# Patient Record
Sex: Female | Born: 1965 | Race: Black or African American | Hispanic: No | Marital: Married | Smoking: Never smoker
Health system: Southern US, Community
[De-identification: ages and names within clinical notes are randomized; demographics above are authoritative.]

## PROBLEM LIST (undated history)

## (undated) DIAGNOSIS — T4145XA Adverse effect of unspecified anesthetic, initial encounter: Secondary | ICD-10-CM

## (undated) DIAGNOSIS — K219 Gastro-esophageal reflux disease without esophagitis: Secondary | ICD-10-CM

## (undated) DIAGNOSIS — E041 Nontoxic single thyroid nodule: Secondary | ICD-10-CM

## (undated) DIAGNOSIS — D509 Iron deficiency anemia, unspecified: Secondary | ICD-10-CM

## (undated) DIAGNOSIS — D649 Anemia, unspecified: Secondary | ICD-10-CM

## (undated) DIAGNOSIS — N83209 Unspecified ovarian cyst, unspecified side: Secondary | ICD-10-CM

## (undated) DIAGNOSIS — E890 Postprocedural hypothyroidism: Secondary | ICD-10-CM

## (undated) DIAGNOSIS — N870 Mild cervical dysplasia: Secondary | ICD-10-CM

## (undated) DIAGNOSIS — T8859XA Other complications of anesthesia, initial encounter: Secondary | ICD-10-CM

## (undated) DIAGNOSIS — G44229 Chronic tension-type headache, not intractable: Secondary | ICD-10-CM

## (undated) DIAGNOSIS — F419 Anxiety disorder, unspecified: Secondary | ICD-10-CM

## (undated) DIAGNOSIS — G43909 Migraine, unspecified, not intractable, without status migrainosus: Secondary | ICD-10-CM

## (undated) DIAGNOSIS — R011 Cardiac murmur, unspecified: Secondary | ICD-10-CM

## (undated) DIAGNOSIS — K589 Irritable bowel syndrome without diarrhea: Secondary | ICD-10-CM

## (undated) DIAGNOSIS — D219 Benign neoplasm of connective and other soft tissue, unspecified: Secondary | ICD-10-CM

## (undated) HISTORY — PX: TUBAL LIGATION: SHX77

## (undated) HISTORY — DX: Gastro-esophageal reflux disease without esophagitis: K21.9

## (undated) HISTORY — PX: POLYPECTOMY: SHX149

## (undated) HISTORY — DX: Anemia, unspecified: D64.9

## (undated) HISTORY — DX: Anxiety disorder, unspecified: F41.9

## (undated) HISTORY — PX: NOVASURE ABLATION: SHX5394

## (undated) HISTORY — PX: ABDOMINAL HYSTERECTOMY: SHX81

## (undated) HISTORY — DX: Postprocedural hypothyroidism: E89.0

## (undated) HISTORY — PX: COLONOSCOPY: SHX174

## (undated) HISTORY — DX: Irritable bowel syndrome without diarrhea: K58.9

## (undated) HISTORY — PX: HYSTEROSCOPY: SHX211

## (undated) HISTORY — DX: Unspecified ovarian cyst, unspecified side: N83.209

## (undated) HISTORY — PX: UPPER GI ENDOSCOPY: SHX6162

## (undated) HISTORY — PX: LAPAROSCOPY: SHX197

## (undated) HISTORY — DX: Nontoxic single thyroid nodule: E04.1

## (undated) HISTORY — DX: Cardiac murmur, unspecified: R01.1

## (undated) HISTORY — DX: Benign neoplasm of connective and other soft tissue, unspecified: D21.9

## (undated) HISTORY — DX: Mild cervical dysplasia: N87.0

## (undated) HISTORY — DX: Chronic tension-type headache, not intractable: G44.229

## (undated) HISTORY — DX: Iron deficiency anemia, unspecified: D50.9

---

## 2003-01-26 ENCOUNTER — Emergency Department (HOSPITAL_COMMUNITY): Admission: EM | Admit: 2003-01-26 | Discharge: 2003-01-26 | Payer: Self-pay | Admitting: Emergency Medicine

## 2006-07-27 ENCOUNTER — Emergency Department (HOSPITAL_COMMUNITY): Admission: EM | Admit: 2006-07-27 | Discharge: 2006-07-27 | Payer: Self-pay | Admitting: Emergency Medicine

## 2006-08-07 ENCOUNTER — Ambulatory Visit (HOSPITAL_COMMUNITY): Admission: RE | Admit: 2006-08-07 | Discharge: 2006-08-07 | Payer: Self-pay | Admitting: Family Medicine

## 2006-08-20 ENCOUNTER — Encounter: Admission: RE | Admit: 2006-08-20 | Discharge: 2006-08-20 | Payer: Self-pay | Admitting: Family Medicine

## 2006-10-18 ENCOUNTER — Ambulatory Visit: Payer: Self-pay | Admitting: Gastroenterology

## 2006-10-21 ENCOUNTER — Ambulatory Visit: Payer: Self-pay | Admitting: Gastroenterology

## 2006-10-21 ENCOUNTER — Encounter (INDEPENDENT_AMBULATORY_CARE_PROVIDER_SITE_OTHER): Payer: Self-pay | Admitting: Specialist

## 2006-10-29 ENCOUNTER — Ambulatory Visit: Payer: Self-pay | Admitting: Cardiology

## 2006-12-18 ENCOUNTER — Ambulatory Visit: Payer: Self-pay | Admitting: Gastroenterology

## 2007-02-24 ENCOUNTER — Ambulatory Visit: Payer: Self-pay | Admitting: Gastroenterology

## 2008-01-03 DIAGNOSIS — K219 Gastro-esophageal reflux disease without esophagitis: Secondary | ICD-10-CM | POA: Insufficient documentation

## 2008-01-03 HISTORY — DX: Gastro-esophageal reflux disease without esophagitis: K21.9

## 2008-02-06 ENCOUNTER — Emergency Department (HOSPITAL_COMMUNITY): Admission: EM | Admit: 2008-02-06 | Discharge: 2008-02-06 | Payer: Self-pay | Admitting: Emergency Medicine

## 2008-03-08 ENCOUNTER — Emergency Department (HOSPITAL_COMMUNITY): Admission: EM | Admit: 2008-03-08 | Discharge: 2008-03-08 | Payer: Self-pay | Admitting: Emergency Medicine

## 2008-03-16 ENCOUNTER — Encounter: Admission: RE | Admit: 2008-03-16 | Discharge: 2008-03-16 | Payer: Self-pay | Admitting: Obstetrics and Gynecology

## 2008-07-01 ENCOUNTER — Emergency Department (HOSPITAL_COMMUNITY): Admission: EM | Admit: 2008-07-01 | Discharge: 2008-07-01 | Payer: Self-pay | Admitting: Family Medicine

## 2008-10-07 ENCOUNTER — Ambulatory Visit: Payer: Self-pay | Admitting: Gastroenterology

## 2008-10-15 ENCOUNTER — Telehealth: Payer: Self-pay | Admitting: Gastroenterology

## 2008-10-18 ENCOUNTER — Telehealth: Payer: Self-pay | Admitting: Gastroenterology

## 2008-11-03 ENCOUNTER — Telehealth: Payer: Self-pay | Admitting: Gastroenterology

## 2008-11-03 ENCOUNTER — Ambulatory Visit: Payer: Self-pay | Admitting: Gastroenterology

## 2008-11-12 ENCOUNTER — Telehealth: Payer: Self-pay | Admitting: Gastroenterology

## 2008-11-12 ENCOUNTER — Ambulatory Visit: Payer: Self-pay | Admitting: Gastroenterology

## 2008-11-16 LAB — CONVERTED CEMR LAB
Albumin: 3.4 g/dL — ABNORMAL LOW (ref 3.5–5.2)
Alkaline Phosphatase: 70 units/L (ref 39–117)
BUN: 12 mg/dL (ref 6–23)
Basophils Absolute: 0 10*3/uL (ref 0.0–0.1)
CO2: 28 meq/L (ref 19–32)
Eosinophils Relative: 4.5 % (ref 0.0–5.0)
GFR calc Af Amer: 101 mL/min
GFR calc non Af Amer: 83 mL/min
Lipase: 29 units/L (ref 11.0–59.0)
MCHC: 33.1 g/dL (ref 30.0–36.0)
Monocytes Relative: 8 % (ref 3.0–12.0)
Platelets: 411 10*3/uL — ABNORMAL HIGH (ref 150–400)
Potassium: 4 meq/L (ref 3.5–5.1)
RBC: 3.81 M/uL — ABNORMAL LOW (ref 3.87–5.11)
RDW: 14.6 % (ref 11.5–14.6)
TSH: 0.38 microintl units/mL (ref 0.35–5.50)
Total Bilirubin: 0.6 mg/dL (ref 0.3–1.2)

## 2008-11-17 ENCOUNTER — Ambulatory Visit: Payer: Self-pay | Admitting: Gastroenterology

## 2008-12-09 ENCOUNTER — Telehealth: Payer: Self-pay | Admitting: Gastroenterology

## 2008-12-23 ENCOUNTER — Ambulatory Visit: Payer: Self-pay | Admitting: Gastroenterology

## 2008-12-23 DIAGNOSIS — D509 Iron deficiency anemia, unspecified: Secondary | ICD-10-CM | POA: Insufficient documentation

## 2008-12-23 DIAGNOSIS — K589 Irritable bowel syndrome without diarrhea: Secondary | ICD-10-CM | POA: Insufficient documentation

## 2008-12-23 HISTORY — DX: Iron deficiency anemia, unspecified: D50.9

## 2008-12-23 HISTORY — DX: Irritable bowel syndrome, unspecified: K58.9

## 2009-01-04 ENCOUNTER — Ambulatory Visit: Payer: Self-pay | Admitting: Gastroenterology

## 2009-01-04 LAB — HM COLONOSCOPY

## 2009-01-13 ENCOUNTER — Ambulatory Visit: Payer: Self-pay | Admitting: Internal Medicine

## 2009-01-13 DIAGNOSIS — G44229 Chronic tension-type headache, not intractable: Secondary | ICD-10-CM | POA: Insufficient documentation

## 2009-01-13 HISTORY — DX: Chronic tension-type headache, not intractable: G44.229

## 2009-02-10 ENCOUNTER — Emergency Department (HOSPITAL_COMMUNITY): Admission: EM | Admit: 2009-02-10 | Discharge: 2009-02-10 | Payer: Self-pay | Admitting: Emergency Medicine

## 2009-02-17 ENCOUNTER — Ambulatory Visit: Payer: Self-pay | Admitting: Internal Medicine

## 2009-02-22 ENCOUNTER — Ambulatory Visit: Payer: Self-pay | Admitting: Gastroenterology

## 2009-02-23 LAB — CONVERTED CEMR LAB
Basophils Absolute: 0 10*3/uL (ref 0.0–0.1)
HCT: 31.1 % — ABNORMAL LOW (ref 36.0–46.0)
Hemoglobin: 10.3 g/dL — ABNORMAL LOW (ref 12.0–15.0)
Lymphocytes Relative: 49.8 % — ABNORMAL HIGH (ref 12.0–46.0)
MCV: 83.4 fL (ref 78.0–100.0)
Neutro Abs: 1.8 10*3/uL (ref 1.4–7.7)
Neutrophils Relative %: 33.3 % — ABNORMAL LOW (ref 43.0–77.0)
Platelets: 336 10*3/uL (ref 150.0–400.0)
RDW: 14.6 % (ref 11.5–14.6)

## 2009-03-01 ENCOUNTER — Encounter: Payer: Self-pay | Admitting: Internal Medicine

## 2009-03-02 ENCOUNTER — Telehealth: Payer: Self-pay | Admitting: Gastroenterology

## 2009-03-08 ENCOUNTER — Encounter: Admission: RE | Admit: 2009-03-08 | Discharge: 2009-03-08 | Payer: Self-pay | Admitting: Neurology

## 2009-03-15 LAB — CONVERTED CEMR LAB
Ferritin: 6.7 ng/mL — ABNORMAL LOW (ref 10.0–291.0)
Folate: 14.4 ng/mL
Saturation Ratios: 4.4 % — ABNORMAL LOW (ref 20.0–50.0)
Vitamin B-12: 369 pg/mL (ref 211–911)

## 2009-03-16 ENCOUNTER — Encounter: Admission: RE | Admit: 2009-03-16 | Discharge: 2009-03-16 | Payer: Self-pay | Admitting: Neurology

## 2009-03-16 ENCOUNTER — Encounter: Payer: Self-pay | Admitting: Internal Medicine

## 2009-03-17 ENCOUNTER — Encounter: Payer: Self-pay | Admitting: Internal Medicine

## 2009-03-22 ENCOUNTER — Ambulatory Visit (HOSPITAL_COMMUNITY): Admission: RE | Admit: 2009-03-22 | Discharge: 2009-03-22 | Payer: Self-pay | Admitting: Obstetrics and Gynecology

## 2009-04-07 ENCOUNTER — Encounter: Payer: Self-pay | Admitting: Internal Medicine

## 2009-04-13 ENCOUNTER — Emergency Department (HOSPITAL_COMMUNITY): Admission: EM | Admit: 2009-04-13 | Discharge: 2009-04-13 | Payer: Self-pay | Admitting: Family Medicine

## 2009-04-15 ENCOUNTER — Ambulatory Visit: Payer: Self-pay | Admitting: Internal Medicine

## 2009-04-21 ENCOUNTER — Ambulatory Visit: Payer: Self-pay | Admitting: Internal Medicine

## 2009-04-21 DIAGNOSIS — E041 Nontoxic single thyroid nodule: Secondary | ICD-10-CM | POA: Insufficient documentation

## 2009-04-21 HISTORY — DX: Nontoxic single thyroid nodule: E04.1

## 2009-04-21 LAB — CONVERTED CEMR LAB
Basophils Absolute: 0.3 10*3/uL — ABNORMAL HIGH (ref 0.0–0.1)
Basophils Relative: 3.9 % — ABNORMAL HIGH (ref 0.0–3.0)
Eosinophils Absolute: 0.3 10*3/uL (ref 0.0–0.7)
Folate: 11.6 ng/mL
HCT: 38.8 % (ref 36.0–46.0)
Hemoglobin: 12.5 g/dL (ref 12.0–15.0)
Iron: 41 ug/dL — ABNORMAL LOW (ref 42–145)
Lymphs Abs: 3.2 10*3/uL (ref 0.7–4.0)
MCV: 85.3 fL (ref 78.0–100.0)
Monocytes Absolute: 0.4 10*3/uL (ref 0.1–1.0)
Monocytes Relative: 5.6 % (ref 3.0–12.0)
Neutrophils Relative %: 39.8 % — ABNORMAL LOW (ref 43.0–77.0)
Platelets: 494 10*3/uL — ABNORMAL HIGH (ref 150.0–400.0)
T3 Uptake Ratio: 39.2 % — ABNORMAL HIGH (ref 22.5–37.0)
TSH: 0.31 microintl units/mL — ABNORMAL LOW (ref 0.35–5.50)
Vitamin B-12: 391 pg/mL (ref 211–911)
WBC: 7 10*3/uL (ref 4.5–10.5)

## 2009-04-23 ENCOUNTER — Encounter: Payer: Self-pay | Admitting: Internal Medicine

## 2009-04-26 ENCOUNTER — Ambulatory Visit (HOSPITAL_COMMUNITY): Admission: RE | Admit: 2009-04-26 | Discharge: 2009-04-26 | Payer: Self-pay | Admitting: Internal Medicine

## 2009-04-27 ENCOUNTER — Emergency Department (HOSPITAL_COMMUNITY): Admission: EM | Admit: 2009-04-27 | Discharge: 2009-04-27 | Payer: Self-pay | Admitting: Emergency Medicine

## 2009-04-27 ENCOUNTER — Telehealth: Payer: Self-pay | Admitting: Internal Medicine

## 2009-05-12 ENCOUNTER — Ambulatory Visit: Payer: Self-pay | Admitting: Internal Medicine

## 2009-05-17 ENCOUNTER — Emergency Department (HOSPITAL_COMMUNITY): Admission: EM | Admit: 2009-05-17 | Discharge: 2009-05-17 | Payer: Self-pay | Admitting: Emergency Medicine

## 2009-05-24 ENCOUNTER — Ambulatory Visit: Payer: Self-pay | Admitting: Endocrinology

## 2009-05-26 ENCOUNTER — Telehealth: Payer: Self-pay | Admitting: Internal Medicine

## 2009-05-26 ENCOUNTER — Telehealth: Payer: Self-pay | Admitting: Endocrinology

## 2009-05-27 ENCOUNTER — Telehealth: Payer: Self-pay | Admitting: Endocrinology

## 2009-05-31 ENCOUNTER — Ambulatory Visit: Payer: Self-pay | Admitting: Endocrinology

## 2009-06-06 ENCOUNTER — Encounter (HOSPITAL_COMMUNITY): Admission: RE | Admit: 2009-06-06 | Discharge: 2009-08-12 | Payer: Self-pay | Admitting: Endocrinology

## 2009-06-09 ENCOUNTER — Telehealth: Payer: Self-pay | Admitting: Endocrinology

## 2009-06-10 ENCOUNTER — Encounter (INDEPENDENT_AMBULATORY_CARE_PROVIDER_SITE_OTHER): Payer: Self-pay | Admitting: *Deleted

## 2009-06-20 ENCOUNTER — Telehealth: Payer: Self-pay | Admitting: Endocrinology

## 2009-06-23 ENCOUNTER — Ambulatory Visit: Payer: Self-pay | Admitting: Internal Medicine

## 2009-06-30 ENCOUNTER — Telehealth: Payer: Self-pay | Admitting: Endocrinology

## 2009-07-01 ENCOUNTER — Ambulatory Visit: Payer: Self-pay | Admitting: Endocrinology

## 2009-07-04 LAB — CONVERTED CEMR LAB
Free T4: 1.9 ng/dL — ABNORMAL HIGH (ref 0.6–1.6)
TSH: 0.06 microintl units/mL — ABNORMAL LOW (ref 0.35–5.50)

## 2009-07-26 ENCOUNTER — Encounter: Payer: Self-pay | Admitting: Internal Medicine

## 2009-08-12 ENCOUNTER — Ambulatory Visit: Payer: Self-pay | Admitting: Endocrinology

## 2009-08-12 DIAGNOSIS — E890 Postprocedural hypothyroidism: Secondary | ICD-10-CM

## 2009-08-12 DIAGNOSIS — E039 Hypothyroidism, unspecified: Secondary | ICD-10-CM | POA: Insufficient documentation

## 2009-08-12 HISTORY — DX: Postprocedural hypothyroidism: E89.0

## 2009-08-12 LAB — CONVERTED CEMR LAB: Free T4: 0.5 ng/dL — ABNORMAL LOW (ref 0.6–1.6)

## 2009-08-31 ENCOUNTER — Telehealth: Payer: Self-pay | Admitting: Endocrinology

## 2009-09-01 ENCOUNTER — Ambulatory Visit: Payer: Self-pay | Admitting: Endocrinology

## 2009-09-01 LAB — CONVERTED CEMR LAB
Free T4: 0.8 ng/dL (ref 0.6–1.6)
TSH: 3.81 microintl units/mL (ref 0.35–5.50)

## 2009-10-13 ENCOUNTER — Ambulatory Visit: Payer: Self-pay | Admitting: Endocrinology

## 2009-10-14 LAB — CONVERTED CEMR LAB: TSH: 1.57 microintl units/mL (ref 0.35–5.50)

## 2009-11-24 ENCOUNTER — Encounter: Admission: RE | Admit: 2009-11-24 | Discharge: 2009-11-24 | Payer: Self-pay | Admitting: Neurology

## 2009-12-15 ENCOUNTER — Ambulatory Visit: Payer: Self-pay | Admitting: Endocrinology

## 2009-12-29 ENCOUNTER — Ambulatory Visit: Payer: Self-pay | Admitting: Internal Medicine

## 2010-03-21 ENCOUNTER — Ambulatory Visit (HOSPITAL_COMMUNITY): Admission: RE | Admit: 2010-03-21 | Discharge: 2010-03-21 | Payer: Self-pay | Admitting: Obstetrics and Gynecology

## 2010-03-22 ENCOUNTER — Ambulatory Visit: Payer: Self-pay | Admitting: Gastroenterology

## 2010-03-23 ENCOUNTER — Telehealth: Payer: Self-pay | Admitting: Gastroenterology

## 2010-03-31 ENCOUNTER — Encounter: Payer: Self-pay | Admitting: Gastroenterology

## 2010-04-18 ENCOUNTER — Ambulatory Visit: Payer: Self-pay | Admitting: Endocrinology

## 2010-04-19 LAB — CONVERTED CEMR LAB: TSH: 1.17 microintl units/mL (ref 0.35–5.50)

## 2010-04-20 ENCOUNTER — Ambulatory Visit (HOSPITAL_COMMUNITY): Admission: RE | Admit: 2010-04-20 | Discharge: 2010-04-20 | Payer: Self-pay | Admitting: Obstetrics and Gynecology

## 2010-04-25 ENCOUNTER — Emergency Department (HOSPITAL_COMMUNITY): Admission: EM | Admit: 2010-04-25 | Discharge: 2010-04-25 | Payer: Self-pay | Admitting: Family Medicine

## 2010-07-05 ENCOUNTER — Encounter: Payer: Self-pay | Admitting: Internal Medicine

## 2010-07-07 ENCOUNTER — Ambulatory Visit: Payer: Self-pay | Admitting: Internal Medicine

## 2010-07-07 ENCOUNTER — Encounter: Payer: Self-pay | Admitting: Internal Medicine

## 2010-07-07 DIAGNOSIS — R011 Cardiac murmur, unspecified: Secondary | ICD-10-CM | POA: Insufficient documentation

## 2010-07-07 DIAGNOSIS — R55 Syncope and collapse: Secondary | ICD-10-CM | POA: Insufficient documentation

## 2010-07-07 HISTORY — DX: Cardiac murmur, unspecified: R01.1

## 2010-07-07 LAB — CONVERTED CEMR LAB
ALT: 14 units/L (ref 0–35)
AST: 20 units/L (ref 0–37)
Albumin: 3.6 g/dL (ref 3.5–5.2)
Alkaline Phosphatase: 67 units/L (ref 39–117)
BUN: 10 mg/dL (ref 6–23)
Basophils Absolute: 0 10*3/uL (ref 0.0–0.1)
Calcium: 9.2 mg/dL (ref 8.4–10.5)
Creatinine, Ser: 0.9 mg/dL (ref 0.4–1.2)
Eosinophils Relative: 4.7 % (ref 0.0–5.0)
HCT: 29.9 % — ABNORMAL LOW (ref 36.0–46.0)
Hemoglobin: 9.9 g/dL — ABNORMAL LOW (ref 12.0–15.0)
Iron: 20 ug/dL — ABNORMAL LOW (ref 42–145)
Lymphocytes Relative: 23.7 % (ref 12.0–46.0)
Monocytes Relative: 7.3 % (ref 3.0–12.0)
Neutro Abs: 3.2 10*3/uL (ref 1.4–7.7)
RBC: 3.66 M/uL — ABNORMAL LOW (ref 3.87–5.11)
RDW: 16.9 % — ABNORMAL HIGH (ref 11.5–14.6)
Total Bilirubin: 0.5 mg/dL (ref 0.3–1.2)
Transferrin: 390.6 mg/dL — ABNORMAL HIGH (ref 212.0–360.0)
WBC: 5 10*3/uL (ref 4.5–10.5)

## 2010-07-09 ENCOUNTER — Encounter: Payer: Self-pay | Admitting: Internal Medicine

## 2010-07-10 ENCOUNTER — Telehealth: Payer: Self-pay | Admitting: Internal Medicine

## 2010-07-18 ENCOUNTER — Encounter: Payer: Self-pay | Admitting: Internal Medicine

## 2010-07-18 ENCOUNTER — Ambulatory Visit: Payer: Self-pay

## 2010-07-18 ENCOUNTER — Ambulatory Visit: Payer: Self-pay | Admitting: Cardiology

## 2010-07-18 ENCOUNTER — Ambulatory Visit (HOSPITAL_COMMUNITY): Admission: RE | Admit: 2010-07-18 | Discharge: 2010-07-18 | Payer: Self-pay | Admitting: Internal Medicine

## 2010-07-24 ENCOUNTER — Telehealth: Payer: Self-pay | Admitting: Internal Medicine

## 2010-08-17 ENCOUNTER — Emergency Department (HOSPITAL_COMMUNITY)
Admission: EM | Admit: 2010-08-17 | Discharge: 2010-08-18 | Payer: Self-pay | Source: Home / Self Care | Admitting: Emergency Medicine

## 2010-09-30 ENCOUNTER — Encounter: Payer: Self-pay | Admitting: Endocrinology

## 2010-10-10 NOTE — Progress Notes (Signed)
Summary: speak to nuerse  Medications Added BENTYL 10 MG CAPS (DICYCLOMINE HCL) one capsule by mouth four times a day as needed       Phone Note Call from Patient Call back at Home Phone [phone]   Caller: Patient Call For: Russella Dar Reason for Call: Talk to Nurse Summary of Call: Patient wants to speak to nurse regarding meds Initial call taken by: Tawni Levy,  March 23, 2010 1:26 PM  Follow-up for Phone Call        Pt states she took one pill yesterday of Levbid and had this terrible heahache afterwards and she could not keep taking it b/c she states she already gets migraines. Pt would like to be switched to another antispasmodic. I know she has tried robinul in the past. please advise. Follow-up by: Christie Nottingham CMA Duncan Dull),  March 23, 2010 2:13 PM  Additional Follow-up for Phone Call Additional follow up Details #1::        Bentyl 10mg  qid as needed #100, 11 refills DC Levbid Additional Follow-up by: Meryl Dare MD Clementeen Graham,  March 23, 2010 2:17 PM    Additional Follow-up for Phone Call Additional follow up Details #2::    Rx was sent to pts pharmacy.  Follow-up by: Christie Nottingham CMA Duncan Dull),  March 23, 2010 2:33 PM  New/Updated Medications: BENTYL 10 MG CAPS (DICYCLOMINE HCL) one capsule by mouth four times a day as needed Prescriptions: BENTYL 10 MG CAPS (DICYCLOMINE HCL) one capsule by mouth four times a day as needed  #100 x 11   Entered by:   Christie Nottingham CMA (AAMA)   Authorized by:   Meryl Dare MD [facility]   Signed by:   Christie Nottingham CMA Duncan Dull) on 03/23/2010   Method used:   Electronically to        [facility]* (retail)       [address]       [address] [place] [address]mailing       [place], [place]  [address]       Ph: [phone]       Fax: [phone]   RxID:   [id number]

## 2010-10-10 NOTE — Progress Notes (Signed)
  Phone Note Call from Patient   Caller: Patient Summary of Call: pt called to get lab results.  Initial call taken by: Alysia Penna,  July 10, 2010 2:28 PM  Follow-up for Phone Call        called pt back & gave results.  Follow-up by: Alysia Penna,  July 10, 2010 2:28 PM

## 2010-10-10 NOTE — Assessment & Plan Note (Signed)
Summary: gerd/lk   History of Present Illness Visit Type: Follow-up Visit Primary GI MD: Elie Goody MD [facility] Primary Provider: Etta Grandchild MD Chief Complaint: GERD, nausea, some abdominal pain and gas x 2 weeks.   History of Present Illness:   Chelsey Hunt complains of worsening abdominal bloating with intermittent abdominal pains in her right and left abdomen, associated with nausea, regurgitation, and worsening reflux symptoms for the past few months. She states her constipation is under good control. Her symptoms are not necessarily related to meals or bowel movements. They tend to be less bothersome in the morning, and worsen as the day progresses.   GI Review of Systems    Reports abdominal pain, belching, bloating, heartburn, and  nausea.      Denies acid reflux, chest pain, dysphagia with liquids, dysphagia with solids, loss of appetite, vomiting, vomiting blood, weight loss, and  weight gain.      Reports black tarry stools.     Denies anal fissure, change in bowel habit, constipation, diarrhea, diverticulosis, fecal incontinence, heme positive stool, hemorrhoids, irritable bowel syndrome, jaundice, light color stool, liver problems, rectal bleeding, and  rectal pain.   Current Medications (verified): 1)  Kapidex 60 Mg Cpdr (Dexlansoprazole) .... One Tablet By Mouth Once Daily 2)  Vitamin B-12 500 Mcg Tabs (Cyanocobalamin) .... Once Daily 3)  Cvs Gas Relief Ultra Strength 180 Mg Caps (Simethicone) .... As Needed 4)  Feosol 200 (65 Fe) Mg Tabs (Ferrous Sulfate Dried) .... One By Mouth Three Times A Day With Meals 5)  Promethazine Hcl 25 Mg Tabs (Promethazine Hcl) .... Q4hrs As Needed For Nausea 6)  Zantac Otc 7)  Zyrtec Allergy 10 Mg Tabs (Cetirizine Hcl) .... As Needed Allergies 8)  Relpax 40 Mg Tabs (Eletriptan Hydrobromide) .Marland Kitchen.. 1 Tab By Mouth For Migraine, May Repeat in 2 Hours 9)  Imipramine Hcl 10 Mg Tabs (Imipramine Hcl) .Marland Kitchen.. 1 Tab By Mouth @ Bedtime May Increase By 1  Tab Every 5-7- Days Prn 10)  Alprazolam 0.25 Mg Tabs (Alprazolam) .Marland Kitchen.. 1 Q6h As Needed Thyroid Symptoms 11)  Estrogel 0.025 12)  Levothyroxine Sodium 75 Mcg Tabs (Levothyroxine Sodium) .Marland Kitchen.. 1 Qd 13)  Biotin .Marland Kitchen.. 2-3 Daily 14)  Vicodin  Allergies (verified): 1)  ! Darvocet 2)  ! * Topamax 3)  ! Relafen  Past History:  Past Medical History: Reviewed history from 12/23/2008 and no changes required. GERD Ovarian cyst Allergies Headaches IBS-C  Past Surgical History: Reviewed history from 11/03/2008 and no changes required. Tubal Ligation  Family History: Reviewed history from 05/24/2009 and no changes required. 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  Alcohol Use - no Illicit Drug Use - no Married Regular exercise-yes  Review of Systems       The patient complains of back pain, headaches-new, and shortness of breath.         The pertinent positives and negatives are noted as above and in the HPI. All other ROS were reviewed and were negative.   Vital Signs:  Patient profile:   45 year old female Height:      60 inches Weight:      130 pounds BMI:     25.48 Pulse rate:   96 / minute Pulse rhythm:   regular BP sitting:   108 / 72  (left arm)  Vitals Entered  By: Milford Cage NCMA (March 22, 2010 2:40 PM)  Physical Exam  General:  Well developed, well nourished, no acute distress. Head:  Normocephalic and atraumatic. Eyes:  PERRLA, no icterus. Ears:  Normal auditory acuity. Mouth:  No deformity or lesions, dentition normal. Lungs:  Clear throughout to auscultation. Heart:  Regular rate and rhythm; no murmurs, rubs,  or bruits. Abdomen:  Soft, nontender and nondistended. No masses, hepatosplenomegaly or hernias noted. Normal bowel sounds. Psych:  Alert and cooperative. Normal mood and  affect.  Impression & Recommendations:  Problem # 1:  IRRITABLE BOWEL SYNDROME (ICD-564.1) I suspect the majority of her symptoms are related to irritable bowel syndrome although she does not have typical changes associated with bowel movements. Begin Levbid twice daily as she felt the probe in all did not adequately address her symptoms. In the past. Begin low gas diet and increase the use of OTC gas relief medication to three times a day.  Problem # 2:  ACID REFLUX DISEASE (ICD-530.81) mild flare of GERD, likely related to irritable bowel syndrome activity. Reintense antireflux measures and continue on Dexilant.  Patient Instructions: 1)  Pick up your prescription from your pharmacy.  2)  Avoid foods high in acid content ( tomatoes, citrus juices, spicy foods) . Avoid eating within 3 to 4 hours of lying down or before exercising. Do not over eat; try smaller more frequent meals. Elevate head of bed four inches when sleeping.  3)  Excessive Gas Diet handout given.  4)  Copy sent to : Etta Grandchild, MD 5)  The medication list was reviewed and reconciled.  All changed / newly prescribed medications were explained.  A complete medication list was provided to the patient / caregiver.  Prescriptions: LEVBID 0.375 MG XR12H-TAB (HYOSCYAMINE SULFATE) one tablet by mouth two times a day  #60 x 11   Entered by:   Christie Nottingham CMA (AAMA)   Authorized by:   Meryl Dare MD [facility]   Signed by:   Christie Nottingham CMA Duncan Dull) on 03/22/2010   Method used:   Electronically to        [facility]* (retail)       [address]       [address] [place] [address]mail[address]       [place], [place]  [address]       Ph: [phone]       Fax: [phone]   RxID:   [id number]

## 2010-10-10 NOTE — Letter (Signed)
Summary: Results Follow-up Letter  [facility]  [address] [place], [place] [address]   Phone: [phone]  Fax: [phone]    07/09/2010  [address] [place], [place]  [address]  Dear Ms. Denny Peon,   The following are the results of your recent test(s):  Test     Result     CBC       anemia Iron level     low B12 level     normal Liver/kidney   normal Thyroid     normal   _________________________________________________________  Please call for an appointment soon _________________________________________________________ _________________________________________________________ _________________________________________________________  Sincerely,  Sanda Linger MD [facility]

## 2010-10-10 NOTE — Miscellaneous (Signed)
Summary: Appointment Canceled  Appointment status changed to canceled by [employer] on 07/17/2010 5:00 PM.  Cancellation Comments --------------------- echo/ heart mumur. pt has umr. gd  Appointment Information ----------------------- Appt Type:  CARDIOLOGY ANCILLARY VISIT      Date:  Tuesday, July 18, 2010      Time:  7:30 AM for 60 min   Urgency:  Routine   Made By:  Hoy Finlay Scheduler  To Visit:  [id number]    Reason:  echo/ heart mumur. pt has umr. gd  Appt Comments ------------- -- 07/17/10 17:00: ([facility]) CANCELED -- echo/ heart mumur. pt has umr. gd -- 07/07/10 9:54: ([facility]) BOOKED -- Routine CARDIOLOGY ANCILLARY VISIT at 07/18/2010 7:30 AM for 60 min echo/ heart mumur. pt has umr. gd

## 2010-10-10 NOTE — Assessment & Plan Note (Signed)
Summary: HEADACHE MD TOLD PT TO SEE PC PHY--LIGHTHEADNESS--STC   Vital Signs:  Patient profile:   45 year old female Height:      60 inches Weight:      136 pounds BMI:     26.66 O2 Sat:      97 % on Room air Temp:     98.1 degrees F oral Pulse rate:   80 / minute Pulse rhythm:   regular Resp:     16 per minute BP sitting:   120 / 80  (left arm) Cuff size:   large  Vitals Entered By: Rock Nephew CMA (July 07, 2010 8:12 AM)  Nutrition Counseling: Patient's BMI is greater than 25 and therefore counseled on weight management options.  O2 Flow:  Room air CC: syncope   Primary Care Erico Stan:  Etta Grandchild MD  CC:  syncope.  History of Present Illness: She returns for f/up and says that she had a syncopal episode 2 weeks ago and she says that she saw Dr. Vela Prose yesterday about her headaches and he sent her here for evaluation of the syncope. She was at work and felt hot so one  of the nurses checked her BP and she says that it was 120/80 and she then slid down to the floor but she knows that she was still conscious because she could here everyone talking and she did not have a post-ictal period. She says that she went home from work but that she was not seen by a doctor. She has not had anymore syncope or near-syncope since then. She has had mild nausea but no vomiting. She is dressed today to attend a funeral.  Preventive Screening-Counseling & Management  Alcohol-Tobacco     Alcohol drinks/day: 0     Alcohol Counseling: not indicated; patient does not drink     Smoking Status: never     Tobacco Counseling: not indicated; no tobacco use  Hep-HIV-STD-Contraception     Hepatitis Risk: no risk noted     HIV Risk: no risk noted     STD Risk: no risk noted      Sexual History:  currently monogamous.        Drug Use:  never.        Blood Transfusions:  no.    Medications Prior to Update: 1)  Dexilant 60 Mg Cpdr (Dexlansoprazole) .... One Capsule By Mouth Once  Daily 2)  Vitamin B-12 500 Mcg Tabs (Cyanocobalamin) .... Once Daily 3)  Cvs Gas Relief Ultra Strength 180 Mg Caps (Simethicone) .... As Needed 4)  Feosol 200 (65 Fe) Mg Tabs (Ferrous Sulfate Dried) .... One By Mouth Three Times A Day With Meals 5)  Promethazine Hcl 25 Mg Tabs (Promethazine Hcl) .... Q4hrs As Needed For Nausea 6)  Zantac Otc 7)  Zyrtec Allergy 10 Mg Tabs (Cetirizine Hcl) .... As Needed Allergies 8)  Relpax 40 Mg Tabs (Eletriptan Hydrobromide) .Marland Kitchen.. 1 Tab By Mouth For Migraine, May Repeat in 2 Hours 9)  Imipramine Hcl 10 Mg Tabs (Imipramine Hcl) .Marland Kitchen.. 1 Tab By Mouth @ Bedtime May Increase By 1 Tab Every 5-7- Days Prn 10)  Alprazolam 0.25 Mg Tabs (Alprazolam) .Marland Kitchen.. 1 Q6h As Needed Thyroid Symptoms 11)  Estrogel 0.025 12)  Levothyroxine Sodium 75 Mcg Tabs (Levothyroxine Sodium) .Marland Kitchen.. 1 Qd 13)  Biotin .Marland Kitchen.. 2-3 Daily 14)  Vicodin 15)  Bentyl 10 Mg Caps (Dicyclomine Hcl) .... One Capsule By Mouth Four Times A Day As Needed  Current Medications (verified):  1)  Dexilant 60 Mg Cpdr (Dexlansoprazole) .... One Capsule By Mouth Once Daily 2)  Vitamin B-12 500 Mcg Tabs (Cyanocobalamin) .... Once Daily 3)  Cvs Gas Relief Ultra Strength 180 Mg Caps (Simethicone) .... As Needed 4)  Feosol 200 (65 Fe) Mg Tabs (Ferrous Sulfate Dried) .... One By Mouth Three Times A Day With Meals 5)  Promethazine Hcl 25 Mg Tabs (Promethazine Hcl) .... Q4hrs As Needed For Nausea 6)  Zantac Otc 7)  Zyrtec Allergy 10 Mg Tabs (Cetirizine Hcl) .... As Needed Allergies 8)  Relpax 40 Mg Tabs (Eletriptan Hydrobromide) .Marland Kitchen.. 1 Tab By Mouth For Migraine, May Repeat in 2 Hours 9)  Imipramine Hcl 10 Mg Tabs (Imipramine Hcl) .Marland Kitchen.. 1 Tab By Mouth @ Bedtime May Increase By 1 Tab Every 5-7- Days Prn 10)  Alprazolam 0.25 Mg Tabs (Alprazolam) .Marland Kitchen.. 1 Q6h As Needed Thyroid Symptoms 11)  Estrogel 0.025 12)  Levothyroxine Sodium 75 Mcg Tabs (Levothyroxine Sodium) .Marland Kitchen.. 1 Qd 13)  Biotin .Marland Kitchen.. 2-3 Daily 14)  Vicodin 15)  Bentyl 10 Mg  Caps (Dicyclomine Hcl) .... One Capsule By Mouth Four Times A Day As Needed  Allergies (verified): 1)  ! Darvocet 2)  ! * Topamax 3)  ! 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  Alcohol Use - no Illicit Drug Use - no Married Regular exercise-yes  Risk Factors: Alcohol Use: 0 (07/07/2010) Exercise: yes (01/13/2009)  Risk Factors: Smoking Status: never (07/07/2010)  Family History: Reviewed history from 05/24/2009 and no changes required. 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  Alcohol Use - no Illicit Drug Use - no Married Regular exercise-yes  Review of Systems       The patient complains of syncope and headaches.  The patient denies anorexia, fever, weight loss, weight gain, vision loss, decreased hearing, chest pain, dyspnea on exertion, peripheral edema, prolonged cough, hemoptysis, abdominal pain, melena, hematochezia, severe indigestion/heartburn, hematuria, suspicious skin lesions, transient blindness, difficulty walking, depression, and enlarged lymph nodes.   CV:  Complains of fainting; denies bluish discoloration of lips or nails, chest pain or discomfort, difficulty breathing at night, difficulty breathing while lying down, fatigue, leg cramps with exertion, lightheadness, near fainting, palpitations, shortness of breath with exertion, swelling of feet, and weight gain. Neuro:  Complains of falling down and headaches; denies brief paralysis,  difficulty with concentration, disturbances in coordination, inability to speak, memory loss, numbness, poor balance, seizures, sensation of room spinning, tingling, tremors, visual disturbances, and weakness. Endo:  Denies cold intolerance, excessive hunger, excessive thirst, excessive urination, heat intolerance, polyuria, and weight change. Heme:  Denies abnormal bruising, bleeding, enlarge lymph nodes, fevers, pallor, and skin discoloration.  Physical Exam  General:  alert, well-developed, well-nourished, well-hydrated, appropriate dress, normal appearance, healthy-appearing, cooperative to examination, and good hygiene.   Head:  normocephalic, atraumatic, no abnormalities observed, and no abnormalities palpated.   Eyes:  vision grossly intact, pupils equal, pupils round, pupils reactive to light, pupils react to accomodation, no injection, and no nystagmus.   Ears:  R ear normal and L  ear normal.   Nose:  External nasal examination shows no deformity or inflammation. Nasal mucosa are pink and moist without lesions or exudates. Mouth:  Oral mucosa and oropharynx without lesions or exudates.  Teeth in good repair. Neck:  supple, full ROM, no masses, no thyromegaly, no thyroid nodules or tenderness, no JVD, normal carotid upstroke, and no cervical lymphadenopathy.   Lungs:  Normal respiratory effort, chest expands symmetrically. Lungs are clear to auscultation, no crackles or wheezes. Heart:  normal rate, regular rhythm, no gallop, no rub, and Grade  1/6 systolic ejection murmur over LLSB. Abdomen:  soft, non-tender, normal bowel sounds, no distention, no masses, no guarding, no rigidity, no rebound tenderness, no abdominal hernia, no inguinal hernia, no hepatomegaly, and no splenomegaly.   Msk:  normal ROM, no joint tenderness, no joint swelling, no joint warmth, no redness over joints, no joint deformities, no joint instability, and no crepitation.   Pulses:  R and L  carotid,radial,femoral,dorsalis pedis and posterior tibial pulses are full and equal bilaterally Extremities:  No clubbing, cyanosis, edema, or deformity noted with normal full range of motion of all joints.   Neurologic:  No cranial nerve deficits noted. Station and gait are normal. Plantar reflexes are down-going bilaterally. DTRs are symmetrical throughout. Sensory, motor and coordinative functions appear intact. Skin:  turgor normal, color normal, no rashes, no suspicious lesions, no ecchymoses, no petechiae, no purpura, no ulcerations, and no edema.   Cervical Nodes:  no anterior cervical adenopathy and no posterior cervical adenopathy.   Axillary Nodes:  no R axillary adenopathy and no L axillary adenopathy.   Inguinal Nodes:  no R inguinal adenopathy and no L inguinal adenopathy.   Psych:  Cognition and judgment appear intact. Alert and cooperative with normal attention span and concentration. No apparent delusions, illusions, hallucinations Additional Exam:  EKG is normal.   Impression & Recommendations:  Problem # 1:  HEART MURMUR, SYSTOLIC (ICD-785.2) Assessment New I will get an Echo done to see if she has any significant valvular disease Orders: Echo Referral (Echo) TLB-BMP (Basic Metabolic Panel-BMET) (80048-METABOL) TLB-CBC Platelet - w/Differential (85025-CBCD) TLB-Hepatic/Liver Function Pnl (80076-HEPATIC) TLB-TSH (Thyroid Stimulating Hormone) (84443-TSH) TLB-B12 + Folate Pnl ([id number]/HYQ) TLB-IBC Pnl (Iron/FE;Transferrin) (83550-IBC) T-D-Dimer Fibrin Derivatives Quantitive [phone]) EKG w/ Interpretation (93000)  Problem # 2:  SYNCOPE (ICD-780.2) Assessment: New as above, I think this was probably a benign event Orders: Echo Referral (Echo) TLB-BMP (Basic Metabolic Panel-BMET) (80048-METABOL) TLB-CBC Platelet - w/Differential (85025-CBCD) TLB-Hepatic/Liver Function Pnl (80076-HEPATIC) TLB-TSH (Thyroid Stimulating Hormone) (84443-TSH) TLB-B12 + Folate  Pnl ([id number]-N02/VOZ) TLB-IBC Pnl (Iron/FE;Transferrin) (83550-IBC) T-D-Dimer Fibrin Derivatives Quantitive ([phone]) EKG w/ Interpretation (93000)  Problem # 3:  HYPOTHYROIDISM, POST-RADIATION (ICD-244.1) Assessment: Unchanged  Her updated medication list for this problem includes:    Levothyroxine Sodium 75 Mcg Tabs (Levothyroxine sodium) .Marland Kitchen... 1 qd  Orders: TLB-BMP (Basic Metabolic Panel-BMET) (80048-METABOL) TLB-CBC Platelet - w/Differential (85025-CBCD) TLB-Hepatic/Liver Function Pnl (80076-HEPATIC) TLB-TSH (Thyroid Stimulating Hormone) (84443-TSH) TLB-B12 + Folate Pnl ([id number]/IRJ) TLB-IBC Pnl (Iron/FE;Transferrin) (83550-IBC) T-D-Dimer Fibrin Derivatives Quantitive [id number])  Problem # 4:  ANEMIA, IRON DEFICIENCY (ICD-280.9) Assessment: Unchanged  Her updated medication list for this problem includes:    Vitamin B-12 500 Mcg Tabs (Cyanocobalamin) ..... Once daily    Feosol 200 (65 Fe) Mg Tabs (Ferrous sulfate dried) ..... One by mouth three times a day with meals  Orders: TLB-BMP (Basic Metabolic Panel-BMET) (80048-METABOL) TLB-CBC Platelet - w/Differential (85025-CBCD) TLB-Hepatic/Liver Function Pnl (80076-HEPATIC) TLB-TSH (Thyroid Stimulating Hormone) (84443-TSH) TLB-B12 + Folate Pnl ([id number]/DUK) TLB-IBC Pnl (Iron/FE;Transferrin) (  83550-IBC) T-D-Dimer Fibrin Derivatives Quantitive [phone])  Complete Medication List: 1)  Dexilant 60 Mg Cpdr (Dexlansoprazole) .... One capsule by mouth once daily 2)  Vitamin B-12 500 Mcg Tabs (Cyanocobalamin) .... Once daily 3)  Cvs Gas Relief Ultra Strength 180 Mg Caps (Simethicone) .... As needed 4)  Feosol 200 (65 Fe) Mg Tabs (Ferrous sulfate dried) .... One by mouth three times a day with meals 5)  Promethazine Hcl 25 Mg Tabs (promethazine Hcl)  .... Q4hrs as needed for nausea 6)  Zantac Otc  7)  Zyrtec Allergy 10 Mg Tabs (Cetirizine hcl) .... As needed allergies 8)  Relpax 40 Mg Tabs (Eletriptan  hydrobromide) .Marland Kitchen.. 1 tab by mouth for migraine, may repeat in 2 hours 9)  Imipramine Hcl 10 Mg Tabs (Imipramine hcl) .Marland Kitchen.. 1 tab by mouth @ bedtime may increase by 1 tab every 5-7- days prn 10)  Alprazolam 0.25 Mg Tabs (Alprazolam) .Marland Kitchen.. 1 q6h as needed thyroid symptoms 11)  Estrogel 0.025  12)  Levothyroxine Sodium 75 Mcg Tabs (Levothyroxine sodium) .Marland Kitchen.. 1 qd 13)  Biotin  .Marland Kitchen.. 2-3 daily 14)  Vicodin  15)  Bentyl 10 Mg Caps (Dicyclomine hcl) .... One capsule by mouth four times a day as needed  Patient Instructions: 1)  Please schedule a follow-up appointment in 2 weeks. 2)  It is important that you exercise regularly at least 20 minutes 5 times a week. If you develop chest pain, have severe difficulty breathing, or feel very tired , stop exercising immediately and seek medical attention. 3)  You need to lose weight. Consider a lower calorie diet and regular exercise.    Orders Added: 1)  Echo Referral [Echo] 2)  TLB-BMP (Basic Metabolic Panel-BMET) [80048-METABOL] 3)  TLB-CBC Platelet - w/Differential [85025-CBCD] 4)  TLB-Hepatic/Liver Function Pnl [80076-HEPATIC] 5)  TLB-TSH (Thyroid Stimulating Hormone) [84443-TSH] 6)  TLB-B12 + Folate Pnl [[id number]-B12/FOL] 7)  TLB-IBC Pnl (Iron/FE;Transferrin) [83550-IBC] 8)  T-D-Dimer Fibrin Derivatives Quantitive [[id number]] 9)  EKG w/ Interpretation [93000] 10)  Est. Patient Level V [29562]

## 2010-10-10 NOTE — Assessment & Plan Note (Signed)
Summary: scractched foot with coat hanger/cd   Vital Signs:  Patient profile:   45 year old female Height:      60 inches (152.40 cm) Weight:      129.25 pounds (58.75 kg) BMI:     25.33 O2 Sat:      99 % on Room air Temp:     98.5 degrees F (36.94 degrees C) oral Pulse rate:   80 / minute Pulse rhythm:   regular BP sitting:   106 / 70  (left arm) Cuff size:   regular  Vitals Entered By: Brenton Grills (December 29, 2009 11:26 AM)  O2 Flow:  Room air CC: pt scratched foot on rusty coat hanger and is due for tetanus/aj   Primary Care Provider:  Etta Grandchild MD  CC:  pt scratched foot on rusty coat hanger and is due for tetanus/aj.  History of Present Illness: She returns c/o a scratch on the top of her left foot that occurred 2 days ago when a coat-hanger grazed the top of her foot. It is healing well but she needs a tetanus booster.  Preventive Screening-Counseling & Management  Alcohol-Tobacco     Alcohol drinks/day: 0     Smoking Status: never  Hep-HIV-STD-Contraception     Hepatitis Risk: no risk noted     HIV Risk: no risk noted     STD Risk: no risk noted      Sexual History:  currently monogamous.        Drug Use:  never.        Blood Transfusions:  no.    Medications Prior to Update: 1)  Kapidex 60 Mg Cpdr (Dexlansoprazole) .... One Tablet By Mouth Once Daily 2)  Vitamin B-12 500 Mcg Tabs (Cyanocobalamin) .... Once Daily 3)  Cvs Gas Relief Ultra Strength 180 Mg Caps (Simethicone) .... As Needed 4)  Feosol 200 (65 Fe) Mg Tabs (Ferrous Sulfate Dried) .... One By Mouth Three Times A Day With Meals 5)  Promethazine Hcl 25 Mg Tabs (Promethazine Hcl) .... Q4hrs As Needed For Nausea 6)  Zantac Otc 7)  Zyrtec Allergy 10 Mg Tabs (Cetirizine Hcl) .... As Needed Allergies 8)  Relpax 40 Mg Tabs (Eletriptan Hydrobromide) .Marland Kitchen.. 1 Tab By Mouth For Migraine, May Repeat in 2 Hours 9)  Imipramine Hcl 10 Mg Tabs (Imipramine Hcl) .Marland Kitchen.. 1 Tab By Mouth @ Bedtime May Increase By  1 Tab Every 5-7- Days Prn 10)  Alprazolam 0.25 Mg Tabs (Alprazolam) .Marland Kitchen.. 1 Q6h As Needed Thyroid Symptoms 11)  Estrogel 0.025 12)  Levothyroxine Sodium 75 Mcg Tabs (Levothyroxine Sodium) .Marland Kitchen.. 1 Qd 13)  Biotin .Marland Kitchen.. 2-3 Daily 14)  Vicodin  Current Medications (verified): 1)  Kapidex 60 Mg Cpdr (Dexlansoprazole) .... One Tablet By Mouth Once Daily 2)  Vitamin B-12 500 Mcg Tabs (Cyanocobalamin) .... Once Daily 3)  Cvs Gas Relief Ultra Strength 180 Mg Caps (Simethicone) .... As Needed 4)  Feosol 200 (65 Fe) Mg Tabs (Ferrous Sulfate Dried) .... One By Mouth Three Times A Day With Meals 5)  Promethazine Hcl 25 Mg Tabs (Promethazine Hcl) .... Q4hrs As Needed For Nausea 6)  Zantac Otc 7)  Zyrtec Allergy 10 Mg Tabs (Cetirizine Hcl) .... As Needed Allergies 8)  Relpax 40 Mg Tabs (Eletriptan Hydrobromide) .Marland Kitchen.. 1 Tab By Mouth For Migraine, May Repeat in 2 Hours 9)  Imipramine Hcl 10 Mg Tabs (Imipramine Hcl) .Marland Kitchen.. 1 Tab By Mouth @ Bedtime May Increase By 1 Tab Every 5-7- Days Prn 10)  Alprazolam 0.25 Mg Tabs (Alprazolam) .Marland Kitchen.. 1 Q6h As Needed Thyroid Symptoms 11)  Estrogel 0.025 12)  Levothyroxine Sodium 75 Mcg Tabs (Levothyroxine Sodium) .Marland Kitchen.. 1 Qd 13)  Biotin .Marland Kitchen.. 2-3 Daily 14)  Vicodin  Allergies (verified): 1)  ! Darvocet 2)  ! * Topamax 3)  ! Relafen  Past History:  Past Medical History: Reviewed history from 12/23/2008 and no changes required. GERD Ovarian cyst Allergies Headaches IBS-C  Past Surgical History: Reviewed history from 11/03/2008 and no changes required. Tubal Ligation  Family History: Reviewed history from 05/24/2009 and no changes required. 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  Alcohol Use - no Illicit Drug Use - no Married Regular exercise-yes Hepatitis Risk:  no risk noted HIV Risk:  no risk  noted STD Risk:  no risk noted Drug Use:  never Blood Transfusions:  no  Review of Systems  The patient denies fever, syncope, and abdominal pain.    Physical Exam  General:  alert, well-developed, well-nourished, well-hydrated, appropriate dress, normal appearance, healthy-appearing, cooperative to examination, and good hygiene.   Neck:  supple, full ROM, no masses, no thyroid nodules or tenderness, no JVD, normal carotid upstroke, no carotid bruits, and no cervical lymphadenopathy.   Lungs:  Normal respiratory effort, chest expands symmetrically. Lungs are clear to auscultation, no crackles or wheezes. Heart:  Normal rate and regular rhythm. S1 and S2 normal without gallop, murmur, click, rub or other extra sounds. Abdomen:  soft, non-tender, normal bowel sounds, no distention, no masses, no guarding, no rigidity, no rebound tenderness, no abdominal hernia, no inguinal hernia, no hepatomegaly, and no splenomegaly.   Msk:  the top of her let foot shows a 1.2 cm healed abrasion with a small amount of scab but no erythema, exudate, ttp, warmth. Pulses:  R and L carotid,radial,femoral,dorsalis pedis and posterior tibial pulses are full and equal bilaterally Inguinal Nodes:  no R inguinal adenopathy and no L inguinal adenopathy.     Impression & Recommendations:  Problem # 1:  ABRASION/FRICION BURN OTH MX&UNS SITE W/O INF (ICD-919.0) Assessment New  Complete Medication List: 1)  Kapidex 60 Mg Cpdr (Dexlansoprazole) .... One tablet by mouth once daily 2)  Vitamin B-12 500 Mcg Tabs (Cyanocobalamin) .... Once daily 3)  Cvs Gas Relief Ultra Strength 180 Mg Caps (Simethicone) .... As needed 4)  Feosol 200 (65 Fe) Mg Tabs (Ferrous sulfate dried) .... One by mouth three times a day with meals 5)  Promethazine Hcl 25 Mg Tabs (promethazine Hcl)  .... Q4hrs as needed for nausea 6)  Zantac Otc  7)  Zyrtec Allergy 10 Mg Tabs (Cetirizine hcl) .... As needed allergies 8)  Relpax 40 Mg Tabs  (Eletriptan hydrobromide) .Marland Kitchen.. 1 tab by mouth for migraine, may repeat in 2 hours 9)  Imipramine Hcl 10 Mg Tabs (Imipramine hcl) .Marland Kitchen.. 1 tab by mouth @ bedtime may increase by 1 tab every 5-7- days prn 10)  Alprazolam 0.25 Mg Tabs (Alprazolam) .Marland Kitchen.. 1 q6h as needed thyroid symptoms 11)  Estrogel 0.025  12)  Levothyroxine Sodium 75 Mcg Tabs (Levothyroxine sodium) .Marland Kitchen.. 1 qd 13)  Biotin  .Marland Kitchen.. 2-3 daily 14)  Vicodin   Other Orders: Tdap => 17yrs IM (47425) Admin 1st Vaccine (95638)  Patient Instructions: 1)  Please schedule a follow-up appointment as needed.   Immunizations Administered:  Tetanus Vaccine:  Vaccine Type: Tdap    Site: right deltoid    Mfr: GlaxoSmithKline    Dose: 0.5 ml    Route: IM    Given by: Brenton Grills    Exp. Date: 12/03/2011    Lot #: [id number]    VIS given: 07/29/07 version given December 29, 2009.

## 2010-10-10 NOTE — Letter (Signed)
Summary: [facility]  [facility]   Imported By: Sherian Rein 07/13/2010 08:52:46  _____________________________________________________________________  External Attachment:    Type:   Image     Comment:   External Document

## 2010-10-10 NOTE — Assessment & Plan Note (Signed)
Summary: 2 MONTH FOLLOW UP-LB   Vital Signs:  Patient profile:   45 year old female Height:      60 inches (152.40 cm) Weight:      131.13 pounds (59.60 kg) O2 Sat:      96 % on Room air Temp:     97.9 degrees F (36.61 degrees C) oral Pulse rate:   87 / minute BP sitting:   104 / 70  (left arm) Cuff size:   regular  Vitals Entered By: Josph Macho RMA (December 15, 2009 3:59 PM)  O2 Flow:  Room air CC: 2 month follow up/ CF   Referring Provider:  Dr Yetta Barre Primary Provider:  Etta Grandchild MD  CC:  2 month follow up/ CF.  History of Present Illness: pt is now 6 mos s/p i-131 rx for hyperthyroidism, due to a large adenoma.  she also has less frequent menstruation recently.  she takes synthroid 75 micrograms/day, as rx'ed.   Current Medications (verified): 1)  Kapidex 60 Mg Cpdr (Dexlansoprazole) .... One Tablet By Mouth Once Daily 2)  Vitamin B-12 500 Mcg Tabs (Cyanocobalamin) .... Once Daily 3)  Cvs Gas Relief Ultra Strength 180 Mg Caps (Simethicone) .... As Needed 4)  Feosol 200 (65 Fe) Mg Tabs (Ferrous Sulfate Dried) .... One By Mouth Three Times A Day With Meals 5)  Promethazine Hcl 25 Mg Tabs (Promethazine Hcl) .... Q4hrs As Needed For Nausea 6)  Zantac Otc 7)  Zyrtec Allergy 10 Mg Tabs (Cetirizine Hcl) .... As Needed Allergies 8)  Relpax 40 Mg Tabs (Eletriptan Hydrobromide) .Marland Kitchen.. 1 Tab By Mouth For Migraine, May Repeat in 2 Hours 9)  Imipramine Hcl 10 Mg Tabs (Imipramine Hcl) .Marland Kitchen.. 1 Tab By Mouth @ Bedtime May Increase By 1 Tab Every 5-7- Days Prn 10)  Alprazolam 0.25 Mg Tabs (Alprazolam) .Marland Kitchen.. 1 Q6h As Needed Thyroid Symptoms 11)  Estrogel 0.025 12)  Levothyroxine Sodium 75 Mcg Tabs (Levothyroxine Sodium) .Marland Kitchen.. 1 Qd 13)  Biotin .Marland Kitchen.. 2-3 Daily 14)  Vicodin  Allergies (verified): 1)  ! Darvocet 2)  ! * Topamax 3)  ! Relafen  Past History:  Past Medical History: Last updated: 12/23/2008 GERD Ovarian cyst Allergies Headaches IBS-C  Review of Systems   she has fatigue  Physical Exam  General:  normal appearance.   Neck:  no thyroid enlargement.  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  Medications Added to Medication List This Visit: 1)  Promethazine Hcl 25 Mg Tabs (promethazine Hcl)  .... Q4hrs as needed for nausea 2)  Vicodin   Other Orders: TLB-TSH (Thyroid Stimulating Hormone) (84443-TSH) Est. Patient Level III ([address]109)  Patient Instructions: 1)  tests are being ordered for you today.  a few days after the test(s), please call [phone] to hear your test results. 2)  pending the test results, continue same levothyroxine 3)  Please schedule a follow-up appointment in 4 months. 4)  (update: i left message on phone-tree:  rx as we discussed) 5)  (pt will be due for an ultrasound then). Prescriptions: LEVOTHYROXINE SODIUM 75 MCG TABS (LEVOTHYROXINE SODIUM) 1 qd  #30 Tablet x 11   Entered by:   Josph Macho RMA   Authorized  by:   Minus Breeding MD   Signed by:   Josph Macho RMA on 12/16/2009   Method used:   [facility]        [facility] [address] [phone][facility] (retail)       [address]       [place], [place]  [address]       Ph: [phone] or [phone]       Fax: [phone]   RxID:   [id number]

## 2010-10-10 NOTE — Progress Notes (Signed)
Summary: ECHO results  Phone Note Call from Patient Call back at Home Phone [phone]   Caller: Patient Summary of Call: Pt called requesting results of ECHO Initial call taken by: Margaret Pyle, CMA,  July 24, 2010 2:23 PM  Follow-up for Phone Call        normal Follow-up by: Etta Grandchild MD,  July 24, 2010 2:25 PM  Additional Follow-up for Phone Call Additional follow up Details #1::        Notified pt with results Additional Follow-up by: Orlan Leavens RMA,  July 24, 2010 4:10 PM

## 2010-10-10 NOTE — Miscellaneous (Signed)
Summary: Dexilant refill  Clinical Lists Changes  Medications: Changed medication from KAPIDEX 60 MG CPDR (DEXLANSOPRAZOLE) one tablet by mouth once daily to DEXILANT 60 MG CPDR (DEXLANSOPRAZOLE) one capsule by mouth once daily - Signed Rx of DEXILANT 60 MG CPDR (DEXLANSOPRAZOLE) one capsule by mouth once daily;  #30 x 11;  Signed;  Entered by: Christie Nottingham CMA (AAMA);  Authorized by: Meryl Dare MD Clementeen Graham;  Method used: Electronically to [facility] [address], [address] Shipping/mailing, [place], [place]  [address], Ph: [phone], Fax: [phone]    Prescriptions: DEXILANT 60 MG CPDR (DEXLANSOPRAZOLE) one capsule by mouth once daily  #30 x 11   Entered by:   Christie Nottingham CMA (AAMA)   Authorized by:   Meryl Dare MD [facility]   Signed by:   Christie Nottingham CMA Duncan Dull) on 03/31/2010   Method used:   Electronically to        [facility]* (retail)       [address]       [address]       [place], [place]  [address]       Ph: [phone]       Fax: [phone]   RxID:   [id number]

## 2010-10-10 NOTE — Assessment & Plan Note (Signed)
Summary: 6 wk rov /nws   Vital Signs:  Patient profile:   45 year old female Height:      60 inches (152.40 cm) Weight:      127 pounds (57.73 kg) O2 Sat:      99 % on Room air Temp:     97.1 degrees F (36.17 degrees C) oral Pulse rate:   105 / minute BP sitting:   118 / 78  (left arm) Cuff size:   regular  Vitals Entered By: Josph Macho CMA (October 13, 2009 2:39 PM)  O2 Flow:  Room air CC: 6 week follow up/ CF Is Patient Diabetic? No   Referring Provider:  Dr Yetta Barre Primary Provider:  Etta Grandchild MD  CC:  6 week follow up/ CF.  History of Present Illness: pt is now 4 mos s/p i-131 rx for hyperthyroidism due to a hyperfunctioning adenoma.   she takes synthroid 75 micrograms/day, as rx'ed.  pt states she feels well in general, except for fatigue.  Current Medications (verified): 1)  Kapidex 60 Mg Cpdr (Dexlansoprazole) .... One Tablet By Mouth Once Daily 2)  Vitamin B-12 500 Mcg Tabs (Cyanocobalamin) .... Once Daily 3)  Cvs Gas Relief Ultra Strength 180 Mg Caps (Simethicone) .... As Needed 4)  Feosol 200 (65 Fe) Mg Tabs (Ferrous Sulfate Dried) .... One By Mouth Three Times A Day With Meals 5)  Promethazine Hcl 12.5 Mg Tabs (Promethazine Hcl) 6)  Zantac Otc 7)  Zyrtec Allergy 10 Mg Tabs (Cetirizine Hcl) .... As Needed Allergies 8)  Relpax 40 Mg Tabs (Eletriptan Hydrobromide) .Marland Kitchen.. 1 Tab By Mouth For Migraine, May Repeat in 2 Hours 9)  Imipramine Hcl 10 Mg Tabs (Imipramine Hcl) .Marland Kitchen.. 1 Tab By Mouth @ Bedtime May Increase By 1 Tab Every 5-7- Days Prn 10)  Alprazolam 0.25 Mg Tabs (Alprazolam) .Marland Kitchen.. 1 Q6h As Needed Thyroid Symptoms 11)  Estrogel 0.025 12)  Levothyroxine Sodium 75 Mcg Tabs (Levothyroxine Sodium) .Marland Kitchen.. 1 Qd 13)  Biotin .Marland Kitchen.. 2-3 Daily  Allergies (verified): 1)  ! Darvocet 2)  ! * Topamax 3)  ! Relafen  Past History:  Past Medical History: Last updated: 12/23/2008 GERD Ovarian cyst Allergies Headaches IBS-C  Review of Systems       she also  reports hair loss and weight gain.  Physical Exam  General:  normal appearance.   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 2-3 daily  Other Orders: TLB-TSH (Thyroid Stimulating Hormone) (84443-TSH) TLB-T4 (Thyrox), Free [phone]) Est. Patient Level III (01027)  Patient Instructions: 1)  tests are being ordered for you today.  a few days after the test(s), please call [phone] to hear your test results. 2)  pending the test results, continue same levothyroxine 3)  Please schedule a follow-up appointment in 2 months. 4)  (update: i left message on phone-tree:  rx as we discussed)

## 2010-10-10 NOTE — Assessment & Plan Note (Signed)
Summary: 4 MO ROV /NWS   Vital Signs:  Patient profile:   45 year old female Height:      60 inches (152.40 cm) Weight:      133.25 pounds (60.57 kg) BMI:     26.12 O2 Sat:      98 % on Room air Temp:     97.7 degrees F (36.50 degrees C) oral Pulse rate:   95 / minute BP sitting:   108 / 70  (left arm) Cuff size:   regular  Vitals Entered By: Brenton Grills MA (April 18, 2010 4:27 PM)  O2 Flow:  Room air CC: 4 mo F/u/aj   Referring Jayan Raymundo:  Dr Yetta Barre Primary [facility]:  Etta Grandchild MD  CC:  4 mo F/u/aj.  History of Present Illness: pt is now 10 mos s/p i-131 rx for hyperthyroidism due to a hyperfunctioning nodule.  pt states she feels well in general.  she does not notice the nodule.    Current Medications (verified): 1)  Dexilant 60 Mg Cpdr (Dexlansoprazole) .... One Capsule By Mouth Once Daily 2)  Vitamin B-12 500 Mcg Tabs (Cyanocobalamin) .... Once Daily 3)  Cvs Gas Relief Ultra Strength 180 Mg Caps (Simethicone) .... As Needed 4)  Feosol 200 (65 Fe) Mg Tabs (Ferrous Sulfate Dried) .... One By Mouth Three Times A Day With Meals 5)  Promethazine Hcl 25 Mg Tabs (Promethazine Hcl) .... Q4hrs As Needed For Nausea 6)  Zantac Otc 7)  Zyrtec Allergy 10 Mg Tabs (Cetirizine Hcl) .... As Needed Allergies 8)  Relpax 40 Mg Tabs (Eletriptan Hydrobromide) .Marland Kitchen.. 1 Tab By Mouth For Migraine, May Repeat in 2 Hours 9)  Imipramine Hcl 10 Mg Tabs (Imipramine Hcl) .Marland Kitchen.. 1 Tab By Mouth @ Bedtime May Increase By 1 Tab Every 5-7- Days Prn 10)  Alprazolam 0.25 Mg Tabs (Alprazolam) .Marland Kitchen.. 1 Q6h As Needed Thyroid Symptoms 11)  Estrogel 0.025 12)  Levothyroxine Sodium 75 Mcg Tabs (Levothyroxine Sodium) .Marland Kitchen.. 1 Qd 13)  Biotin .Marland Kitchen.. 2-3 Daily 14)  Vicodin 15)  Bentyl 10 Mg Caps (Dicyclomine Hcl) .... One Capsule By Mouth Four Times A Day As Needed  Allergies (verified): 1)  ! Darvocet 2)  ! * Topamax 3)  ! Relafen  Past History:  Past Medical History: Last updated: 12/23/2008 GERD Ovarian  cyst Allergies Headaches IBS-C  Review of Systems       The patient complains of weight gain.    Physical Exam  General:  normal appearance.   Neck:  no thyroid enlargement.  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 Patient Level III (54098)  Patient Instructions: 1)  tests are being ordered for you today.  a few days after the test(s), please call [phone] to hear your test results. 2)  pending the test results, continue same levothyroxine 3)  Please schedule a follow-up appointment in 1 year. 4)  recheck thyroid ultrasound oct 2011.  you will be called with a day and time for an appointment

## 2010-11-14 ENCOUNTER — Other Ambulatory Visit: Payer: 59

## 2010-11-14 ENCOUNTER — Other Ambulatory Visit: Payer: Self-pay | Admitting: Endocrinology

## 2010-11-14 ENCOUNTER — Ambulatory Visit (INDEPENDENT_AMBULATORY_CARE_PROVIDER_SITE_OTHER): Payer: 59 | Admitting: Endocrinology

## 2010-11-14 ENCOUNTER — Encounter: Payer: Self-pay | Admitting: Endocrinology

## 2010-11-14 DIAGNOSIS — E041 Nontoxic single thyroid nodule: Secondary | ICD-10-CM

## 2010-11-14 DIAGNOSIS — E890 Postprocedural hypothyroidism: Secondary | ICD-10-CM

## 2010-11-14 LAB — TSH: TSH: 5.81 u[IU]/mL — ABNORMAL HIGH (ref 0.35–5.50)

## 2010-11-16 ENCOUNTER — Other Ambulatory Visit: Payer: Self-pay | Admitting: Endocrinology

## 2010-11-16 ENCOUNTER — Other Ambulatory Visit: Payer: Self-pay

## 2010-11-16 DIAGNOSIS — E041 Nontoxic single thyroid nodule: Secondary | ICD-10-CM

## 2010-11-20 LAB — COMPREHENSIVE METABOLIC PANEL
Albumin: 3.8 g/dL (ref 3.5–5.2)
BUN: 8 mg/dL (ref 6–23)
Calcium: 9.7 mg/dL (ref 8.4–10.5)
Creatinine, Ser: 1 mg/dL (ref 0.4–1.2)
Total Protein: 7.8 g/dL (ref 6.0–8.3)

## 2010-11-20 LAB — URINALYSIS, ROUTINE W REFLEX MICROSCOPIC
Bilirubin Urine: NEGATIVE
Hgb urine dipstick: NEGATIVE
Nitrite: NEGATIVE
Protein, ur: NEGATIVE mg/dL
Urobilinogen, UA: 0.2 mg/dL (ref 0.0–1.0)

## 2010-11-20 LAB — CBC
Hemoglobin: 10.6 g/dL — ABNORMAL LOW (ref 12.0–15.0)
RBC: 4.03 MIL/uL (ref 3.87–5.11)

## 2010-11-20 LAB — DIFFERENTIAL
Lymphocytes Relative: 44 % (ref 12–46)
Monocytes Absolute: 0.7 10*3/uL (ref 0.1–1.0)
Monocytes Relative: 8 % (ref 3–12)
Neutro Abs: 3.8 10*3/uL (ref 1.7–7.7)

## 2010-11-20 LAB — POCT PREGNANCY, URINE: Preg Test, Ur: NEGATIVE

## 2010-11-20 LAB — URINE CULTURE: Culture: NO GROWTH

## 2010-11-21 ENCOUNTER — Ambulatory Visit (HOSPITAL_COMMUNITY)
Admission: RE | Admit: 2010-11-21 | Discharge: 2010-11-21 | Disposition: A | Discharge status: Home / Self Care | Payer: 59 | Source: Ambulatory Visit | Attending: Endocrinology | Admitting: Endocrinology

## 2010-11-21 DIAGNOSIS — E041 Nontoxic single thyroid nodule: Secondary | ICD-10-CM | POA: Insufficient documentation

## 2010-11-21 NOTE — Assessment & Plan Note (Signed)
Summary: LIGHTHEADED / SOB/ FATIGUE Chelsey Hunt IT'S HER THYROID/NWS   Vital Signs:  Patient profile:   45 year old female Height:      60 inches (152.40 cm) Weight:      171 pounds (77.73 kg) BMI:     33.52 O2 Sat:      98 % on Room air Temp:     99.6 degrees F (37.56 degrees C) oral Pulse rate:   100 / minute Pulse rhythm:   regular BP sitting:   102 / 72  (left arm) Cuff size:   regular  Vitals Entered By: Brenton Grills CMA Duncan Dull) (November 14, 2010 10:31 AM)  O2 Flow:  Room air CC: Thyroid? (lightheadness, dizziness, hot flashes, fatigue since yesterday)/aj Is Patient Diabetic? No   Referring Provider:  Dr Yetta Barre Primary Provider:  Etta Grandchild MD  CC:  Thyroid? (lightheadness, dizziness, hot flashes, and fatigue since yesterday)/aj.  History of Present Illness: pt states a few days of dizziness sensation in the head, and assoc doe, nausea, fatigue, acral tingling, and constipation. pt had i-131 rx in 2010 for hyperthyroidism due to a hyperfunctioning adenoma.  Current Medications (verified): 1)  Dexilant 60 Mg Cpdr (Dexlansoprazole) .... One Capsule By Mouth Once Daily 2)  Vitamin B-12 500 Mcg Tabs (Cyanocobalamin) .... Once Daily 3)  Cvs Gas Relief Ultra Strength 180 Mg Caps (Simethicone) .... As Needed 4)  Feosol 200 (65 Fe) Mg Tabs (Ferrous Sulfate Dried) .... One By Mouth Three Times A Day With Meals 5)  Promethazine Hcl 25 Mg Tabs (Promethazine Hcl) .... Q4hrs As Needed For Nausea 6)  Zantac Otc 7)  Zyrtec Allergy 10 Mg Tabs (Cetirizine Hcl) .... As Needed Allergies 8)  Relpax 40 Mg Tabs (Eletriptan Hydrobromide) .Marland Kitchen.. 1 Tab By Mouth For Migraine, May Repeat in 2 Hours 9)  Imipramine Hcl 10 Mg Tabs (Imipramine Hcl) .Marland Kitchen.. 1 Tab By Mouth @ Bedtime May Increase By 1 Tab Every 5-7- Days Prn 10)  Alprazolam 0.25 Mg Tabs (Alprazolam) .Marland Kitchen.. 1 Q6h As Needed Thyroid Symptoms 11)  Levothyroxine Sodium 75 Mcg Tabs (Levothyroxine Sodium) .Marland Kitchen.. 1 Qd 12)  Bentyl 10 Mg Caps  (Dicyclomine Hcl) .... One Capsule By Mouth Four Times A Day As Needed 13)  Verapamil Hcl 120 Mg Tabs (Verapamil Hcl) .Marland Kitchen.. 1 Tablet By Mouth Two Times A Day 14)  Ibuprofen 600 Mg Tabs (Ibuprofen) .... As Needed 15)  Folic Acid 1 Mg Tabs (Folic Acid) .Marland Kitchen.. 1 Tablet By Mouth Once Daily  Allergies (verified): 1)  ! Darvocet 2)  ! * Topamax 3)  ! Relafen  Past History:  Past Medical History: Last updated: 12/23/2008 GERD Ovarian cyst Allergies Headaches IBS-C  Review of Systems       she has a low-grade temp, and a "hot," feeling.  no vomiting.  Physical Exam  General:  normal appearance.   Neck:  no thyroid enlargement.  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 1 tablet by mouth two times a day 2)  Ibuprofen 600 Mg Tabs (Ibuprofen) .... As needed 3)  Folic Acid 1 Mg Tabs (Folic acid) .Marland Kitchen.. 1 tablet by mouth once daily 4)  Levothyroxine Sodium 88 Mcg Tabs (Levothyroxine sodium) .Marland Kitchen.. 1 tab once daily  Other Orders: TLB-TSH (Thyroid Stimulating Hormone) ([id number]) Radiology Referral (Radiology) Est. Patient Level III (27253)  Patient Instructions: 1)  a thyroid blood test, and ultrasound, are being ordered for you today.  a few days after the test(s), please call [phone] to hear your test results. 2)  pending the test results, continue same levothyroxine. 3)  (update: i left message on phone-tree:  increase synthroid to 88 micrograms/d). Prescriptions: LEVOTHYROXINE SODIUM 88 MCG TABS (LEVOTHYROXINE SODIUM) 1 tab once daily  #30 x 11   Entered and Authorized by:   Minus Breeding MD    Signed by:   Minus Breeding MD on 11/14/2010   Method used:   [facility]        [facility] [address] [phone]* (retail)       [address]       [place], [place]  [address]       Ph: [phone] or [phone]       Fax: [phone]   RxID:   [id number]    Orders Added: 1)  TLB-TSH (Thyroid Stimulating Hormone) [[id number]] 2)  Radiology Referral [Radiology] 3)  Est. Patient Level III [61607]

## 2010-11-24 LAB — CBC
HCT: 32.5 % — ABNORMAL LOW (ref 36.0–46.0)
MCV: 82.2 fL (ref 78.0–100.0)
Platelets: 373 10*3/uL (ref 150–400)
RBC: 3.95 MIL/uL (ref 3.87–5.11)
WBC: 5.6 10*3/uL (ref 4.0–10.5)

## 2010-12-06 ENCOUNTER — Ambulatory Visit (HOSPITAL_COMMUNITY)
Admission: AD | Admit: 2010-12-06 | Discharge: 2010-12-06 | Disposition: A | Discharge status: Home / Self Care | Payer: 59 | Source: Ambulatory Visit | Attending: Neurology | Admitting: Neurology

## 2010-12-06 DIAGNOSIS — Z049 Encounter for examination and observation for unspecified reason: Secondary | ICD-10-CM | POA: Insufficient documentation

## 2010-12-14 LAB — HCG, SERUM, QUALITATIVE: Preg, Serum: NEGATIVE

## 2010-12-15 LAB — HEMOGLOBIN AND HEMATOCRIT, BLOOD
HCT: 33 % — ABNORMAL LOW (ref 36.0–46.0)
Hemoglobin: 10.9 g/dL — ABNORMAL LOW (ref 12.0–15.0)

## 2011-01-23 NOTE — Assessment & Plan Note (Signed)
 HEALTHCARE                         GASTROENTEROLOGY OFFICE NOTE   Chelsey Hunt                      MRN:          [id number]  DATE:02/24/2007                            DOB:          08/21/1966    [name] notes some mild burning pain in her right upper quadrant with  occasional heartburn and belching.  Her symptoms occur about twice a  week and they may last anywhere from a few minutes to a few hours at a  time.  Her upper endoscopy showed mild distal esophageal erythema and  the biopsy showed only mild inflammation.  She is currently taking  Nexium 40 mg twice a day.  She has not been taking it before meals.   CURRENT MEDICATIONS:  1. Nexium 40 mg p.o. b.i.d.  2. Tylenol p.r.n.  3. Ibuprofen p.r.n.   ALLERGIES:  DARVOCET.   PHYSICAL EXAMINATION:  GENERAL:  In no acute distress.  VITAL SIGNS:  Weight 117 pounds, blood pressure 86/64, pulse 76 and  regular.  She is not re-examined.   ASSESSMENT/PLAN:  1. Gastroesophageal reflux disease.  Symptoms under good, but not      excellent control.  Maintain standard antireflux measures with 4-      inch bed blocks and continue Nexium at 40 mg p.o. b.i.d.  A trial      of Gas-X four times a day p.r.n. for episodes of abdominal pain.      Plan for return office visit in 2 months.  2. Abnormal CT scan of the pelvis.  Left adnexal abnormalities were      noted which may represent an ovarian cyst.  I have advised her to      followup with a gynecologist and she states she will select a      gynecologist and have this further evaluated.     Chelsey Hunt. Russella Dar, MD, [facility]  Electronically Signed    MTS/MedQ  DD: 03/02/2007  DT: 03/03/2007  Job #: [id number]

## 2011-01-26 NOTE — Assessment & Plan Note (Signed)
[facility]                         GASTROENTEROLOGY OFFICE NOTE   Chelsey Hunt                      MRN:          [id number]  DATE:12/18/2006                            DOB:          1966/07/07    [name] returns complaining of intermittent problems with heartburn  that occurs intermittently during the day, she still has episodes of  nocturnal regurgitation that occur about once a week.  She does note the  Nexium has improved her mild constipation but it has not adequately  controlled her reflux symptoms.  Esophageal biopsies did show mild  inflammation.   EXAMINATION:  No acute distress.  Weight 117.8 pounds, blood pressure is  92/54, pulse 88 and regular.  She is not re-examined.   ASSESSMENT/PLAN:  Gastroesophageal reflux disease.  Symptoms not  adequately controlled.  Maintain all standard anti-reflux measures with  the use of 4 inch bed blocks.  Increase Nexium to 40 mg p.o. b.i.d.  Return office visit in approximately 6 weeks.  Consider a change to  another proton pump inhibitor if her symptoms do not come under adequate  control.  Will also consider a dual probe, 24 hour pH monitor if her  symtoms remain refractory.     Venita Lick. Russella Dar, MD, [facility]  Electronically Signed    MTS/MedQ  DD: 12/18/2006  DT: 12/18/2006  Job #: [id number]

## 2011-01-26 NOTE — Assessment & Plan Note (Signed)
[facility]                         GASTROENTEROLOGY OFFICE NOTE   Chelsey Hunt, Chelsey Hunt                      MRN:          [id number]  DATE:11/07/2006                            DOB:          1965-11-12    I have received medical records from [facility] from [place]. The records indicate that she had an upper  endoscopy by Dr. Sherian Maroon in [place], which showed grade I distal  esophagitis. A gastric emptying scan did not reveal any delay in  emptying. The records referred to a normal pH probe study and incomplete  relaxation of the lower esophageal sphincter on manometry but the actual  studies are not included. She was considered for Nissen fundoplication,  however, this was not recommended due to the findings on the pH study  and the esophageal manometry. A repeat manometry and pH probe was  suggested if her symptoms persisted.     Venita Lick. Russella Dar, MD, [facility]  Electronically Signed    MTS/MedQ  DD: 11/07/2006  DT: 11/07/2006  Job #: [id number]

## 2011-01-26 NOTE — Assessment & Plan Note (Signed)
[facility]                         GASTROENTEROLOGY OFFICE NOTE   Chelsey Hunt, Chelsey Hunt                      MRN:          [id number]  DATE:10/18/2006                            DOB:          1965-09-28    CHIEF COMPLAINT:  The patient is a 45 year old African-American female,  self-referred for evaluation of acid reflux and upper abdominal pain.   HISTORY OF PRESENT ILLNESS:  Chelsey Hunt relates long-term problems with  heartburn, frequent belching and postprandial indigestion.  She was  evaluated by Dr. Lorin Picket at [facility] in 1998 and 1999.  She  states she underwent an upper endoscopy, a gastric emptying scan and a  24-hour pH probe.  We will request the records.  She has been treated  intermittently for flares of reflux symptoms over the years with several  proton pump inhibitors.  She feels that Nexium has been the most  effective in controlling her symptoms.  Recently, she has been taking  Zantac on a p.r.n. basis.  She states that belching is a constant  problem for her.  She has had worsening problems with nausea and she has  episodic right upper quadrant pain and left upper quadrant pain that  radiates through to her back.  These symptoms last only a few minutes  and occur about once or twice a year.  She was last evaluated for these  symptoms at an urgent care center about two years ago and she states an  abdominal ultrasound was performed that was unremarkable.  Her symptoms  resolved with a GI cocktail.  She has no dysphagia, odynophagia, weight-  loss, vomiting, hematemesis, melena, hematochezia, change in bowel  habits or change in stool caliber.  Her weight is stable.  Her appetite  is decreased, secondary to nausea and reflux.  She has had intermittent  problems with constipation, lower abdominal bloating and occasional  loose stools when she is under stress.  These symptoms have not changed  and do not seem to occur in any  relationship to her upper abdominal  complaints.   FAMILY HISTORY:  Negative for colon cancer, colon polyps or inflammatory  bowel disease.   PAST MEDICAL HISTORY:  Allergies, headaches, GERD.   CURRENT MEDICATIONS:  1. Zantac daily.  2. Tylenol p.r.n.  3. Ibuprofen p.r.n.   MEDICATION ALLERGIES:  DARVOCET.   SOCIAL HISTORY:  Per the handwritten form.   REVIEW OF SYSTEMS:  Per the handwritten form.   PHYSICAL EXAM:  Well-developed, well-nourished, no acute distress.  Height 5 feet 4, weight 115.  Blood pressure is 100/60, pulse 72 and  regular.  HEENT EXAM:  Anicteric sclerae, oropharynx clear.  CHEST:  Clear to auscultation bilaterally.  CARDIAC:  Regular rate and rhythm without murmurs appreciated.  ABDOMEN:  Soft, nontender, nondistended.  Normoactive bowel sounds, no  palpable organomegaly, masses or hernias.  EXTREMITIES:  Without clubbing, cyanosis or edema.  NEUROLOGIC:  Alert and oriented times three.  Grossly nonfocal.   ASSESSMENT AND PLAN:  Chronic GERD with episodic upper abdominal pain.  Rule out gastritis, duodenitis, ulcer disease, irritable bowel syndrome,  cholelithiasis.  Will obtain  records from Dr. Karma Greaser evaluation at  [facility].  We will obtain blood work, performed in October of  2007, from her primary position, Dr. Tally Joe.  Begin all standard  antireflux measures and Nexium 40 mg p.o. q.a.m.  Risks, benefits, and  alternatives to upper endoscopy with possible biopsy discussed with the  patient.  She consents to proceed.  It will be scheduled electively.  If  her symptoms persist, consider further evaluation with additional blood  work as indicated and an abdominal ultrasound.     Venita Lick. Russella Dar, MD, [facility]  Electronically Signed    MTS/MedQ  DD: 10/18/2006  DT: 10/18/2006  Job #: [id number]

## 2011-02-02 ENCOUNTER — Ambulatory Visit (INDEPENDENT_AMBULATORY_CARE_PROVIDER_SITE_OTHER): Payer: 59 | Admitting: Endocrinology

## 2011-02-02 ENCOUNTER — Other Ambulatory Visit (INDEPENDENT_AMBULATORY_CARE_PROVIDER_SITE_OTHER): Payer: 59

## 2011-02-02 ENCOUNTER — Telehealth: Payer: Self-pay

## 2011-02-02 ENCOUNTER — Encounter: Payer: Self-pay | Admitting: Endocrinology

## 2011-02-02 DIAGNOSIS — R202 Paresthesia of skin: Secondary | ICD-10-CM

## 2011-02-02 DIAGNOSIS — D509 Iron deficiency anemia, unspecified: Secondary | ICD-10-CM

## 2011-02-02 DIAGNOSIS — R209 Unspecified disturbances of skin sensation: Secondary | ICD-10-CM

## 2011-02-02 DIAGNOSIS — E890 Postprocedural hypothyroidism: Secondary | ICD-10-CM

## 2011-02-02 LAB — CBC WITH DIFFERENTIAL/PLATELET
Basophils Relative: 0.9 % (ref 0.0–3.0)
Eosinophils Absolute: 0.4 10*3/uL (ref 0.0–0.7)
Eosinophils Relative: 3.8 % (ref 0.0–5.0)
HCT: 31.1 % — ABNORMAL LOW (ref 36.0–46.0)
Lymphs Abs: 3.3 10*3/uL (ref 0.7–4.0)
MCHC: 32.5 g/dL (ref 30.0–36.0)
MCV: 75.8 fl — ABNORMAL LOW (ref 78.0–100.0)
Monocytes Absolute: 0.5 10*3/uL (ref 0.1–1.0)
Neutrophils Relative %: 55.7 % (ref 43.0–77.0)
RBC: 4.1 Mil/uL (ref 3.87–5.11)

## 2011-02-02 LAB — BASIC METABOLIC PANEL
BUN: 15 mg/dL (ref 6–23)
Calcium: 9.9 mg/dL (ref 8.4–10.5)
Creatinine, Ser: 1 mg/dL (ref 0.4–1.2)
GFR: 79.62 mL/min (ref >60.00)
Glucose, Bld: 90 mg/dL (ref 70–99)
Potassium: 4.5 mEq/L (ref 3.5–5.1)

## 2011-02-02 LAB — IBC PANEL: Iron: 19 ug/dL — ABNORMAL LOW (ref 42–145)

## 2011-02-02 LAB — VITAMIN B12: Vitamin B-12: 536 pg/mL (ref 211–911)

## 2011-02-02 NOTE — Telephone Encounter (Signed)
Pt advised of labs and stated that she scheduled and appt with SAE this afternoon.

## 2011-02-02 NOTE — Telephone Encounter (Signed)
Ok i ordered

## 2011-02-02 NOTE — Telephone Encounter (Signed)
Pt called stating she has been experiencing nervousness and fatigue x 1-2 weeks. Pt says she has not had her TSH check since her last medication increase 11/2010. Pt is requesting advisement, possible TSH labs. Please advise

## 2011-02-02 NOTE — Patient Instructions (Addendum)
blood tests are being ordered for you today.  please call [phone] to hear your test results.  You will be prompted to enter the 9-digit "MRN" number that appears at the top left of this page, followed by #.  Then you will hear the message. here are some tests for blood in the bowels.  please follow the instructions, and return to the lab downstairs Take miralax 2x a day, to help your bowels move. (update: i left message on phone-tree:  Take fe 1 tab bid)

## 2011-02-02 NOTE — Progress Notes (Signed)
  Subjective:    Patient ID: Chelsey Hunt, female    DOB: [birth year], 45 y.o.   MRN: [id number]  HPI Pt states 1 week of fatigue, tingling of the hands and face, and associated swelling of the face.  She has monthly menses, but they are light, due to endometrial ablation.  She does not take fe tabs, due to constip. Past Medical History  Diagnosis Date  . THYROID NODULE, RIGHT 04/21/2009  . 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 ANEMIA, IRON DEFICIENCY 12/23/2008  . Chronic tension type headache 01/13/2009  . ACID REFLUX DISEASE 01/03/2008  . Irritable bowel syndrome 12/23/2008  . HEART MURMUR, SYSTOLIC 07/07/2010  . Ovarian cyst     Past Surgical History  Procedure Date  . Tubal ligation     History   Social History  . Marital Status: Married    Spouse Name: N/A    Number of Children: N/A  . Years of Education: N/A   Occupational History  . Not on file.   Social History Main Topics  . Smoking status: Never Smoker   . Smokeless tobacco: Not on file  . Alcohol Use: No  . Drug Use: No  . Sexually Active:    Other Topics Concern  . Not on file   Social History Narrative   Regular exercise-yes    No current outpatient prescriptions on file prior to visit.    Allergies  Allergen Reactions  . Nabumetone     REACTION: GI upset  . Propoxyphene N-Acetaminophen   . Topiramate     REACTION: rash    Family History  Problem Relation Age of Onset  . Goiter Mother   . Graves' disease Sister   . Cancer Neg Hx     Neg FH Colon Cancer  . Goiter Other     Siblings 2    BP 122/78  Pulse 97  Temp(Src) 97.8 F (36.6 C) (Oral)  Ht 5' (1.524 m)  Wt 143 lb (64.864 kg)  BMI 27.93 kg/m2  SpO2 98%  LMP 01/14/2011    Review of Systems Denies palpitations and fever.  No brbpr.    Objective:   Physical Exam GENERAL: no distress Neck - No masses or thyromegaly or limitation in range of motion Ext: no edema Face: i do not notice any swelling.    Lab Results    Component Value Date   WBC 9.7 02/02/2011   HGB 10.1* 02/02/2011   HCT 31.1* 02/02/2011   MCV 75.8* 02/02/2011   PLT 425.0* 02/02/2011   Lab Results  Component Value Date   IRON 19* 02/02/2011   FERRITIN 6.7* 11/17/2008      Assessment & Plan:  fe-deficiency anemia, persistent Constip, due to fe tabs Facial sxs, uncertain if related to anemia.

## 2011-03-22 ENCOUNTER — Other Ambulatory Visit (HOSPITAL_COMMUNITY): Payer: Self-pay | Admitting: Obstetrics and Gynecology

## 2011-03-22 DIAGNOSIS — Z1231 Encounter for screening mammogram for malignant neoplasm of breast: Secondary | ICD-10-CM

## 2011-03-27 ENCOUNTER — Telehealth: Payer: Self-pay | Admitting: Gastroenterology

## 2011-03-27 NOTE — Telephone Encounter (Signed)
Transferrered GI Care to Dr Devona Konig.

## 2011-03-30 ENCOUNTER — Ambulatory Visit (HOSPITAL_COMMUNITY)
Admission: RE | Admit: 2011-03-30 | Discharge: 2011-03-30 | Disposition: A | Discharge status: Home / Self Care | Payer: 59 | Source: Ambulatory Visit | Attending: Obstetrics and Gynecology | Admitting: Obstetrics and Gynecology

## 2011-03-30 DIAGNOSIS — Z1231 Encounter for screening mammogram for malignant neoplasm of breast: Secondary | ICD-10-CM | POA: Insufficient documentation

## 2011-04-03 ENCOUNTER — Other Ambulatory Visit: Payer: Self-pay | Admitting: Obstetrics and Gynecology

## 2011-04-03 DIAGNOSIS — R928 Other abnormal and inconclusive findings on diagnostic imaging of breast: Secondary | ICD-10-CM

## 2011-04-05 ENCOUNTER — Ambulatory Visit
Admission: RE | Admit: 2011-04-05 | Discharge: 2011-04-05 | Disposition: A | Discharge status: Home / Self Care | Payer: 59 | Source: Ambulatory Visit | Attending: Obstetrics and Gynecology | Admitting: Obstetrics and Gynecology

## 2011-04-05 DIAGNOSIS — R928 Other abnormal and inconclusive findings on diagnostic imaging of breast: Secondary | ICD-10-CM

## 2011-06-06 LAB — POCT I-STAT, CHEM 8
Calcium, Ion: 1.22
Chloride: 103
Glucose, Bld: 97
HCT: 39
TCO2: 26

## 2011-07-03 ENCOUNTER — Telehealth: Payer: Self-pay

## 2011-07-03 ENCOUNTER — Other Ambulatory Visit (INDEPENDENT_AMBULATORY_CARE_PROVIDER_SITE_OTHER): Payer: 59

## 2011-07-03 DIAGNOSIS — E890 Postprocedural hypothyroidism: Secondary | ICD-10-CM

## 2011-07-03 NOTE — Telephone Encounter (Signed)
Pt informed

## 2011-07-03 NOTE — Telephone Encounter (Signed)
Pt called stating she has been experiencing an increase in fatigue, tingling in extremities, headaches and sleepiness. Pt is requesting to have her TSH checked

## 2011-07-03 NOTE — Telephone Encounter (Signed)
i ordered

## 2011-07-26 ENCOUNTER — Ambulatory Visit (INDEPENDENT_AMBULATORY_CARE_PROVIDER_SITE_OTHER): Payer: 59 | Admitting: Internal Medicine

## 2011-07-26 ENCOUNTER — Encounter: Payer: Self-pay | Admitting: Internal Medicine

## 2011-07-26 DIAGNOSIS — K529 Noninfective gastroenteritis and colitis, unspecified: Secondary | ICD-10-CM

## 2011-07-26 DIAGNOSIS — G43909 Migraine, unspecified, not intractable, without status migrainosus: Secondary | ICD-10-CM

## 2011-07-26 DIAGNOSIS — K5289 Other specified noninfective gastroenteritis and colitis: Secondary | ICD-10-CM

## 2011-07-26 DIAGNOSIS — K219 Gastro-esophageal reflux disease without esophagitis: Secondary | ICD-10-CM

## 2011-07-26 NOTE — Patient Instructions (Signed)
You have had an episode of probable viral gastroenteritis, now near resolved, without dehydration today Please continue to drink plenty of fluids, and Continue all other medications as before

## 2011-07-29 ENCOUNTER — Encounter: Payer: Self-pay | Admitting: Internal Medicine

## 2011-07-29 DIAGNOSIS — G43909 Migraine, unspecified, not intractable, without status migrainosus: Secondary | ICD-10-CM | POA: Insufficient documentation

## 2011-07-29 DIAGNOSIS — K529 Noninfective gastroenteritis and colitis, unspecified: Secondary | ICD-10-CM | POA: Insufficient documentation

## 2011-07-29 NOTE — Assessment & Plan Note (Signed)
stable overall by hx and exam, most recent data reviewed with pt, and pt to continue medical treatment as before le Lab Results  Component Value Date   WBC 9.7 02/02/2011   HGB 10.1* 02/02/2011   HCT 31.1* 02/02/2011   PLT 425.0* 02/02/2011   GLUCOSE 90 02/02/2011   ALT 15 08/17/2010   AST 21 08/17/2010   NA 136 02/02/2011   K 4.5 02/02/2011   CL 104 02/02/2011   CREATININE 1.0 02/02/2011   BUN 15 02/02/2011   CO2 26 02/02/2011   TSH 1.70 07/03/2011

## 2011-07-29 NOTE — Assessment & Plan Note (Signed)
Mild to mod,   to f/u any worsening symptoms or concerns, for otc meds prn for now

## 2011-07-29 NOTE — Progress Notes (Signed)
Subjective:    Patient ID: Chelsey Hunt, female    DOB: [birth year], 45 y.o.   MRN: [id number]  HPI  Here with hx of 1 wk onset feverish, general weakness and malaise, nausea, worsening reflux, crampy abd pains, bloating and watery diarrhea, now improved in the last day since she made the appt;  Overall feels much improved, did miss 2 days of work;  Myalgias resolved today, no vomiting, blood or chills.  Has some mild dizzy but takes po well and Pt denies chest pain, increased sob or doe, wheezing, orthopnea, PND, increased LE swelling, palpitations, dizziness or syncope.  Pt denies new neurological symptoms such as new headache, or facial or extremity weakness or numbness, but has several migraine typical recently for her and controlled with otc meds.   Past Medical History  Diagnosis Date  . THYROID NODULE, RIGHT 04/21/2009  . 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 ANEMIA, IRON DEFICIENCY 12/23/2008  . Chronic tension type headache 01/13/2009  . ACID REFLUX DISEASE 01/03/2008  . Irritable bowel syndrome 12/23/2008  . HEART MURMUR, SYSTOLIC 07/07/2010  . Ovarian cyst    Past Surgical History  Procedure Date  . Tubal ligation     reports that she has never smoked. She does not have any smokeless tobacco history on file. She reports that she does not drink alcohol or use illicit drugs. family history includes Goiter in her mother and other and Graves' disease in her sister.  There is no history of Cancer. Allergies  Allergen Reactions  . Nabumetone     REACTION: GI upset  . Propoxyphene N-Acetaminophen   . Topiramate     REACTION: rash   Current Outpatient Prescriptions on File Prior to Visit  Medication Sig Dispense Refill  . ALPRAZolam (XANAX) 0.25 MG tablet 1 tablet every 6 hours as needed for thyroid symptoms       . cetirizine (ZYRTEC) 10 MG tablet 1 tablet as needed for allergies       . Cyanocobalamin (B-12) 500 MCG TABS Take 1 tablet by mouth daily.        Marland Kitchen dexlansoprazole  (DEXILANT) 60 MG capsule Take 60 mg by mouth daily.        Marland Kitchen dicyclomine (BENTYL) 10 MG capsule Take 10 mg by mouth 4 (four) times daily as needed.        . eletriptan (RELPAX) 40 MG tablet One tablet by mouth as needed for migraine headache.  If the headache improves and then returns, dose may be repeated after 2 hours have elapsed since first dose (do not exceed 80 mg per day). may repeat in 2 hours if necessary       . Ferrous Sulfate Dried (FEOSOL) 200 (65 FE) MG TABS Take 1 tablet by mouth 3 (three) times daily with meals.        . folic acid (FOLVITE) 1 MG tablet Take 1 mg by mouth daily.        Marland Kitchen ibuprofen (ADVIL,MOTRIN) 600 MG tablet as needed.        Marland Kitchen levothyroxine (SYNTHROID, LEVOTHROID) 88 MCG tablet Take 1 tablet (88 mcg total) by mouth daily.  30 tablet  11  . Magnesium 100 MG TABS Take 1 tablet by mouth 2 (two) times daily.        . promethazine (PHENERGAN) 25 MG tablet Take 25 mg by mouth every 4 (four) hours as needed. For nausea       . ranitidine (ZANTAC) 150 MG tablet Take 150 mg by mouth.        Marland Kitchen  Riboflavin (B-2) 100 MG TABS Take 1 tablet by mouth daily.        . Simethicone ([employer] GAS RELIEF ULTRA STRENGTH) 180 MG CAPS Take by mouth as needed.         Review of Systems Review of Systems  Constitutional: Negative for diaphoresis and unexpected weight change.  HENT: Negative for drooling and tinnitus.   Eyes: Negative for photophobia and visual disturbance.  Respiratory: Negative for choking and stridor.   Gastrointestinal: Negative for vomiting and blood in stool.  Genitourinary: Negative for hematuria and decreased urine volume.      Objective:   Physical Exam BP 114/84  Pulse 79  Temp(Src) 98.2 F (36.8 C) (Oral)  Ht 5' (1.524 m)  Wt 143 lb 6 oz (65.034 kg)  BMI 28.00 kg/m2  SpO2 97%  LMP 06/12/2011 Physical Exam  VS noted, not ill appearing Constitutional: Pt appears well-developed and well-nourished.  HENT: Head: Normocephalic.  Right Ear: External ear  normal.  Left Ear: External ear normal.  Bilat tm's mild erythema.  Sinus nontender.  Pharynx mild erythema Eyes: Conjunctivae and EOM are normal. Pupils are equal, round, and reactive to light.  Neck: Normal range of motion. Neck supple.  Cardiovascular: Normal rate and regular rhythm.   Pulmonary/Chest: Effort normal and breath sounds normal.  Abd:  Soft, NT, non-distended, + BS Neurological: Pt is alert. No cranial nerve deficit. motor/dtr/gait intact Skin: Skin is warm. No erythema.  Psychiatric: Pt behavior is normal. Thought content normal.     Assessment & Plan:

## 2011-07-29 NOTE — Assessment & Plan Note (Signed)
Improved, for work note, not orthostatic and takes po well, no further pain or diarrhea,  to f/u any worsening symptoms or concerns

## 2011-09-12 ENCOUNTER — Other Ambulatory Visit: Payer: Self-pay | Admitting: Obstetrics and Gynecology

## 2011-09-12 DIAGNOSIS — N63 Unspecified lump in unspecified breast: Secondary | ICD-10-CM

## 2011-09-26 ENCOUNTER — Ambulatory Visit
Admission: RE | Admit: 2011-09-26 | Discharge: 2011-09-26 | Disposition: A | Discharge status: Home / Self Care | Payer: 59 | Source: Ambulatory Visit | Attending: Obstetrics and Gynecology | Admitting: Obstetrics and Gynecology

## 2011-09-26 DIAGNOSIS — N63 Unspecified lump in unspecified breast: Secondary | ICD-10-CM

## 2011-11-23 ENCOUNTER — Other Ambulatory Visit: Payer: Self-pay | Admitting: Endocrinology

## 2012-02-21 ENCOUNTER — Other Ambulatory Visit: Payer: Self-pay | Admitting: Obstetrics and Gynecology

## 2012-02-21 DIAGNOSIS — N63 Unspecified lump in unspecified breast: Secondary | ICD-10-CM

## 2012-03-12 ENCOUNTER — Other Ambulatory Visit (INDEPENDENT_AMBULATORY_CARE_PROVIDER_SITE_OTHER): Payer: 59

## 2012-03-12 ENCOUNTER — Encounter: Payer: Self-pay | Admitting: Endocrinology

## 2012-03-12 ENCOUNTER — Ambulatory Visit (INDEPENDENT_AMBULATORY_CARE_PROVIDER_SITE_OTHER): Payer: 59 | Admitting: Endocrinology

## 2012-03-12 VITALS — BP 118/80 | HR 84 | Temp 97.8°F | Ht 60.0 in | Wt 134.0 lb

## 2012-03-12 DIAGNOSIS — E890 Postprocedural hypothyroidism: Secondary | ICD-10-CM

## 2012-03-12 NOTE — Patient Instructions (Addendum)
blood tests are being requested for you today.  You will receive a letter with results. You don't need to repeat the thyroid ultrasound for now.   Please return in 1 year.  most of the time, a "lumpy thyroid" will eventually become overactive again.  this is usually a slow process, happening over the span of many years.

## 2012-03-12 NOTE — Progress Notes (Signed)
  Subjective:    Patient ID: Chelsey Hunt, female    DOB: 1966-02-01, 46 y.o.   MRN: [id number]  HPI Pt had i-131 rx for hyperthyroidism due to multinodular goiter in 2010.  She has been on synthroid x a few years.  She does not notice the goiter.   Past Medical History  Diagnosis Date  . THYROID NODULE, RIGHT 04/21/2009  . 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 ANEMIA, IRON DEFICIENCY 12/23/2008  . Chronic tension type headache 01/13/2009  . ACID REFLUX DISEASE 01/03/2008  . Irritable bowel syndrome 12/23/2008  . HEART MURMUR, SYSTOLIC 07/07/2010  . Ovarian cyst     Past Surgical History  Procedure Date  . Tubal ligation     History   Social History  . Marital Status: Married    Spouse Name: N/A    Number of Children: N/A  . Years of Education: N/A   Occupational History  . Not on file.   Social History Main Topics  . Smoking status: Never Smoker   . Smokeless tobacco: Not on file  . Alcohol Use: No  . Drug Use: No  . Sexually Active:    Other Topics Concern  . Not on file   Social History Narrative   Regular exercise-yes   Review of Systems She had fatigue    Objective:   Physical Exam VITAL SIGNS:  See vs page GENERAL: no distress NECK: There is no palpable thyroid enlargement.  No thyroid nodule is palpable.  No palpable lymphadenopathy at the anterior neck.   Lab Results  Component Value Date   TSH 1.17 03/12/2012   T4TOTAL 5.7 04/21/2009      Assessment & Plan:  Post-i-131 hypothyroidism, well-replaced

## 2012-04-30 ENCOUNTER — Ambulatory Visit
Admission: RE | Admit: 2012-04-30 | Discharge: 2012-04-30 | Disposition: A | Discharge status: Home / Self Care | Payer: 59 | Source: Ambulatory Visit | Attending: Obstetrics and Gynecology | Admitting: Obstetrics and Gynecology

## 2012-04-30 DIAGNOSIS — N63 Unspecified lump in unspecified breast: Secondary | ICD-10-CM

## 2012-05-13 ENCOUNTER — Encounter: Payer: Self-pay | Admitting: Obstetrics and Gynecology

## 2012-05-19 ENCOUNTER — Encounter: Payer: Self-pay | Admitting: Obstetrics and Gynecology

## 2012-05-19 ENCOUNTER — Ambulatory Visit (INDEPENDENT_AMBULATORY_CARE_PROVIDER_SITE_OTHER): Payer: 59 | Admitting: Obstetrics and Gynecology

## 2012-05-19 VITALS — BP 102/60 | Ht 60.0 in | Wt 134.0 lb

## 2012-05-19 DIAGNOSIS — D649 Anemia, unspecified: Secondary | ICD-10-CM | POA: Insufficient documentation

## 2012-05-19 DIAGNOSIS — N926 Irregular menstruation, unspecified: Secondary | ICD-10-CM

## 2012-05-19 DIAGNOSIS — N870 Mild cervical dysplasia: Secondary | ICD-10-CM | POA: Insufficient documentation

## 2012-05-19 DIAGNOSIS — N83209 Unspecified ovarian cyst, unspecified side: Secondary | ICD-10-CM | POA: Insufficient documentation

## 2012-05-19 DIAGNOSIS — K219 Gastro-esophageal reflux disease without esophagitis: Secondary | ICD-10-CM | POA: Insufficient documentation

## 2012-05-19 LAB — THYROID PANEL WITH TSH
Free Thyroxine Index: 4.7 — ABNORMAL HIGH (ref 1.0–3.9)
T3 Uptake: 39.8 % — ABNORMAL HIGH (ref 22.5–37.0)
TSH: 0.122 u[IU]/mL — ABNORMAL LOW (ref 0.350–4.500)

## 2012-05-19 LAB — PROGESTERONE: Progesterone: 0.2 ng/mL

## 2012-05-19 NOTE — Progress Notes (Signed)
  Current contraception: tubal ligation. Hormone replacement therapy: No New medication: No  History of STD: trichomonas  History of infertility: no. History of abnormal Pap smear: yes - 2011 History of fibroids: Yes   Increased stress: Yes    Subjective:     Chelsey Hunt is a 46 y.o. woman,G3P3, who presents for irregular menses s/p Novasure 10/2010. Before July cycle lasted 14-16 days but very light. Has used menstrual week pathc that regulated to only 5 days of bleeding in the past.  Bleeding pattern:   Abnormal bleeding pattern started: started cycle 03/17/2012 and cycle lasted for 2 1/2 weeks.  Started cycle on 04/17/2012 and cycle lasted 2 1/2 weeks. Pt states cycle was heavier than usual. Was having to change pad more often. Some dysmenorrhea and minimal clots. C/O severe hot flashes and night sweats. Also moodiness and difficulty sleeping.   Denies any urinary tract symptoms, changes in bowel movements, nausea, vomiting or fever.   Current contraception: tubal ligation.  The following portions of the patient's history were reviewed and updated as appropriate: allergies, current medications, past family history, past medical history, past social history and past surgical history.  Review of Systems Pertinent items are noted in HPI.    Objective:    BP 102/60  Ht 5' (1.524 m)  Wt 134 lb (60.782 kg)  BMI 26.17 kg/m2  LMP 04/17/2012  Weight:  Wt Readings from Last 1 Encounters:  05/19/12 134 lb (60.782 kg)    BMI: Body mass index is 26.17 kg/(m^2).  General Appearance: Alert, appropriate appearance for age. No acute distress HEENT: Grossly normal Gastrointestinal: Soft, non-tender, no masses or organomegaly Pelvic Exam: Vulva and vagina appear normal. Bimanual exam reveals normal uterus and adnexa. Rectovaginal: not indicated      Assessment:   Dysfunctional uterine bleeding Menopausal symptoms     Plan:   Thyroid panel, FSH, Progesterone ( will call with  results) Climara 0.025 only on menstrual week for 7 days  Silverio Lay  MD 05/19/2012 9:10 AM

## 2012-05-20 ENCOUNTER — Telehealth: Payer: Self-pay | Admitting: Internal Medicine

## 2012-05-20 ENCOUNTER — Telehealth: Payer: Self-pay

## 2012-05-20 DIAGNOSIS — E890 Postprocedural hypothyroidism: Secondary | ICD-10-CM

## 2012-05-20 MED ORDER — LEVOTHYROXINE SODIUM 25 MCG PO TABS: 25.0000 ug | ORAL_TABLET | Freq: Every day | ORAL | Status: DC

## 2012-05-20 NOTE — Telephone Encounter (Signed)
Verified pharmacy.

## 2012-05-20 NOTE — Telephone Encounter (Signed)
Pt informed of MD's advisement, rx [facility] for Lev[facility] dosage.

## 2012-05-20 NOTE — Telephone Encounter (Signed)
Caller: [name]; Patient Name: Chelsey Hunt; PCP: Sanda Linger (Adults only); Best Callback Phone Number: [phone].  Patient calling about thyroid level.  States had blood work done by Dr. Cloretta Ned office and thyroid free index was 4.7 H and TSH was 0.122 L.  Was told to follow up with Dr. Everardo All regarding thyroid medication.  States takes daily of levothroxine.  Lab results are in Epic for Dr. Everardo All to review.  Info to office for provider review/consideration of med changes/ callback.   May reach patient [phone].   [facility].

## 2012-05-20 NOTE — Telephone Encounter (Signed)
Pt notified that she is not menopausal according to labs, but she does need to f/u with Dr Everardo All for thyroid management.  Pt agreeable.  ld

## 2012-05-20 NOTE — Telephone Encounter (Signed)
Reduce levothyroxine to 25 mcg/day Recheck blood test in 1 month.

## 2012-05-20 NOTE — Telephone Encounter (Signed)
Message copied by Larwance Rote on Tue May 20, 2012  2:17 PM ------      Message from: Silverio Lay      Created: Tue May 20, 2012 10:03 AM       Please call pt and inform labs confirm she is not menopausal but needs to see her endocrinologist Dr Everardo All for thyroid management. Please send results to Dr Everardo All.

## 2012-06-20 ENCOUNTER — Ambulatory Visit: Payer: 59 | Admitting: Endocrinology

## 2012-06-27 ENCOUNTER — Encounter: Payer: Self-pay | Admitting: Endocrinology

## 2012-06-27 ENCOUNTER — Ambulatory Visit (INDEPENDENT_AMBULATORY_CARE_PROVIDER_SITE_OTHER): Payer: 59 | Admitting: Endocrinology

## 2012-06-27 VITALS — BP 126/80 | HR 67 | Temp 98.0°F | Wt 136.0 lb

## 2012-06-27 DIAGNOSIS — E890 Postprocedural hypothyroidism: Secondary | ICD-10-CM

## 2012-06-27 NOTE — Patient Instructions (Addendum)
blood tests are being requested for you today.  You will be contacted with results.   Please return in 3-4 months. most of the time, a "lumpy thyroid" will eventually become overactive again.  this is usually a slow process, happening over the span of many years.

## 2012-06-27 NOTE — Progress Notes (Signed)
Subjective:    Patient ID: Chelsey Hunt, female    DOB: 04-30-1966, 46 y.o.   MRN: [id number]  HPI Pt had i-131 rx for hyperthyroidism due to multinodular goiter in 2010.  She has been on synthroid x a few years.  Last month, synthroid was reduced to 25 mcg/day.  She does not notice the goiter.  pt states she feels well in general, except for fatigue.   Past Medical History  Diagnosis Date  . THYROID NODULE, RIGHT 04/21/2009  . 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 ANEMIA, IRON DEFICIENCY 12/23/2008  . Chronic tension type headache 01/13/2009  . ACID REFLUX DISEASE 01/03/2008  . Irritable bowel syndrome 12/23/2008  . HEART MURMUR, SYSTOLIC 07/07/2010  . Ovarian cyst   . Dysplasia of cervix, low grade (CIN 1)   . Fibroid   . Anemia   . Ovarian cyst   . GERD (gastroesophageal reflux disease)     Past Surgical History  Procedure Date  . Tubal ligation   . Novasure ablation   . Hysteroscopy   . Polypectomy   . Laparoscopy     History   Social History  . Marital Status: Married    Spouse Name: N/A    Number of Children: N/A  . Years of Education: N/A   Occupational History  . Not on file.   Social History Main Topics  . Smoking status: Never Smoker   . Smokeless tobacco: Never Used  . Alcohol Use: No  . Drug Use: No  . Sexually Active: Yes    Birth Control/ Protection: Surgical     BTL   Other Topics Concern  . Not on file   Social History Narrative   Regular exercise-yes    Current Outpatient Prescriptions on File Prior to Visit  Medication Sig Dispense Refill  . Magnesium 100 MG TABS Take 1 tablet by mouth 2 (two) times daily.        . cetirizine (ZYRTEC) 10 MG tablet 1 tablet as needed for allergies       . Cyanocobalamin (B-12) 500 MCG TABS Take 1 tablet by mouth daily.        Marland Kitchen dexlansoprazole (DEXILANT) 60 MG capsule Take 60 mg by mouth daily.        Marland Kitchen dicyclomine (BENTYL) 10 MG capsule Take 10 mg by mouth 4 (four) times daily as needed.          . eletriptan (RELPAX) 40 MG tablet One tablet by mouth as needed for migraine headache.  If the headache improves and then returns, dose may be repeated after 2 hours have elapsed since first dose (do not exceed 80 mg per day). may repeat in 2 hours if necessary       . Ferrous Sulfate Dried (FEOSOL) 200 (65 FE) MG TABS Take 1 tablet by mouth 3 (three) times daily with meals.        . folic acid (FOLVITE) 1 MG tablet Take 1 mg by mouth daily.        Marland Kitchen ibuprofen (ADVIL,MOTRIN) 600 MG tablet as needed.        . promethazine (PHENERGAN) 25 MG tablet Take 25 mg by mouth every 4 (four) hours as needed. For nausea       . ranitidine (ZANTAC) 150 MG tablet Take 150 mg by mouth.        . Riboflavin (B-2) 100 MG TABS Take 1 tablet by mouth daily.        . Simethicone ([employer] GAS RELIEF ULTRA STRENGTH)  180 MG CAPS Take by mouth as needed.          Allergies  Allergen Reactions  . Nabumetone     REACTION: GI upset  . Propoxyphene-Acetaminophen   . Topiramate     REACTION: rash    Family History  Problem Relation Age of Onset  . Goiter Mother   . Heart disease Mother   . Thyroid disease Mother   . Graves' disease Sister   . Asthma Sister   . Goiter Other     Siblings 2  . Hypertension Paternal Grandfather   . Cancer Father    BP 126/80  Pulse 67  Temp 98 F (36.7 C) (Oral)  Wt 136 lb (61.689 kg)  SpO2 98%  Review of Systems Denies weight change    Objective:   Physical Exam VITAL SIGNS:  See vs page GENERAL: no distress NECK: There is no palpable thyroid enlargement.  No thyroid nodule is palpable.  No palpable lymphadenopathy at the anterior neck.  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

## 2012-06-28 LAB — TSH: TSH: 30.564 u[IU]/mL — ABNORMAL HIGH (ref 0.350–4.500)

## 2012-06-28 MED ORDER — LEVOTHYROXINE SODIUM 100 MCG PO TABS: 100.0000 ug | ORAL_TABLET | Freq: Every day | ORAL | Status: DC

## 2012-07-01 ENCOUNTER — Telehealth: Payer: Self-pay | Admitting: Endocrinology

## 2012-07-01 NOTE — Telephone Encounter (Signed)
Pt notified to continue medication

## 2012-07-01 NOTE — Telephone Encounter (Signed)
This is a coincidence.  Please continue the same medication.

## 2012-07-01 NOTE — Telephone Encounter (Signed)
Pt's meds were increased yesterday per Dr. Everardo All, but pt is now reporting severe nausea. Does she need to adjust back down or keep taking larger dose?

## 2012-07-07 ENCOUNTER — Telehealth: Payer: Self-pay | Admitting: Endocrinology

## 2012-07-07 DIAGNOSIS — E890 Postprocedural hypothyroidism: Secondary | ICD-10-CM

## 2012-07-07 NOTE — Telephone Encounter (Signed)
Pt is still feeling bad since her last appointment. She reports fatigue and headaches, as well as nausea. Please call back and advise.

## 2012-07-08 LAB — TSH: TSH: 7.41 u[IU]/mL — ABNORMAL HIGH (ref 0.350–4.500)

## 2012-07-08 NOTE — Telephone Encounter (Signed)
i don't think it is from the thyroid, but i would be happy to recheck the blood tests

## 2012-07-08 NOTE — Telephone Encounter (Signed)
Called pt and told her lab work is ordered for her at [facility] and she can come in any time. Pt understands and will come in today or tomorrow.

## 2012-07-08 NOTE — Telephone Encounter (Signed)
Called Chelsey Hunt and advised her per the note. Chelsey Hunt states the weekend after her OV on 10/18 she had swelling in her face, it has subsided. Chelsey Hunt sx have gotten worse. Chelsey Hunt is very fatigued, eyes feel real bad, headache, and eyes are black. Chelsey Hunt feels like it is no better even with the increase in her levothyroxine from 25 mcg to 100 mcg. Chelsey Hunt is willing to do more bloodwork to see what her levels are. Please advise accordingly.

## 2012-07-08 NOTE — Telephone Encounter (Signed)
Ok, i ordered.  Come in any time

## 2012-07-15 ENCOUNTER — Other Ambulatory Visit: Payer: Self-pay | Admitting: Endocrinology

## 2012-07-15 ENCOUNTER — Other Ambulatory Visit: Payer: Self-pay | Admitting: *Deleted

## 2012-07-15 MED ORDER — LEVOTHYROXINE SODIUM 100 MCG PO TABS: 100.0000 ug | ORAL_TABLET | Freq: Every day | ORAL | Status: DC

## 2012-07-15 NOTE — Telephone Encounter (Signed)
Medication refill request.

## 2012-07-30 ENCOUNTER — Ambulatory Visit (INDEPENDENT_AMBULATORY_CARE_PROVIDER_SITE_OTHER): Payer: 59 | Admitting: Internal Medicine

## 2012-07-30 ENCOUNTER — Other Ambulatory Visit (INDEPENDENT_AMBULATORY_CARE_PROVIDER_SITE_OTHER): Payer: 59

## 2012-07-30 ENCOUNTER — Encounter: Payer: Self-pay | Admitting: Internal Medicine

## 2012-07-30 VITALS — BP 110/76 | HR 71 | Temp 98.1°F | Resp 16 | Ht 60.0 in | Wt 133.0 lb

## 2012-07-30 DIAGNOSIS — Z0000 Encounter for general adult medical examination without abnormal findings: Secondary | ICD-10-CM | POA: Insufficient documentation

## 2012-07-30 DIAGNOSIS — D509 Iron deficiency anemia, unspecified: Secondary | ICD-10-CM

## 2012-07-30 DIAGNOSIS — D649 Anemia, unspecified: Secondary | ICD-10-CM

## 2012-07-30 DIAGNOSIS — E890 Postprocedural hypothyroidism: Secondary | ICD-10-CM

## 2012-07-30 LAB — LIPID PANEL
Cholesterol: 189 mg/dL (ref 0–200)
LDL Cholesterol: 122 mg/dL — ABNORMAL HIGH (ref 0–99)
Triglycerides: 53 mg/dL (ref 0.0–149.0)
VLDL: 10.6 mg/dL (ref 0.0–40.0)

## 2012-07-30 LAB — CBC WITH DIFFERENTIAL/PLATELET
Basophils Relative: 0.6 % (ref 0.0–3.0)
Eosinophils Relative: 4.6 % (ref 0.0–5.0)
HCT: 39.8 % (ref 36.0–46.0)
Lymphs Abs: 1.9 10*3/uL (ref 0.7–4.0)
MCV: 92.1 fl (ref 78.0–100.0)
Monocytes Absolute: 0.5 10*3/uL (ref 0.1–1.0)
RBC: 4.33 Mil/uL (ref 3.87–5.11)
WBC: 5.3 10*3/uL (ref 4.5–10.5)

## 2012-07-30 LAB — IBC PANEL: Saturation Ratios: 19.7 % — ABNORMAL LOW (ref 20.0–50.0)

## 2012-07-30 LAB — COMPREHENSIVE METABOLIC PANEL
Alkaline Phosphatase: 76 U/L (ref 39–117)
BUN: 12 mg/dL (ref 6–23)
Creatinine, Ser: 0.9 mg/dL (ref 0.4–1.2)
Glucose, Bld: 87 mg/dL (ref 70–99)
Total Bilirubin: 0.4 mg/dL (ref 0.3–1.2)

## 2012-07-30 LAB — VITAMIN B12: Vitamin B-12: 529 pg/mL (ref 211–911)

## 2012-07-30 NOTE — Progress Notes (Signed)
  Subjective:    Patient ID: Chelsey Hunt, female    DOB: 09-08-1966, 46 y.o.   MRN: [id number]  Anemia Presents for follow-up visit. Symptoms include malaise/fatigue. There has been no abdominal pain, anorexia, bruising/bleeding easily, confusion, fever, leg swelling, light-headedness, pallor, palpitations, paresthesias, pica or weight loss. Signs of blood loss that are not present include hematemesis, hematochezia, melena, menorrhagia and vaginal bleeding. Compliance problems include psychosocial issues.       Review of Systems  Constitutional: Positive for malaise/fatigue. Negative for fever and weight loss.  HENT: Negative.   Eyes: Negative.   Respiratory: Negative.   Cardiovascular: Negative for chest pain, palpitations and leg swelling.  Gastrointestinal: Negative.  Negative for abdominal pain, melena, hematochezia, anorexia and hematemesis.  Genitourinary: Negative.  Negative for vaginal bleeding and menorrhagia.  Musculoskeletal: Negative.   Skin: Negative.  Negative for pallor.  Neurological: Negative.  Negative for dizziness, weakness, light-headedness, numbness and paresthesias.  Hematological: Negative.  Negative for adenopathy. Does not bruise/bleed easily.  Psychiatric/Behavioral: Negative.  Negative for confusion.       Objective:   Physical Exam  Vitals reviewed. Constitutional: She is oriented to person, place, and time. She appears well-developed and well-nourished. No distress.  HENT:  Head: Normocephalic and atraumatic.  Mouth/Throat: Oropharynx is clear and moist. No oropharyngeal exudate.  Eyes: Conjunctivae normal are normal. Right eye exhibits no discharge. Left eye exhibits no discharge. No scleral icterus.  Neck: Normal range of motion. Neck supple. No JVD present. No tracheal deviation present. No thyromegaly present.  Cardiovascular: Normal rate, regular rhythm, normal heart sounds and intact distal pulses.  Exam reveals no gallop and no friction  rub.   No murmur heard. Pulmonary/Chest: Effort normal and breath sounds normal. No stridor. No respiratory distress. She has no wheezes. She has no rales. She exhibits no mass, no tenderness, no edema and no swelling. Right breast exhibits no inverted nipple, no mass, no nipple discharge, no skin change and no tenderness. Left breast exhibits no inverted nipple, no mass, no nipple discharge, no skin change and no tenderness. Breasts are symmetrical.  Abdominal: Soft. Bowel sounds are normal. She exhibits no distension and no mass. There is no tenderness. There is no rebound and no guarding.  Musculoskeletal: She exhibits no edema and no tenderness.  Lymphadenopathy:    She has no cervical adenopathy.  Neurological: She is oriented to person, place, and time. She displays normal reflexes. No cranial nerve deficit. She exhibits normal muscle tone. Coordination normal.  Skin: Skin is warm and dry. No rash noted. She is not diaphoretic. No erythema. No pallor.  Psychiatric: She has a normal mood and affect. Her behavior is normal. Judgment and thought content normal.      Lab Results  Component Value Date   WBC 9.7 02/02/2011   HGB 10.1* 02/02/2011   HCT 31.1* 02/02/2011   PLT 425.0* 02/02/2011   GLUCOSE 90 02/02/2011   ALT 15 08/17/2010   AST 21 08/17/2010   NA 136 02/02/2011   K 4.5 02/02/2011   CL 104 02/02/2011   CREATININE 1.0 02/02/2011   BUN 15 02/02/2011   CO2 26 02/02/2011   TSH 7.410* 07/08/2012      Assessment & Plan:

## 2012-07-30 NOTE — Assessment & Plan Note (Signed)
Exam done Vaccines were reviewed Labs ordered Pt ed material was given 

## 2012-07-30 NOTE — Assessment & Plan Note (Signed)
She has no s/s of blood loss, I will recheck her CBC and iron levels today

## 2012-07-30 NOTE — Patient Instructions (Signed)
Preventive Care for Adults, Female A healthy lifestyle and preventive care can promote health and wellness. Preventive health guidelines for women include the following key practices.  A routine yearly physical is a good way to check with your caregiver about your health and preventive screening. It is a chance to share any concerns and updates on your health, and to receive a thorough exam.  Visit your dentist for a routine exam and preventive care every 6 months. Brush your teeth twice a day and floss once a day. Good oral hygiene prevents tooth decay and gum disease.  The frequency of eye exams is based on your age, health, family medical history, use of contact lenses, and other factors. Follow your caregiver's recommendations for frequency of eye exams.  Eat a healthy diet. Foods like vegetables, fruits, whole grains, low-fat dairy products, and lean protein foods contain the nutrients you need without too many calories. Decrease your intake of foods high in solid fats, added sugars, and salt. Eat the right amount of calories for you.Get information about a proper diet from your caregiver, if necessary.  Regular physical exercise is one of the most important things you can do for your health. Most adults should get at least 150 minutes of moderate-intensity exercise (any activity that increases your heart rate and causes you to sweat) each week. In addition, most adults need muscle-strengthening exercises on 2 or more days a week.  Maintain a healthy weight. The body mass index (BMI) is a screening tool to identify possible weight problems. It provides an estimate of body fat based on height and weight. Your caregiver can help determine your BMI, and can help you achieve or maintain a healthy weight.For adults 20 years and older:  A BMI below 18.5 is considered underweight.  A BMI of 18.5 to 24.9 is normal.  A BMI of 25 to 29.9 is considered overweight.  A BMI of 30 and above is  considered obese.  Maintain normal blood lipids and cholesterol levels by exercising and minimizing your intake of saturated fat. Eat a balanced diet with plenty of fruit and vegetables. Blood tests for lipids and cholesterol should begin at age 20 and be repeated every 5 years. If your lipid or cholesterol levels are high, you are over 50, or you are at high risk for heart disease, you may need your cholesterol levels checked more frequently.Ongoing high lipid and cholesterol levels should be treated with medicines if diet and exercise are not effective.  If you smoke, find out from your caregiver how to quit. If you do not use tobacco, do not start.  If you are pregnant, do not drink alcohol. If you are breastfeeding, be very cautious about drinking alcohol. If you are not pregnant and choose to drink alcohol, do not exceed 1 drink per day. One drink is considered to be 12 ounces (355 mL) of beer, 5 ounces (148 mL) of wine, or 1.5 ounces (44 mL) of liquor.  Avoid use of street drugs. Do not share needles with anyone. Ask for help if you need support or instructions about stopping the use of drugs.  High blood pressure causes heart disease and increases the risk of stroke. Your blood pressure should be checked at least every 1 to 2 years. Ongoing high blood pressure should be treated with medicines if weight loss and exercise are not effective.  If you are 55 to 47 years old, ask your caregiver if you should take aspirin to prevent strokes.  Diabetes   screening involves taking a blood sample to check your fasting blood sugar level. This should be done once every 3 years, after age 45, if you are within normal weight and without risk factors for diabetes. Testing should be considered at a younger age or be carried out more frequently if you are overweight and have at least 1 risk factor for diabetes.  Breast cancer screening is essential preventive care for women. You should practice "breast  self-awareness." This means understanding the normal appearance and feel of your breasts and may include breast self-examination. Any changes detected, no matter how small, should be reported to a caregiver. Women in their 20s and 30s should have a clinical breast exam (CBE) by a caregiver as part of a regular health exam every 1 to 3 years. After age 40, women should have a CBE every year. Starting at age 40, women should consider having a mammography (breast X-ray test) every year. Women who have a family history of breast cancer should talk to their caregiver about genetic screening. Women at a high risk of breast cancer should talk to their caregivers about having magnetic resonance imaging (MRI) and a mammography every year.  The Pap test is a screening test for cervical cancer. A Pap test can show cell changes on the cervix that might become cervical cancer if left untreated. A Pap test is a procedure in which cells are obtained and examined from the lower end of the uterus (cervix).  Women should have a Pap test starting at age 21.  Between ages 21 and 29, Pap tests should be repeated every 2 years.  Beginning at age 30, you should have a Pap test every 3 years as long as the past 3 Pap tests have been normal.  Some women have medical problems that increase the chance of getting cervical cancer. Talk to your caregiver about these problems. It is especially important to talk to your caregiver if a new problem develops soon after your last Pap test. In these cases, your caregiver may recommend more frequent screening and Pap tests.  The above recommendations are the same for women who have or have not gotten the vaccine for human papillomavirus (HPV).  If you had a hysterectomy for a problem that was not cancer or a condition that could lead to cancer, then you no longer need Pap tests. Even if you no longer need a Pap test, a regular exam is a good idea to make sure no other problems are  starting.  If you are between ages 65 and 70, and you have had normal Pap tests going back 10 years, you no longer need Pap tests. Even if you no longer need a Pap test, a regular exam is a good idea to make sure no other problems are starting.  If you have had past treatment for cervical cancer or a condition that could lead to cancer, you need Pap tests and screening for cancer for at least 20 years after your treatment.  If Pap tests have been discontinued, risk factors (such as a new sexual partner) need to be reassessed to determine if screening should be resumed.  The HPV test is an additional test that may be used for cervical cancer screening. The HPV test looks for the virus that can cause the cell changes on the cervix. The cells collected during the Pap test can be tested for HPV. The HPV test could be used to screen women aged 30 years and older, and should   be used in women of any age who have unclear Pap test results. After the age of 30, women should have HPV testing at the same frequency as a Pap test.  Colorectal cancer can be detected and often prevented. Most routine colorectal cancer screening begins at the age of 50 and continues through age 75. However, your caregiver may recommend screening at an earlier age if you have risk factors for colon cancer. On a yearly basis, your caregiver may provide home test kits to check for hidden blood in the stool. Use of a small camera at the end of a tube, to directly examine the colon (sigmoidoscopy or colonoscopy), can detect the earliest forms of colorectal cancer. Talk to your caregiver about this at age 50, when routine screening begins. Direct examination of the colon should be repeated every 5 to 10 years through age 75, unless early forms of pre-cancerous polyps or small growths are found.  Hepatitis C blood testing is recommended for all people born from 1945 through 1965 and any individual with known risks for hepatitis C.  Practice  safe sex. Use condoms and avoid high-risk sexual practices to reduce the spread of sexually transmitted infections (STIs). STIs include gonorrhea, chlamydia, syphilis, trichomonas, herpes, HPV, and human immunodeficiency virus (HIV). Herpes, HIV, and HPV are viral illnesses that have no cure. They can result in disability, cancer, and death. Sexually active women aged 25 and younger should be checked for chlamydia. Older women with new or multiple partners should also be tested for chlamydia. Testing for other STIs is recommended if you are sexually active and at increased risk.  Osteoporosis is a disease in which the bones lose minerals and strength with aging. This can result in serious bone fractures. The risk of osteoporosis can be identified using a bone density scan. Women ages 65 and over and women at risk for fractures or osteoporosis should discuss screening with their caregivers. Ask your caregiver whether you should take a calcium supplement or vitamin D to reduce the rate of osteoporosis.  Menopause can be associated with physical symptoms and risks. Hormone replacement therapy is available to decrease symptoms and risks. You should talk to your caregiver about whether hormone replacement therapy is right for you.  Use sunscreen with sun protection factor (SPF) of 30 or more. Apply sunscreen liberally and repeatedly throughout the day. You should seek shade when your shadow is shorter than you. Protect yourself by wearing long sleeves, pants, a wide-brimmed hat, and sunglasses year round, whenever you are outdoors.  Once a month, do a whole body skin exam, using a mirror to look at the skin on your back. Notify your caregiver of new moles, moles that have irregular borders, moles that are larger than a pencil eraser, or moles that have changed in shape or color.  Stay current with required immunizations.  Influenza. You need a dose every fall (or winter). The composition of the flu vaccine  changes each year, so being vaccinated once is not enough.  Pneumococcal polysaccharide. You need 1 to 2 doses if you smoke cigarettes or if you have certain chronic medical conditions. You need 1 dose at age 65 (or older) if you have never been vaccinated.  Tetanus, diphtheria, pertussis (Tdap, Td). Get 1 dose of Tdap vaccine if you are younger than age 65, are over 65 and have contact with an infant, are a healthcare worker, are pregnant, or simply want to be protected from whooping cough. After that, you need a Td   booster dose every 10 years. Consult your caregiver if you have not had at least 3 tetanus and diphtheria-containing shots sometime in your life or have a deep or dirty wound.  HPV. You need this vaccine if you are a woman age 26 or younger. The vaccine is given in 3 doses over 6 months.  Measles, mumps, rubella (MMR). You need at least 1 dose of MMR if you were born in 1957 or later. You may also need a second dose.  Meningococcal. If you are age 19 to 21 and a first-year college student living in a residence hall, or have one of several medical conditions, you need to get vaccinated against meningococcal disease. You may also need additional booster doses.  Zoster (shingles). If you are age 60 or older, you should get this vaccine.  Varicella (chickenpox). If you have never had chickenpox or you were vaccinated but received only 1 dose, talk to your caregiver to find out if you need this vaccine.  Hepatitis A. You need this vaccine if you have a specific risk factor for hepatitis A virus infection or you simply wish to be protected from this disease. The vaccine is usually given as 2 doses, 6 to 18 months apart.  Hepatitis B. You need this vaccine if you have a specific risk factor for hepatitis B virus infection or you simply wish to be protected from this disease. The vaccine is given in 3 doses, usually over 6 months. Preventive Services / Frequency Ages 19 to 39  Blood  pressure check.** / Every 1 to 2 years.  Lipid and cholesterol check.** / Every 5 years beginning at age 20.  Clinical breast exam.** / Every 3 years for women in their 20s and 30s.  Pap test.** / Every 2 years from ages 21 through 29. Every 3 years starting at age 30 through age 65 or 70 with a history of 3 consecutive normal Pap tests.  HPV screening.** / Every 3 years from ages 30 through ages 65 to 70 with a history of 3 consecutive normal Pap tests.  Hepatitis C blood test.** / For any individual with known risks for hepatitis C.  Skin self-exam. / Monthly.  Influenza immunization.** / Every year.  Pneumococcal polysaccharide immunization.** / 1 to 2 doses if you smoke cigarettes or if you have certain chronic medical conditions.  Tetanus, diphtheria, pertussis (Tdap, Td) immunization. / A one-time dose of Tdap vaccine. After that, you need a Td booster dose every 10 years.  HPV immunization. / 3 doses over 6 months, if you are 26 and younger.  Measles, mumps, rubella (MMR) immunization. / You need at least 1 dose of MMR if you were born in 1957 or later. You may also need a second dose.  Meningococcal immunization. / 1 dose if you are age 19 to 21 and a first-year college student living in a residence hall, or have one of several medical conditions, you need to get vaccinated against meningococcal disease. You may also need additional booster doses.  Varicella immunization.** / Consult your caregiver.  Hepatitis A immunization.** / Consult your caregiver. 2 doses, 6 to 18 months apart.  Hepatitis B immunization.** / Consult your caregiver. 3 doses usually over 6 months. Ages 40 to 64  Blood pressure check.** / Every 1 to 2 years.  Lipid and cholesterol check.** / Every 5 years beginning at age 20.  Clinical breast exam.** / Every year after age 40.  Mammogram.** / Every year beginning at age 40   and continuing for as long as you are in good health. Consult with your  caregiver.  Pap test.** / Every 3 years starting at age 30 through age 65 or 70 with a history of 3 consecutive normal Pap tests.  HPV screening.** / Every 3 years from ages 30 through ages 65 to 70 with a history of 3 consecutive normal Pap tests.  Fecal occult blood test (FOBT) of stool. / Every year beginning at age 50 and continuing until age 75. You may not need to do this test if you get a colonoscopy every 10 years.  Flexible sigmoidoscopy or colonoscopy.** / Every 5 years for a flexible sigmoidoscopy or every 10 years for a colonoscopy beginning at age 50 and continuing until age 75.  Hepatitis C blood test.** / For all people born from 1945 through 1965 and any individual with known risks for hepatitis C.  Skin self-exam. / Monthly.  Influenza immunization.** / Every year.  Pneumococcal polysaccharide immunization.** / 1 to 2 doses if you smoke cigarettes or if you have certain chronic medical conditions.  Tetanus, diphtheria, pertussis (Tdap, Td) immunization.** / A one-time dose of Tdap vaccine. After that, you need a Td booster dose every 10 years.  Measles, mumps, rubella (MMR) immunization. / You need at least 1 dose of MMR if you were born in 1957 or later. You may also need a second dose.  Varicella immunization.** / Consult your caregiver.  Meningococcal immunization.** / Consult your caregiver.  Hepatitis A immunization.** / Consult your caregiver. 2 doses, 6 to 18 months apart.  Hepatitis B immunization.** / Consult your caregiver. 3 doses, usually over 6 months. Ages 65 and over  Blood pressure check.** / Every 1 to 2 years.  Lipid and cholesterol check.** / Every 5 years beginning at age 20.  Clinical breast exam.** / Every year after age 40.  Mammogram.** / Every year beginning at age 40 and continuing for as long as you are in good health. Consult with your caregiver.  Pap test.** / Every 3 years starting at age 30 through age 65 or 70 with a 3  consecutive normal Pap tests. Testing can be stopped between 65 and 70 with 3 consecutive normal Pap tests and no abnormal Pap or HPV tests in the past 10 years.  HPV screening.** / Every 3 years from ages 30 through ages 65 or 70 with a history of 3 consecutive normal Pap tests. Testing can be stopped between 65 and 70 with 3 consecutive normal Pap tests and no abnormal Pap or HPV tests in the past 10 years.  Fecal occult blood test (FOBT) of stool. / Every year beginning at age 50 and continuing until age 75. You may not need to do this test if you get a colonoscopy every 10 years.  Flexible sigmoidoscopy or colonoscopy.** / Every 5 years for a flexible sigmoidoscopy or every 10 years for a colonoscopy beginning at age 50 and continuing until age 75.  Hepatitis C blood test.** / For all people born from 1945 through 1965 and any individual with known risks for hepatitis C.  Osteoporosis screening.** / A one-time screening for women ages 65 and over and women at risk for fractures or osteoporosis.  Skin self-exam. / Monthly.  Influenza immunization.** / Every year.  Pneumococcal polysaccharide immunization.** / 1 dose at age 65 (or older) if you have never been vaccinated.  Tetanus, diphtheria, pertussis (Tdap, Td) immunization. / A one-time dose of Tdap vaccine if you are over   65 and have contact with an infant, are a healthcare worker, or simply want to be protected from whooping cough. After that, you need a Td booster dose every 10 years.  Varicella immunization.** / Consult your caregiver.  Meningococcal immunization.** / Consult your caregiver.  Hepatitis A immunization.** / Consult your caregiver. 2 doses, 6 to 18 months apart.  Hepatitis B immunization.** / Check with your caregiver. 3 doses, usually over 6 months. ** Family history and personal history of risk and conditions may change your caregiver's recommendations. Document Released: 10/23/2001 Document Revised: 11/19/2011  Document Reviewed: 01/22/2011 [employer] Patient Information 2013 [employer]. Iron Deficiency Anemia There are many types of anemia. Iron deficiency anemia is the most common. Iron deficiency anemia is a decrease in the number of red blood cells caused by too little iron. Without enough iron, your body does not produce enough hemoglobin. Hemoglobin is a substance in red blood cells that carries oxygen to the body's tissues. Iron deficiency anemia may leave you tired and short of breath. CAUSES   Lack of iron in the diet.  This may be seen in infants and children, because there is little iron in milk.  This may be seen in adults who do not eat enough iron-rich foods.  This may be seen in pregnant or breastfeeding women who do not take iron supplements. There is a much higher need for iron intake at these times.  Poor absorption of iron, as seen with intestinal disorders.  Intestinal bleeding.  Heavy periods. SYMPTOMS  Mild anemia may not be noticeable. Symptoms may include:  Fatigue.  Headache.  Pale skin.  Weakness.  Shortness of breath.  Dizziness.  Cold hands and feet.  Fast or irregular heartbeat. DIAGNOSIS  Diagnosis requires a thorough evaluation and physical exam by your caregiver.  Blood tests are generally used to confirm iron deficiency anemia.  Additional tests may be done to find the underlying cause of your anemia. These may include:  Testing for blood in the stool (fecal occult blood test).  A procedure to see inside the colon and rectum (colonoscopy).  A procedure to see inside the esophagus and stomach (endoscopy). TREATMENT   Correcting the cause of the iron deficiency is the first step.  Medicines, such as oral contraceptives, can make heavy menstrual flows lighter.  Antibiotics and other medicines can be used to treat peptic ulcers.  Surgery may be needed to remove a bleeding polyp, tumor, or fibroid.  Often, iron supplements (ferrous  sulfate) are taken.  For the best iron absorption, take these supplements with an empty stomach.  You may need to take the supplements with food if you cannot tolerate them on an empty stomach. Vitamin C improves the absorption of iron. Your caregiver may recommend taking your iron tablets with a glass of orange juice or vitamin C supplement.  Milk and antacids should not be taken at the same time as iron supplements. They may interfere with the absorption of iron.  Iron supplements can cause constipation. A stool softener is often recommended.  Pregnant and breastfeeding women will need to take extra iron, because their normal diet usually will not provide the required amount.  Patients who cannot tolerate iron by mouth can take it through a vein (intravenously) or by an injection into the muscle. HOME CARE INSTRUCTIONS   Ask your dietitian for help with diet questions.  Take iron and vitamins as directed by your caregiver.  Eat a diet rich in iron. Eat liver, lean beef, whole-grain   bread, eggs, dried fruit, and dark green leafy vegetables. SEEK IMMEDIATE MEDICAL CARE IF:   You have a fainting episode. Do not drive yourself. Call your local emergency services (911 in [place] if no other help is available.  You have chest pain, nausea, or vomiting.  You develop severe or increased shortness of breath with activities.  You develop weakness or increased thirst.  You have a rapid heartbeat.  You develop unexplained sweating or become lightheaded when getting up from a chair or bed. MAKE SURE YOU:   Understand these instructions.  Will watch your condition.  Will get help right away if you are not doing well or get worse. Document Released: 08/24/2000 Document Revised: 11/19/2011 Document Reviewed: 01/03/2010 [employer] Patient Information 2013 [employer].  

## 2012-07-30 NOTE — Assessment & Plan Note (Signed)
I will recheck her CBC and her vitamin levels 

## 2012-07-30 NOTE — Assessment & Plan Note (Signed)
Continue the same dose for now

## 2012-09-22 ENCOUNTER — Telehealth: Payer: Self-pay | Admitting: Endocrinology

## 2012-09-22 NOTE — Telephone Encounter (Signed)
Ov is not scheduled until 1 month from now.  If pt has sxs, please move-up appt

## 2012-09-22 NOTE — Telephone Encounter (Signed)
The patient feels that she needs her blood drawn to check thyroid levels.  Please put orders in if appropriate.  Patient may be reached at [phone] to discuss.

## 2012-09-23 ENCOUNTER — Ambulatory Visit (INDEPENDENT_AMBULATORY_CARE_PROVIDER_SITE_OTHER): Payer: 59 | Admitting: Endocrinology

## 2012-09-23 VITALS — BP 112/74 | HR 90 | Temp 97.8°F | Wt 132.0 lb

## 2012-09-23 DIAGNOSIS — E890 Postprocedural hypothyroidism: Secondary | ICD-10-CM

## 2012-09-23 NOTE — Telephone Encounter (Signed)
Patient scheduled to come in today,09/23/12, at 3 pm.

## 2012-09-23 NOTE — Progress Notes (Signed)
Subjective:    Patient ID: Chelsey Hunt, female    DOB: [birth year], 47 y.o.   MRN: [id number]  HPI Pt had i-131 rx for hyperthyroidism due to multinodular goiter in 2010.  She has been on synthroid x a few years.  A few months ago, synthroid was increased to 100 mcg/day.  Pt reports sxs of irregular menses, arthralgias, excessive diaphoresis, intermittent diarrhea, and nausea.   Past Medical History  Diagnosis Date  . THYROID NODULE, RIGHT 04/21/2009  . 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 ANEMIA, IRON DEFICIENCY 12/23/2008  . Chronic tension type headache 01/13/2009  . ACID REFLUX DISEASE 01/03/2008  . Irritable bowel syndrome 12/23/2008  . HEART MURMUR, SYSTOLIC 07/07/2010  . Ovarian cyst   . Dysplasia of cervix, low grade (CIN 1)   . Fibroid   . Anemia   . Ovarian cyst   . GERD (gastroesophageal reflux disease)     Past Surgical History  Procedure Date  . Tubal ligation   . Novasure ablation   . Hysteroscopy   . Polypectomy   . Laparoscopy     History   Social History  . Marital Status: Married    Spouse Name: N/A    Number of Children: N/A  . Years of Education: N/A   Occupational History  . Not on file.   Social History Main Topics  . Smoking status: Never Smoker   . Smokeless tobacco: Never Used  . Alcohol Use: No  . Drug Use: No  . Sexually Active: Yes    Birth Control/ Protection: Surgical     Comment: BTL   Other Topics Concern  . Not on file   Social History Narrative   Regular exercise-yes    Current Outpatient Prescriptions on File Prior to Visit  Medication Sig Dispense Refill  . cetirizine (ZYRTEC) 10 MG tablet 1 tablet as needed for allergies       . Cyanocobalamin (B-12) 500 MCG TABS Take 1 tablet by mouth daily.        Marland Kitchen dicyclomine (BENTYL) 10 MG capsule Take 10 mg by mouth 4 (four) times daily as needed.        . eletriptan (RELPAX) 40 MG tablet One tablet by mouth as needed for migraine headache.  If the headache improves and  then returns, dose may be repeated after 2 hours have elapsed since first dose (do not exceed 80 mg per day). may repeat in 2 hours if necessary       . Ferrous Sulfate Dried (FEOSOL) 200 (65 FE) MG TABS Take 1 tablet by mouth 3 (three) times daily with meals.        . folic acid (FOLVITE) 1 MG tablet Take 1 mg by mouth daily.        Marland Kitchen ibuprofen (ADVIL,MOTRIN) 600 MG tablet as needed.        . Magnesium 100 MG TABS Take 1 tablet by mouth 2 (two) times daily.        . pantoprazole (PROTONIX) 40 MG tablet Take 40 mg by mouth 2 (two) times daily.      . promethazine (PHENERGAN) 25 MG tablet Take 25 mg by mouth every 4 (four) hours as needed. For nausea       . ranitidine (ZANTAC) 150 MG tablet Take 150 mg by mouth.        . Riboflavin (B-2) 100 MG TABS Take 1 tablet by mouth daily.        . Simethicone ([employer] GAS RELIEF ULTRA STRENGTH) 180  MG CAPS Take by mouth as needed.          Allergies  Allergen Reactions  . Nabumetone     REACTION: GI upset  . Propoxyphene-Acetaminophen   . Topiramate     REACTION: rash    Family History  Problem Relation Age of Onset  . Goiter Mother   . Heart disease Mother   . Thyroid disease Mother   . Graves' disease Sister   . Asthma Sister   . Goiter Other     Siblings 2  . Hypertension Paternal Grandfather   . Cancer Father     BP 112/74  Pulse 90  Temp 97.8 F (36.6 C) (Oral)  Wt 132 lb (59.875 kg)  SpO2 99%  Review of Systems She also has intermittent headache.      Objective:   Physical Exam VITAL SIGNS:  See vs page GENERAL: no distress NECK: There is no palpable thyroid enlargement.  No thyroid nodule is palpable.  No palpable lymphadenopathy at the anterior neck.    Lab Results  Component Value Date   TSH 0.28* 09/23/2012   T4TOTAL 11.7 05/19/2012      Assessment & Plan:  Post-i-131 hypothyroidism, slightly overreplaced Sxs, not thyroid-related

## 2012-09-23 NOTE — Patient Instructions (Addendum)
blood tests are being requested for you today.  You will be contacted with results.   Please return in 4 months.   most of the time, a "lumpy thyroid" will eventually become overactive again.  this is usually a slow process, happening over the span of many years.

## 2012-09-24 LAB — TSH: TSH: 0.28 u[IU]/mL — ABNORMAL LOW (ref 0.35–5.50)

## 2012-09-25 MED ORDER — LEVOTHYROXINE SODIUM 75 MCG PO TABS
75.0000 ug | ORAL_TABLET | Freq: Every day | ORAL | Status: DC
Start: 2012-09-25 — End: 2013-06-05

## 2012-09-26 ENCOUNTER — Telehealth: Payer: Self-pay

## 2012-09-26 ENCOUNTER — Encounter: Payer: Self-pay | Admitting: Obstetrics and Gynecology

## 2012-09-26 ENCOUNTER — Ambulatory Visit: Payer: 59 | Admitting: Obstetrics and Gynecology

## 2012-09-26 VITALS — BP 96/62 | Ht 60.0 in | Wt 134.0 lb

## 2012-09-26 DIAGNOSIS — Z9889 Other specified postprocedural states: Secondary | ICD-10-CM | POA: Insufficient documentation

## 2012-09-26 DIAGNOSIS — Z9851 Tubal ligation status: Secondary | ICD-10-CM

## 2012-09-26 DIAGNOSIS — Z124 Encounter for screening for malignant neoplasm of cervix: Secondary | ICD-10-CM

## 2012-09-26 DIAGNOSIS — D219 Benign neoplasm of connective and other soft tissue, unspecified: Secondary | ICD-10-CM

## 2012-09-26 DIAGNOSIS — Z01419 Encounter for gynecological examination (general) (routine) without abnormal findings: Secondary | ICD-10-CM

## 2012-09-26 NOTE — Telephone Encounter (Signed)
Referral faxed to Dr.Balan's Office per SR Pt needs second opinion for unbalanced  hypothyroidism

## 2012-09-26 NOTE — Progress Notes (Signed)
The patient reports:has no complaints  Contraception:Ablation  Last mammogram: 04/30/2012  07/30/2012 Normal Last pap: 09/26/2011 Normal  GC/Chlamydia cultures offered: declined HIV/RPR/HbsAg offered:  declined HSV 1 and 2 glycoprotein offered: declined  Menstrual cycle regular and monthly: No Pt states has had cycles off and on Pt had 2 cycles in August that is because of thyroid issues  Menstrual flow normal: It has been heavier than usual   Struggling with Hypothyroidism with 2 recent changes in medication. Desires to see another endocrinologist for a 2nd opinion  Urinary symptoms: urinary incontinence Normal bowel movements: Yes Reports abuse at home: No:   Subjective:    Chelsey Hunt is a 47 y.o. female, G3P3, who presents for an annual exam.     History   Social History  . Marital Status: Married    Spouse Name: N/A    Number of Children: N/A  . Years of Education: N/A   Social History Main Topics  . Smoking status: Never Smoker   . Smokeless tobacco: Never Used  . Alcohol Use: No  . Drug Use: No  . Sexually Active: Yes    Birth Control/ Protection: Surgical     Comment: BTL   Other Topics Concern  . None   Social History Narrative   Regular exercise-yes    Menstrual cycle:   LMP: No LMP recorded. Patient has had an ablation.           Cycle: see HPI  The following portions of the patient's history were reviewed and updated as appropriate: allergies, current medications, past family history, past medical history, past social history, past surgical history and problem list.  Review of Systems Pertinent items are noted in HPI. Breast:Negative for breast lump,nipple discharge or nipple retraction Gastrointestinal: Negative for abdominal pain, change in bowel habits or rectal bleeding Urinary:negative   Objective:    There were no vitals taken for this visit.    Weight:  Wt Readings from Last 1 Encounters:  09/23/12 132 lb (59.875 kg)   BMI: There is no height or weight on file to calculate BMI.  General Appearance: Alert, appropriate appearance for age. No acute distress HEENT: Grossly normal Neck / Thyroid: Supple, no masses, nodes or enlargement Lungs: clear to auscultation bilaterally Back: No CVA tenderness Breast Exam: No masses or nodes.No dimpling, nipple retraction or discharge. Cardiovascular: Regular rate and rhythm. S1, S2, no murmur Gastrointestinal: Soft, non-tender, no masses or organomegaly Pelvic Exam: Vulva and vagina appear normal. Bimanual exam reveals normal uterus and adnexa. Rectovaginal: normal rectal, no masses Lymphatic Exam: Non-palpable nodes in neck, clavicular, axillary, or inguinal regions Skin: no rash or abnormalities Neurologic: Normal gait and speech, no tremor  Psychiatric: Alert and oriented, appropriate affect.    Assessment:    Normal gyn exam    Plan:    mammogram pap smear return annually or prn STD screening: declined Contraception:bilateral tubal ligation  Will refer to Dr Talmage Nap for 2nd opinion   Silverio Lay MD

## 2012-09-29 ENCOUNTER — Telehealth: Payer: Self-pay

## 2012-09-29 NOTE — Telephone Encounter (Signed)
Message copied by Darien Ramus on Mon Sep 29, 2012  7:59 AM ------      Message from: Tim Lair      Created: Fri Sep 26, 2012  3:01 PM      Regarding: RE: referral       DONE.            Dr.Balan's office is closed today, but I did fill out the Referral Info on Dr.Balan's website and faxed pt records w/ ins card and demo sheet. I put in the comment section on their site to fax/call with a confirmation to our office, and  I can call Monday for you to confirm Referral was received.            [facility] CMA        ----- Message -----         From: Esmeralda Arthur, MD         Sent: 09/26/2012   1:03 PM           To: [facility] Triage      Subject: referral                                                 Please refer to dr Talmage Nap for 2nd opinion on unbalanced hypothyroidism

## 2012-09-29 NOTE — Telephone Encounter (Signed)
Made Herbert Seta D aware this morning  I sent referral on Friday to Dr.Balan I am on the floor w/ EP  I gave note from SR to Eugene D  to f/u to see that referral was received today.    [facility] CMA

## 2012-09-30 LAB — PAP IG W/ RFLX HPV ASCU

## 2012-10-21 ENCOUNTER — Encounter: Payer: Self-pay | Admitting: Internal Medicine

## 2012-10-21 ENCOUNTER — Ambulatory Visit (INDEPENDENT_AMBULATORY_CARE_PROVIDER_SITE_OTHER): Payer: 59 | Admitting: Internal Medicine

## 2012-10-21 VITALS — BP 102/70 | HR 104 | Temp 97.9°F | Ht 60.0 in | Wt 131.0 lb

## 2012-10-21 DIAGNOSIS — J069 Acute upper respiratory infection, unspecified: Secondary | ICD-10-CM

## 2012-10-21 MED ORDER — AZITHROMYCIN 250 MG PO TABS: ORAL_TABLET | ORAL | Status: DC

## 2012-10-21 NOTE — Progress Notes (Signed)
HPI  Pt presents to the clinic today with c/o cold symptoms x 1 weeks. The worst part is the sore throat and dry cough. She does not produce any sputum. She has tried Robitussin, and zyrtec and nothing seems to help. She does have a history of allergies but no asthma. She does have sick contacts.  Review of Systems      Past Medical History  Diagnosis Date  . THYROID NODULE, RIGHT 04/21/2009  . 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 ANEMIA, IRON DEFICIENCY 12/23/2008  . Chronic tension type headache 01/13/2009  . ACID REFLUX DISEASE 01/03/2008  . Irritable bowel syndrome 12/23/2008  . HEART MURMUR, SYSTOLIC 07/07/2010  . Ovarian cyst   . Dysplasia of cervix, low grade (CIN 1)   . Fibroid   . Anemia   . Ovarian cyst   . GERD (gastroesophageal reflux disease)     Family History  Problem Relation Age of Onset  . Goiter Mother   . Heart disease Mother   . Thyroid disease Mother   . Graves' disease Sister   . Asthma Sister   . Goiter Other     Siblings 2  . Hypertension Paternal Grandfather   . Cancer Father     History   Social History  . Marital Status: Married    Spouse Name: N/A    Number of Children: N/A  . Years of Education: N/A   Occupational History  . Not on file.   Social History Main Topics  . Smoking status: Never Smoker   . Smokeless tobacco: Never Used  . Alcohol Use: No  . Drug Use: No  . Sexually Active: Yes    Birth Control/ Protection: Surgical     Comment: BTL   Other Topics Concern  . Not on file   Social History Narrative   Regular exercise-yes    Allergies  Allergen Reactions  . Nabumetone     REACTION: GI upset  . Propoxyphene-Acetaminophen   . Topiramate     REACTION: rash     Constitutional: Positive headache, fatigue. Denies fever or abrupt weight changes.  HEENT:  Positive sore throat. Denies eye redness, eye pain, pressure behind the eyes, facial pain, nasal congestion, ear pain, ringing in the ears, wax buildup, runny  nose or bloody nose. Respiratory: Positive cough. Denies difficulty breathing or shortness of breath.  Cardiovascular: Denies chest pain, chest tightness, palpitations or swelling in the hands or feet.   No other specific complaints in a complete review of systems (except as listed in HPI above).  Objective:   BP 102/70  Pulse 104  Temp(Src) 97.9 F (36.6 C) (Oral)  Ht 5' (1.524 m)  Wt 131 lb (59.421 kg)  BMI 25.58 kg/m2  SpO2 95%  LMP 09/05/2012 Wt Readings from Last 3 Encounters:  10/21/12 131 lb (59.421 kg)  09/26/12 134 lb (60.782 kg)  09/23/12 132 lb (59.875 kg)     General: Appears her stated age, well developed, well nourished in NAD. HEENT: Head: normal shape and size; Eyes: sclera white, no icterus, conjunctiva pink, PERRLA and EOMs intact; Ears: Tm's gray and intact, normal light reflex; Nose: mucosa pink and moist, septum midline; Throat/Mouth: + PND. Teeth present, mucosa erythematous and moist, no exudate noted, no lesions or ulcerations noted.  Neck: Mild cervical lymphadenopathy. Neck supple, trachea midline. No massses, lumps or thyromegaly present.  Cardiovascular: Normal rate and rhythm. S1,S2 noted.  No murmur, rubs or gallops noted. No JVD or BLE edema. No carotid bruits noted.  Pulmonary/Chest: Normal effort and positive vesicular breath sounds. No respiratory distress. No wheezes, rales or ronchi noted.      Assessment & Plan:   Upper Respiratory Infection, new onset with additional workup required:  Get some rest and drink plenty of water Do salt water gargles for the sore throat eRx for Azithromax x 5 days Pt declines cough syrup at this time  RTC as needed or if symptoms persist.

## 2012-10-21 NOTE — Patient Instructions (Signed)

## 2012-10-23 ENCOUNTER — Ambulatory Visit: Payer: 59 | Admitting: Internal Medicine

## 2012-10-29 ENCOUNTER — Ambulatory Visit: Payer: 59 | Admitting: Endocrinology

## 2012-10-29 DIAGNOSIS — Z0289 Encounter for other administrative examinations: Secondary | ICD-10-CM

## 2012-10-30 ENCOUNTER — Ambulatory Visit: Payer: 59 | Admitting: Internal Medicine

## 2012-11-06 ENCOUNTER — Ambulatory Visit: Payer: 59 | Admitting: Internal Medicine

## 2012-11-06 DIAGNOSIS — Z0289 Encounter for other administrative examinations: Secondary | ICD-10-CM

## 2013-01-21 ENCOUNTER — Ambulatory Visit: Payer: 59 | Admitting: Endocrinology

## 2013-01-21 DIAGNOSIS — Z0289 Encounter for other administrative examinations: Secondary | ICD-10-CM

## 2013-04-17 ENCOUNTER — Other Ambulatory Visit: Payer: Self-pay

## 2013-04-17 DIAGNOSIS — Z1231 Encounter for screening mammogram for malignant neoplasm of breast: Secondary | ICD-10-CM

## 2013-04-21 ENCOUNTER — Inpatient Hospital Stay (HOSPITAL_COMMUNITY)
Admission: AD | Admit: 2013-04-21 | Discharge: 2013-04-22 | Disposition: A | Discharge status: Home / Self Care | Payer: 59 | Source: Ambulatory Visit | Attending: Obstetrics and Gynecology | Admitting: Obstetrics and Gynecology

## 2013-04-21 ENCOUNTER — Encounter (HOSPITAL_COMMUNITY): Payer: Self-pay | Admitting: *Deleted

## 2013-04-21 DIAGNOSIS — D259 Leiomyoma of uterus, unspecified: Secondary | ICD-10-CM | POA: Insufficient documentation

## 2013-04-21 DIAGNOSIS — E039 Hypothyroidism, unspecified: Secondary | ICD-10-CM | POA: Insufficient documentation

## 2013-04-21 DIAGNOSIS — N938 Other specified abnormal uterine and vaginal bleeding: Secondary | ICD-10-CM | POA: Insufficient documentation

## 2013-04-21 DIAGNOSIS — N949 Unspecified condition associated with female genital organs and menstrual cycle: Secondary | ICD-10-CM | POA: Insufficient documentation

## 2013-04-21 LAB — WET PREP, GENITAL: Clue Cells Wet Prep HPF POC: NONE SEEN

## 2013-04-21 MED ORDER — IBUPROFEN 800 MG PO TABS
800.0000 mg | ORAL_TABLET | Freq: Once | ORAL | Status: DC
  Filled 2013-04-21: qty 1

## 2013-04-21 NOTE — MAU Note (Addendum)
PT SAYS  SHE HAD 3 CYCLES IN June,  THEN  July  24TH- CYCLE,  THEN  NOW - 8-11.    3 YEARS AGO SHE HAD AN ABLATION-   BLEEDING BECAME  WORSE WITH July CYCLE.      THIS CYCLE-  SAYS BLEEDING IS HEAVIER IN TOILET- THAN ON PAD.     WENT TO DR RIVARD  ON 7-28- DID AN U/S-  SAW    FIBROID- LEMON SIZE.   YESTERDAY CALLED HER IN [place].-  TOOK 1 TAB-   AT 4PM.      SAYS SHE HAS SLEPT ALL DAY.    SHE IS RELPAX- FOR H/A.   ON PAD IN TRIAGE   SMALL AMT LIGHT RED-- SAYS USUALLY BLOOD IS VERY DARK.   NO BIRTH CONTROL-- LAST  SEX- 8-3

## 2013-04-21 NOTE — MAU Note (Signed)
Pt states she is having pain that she is rating 10 sometimes. Now pt is rating the pain 04/04/2013 pt states started bleeding again today. Pt states she has had 6-7 bowel movements.

## 2013-04-22 LAB — COMPREHENSIVE METABOLIC PANEL
AST: 15 U/L (ref 0–37)
Albumin: 3.4 g/dL — ABNORMAL LOW (ref 3.5–5.2)
Alkaline Phosphatase: 71 U/L (ref 39–117)
BUN: 8 mg/dL (ref 6–23)
CO2: 20 mEq/L (ref 19–32)
Chloride: 104 mEq/L (ref 96–112)
Creatinine, Ser: 0.78 mg/dL (ref 0.50–1.10)
GFR calc non Af Amer: >90 mL/min (ref >90)
Potassium: 3.8 mEq/L (ref 3.5–5.1)
Total Bilirubin: 0.2 mg/dL — ABNORMAL LOW (ref 0.3–1.2)

## 2013-04-22 LAB — CBC
HCT: 39.7 % (ref 36.0–46.0)
MCH: 31 pg (ref 26.0–34.0)
MCHC: 34.5 g/dL (ref 30.0–36.0)
MCV: 89.8 fL (ref 78.0–100.0)
RDW: 12.9 % (ref 11.5–15.5)

## 2013-04-22 NOTE — MAU Provider Note (Signed)
History   Chelsey Hunt is a 46y.o. BF who presents unannounced w/ CC of DUB since June, w/ heavier VB in July and most recently w/ this episode of bleeding, and severe pelvic abdominal pain.  VB started 04/20/13; usually only needs liner, but since June has had to use regular peripads and changes several times per day.  Even reports VB drips out into toilet when uses BR.   Pt had an in-office ablation in 2012.  She reports she has continued to have light cycles since procedure, usually monthly, but has rarely skipped 1-2 months.  VB is usually for one week during the month, but bled 3 different weeks in June.  Saw P.A., Henreitta Leber at [facility] 03/16/13 for DUB, and TSH normal.  Had u/s f/u on 04/06/13 and showed posterior intramural fibroid, and questionable endometrial polyp/hyperechoic mass =1.3 cm.  Pt reports several family members have had to have hysterectomies b/c of similar symptoms in their lives.  She called office yesterday to report pain worse and bleeding again, and office called in Vicodin; last took one pill this afternoon w/ little relief.  Last ate around lunch time.  Doesn't take ibuprofen b/c was told in past not to take w/ her Relpax; migraines vary in incident, but usually w/ cycles and she last took Relpax this past Sat.  Pt reports typically has more normal BM's w/ cycles, but in last 24 hrs has had 6-7 BMs and somewhat loose.  No dysuria.  She is not using contraception, but reports BTL in past as well.  Last IC 04/12/13.  Pap negative 09/23/12; at that time, TSH was low=0.28.  Pt reports abdominal pain has been woken her frequently in her sleep at night.  Her s.o. Accompanied her to MAU and he was concerned with symptoms and pain.  Pt denies fever or chills.  No UTI s/s.  No resp c/o's.  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   .. Patient Active Problem List   Diagnosis Date Noted  . S/P endometrial ablation 09/26/2012  . Fibroid 09/26/2012  . S/P tubal  ligation 09/26/2012  . Routine general medical examination at a health care facility 07/30/2012  . Dysplasia of cervix, low grade (CIN 1)   . Anemia   . GERD (gastroesophageal reflux disease)   . Migraine 07/29/2011  . 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 Chronic tension type headache 01/13/2009  . ANEMIA, IRON DEFICIENCY 12/23/2008  . Irritable bowel syndrome 12/23/2008    CSN: [id number]  Arrival date and time: 04/21/13 1825   None     Chief Complaint  Patient presents with  . Pelvic Pain   HPI  OB History   Grav Para Term Preterm Abortions TAB SAB Ect Mult Living   3 3        3       Past Medical History  Diagnosis Date  . THYROID NODULE, RIGHT 04/21/2009  . 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 ANEMIA, IRON DEFICIENCY 12/23/2008  . Chronic tension type headache 01/13/2009  . ACID REFLUX DISEASE 01/03/2008  . Irritable bowel syndrome 12/23/2008  . HEART MURMUR, SYSTOLIC 07/07/2010  . Ovarian cyst   . Dysplasia of cervix, low grade (CIN 1)   . Fibroid   . Anemia   . Ovarian cyst   . GERD (gastroesophageal reflux disease)     Past Surgical History  Procedure Laterality Date  . Tubal ligation    . Novasure ablation    . Hysteroscopy    .  Polypectomy    . Laparoscopy      Family History  Problem Relation Age of Onset  . Goiter Mother   . Heart disease Mother   . Thyroid disease Mother   . Graves' disease Sister   . Asthma Sister   . Goiter Other     Siblings 2  . Hypertension Paternal Grandfather   . Cancer Father     History  Substance Use Topics  . Smoking status: Never Smoker   . Smokeless tobacco: Never Used  . Alcohol Use: No    Allergies:  Allergies  Allergen Reactions  . Nabumetone     REACTION: GI upset  . Propoxyphene-Acetaminophen   . Topiramate     REACTION: rash    Prescriptions prior to admission  Medication Sig Dispense Refill  . azithromycin (ZITHROMAX) 250 MG tablet Take 2 tablets today then 1 tablet daily  for 4 days  6 tablet  0  . cetirizine (ZYRTEC) 10 MG tablet 1 tablet as needed for allergies       . Cyanocobalamin (B-12) 500 MCG TABS Take 1 tablet by mouth daily.        Marland Kitchen eletriptan (RELPAX) 40 MG tablet One tablet by mouth as needed for migraine headache.  If the headache improves and then returns, dose may be repeated after 2 hours have elapsed since first dose (do not exceed 80 mg per day). may repeat in 2 hours if necessary       . folic acid (FOLVITE) 1 MG tablet Take 1 mg by mouth daily.        Marland Kitchen levothyroxine (SYNTHROID, LEVOTHROID) 75 MCG tablet Take 1 tablet (75 mcg total) by mouth daily.  90 tablet  3  . Magnesium 100 MG TABS Take 1 tablet by mouth 2 (two) times daily.        . pantoprazole (PROTONIX) 40 MG tablet Take 40 mg by mouth 2 (two) times daily.      . Pediatric Multiple Vit-C-FA (FLINSTONES GUMMIES OMEGA-3 DHA) CHEW Chew by mouth daily.      . promethazine (PHENERGAN) 25 MG tablet Take 25 mg by mouth every 4 (four) hours as needed. For nausea       . ranitidine (ZANTAC) 150 MG tablet Take 150 mg by mouth.        . Riboflavin (B-2) 100 MG TABS Take 1 tablet by mouth daily.        . Simethicone ([employer] GAS RELIEF ULTRA STRENGTH) 180 MG CAPS Take by mouth as needed.          ROS--see HPI Physical Exam   Blood pressure 125/71, pulse 81, temperature 97.9 F (36.6 C), temperature source Oral, resp. rate 20, height 5' (1.524 m), weight 138 lb 2 oz (62.653 kg), last menstrual period 04/20/2013. .. Results for orders placed during the hospital encounter of 04/21/13 (from the past 72 hour(s))  CBC     Status: None   Collection Time    04/21/13 10:50 PM      Result Value Range   WBC 5.6  4.0 - 10.5 K/uL   RBC 4.42  3.87 - 5.11 MIL/uL   Hemoglobin 13.7  12.0 - 15.0 g/dL   HCT 45.4  09.8 - 11.9 %   MCV 89.8  78.0 - 100.0 fL   MCH 31.0  26.0 - 34.0 pg   MCHC 34.5  30.0 - 36.0 g/dL   RDW 14.7  82.9 - 56.2 %   Platelets 182  150 -  400 K/uL  WET PREP, GENITAL     Status:  Abnormal   Collection Time    04/21/13 10:50 PM      Result Value Range   Yeast Wet Prep HPF POC NONE SEEN  NONE SEEN   Trich, Wet Prep NONE SEEN  NONE SEEN   Clue Cells Wet Prep HPF POC NONE SEEN  NONE SEEN   WBC, Wet Prep HPF POC FEW (*) NONE SEEN   Comment: FEW BACTERIA SEEN  COMPREHENSIVE METABOLIC PANEL     Status: Abnormal   Collection Time    04/21/13 10:50 PM      Result Value Range   Sodium 137  135 - 145 mEq/L   Potassium 3.8  3.5 - 5.1 mEq/L   Chloride 104  96 - 112 mEq/L   CO2 20  19 - 32 mEq/L   Glucose, Bld 120 (*) 70 - 99 mg/dL   BUN 8  6 - 23 mg/dL   Creatinine, Ser 1.61  0.50 - 1.10 mg/dL   Calcium 9.4  8.4 - 09.6 mg/dL   Total Protein 7.4  6.0 - 8.3 g/dL   Albumin 3.4 (*) 3.5 - 5.2 g/dL   AST 15  0 - 37 U/L   ALT 11  0 - 35 U/L   Alkaline Phosphatase 71  39 - 117 U/L   Total Bilirubin 0.2 (*) 0.3 - 1.2 mg/dL   GFR calc non Af Amer >90  >90 mL/min   GFR calc Af Amer >90  >90 mL/min   Comment:            The eGFR has been calculated     using the CKD EPI equation.     This calculation has not been     validated in all clinical     situations.     eGFR's persistently     <90 mL/min signify     possible Chronic Kidney Disease.  POCT PREGNANCY, URINE     Status: None   Collection Time    04/21/13 11:12 PM      Result Value Range   Preg Test, Ur NEGATIVE  NEGATIVE   Comment:            THE SENSITIVITY OF THIS     METHODOLOGY IS >24 mIU/mL   Physical Exam  Constitutional: She is oriented to person, place, and time. She appears well-developed and well-nourished. She appears distressed.  Grimace and tenses at times, but pleasant and cooperative.    HENT:  Head: Normocephalic and atraumatic.  Eyes: Pupils are equal, round, and reactive to light.  Cardiovascular: Normal rate.   Respiratory: Effort normal.  GI: Soft. She exhibits no distension and no mass. There is tenderness. There is no rebound and no guarding.  Genitourinary:  SSE: mod amt of BRB in  vault, cleared w/ 3 fox swabs.  Cx pink and smooth, closed Uterus enlarged on bimanual exam  Musculoskeletal: She exhibits no edema.  Neurological: She is alert and oriented to person, place, and time.  Skin: Skin is warm and dry.  Psychiatric: She has a normal mood and affect. Her behavior is normal. Judgment and thought content normal.    MAU Course  Procedures 1. Motrin 800mg  po x1 (declined Toradol IM) with moderate relief of pain 2  UPT 3. Wet prep 4. Cbc, cmet   Assessment and Plan  1. Pelvic pain w/ h/o fibroid on u/s in July 2. DUB s/p ablation 61yrs ago 3. Hypothyroidism 4.  Cbc stable 5. Nonpregnant and s/p BTL in past 6. Increased BM's may be related to uterine/intestinal cramping since onset of bleeding; does have h/o IBS--CMET stable  1. Per c/w dr. Su Hilt, she did not feel repeating u/s was necessary tonight 2. Following motrin and reviewing lab results, pt d/c'd home w/ bleeding precautions and instructed to call [facility] asap to schedule sonohystogram 3. C/w pharmacist her at [facility] and stated rev'd 2 sources and no contraindication to taking Motrin and Relpax--instructed pt to take 800mg  po q8hr w/ food x24 hrs and Vicodin prn further pain relief 4. Returns to work this Friday night 5. F/u prn worsening s/s or concerns    Keyshaun Exley H 04/22/2013, 12:24 AM

## 2013-05-01 ENCOUNTER — Ambulatory Visit
Admission: RE | Admit: 2013-05-01 | Discharge: 2013-05-01 | Disposition: A | Discharge status: Home / Self Care | Payer: 59 | Source: Ambulatory Visit

## 2013-05-01 DIAGNOSIS — Z1231 Encounter for screening mammogram for malignant neoplasm of breast: Secondary | ICD-10-CM

## 2013-05-04 ENCOUNTER — Other Ambulatory Visit: Payer: Self-pay | Admitting: Obstetrics and Gynecology

## 2013-06-05 ENCOUNTER — Encounter (HOSPITAL_COMMUNITY): Payer: Self-pay | Admitting: Pharmacist

## 2013-06-15 ENCOUNTER — Encounter (HOSPITAL_COMMUNITY)
Admission: RE | Admit: 2013-06-15 | Discharge: 2013-06-15 | Disposition: A | Discharge status: Home / Self Care | Payer: 59 | Source: Ambulatory Visit | Attending: Obstetrics and Gynecology | Admitting: Obstetrics and Gynecology

## 2013-06-15 ENCOUNTER — Encounter (HOSPITAL_COMMUNITY): Payer: Self-pay

## 2013-06-15 ENCOUNTER — Other Ambulatory Visit (HOSPITAL_COMMUNITY): Payer: 59

## 2013-06-15 DIAGNOSIS — Z01812 Encounter for preprocedural laboratory examination: Secondary | ICD-10-CM | POA: Insufficient documentation

## 2013-06-15 HISTORY — DX: Adverse effect of unspecified anesthetic, initial encounter: T41.45XA

## 2013-06-15 HISTORY — DX: Other complications of anesthesia, initial encounter: T88.59XA

## 2013-06-15 LAB — CBC
HCT: 38.8 % (ref 36.0–46.0)
MCHC: 33 g/dL (ref 30.0–36.0)
MCV: 90.4 fL (ref 78.0–100.0)
Platelets: 369 10*3/uL (ref 150–400)
RDW: 12.5 % (ref 11.5–15.5)
WBC: 5.9 10*3/uL (ref 4.0–10.5)

## 2013-06-15 LAB — BASIC METABOLIC PANEL
BUN: 14 mg/dL (ref 6–23)
Calcium: 9.9 mg/dL (ref 8.4–10.5)
Chloride: 101 mEq/L (ref 96–112)
Creatinine, Ser: 1.05 mg/dL (ref 0.50–1.10)
GFR calc Af Amer: 72 mL/min — ABNORMAL LOW (ref >90)
GFR calc non Af Amer: 62 mL/min — ABNORMAL LOW (ref >90)

## 2013-06-15 NOTE — Patient Instructions (Addendum)
Your procedure is scheduled on: 06/24/2013  Enter through the Main Entrance of [facility] at: 0700AM  Pick up the phone at the desk and dial [phone].  Call this number if you have problems the morning of surgery: [phone].  Remember: Do NOT eat food: After midnight 06/23/2013 Do NOT drink clear liquids after:  After midnight 06/23/2013 Take these medicines the morning of surgery with a SIP OF WATER: Protonix,Synthroid  Do NOT wear jewelry, make-up, or nail polish. Do NOT wear lotions, powders, or perfumes.  You may wear deordrant. Do NOT shave for 48 hours prior to surgery. Do NOT bring valuables to the hospital. Contacts, dentures, or bridgework may not be worn into surgery. Leave suitcase in car.  After surgery it may be brought to your room.  For patients admitted to the hospital, checkout time is 11:00 AM the day of discharge.

## 2013-06-16 ENCOUNTER — Other Ambulatory Visit: Payer: Self-pay | Admitting: Obstetrics and Gynecology

## 2013-06-16 NOTE — H&P (Signed)
Chelsey Hunt is a 47 y.o. female P 3-0-0-3 presents for hysterectomy because of pelvic pain, irregular bleeding and fibroid.  The patient underwent a Novasure endometrial ablation in 2012 and was amenorrheic and without pelvic pain until July 2014 when she developed excruciating  pelvic pain that caused her to go to the Maternity Admissions Unit at [facility].  Patient states that the pain was so severe that she almost passed out.  Per patient, she was told after an ultrasound that she had "scar tissue" within her uterus. The following day she began to have menstrual-like bleeding and her pain subsided. Subsequent follow up with a [facility] showed a uterus: 9.16 x 6.10 x 5.21 cm, endometrium: 12.90 cm with a hyper-echoic mass measuring 1.3 x 0.87 x 1.3 cm, posterior fibroid: 3.57 x 3.09 x 3.14 cm and right ovary: 3.96 x 3.40 x 1.67 cm/left ovary: 3.64 x 2.33 x 1.72 cm.  Since her initial episode, the patient has not had any further pelvic pain but states that she never wants to experience that again.  She has had, however, monthly vaginal bleeding for 5 days causing her to change her pad every 3 hours.  There are times she may have to change more frequently because she will have gushes of blood occur.  She denies any cramping with these episodes.  An endometrial biopsy in August 2014 did not show any atypia, hyperplasia or malignancy.  She denies inter-menstrual bleeding, bowel, vaginitis or bladder symptoms.  After reviewed both medical and surgical management options with the patient, she remained certain that she wanted to proceed with hysterectomy.    Past Medical History  OB History: G 3; P 3-0-0-3  SVB 1984, 1987 and 1990  GYN History: menarche: 47 YO   LMP: 8/ 06/2013;   Contracepton bilateral tubal ligation  The patient reports a past history of: trichomonas.  Has a remote  history of abnormal PAP smear that was treated with cryotherapy; Last PAP smear 03/2013-normal  Medical  History: Anemia, thyroid disease (had thyroid nodule irradiated), anemia, GERD,  IBS and heart murmur  Surgical History:  1990 Tubal Sterilization;   2011 Hysteroscopic Polypectomy;  2012 Endometrial Ablation (Novasure) Denies problems with anesthesia except that she is slow to awaken  No  history of blood transfusions  Family History: Thyroid disease, cardiovascular disease, asthma, hypertension, throat and lung cancers  Social History:   Married and employed at [employer] [place];  Denies tobacco use, occasional alcohol use and denies illicit drug use   Medications:  Cyclobenzaprine 10 mg   tid prn Vicodin 5/325  every 4 hours prn Levothyroxine 88 mcg daily Pantoprazole 40 mg daily prn Promethazine 25 mg  prn Relpax 40 mg as directed  prn Sulindac 200 mg as directed prn  Allergies  Allergen Reactions  . Propoxyphene-Acetaminophen   . Nabumetone Rash    REACTION: GI upset  . Topiramate Rash    REACTION: rash    Denies sensitivity to peanuts, shellfish, soy, latex or adhesives.   ROS: Admits to glasses, tinnitus, constipation, migraine headaches and random leaking of urine with sneezing;   Denies  vision changes, nasal congestion, dysphagia, dizziness, hoarseness, cough,  chest pain, shortness of breath, nausea, vomiting, diarrhea, urinary frequency, urgency  dysuria, hematuria, vaginitis symptoms, pelvic pain, swelling of joints,easy bruising,  myalgias, arthralgias, skin rashes, unexplained weight loss and except as is mentioned in the history of present illness, patient's review of systems is otherwise negative.  Physical Exam  Bp:  112/88    P: 64    R: 12   Temperature: 98.9 degrees F orally   Weight: 139 lbs.  Height: 5'    BMI: 27.1  Neck: supple without masses or thyromegaly Lungs: clear to auscultation Heart: regular rate and rhythm Abdomen: soft, non-tender and no organomegaly Pelvic:EGBUS- wnl; vagina-normal rugae; uterus-normal size, cervix without  lesions or motion tenderness; adnexae-no tenderness or masses Extremities:  no clubbing, cyanosis or edema  Assesment:  Irregular Bleeding                       Pelvic Pain                       Uterine Fibroid   Disposition:  A discussion was held with patient regarding the indication for her procedure(s) along with the risks, which include but are not limited to: reaction to anesthesia, damage to adjacent organs, infection, pelvic prolapse and excessive bleeding. The patient verbalized understanding of these risks and has consented to proceed with Total Vaginal Hysterectomy with Possible Salpingectomy at [facility] 06/24/13 @8 :30 a.m.    CSN# [id number]   Avneet Ashmore J. Lowell Guitar, PA-C  for Dr. Crist Fat. Rivard

## 2013-06-24 ENCOUNTER — Encounter (HOSPITAL_COMMUNITY)
Admission: RE | Disposition: A | Discharge status: Home / Self Care | Payer: Self-pay | Source: Ambulatory Visit | Attending: Obstetrics and Gynecology

## 2013-06-24 ENCOUNTER — Encounter (HOSPITAL_COMMUNITY): Payer: Self-pay | Admitting: Certified Registered"

## 2013-06-24 ENCOUNTER — Ambulatory Visit (HOSPITAL_COMMUNITY): Payer: 59 | Admitting: Certified Registered"

## 2013-06-24 ENCOUNTER — Ambulatory Visit (HOSPITAL_COMMUNITY)
Admission: RE | Admit: 2013-06-24 | Discharge: 2013-06-24 | Disposition: A | Discharge status: Home / Self Care | Payer: 59 | Source: Ambulatory Visit | Attending: Obstetrics and Gynecology | Admitting: Obstetrics and Gynecology

## 2013-06-24 ENCOUNTER — Encounter (HOSPITAL_COMMUNITY): Payer: 59 | Admitting: Certified Registered"

## 2013-06-24 DIAGNOSIS — N72 Inflammatory disease of cervix uteri: Secondary | ICD-10-CM | POA: Insufficient documentation

## 2013-06-24 DIAGNOSIS — D251 Intramural leiomyoma of uterus: Secondary | ICD-10-CM | POA: Insufficient documentation

## 2013-06-24 DIAGNOSIS — N8000 Endometriosis of the uterus, unspecified: Secondary | ICD-10-CM | POA: Insufficient documentation

## 2013-06-24 DIAGNOSIS — N920 Excessive and frequent menstruation with regular cycle: Secondary | ICD-10-CM | POA: Insufficient documentation

## 2013-06-24 DIAGNOSIS — N949 Unspecified condition associated with female genital organs and menstrual cycle: Secondary | ICD-10-CM | POA: Insufficient documentation

## 2013-06-24 DIAGNOSIS — N9489 Other specified conditions associated with female genital organs and menstrual cycle: Secondary | ICD-10-CM | POA: Insufficient documentation

## 2013-06-24 DIAGNOSIS — E079 Disorder of thyroid, unspecified: Secondary | ICD-10-CM | POA: Insufficient documentation

## 2013-06-24 HISTORY — PX: VAGINAL HYSTERECTOMY: SHX2639

## 2013-06-24 LAB — PREGNANCY, URINE: Preg Test, Ur: NEGATIVE

## 2013-06-24 SURGERY — HYSTERECTOMY, VAGINAL
Anesthesia: General | Site: Vagina | Wound class: Clean Contaminated

## 2013-06-24 MED ORDER — MIDAZOLAM HCL 2 MG/2ML IJ SOLN
INTRAMUSCULAR | Status: AC
  Filled 2013-06-24: qty 2

## 2013-06-24 MED ORDER — NEOSTIGMINE METHYLSULFATE 1 MG/ML IJ SOLN
INTRAMUSCULAR | Status: AC
  Filled 2013-06-24: qty 1

## 2013-06-24 MED ORDER — DEXAMETHASONE SODIUM PHOSPHATE 10 MG/ML IJ SOLN
INTRAMUSCULAR | Status: DC | PRN
  Administered 2013-06-24: 10 mg via INTRAVENOUS

## 2013-06-24 MED ORDER — ONDANSETRON HCL 4 MG/2ML IJ SOLN
INTRAMUSCULAR | Status: AC
  Filled 2013-06-24: qty 2

## 2013-06-24 MED ORDER — ESTRADIOL 0.1 MG/GM VA CREA
TOPICAL_CREAM | VAGINAL | Status: AC
  Filled 2013-06-24: qty 42.5

## 2013-06-24 MED ORDER — LEVOTHYROXINE SODIUM 88 MCG PO TABS
88.0000 ug | ORAL_TABLET | Freq: Every day | ORAL | Status: DC
  Filled 2013-06-24: qty 1

## 2013-06-24 MED ORDER — HYDROMORPHONE HCL PF 1 MG/ML IJ SOLN
INTRAMUSCULAR | Status: AC
  Filled 2013-06-24: qty 1

## 2013-06-24 MED ORDER — KETOROLAC TROMETHAMINE 30 MG/ML IJ SOLN
INTRAMUSCULAR | Status: AC
  Filled 2013-06-24: qty 1

## 2013-06-24 MED ORDER — MIDAZOLAM HCL 2 MG/2ML IJ SOLN
INTRAMUSCULAR | Status: DC | PRN
  Administered 2013-06-24: 2 mg via INTRAVENOUS

## 2013-06-24 MED ORDER — PROPOFOL 10 MG/ML IV BOLUS
INTRAVENOUS | Status: DC | PRN
  Administered 2013-06-24: 175 mg via INTRAVENOUS

## 2013-06-24 MED ORDER — KETOROLAC TROMETHAMINE 30 MG/ML IJ SOLN
INTRAMUSCULAR | Status: DC | PRN
  Administered 2013-06-24: 30 mg via INTRAMUSCULAR
  Administered 2013-06-24: 30 mg via INTRAVENOUS

## 2013-06-24 MED ORDER — PANTOPRAZOLE SODIUM 40 MG PO TBEC
40.0000 mg | DELAYED_RELEASE_TABLET | Freq: Once | ORAL | Status: AC
  Administered 2013-06-24: 40 mg via ORAL

## 2013-06-24 MED ORDER — LACTATED RINGERS IV SOLN
INTRAVENOUS | Status: DC
  Administered 2013-06-24 (×3): via INTRAVENOUS

## 2013-06-24 MED ORDER — LIDOCAINE HCL (CARDIAC) 20 MG/ML IV SOLN
INTRAVENOUS | Status: AC
  Filled 2013-06-24: qty 5

## 2013-06-24 MED ORDER — FENTANYL CITRATE 0.05 MG/ML IJ SOLN
INTRAMUSCULAR | Status: AC
  Filled 2013-06-24: qty 5

## 2013-06-24 MED ORDER — MENTHOL 3 MG MT LOZG: 1.0000 | LOZENGE | OROMUCOSAL | Status: DC | PRN

## 2013-06-24 MED ORDER — ESTRADIOL 0.1 MG/GM VA CREA
TOPICAL_CREAM | VAGINAL | Status: DC | PRN
  Administered 2013-06-24: 1 via VAGINAL

## 2013-06-24 MED ORDER — OXYCODONE-ACETAMINOPHEN 5-325 MG PO TABS: 1.0000 | ORAL_TABLET | ORAL | Status: DC | PRN

## 2013-06-24 MED ORDER — OXYCODONE-ACETAMINOPHEN 5-325 MG PO TABS
1.0000 | ORAL_TABLET | ORAL | Status: DC | PRN
  Administered 2013-06-24 (×2): 1 via ORAL
  Filled 2013-06-24 (×2): qty 1

## 2013-06-24 MED ORDER — GLYCOPYRROLATE 0.2 MG/ML IJ SOLN
INTRAMUSCULAR | Status: DC | PRN
  Administered 2013-06-24: .8 mg via INTRAVENOUS

## 2013-06-24 MED ORDER — LIDOCAINE HCL (CARDIAC) 20 MG/ML IV SOLN
INTRAVENOUS | Status: DC | PRN
  Administered 2013-06-24: 100 mg via INTRAVENOUS

## 2013-06-24 MED ORDER — LACTATED RINGERS IV BOLUS (SEPSIS)
500.0000 mL | Freq: Once | INTRAVENOUS | Status: AC
  Administered 2013-06-24: 500 mL via INTRAVENOUS

## 2013-06-24 MED ORDER — HYDROMORPHONE HCL PF 1 MG/ML IJ SOLN
0.2500 mg | INTRAMUSCULAR | Status: DC | PRN
  Administered 2013-06-24 (×4): 0.5 mg via INTRAVENOUS

## 2013-06-24 MED ORDER — PANTOPRAZOLE SODIUM 40 MG PO TBEC
DELAYED_RELEASE_TABLET | ORAL | Status: AC
  Filled 2013-06-24: qty 1

## 2013-06-24 MED ORDER — LACTATED RINGERS IV SOLN
INTRAVENOUS | Status: DC
  Administered 2013-06-24: 20:00:00 via INTRAVENOUS
  Administered 2013-06-24: 125 mL/h via INTRAVENOUS

## 2013-06-24 MED ORDER — PROMETHAZINE HCL 25 MG/ML IJ SOLN: 6.2500 mg | INTRAMUSCULAR | Status: DC | PRN

## 2013-06-24 MED ORDER — ROCURONIUM BROMIDE 50 MG/5ML IV SOLN
INTRAVENOUS | Status: AC
  Filled 2013-06-24: qty 1

## 2013-06-24 MED ORDER — 0.9 % SODIUM CHLORIDE (POUR BTL) OPTIME
TOPICAL | Status: DC | PRN
  Administered 2013-06-24: 1000 mL

## 2013-06-24 MED ORDER — KETOROLAC TROMETHAMINE 30 MG/ML IJ SOLN
30.0000 mg | Freq: Four times a day (QID) | INTRAMUSCULAR | Status: AC
  Administered 2013-06-24: 30 mg via INTRAVENOUS
  Filled 2013-06-24: qty 1

## 2013-06-24 MED ORDER — ONDANSETRON HCL 4 MG/2ML IJ SOLN
INTRAMUSCULAR | Status: DC | PRN
  Administered 2013-06-24: 4 mg via INTRAMUSCULAR

## 2013-06-24 MED ORDER — LIDOCAINE-EPINEPHRINE (PF) 1 %-1:200000 IJ SOLN
INTRAMUSCULAR | Status: AC
  Filled 2013-06-24: qty 10

## 2013-06-24 MED ORDER — IBUPROFEN 600 MG PO TABS: ORAL_TABLET | ORAL | Status: DC

## 2013-06-24 MED ORDER — ONDANSETRON HCL 4 MG PO TABS
4.0000 mg | ORAL_TABLET | Freq: Three times a day (TID) | ORAL | Status: DC | PRN
  Administered 2013-06-24: 4 mg via ORAL
  Filled 2013-06-24: qty 1

## 2013-06-24 MED ORDER — DEXAMETHASONE SODIUM PHOSPHATE 10 MG/ML IJ SOLN
INTRAMUSCULAR | Status: AC
  Filled 2013-06-24: qty 1

## 2013-06-24 MED ORDER — MEPERIDINE HCL 25 MG/ML IJ SOLN: 6.2500 mg | INTRAMUSCULAR | Status: DC | PRN

## 2013-06-24 MED ORDER — LIDOCAINE-EPINEPHRINE (PF) 1 %-1:200000 IJ SOLN
INTRAMUSCULAR | Status: DC | PRN
  Administered 2013-06-24: 16 mL

## 2013-06-24 MED ORDER — NEOSTIGMINE METHYLSULFATE 1 MG/ML IJ SOLN
INTRAMUSCULAR | Status: DC | PRN
  Administered 2013-06-24: 4 mg via INTRAVENOUS

## 2013-06-24 MED ORDER — FENTANYL CITRATE 0.05 MG/ML IJ SOLN
INTRAMUSCULAR | Status: DC | PRN
  Administered 2013-06-24 (×2): 50 ug via INTRAVENOUS

## 2013-06-24 MED ORDER — PANTOPRAZOLE SODIUM 40 MG PO TBEC
40.0000 mg | DELAYED_RELEASE_TABLET | Freq: Two times a day (BID) | ORAL | Status: DC
  Administered 2013-06-24: 40 mg via ORAL
  Filled 2013-06-24 (×5): qty 1

## 2013-06-24 MED ORDER — GLYCOPYRROLATE 0.2 MG/ML IJ SOLN
INTRAMUSCULAR | Status: AC
  Filled 2013-06-24: qty 3

## 2013-06-24 MED ORDER — ROCURONIUM BROMIDE 100 MG/10ML IV SOLN
INTRAVENOUS | Status: DC | PRN
  Administered 2013-06-24: 40 mg via INTRAVENOUS

## 2013-06-24 MED ORDER — PROPOFOL 10 MG/ML IV EMUL
INTRAVENOUS | Status: AC
  Filled 2013-06-24: qty 20

## 2013-06-24 MED ORDER — LACTATED RINGERS IV BOLUS (SEPSIS): 500.0000 mL | Freq: Once | INTRAVENOUS | Status: DC

## 2013-06-24 MED ORDER — IBUPROFEN 600 MG PO TABS
600.0000 mg | ORAL_TABLET | Freq: Four times a day (QID) | ORAL | Status: DC | PRN
  Administered 2013-06-24: 600 mg via ORAL
  Filled 2013-06-24: qty 1

## 2013-06-24 MED ORDER — DEXTROSE 5 % IV SOLN
2.0000 g | Freq: Once | INTRAVENOUS | Status: AC
  Administered 2013-06-24: 2 g via INTRAVENOUS
  Filled 2013-06-24: qty 2

## 2013-06-24 SURGICAL SUPPLY — 28 items
CANISTER SUCT 3000ML (MISCELLANEOUS) ×2 IMPLANT
CLOTH BEACON ORANGE TIMEOUT ST (SAFETY) ×2 IMPLANT
CONT PATH 16OZ SNAP LID 3702 (MISCELLANEOUS) ×2 IMPLANT
DECANTER SPIKE VIAL GLASS SM (MISCELLANEOUS) ×2 IMPLANT
DRAPE PROXIMA HALF (DRAPES) ×4 IMPLANT
DRAPE STERI URO 9X17 APER PCH (DRAPES) ×2 IMPLANT
DRESSING TELFA 8X3 (GAUZE/BANDAGES/DRESSINGS) IMPLANT
GAUZE PACKING 1 X5 YD ST (GAUZE/BANDAGES/DRESSINGS) ×2 IMPLANT
GLOVE BIOGEL PI IND STRL 7.0 (GLOVE) ×1 IMPLANT
GLOVE BIOGEL PI INDICATOR 7.0 (GLOVE) ×1
GLOVE ECLIPSE 6.5 STRL STRAW (GLOVE) ×2 IMPLANT
GOWN STRL REIN XL XLG (GOWN DISPOSABLE) ×8 IMPLANT
NEEDLE HYPO 22GX1.5 SAFETY (NEEDLE) IMPLANT
NEEDLE SPNL 20GX3.5 QUINCKE YW (NEEDLE) ×2 IMPLANT
NS IRRIG 1000ML POUR BTL (IV SOLUTION) ×2 IMPLANT
PACK VAGINAL WOMENS (CUSTOM PROCEDURE TRAY) ×2 IMPLANT
PAD OB MATERNITY 4.3X12.25 (PERSONAL CARE ITEMS) ×2 IMPLANT
SUT VIC AB 0 CT1 18XCR BRD8 (SUTURE) ×2 IMPLANT
SUT VIC AB 0 CT1 27 (SUTURE) ×6
SUT VIC AB 0 CT1 27XBRD ANBCTR (SUTURE) ×3 IMPLANT
SUT VIC AB 0 CT1 8-18 (SUTURE) ×2
SUT VIC AB 2-0 CT1 27 (SUTURE)
SUT VIC AB 2-0 CT1 TAPERPNT 27 (SUTURE) IMPLANT
SUT VICRYL 0 TIES 12 18 (SUTURE) ×2 IMPLANT
SYR TB 1ML 25GX5/8 (SYRINGE) ×2 IMPLANT
TOWEL OR 17X24 6PK STRL BLUE (TOWEL DISPOSABLE) ×4 IMPLANT
TRAY FOLEY CATH 14FR (SET/KITS/TRAYS/PACK) ×2 IMPLANT
WATER STERILE IRR 1000ML POUR (IV SOLUTION) IMPLANT

## 2013-06-24 NOTE — Interval H&P Note (Signed)
History and Physical Interval Note:  06/24/2013 8:30 AM  Chelsey Hunt  has presented today for surgery, with the diagnosis of Menorrhagia  The various methods of treatment have been discussed with the patient and family. After consideration of risks, benefits and other options for treatment, the patient has consented to  Procedure(s): TOTAL HYSTERECTOMY VAGINAL (N/A) as a surgical intervention .  The patient's history has been reviewed, patient examined, no change in status, stable for surgery.  I have reviewed the patient's chart and labs.  Questions were answered to the patient's satisfaction.     Aron Needles A

## 2013-06-24 NOTE — Progress Notes (Addendum)
Discharged home with significant other.  Discharged instructions reviewed, patient verbalized understanding of all d/c instructions.  To car via wheelchair  Patient voiding, denies nausea, tolerated po pain med and solid foods.

## 2013-06-24 NOTE — Op Note (Signed)
Preoperative diagnosis: Uterine fibroids with dysfunctional uterine bleeding  Postoperative diagnosis: Same  Anesthesia: Gen.  Anesthesiologist: Dr. Cristela Blue  Procedure: Total vaginal hysterectomy  Surgeon: Dr. Dois Davenport Lorenzo Pereyra  Assistant: Henreitta Leber P.A.-C.  Estimated blood loss: 50 cc  Procedure:  After being informed of the planned procedure with possible complications including but not limited to bleeding, infection, injury to other organs, need for laparotomy, expected hospitals they in recovery, informed consent is obtained and patient is taken to or #4. She is given general anesthesia with endotracheal intubation without any complication. She is placed in the lithotomy position prepped and draped in a sterile fashion. A Foley catheter is inserted in her bladder. Pelvic exam reveals an anteverted uterus with slightly irregular contours. Prolapse is limited to a grade 1/4. Adnexa feel normal.  We insert a weighted speculum in the vagina and grasped the anterior lip of the cervix with a tenaculum forcep. The vaginal mucosa is then infiltrated with 1% lidocaine with epinephrine 1 in 200,000. We perform days circular incision around the cervix and bluntly dissect the anterior and posterior vaginal walls. Posterior cul-de-sac is entered which allows [place] to isolate the uterosacral ligaments on each side with Rogers forcep. These ligaments are sectioned and sutured with a transfix suture of 0 Vicryl. We pursue the dissection of the anterior cul-de-sac and are now able to isolate the cardinal ligaments on each side with Rogers forceps. Those ligaments are sectioned and sutured with a transfix suture of 0 Vicryl. We now enter the anterior cul-de-sac and isolate uterine vessels on each side with Rogers forceps. The vessels are sectioned and sutured with a transfix suture of 0 Vicryl. We move one step further before delivering the uterus by isolating part of the broad ligament on each side  sectioning it and suturing it with 0 Vicryl. We are now able to deliver the fundus of the uterus via the posterior cul-de-sac. A 4 cm left fundal fibroid is removed to allow for better placement of clips. The final pedicle is isolated on each side using Rogers forceps and containing broad ligament utero-ovarian ligament tube and round ligaments. That pedicle is sectioned and sutured first with a transfix suture of 0 Vicryl then with a free tie of 0 Vicryl.  We then proceed with systematic review of hemostasis and completed on the left uterosacral ligament with a figure-of-eight stitch of 0 Vicryl and on the left round ligament with the figure-of-eight stitch of 0 Vicryl. Hemostasis was now deemed adequate.  Vaginal suspension sutures are then placed: First on the left reuniting the posterior vaginal mucosa, left uterosacral ligament, anterior peritoneum and posterior vaginal mucosa. We proceed in the same fashion for the right side and will tidal sutures at the end of the procedure. The posterior cul-de-sac is then plicated and closed with the 0 Vicryl suture reuniting the posterior perineum and both uterosacral ligaments. We then proceed with closure of the vaginal mucosa with figure-of-eight stitches of 0 Vicryl. The suspension sutures were then closed and tied. Hemostasis is adequate.  Observation: Both ovaries appear normal except for a 1 cm clear ovarian cyst on the right which was drained. Tubes are too high in the pelvis to be addressed vaginally. The uterus had small fibroids throughout and weighed 106 g.  Instrument and sponge count is complete x2. Estimated blood loss is 50 cc. The procedure is well tolerated by the patient is taken to recovery room in a well and stable condition.  Specimen: Uterus and cervix weighing 106  g sent to pathology

## 2013-06-24 NOTE — Preoperative (Signed)
Beta Blockers   Reason not to administer Beta Blockers:Not Applicable 

## 2013-06-24 NOTE — H&P (View-Only) (Signed)
Chelsey Hunt is a 47 y.o. female P 3-0-0-3 presents for hysterectomy because of pelvic pain, irregular bleeding and fibroid.  The patient underwent a Novasure endometrial ablation in 2012 and was amenorrheic and without pelvic pain until July 2014 when she developed excruciating  pelvic pain that caused her to go to the Maternity Admissions Unit at [facility].  Patient states that the pain was so severe that she almost passed out.  Per patient, she was told after an ultrasound that she had "scar tissue" within her uterus. The following day she began to have menstrual-like bleeding and her pain subsided. Subsequent follow up with a [facility] showed a uterus: 9.16 x 6.10 x 5.21 cm, endometrium: 12.90 cm with a hyper-echoic mass measuring 1.3 x 0.87 x 1.3 cm, posterior fibroid: 3.57 x 3.09 x 3.14 cm and right ovary: 3.96 x 3.40 x 1.67 cm/left ovary: 3.64 x 2.33 x 1.72 cm.  Since her initial episode, the patient has not had any further pelvic pain but states that she never wants to experience that again.  She has had, however, monthly vaginal bleeding for 5 days causing her to change her pad every 3 hours.  There are times she may have to change more frequently because she will have gushes of blood occur.  She denies any cramping with these episodes.  An endometrial biopsy in August 2014 did not show any atypia, hyperplasia or malignancy.  She denies inter-menstrual bleeding, bowel, vaginitis or bladder symptoms.  After reviewed both medical and surgical management options with the patient, she remained certain that she wanted to proceed with hysterectomy.    Past Medical History  OB History: G 3; P 3-0-0-3  SVB 1984, 1987 and 1990  GYN History: menarche: 47 YO   LMP: 8/ 06/2013;   Contracepton bilateral tubal ligation  The patient reports a past history of: trichomonas.  Has a remote  history of abnormal PAP smear that was treated with cryotherapy; Last PAP smear 03/2013-normal  Medical  History: Anemia, thyroid disease (had thyroid nodule irradiated), anemia, GERD,  IBS and heart murmur  Surgical History:  1990 Tubal Sterilization;   2011 Hysteroscopic Polypectomy;  2012 Endometrial Ablation (Novasure) Denies problems with anesthesia except that she is slow to awaken  No  history of blood transfusions  Family History: Thyroid disease, cardiovascular disease, asthma, hypertension, throat and lung cancers  Social History:   Married and employed at [employer];  Denies tobacco use, occasional alcohol use and denies illicit drug use   Medications:  Cyclobenzaprine 10 mg   tid prn Vicodin 5/325  every 4 hours prn Levothyroxine 88 mcg daily Pantoprazole 40 mg daily prn Promethazine 25 mg  prn Relpax 40 mg as directed  prn Sulindac 200 mg as directed prn  Allergies  Allergen Reactions  . Propoxyphene-Acetaminophen   . Nabumetone Rash    REACTION: GI upset  . Topiramate Rash    REACTION: rash    Denies sensitivity to peanuts, shellfish, soy, latex or adhesives.   ROS: Admits to glasses, tinnitus, constipation, migraine headaches and random leaking of urine with sneezing;   Denies  vision changes, nasal congestion, dysphagia, dizziness, hoarseness, cough,  chest pain, shortness of breath, nausea, vomiting, diarrhea, urinary frequency, urgency  dysuria, hematuria, vaginitis symptoms, pelvic pain, swelling of joints,easy bruising,  myalgias, arthralgias, skin rashes, unexplained weight loss and except as is mentioned in the history of present illness, patient's review of systems is otherwise negative.  Physical Exam  Bp:   112/88    P: 64    R: 12   Temperature: 98.9 degrees F orally   Weight: 139 lbs.  Height: 5'    BMI: 27.1  Neck: supple without masses or thyromegaly Lungs: clear to auscultation Heart: regular rate and rhythm Abdomen: soft, non-tender and no organomegaly Pelvic:EGBUS- wnl; vagina-normal rugae; uterus-normal size, cervix without  lesions or motion tenderness; adnexae-no tenderness or masses Extremities:  no clubbing, cyanosis or edema  Assesment:  Irregular Bleeding                       Pelvic Pain                       Uterine Fibroid   Disposition:  A discussion was held with patient regarding the indication for her procedure(s) along with the risks, which include but are not limited to: reaction to anesthesia, damage to adjacent organs, infection, pelvic prolapse and excessive bleeding. The patient verbalized understanding of these risks and has consented to proceed with Total Vaginal Hysterectomy with Possible Salpingectomy at [facility] 06/24/13 @8:30 a.m.    CSN# [id number]   Millie Shorb J. Shantoria Ellwood, PA-C  for Dr. Sandra A. Rivard   

## 2013-06-24 NOTE — Anesthesia Preprocedure Evaluation (Signed)
Anesthesia Evaluation  Patient identified by MRN, date of birth, ID band Patient awake    Reviewed: Allergy & Precautions, H&P , NPO status , Patient's Chart, lab work & pertinent test results  Airway Mallampati: I TM Distance: >3 FB Neck ROM: full    Dental no notable dental hx. (+) Teeth Intact   Pulmonary neg pulmonary ROS,    Pulmonary exam normal       Cardiovascular negative cardio ROS      Neuro/Psych negative psych ROS   GI/Hepatic Neg liver ROS, GERD-  Medicated and Poorly Controlled,  Endo/Other    Renal/GU negative Renal ROS     Musculoskeletal   Abdominal Normal abdominal exam  (+) - obese,   Peds  Hematology negative hematology ROS (+)   Anesthesia Other Findings   Reproductive/Obstetrics negative OB ROS                           Anesthesia Physical Anesthesia Plan  ASA: II  Anesthesia Plan: General   Post-op Pain Management:    Induction: Intravenous  Airway Management Planned: Oral ETT  Additional Equipment:   Intra-op Plan:   Post-operative Plan: Extubation in OR  Informed Consent: I have reviewed the patients History and Physical, chart, labs and discussed the procedure including the risks, benefits and alternatives for the proposed anesthesia with the patient or authorized representative who has indicated his/her understanding and acceptance.   Dental Advisory Given  Plan Discussed with: CRNA and Surgeon  Anesthesia Plan Comments:         Anesthesia Quick Evaluation

## 2013-06-24 NOTE — Anesthesia Postprocedure Evaluation (Signed)
  Anesthesia Post-op Note  Patient: Chelsey Hunt  Procedure(s) Performed: Procedure(s): TOTAL HYSTERECTOMY VAGINAL (N/A)  Patient Location: PACU and Women's Unit  Anesthesia Type:General  Level of Consciousness: awake, alert , oriented and patient cooperative  Airway and Oxygen Therapy: Patient Spontanous Breathing  Post-op Pain: none  Post-op Assessment: Post-op Vital signs reviewed, Patient's Cardiovascular Status Stable, PATIENT'S CARDIOVASCULAR STATUS UNSTABLE, No signs of Nausea or vomiting, Adequate PO intake and Pain level controlled  Post-op Vital Signs: Reviewed and stable  Complications: No apparent anesthesia complications

## 2013-06-24 NOTE — Discharge Summary (Signed)
  Physician Discharge Summary  Patient ID: Chelsey Hunt MRN: [id number] DOB/AGE: 09/30/1965 47 y.o.  Admit date: 06/24/2013 Discharge date: 06/24/2013  Admission Diagnoses: Menorrhagia with fibroids  Discharge Diagnoses: Menorrhagia with fibroids          Discharged Condition: good  Hospital Course:   Consults: None  Treatments: surgery: total vaginal hysterectomy on 06/24/13  Disposition: 01-Home or Self Care     Medication List         B-2 100 MG Tabs  Take 1 tablet by mouth daily.     cetirizine 10 MG tablet  Commonly known as:  ZYRTEC  1 tablet as needed for allergies     [employer] GAS RELIEF ULTRA STRENGTH 180 MG Caps  Generic drug:  Simethicone  Take by mouth as needed.     eletriptan 40 MG tablet  Commonly known as:  RELPAX  One tablet by mouth as needed for migraine headache.  If the headache improves and then returns, dose may be repeated after 2 hours have elapsed since first dose (do not exceed 80 mg per day). may repeat in 2 hours if necessary     FLINSTONES GUMMIES OMEGA-3 DHA Chew  Chew by mouth daily.     ibuprofen 600 MG tablet  Commonly known as:  ADVIL,MOTRIN  1 po pc evry 6 hours for 5 days then as needed for pain     levothyroxine 88 MCG tablet  Commonly known as:  SYNTHROID, LEVOTHROID  Take 88 mcg by mouth daily before breakfast.     oxyCODONE-acetaminophen 5-325 MG per tablet  Commonly known as:  PERCOCET/ROXICET  Take 1 tablet by mouth every 4 (four) hours as needed for pain.     pantoprazole 40 MG tablet  Commonly known as:  PROTONIX  Take 40 mg by mouth 2 (two) times daily.     promethazine 25 MG tablet  Commonly known as:  PHENERGAN  Take 25 mg by mouth every 4 (four) hours as needed. For nausea           Follow-up Information   Follow up with [facility], MD On 08/04/2013. (Appointment time is 10 a. m.)    Specialty:  Obstetrics and Gynecology   Contact information:   [address] [place]  [address] [phone]       Signed: Esmeralda Arthur, MD 06/24/2013, 10:04 PM

## 2013-06-24 NOTE — Anesthesia Postprocedure Evaluation (Signed)
  Anesthesia Post-op Note  Patient: Chelsey Hunt  Procedure(s) Performed: Procedure(s): TOTAL HYSTERECTOMY VAGINAL (N/A) Patient is awake and responsive. Pain and nausea are reasonably well controlled. Vital signs are stable and clinically acceptable. Oxygen saturation is clinically acceptable. There are no apparent anesthetic complications at this time. Patient is ready for discharge.

## 2013-06-24 NOTE — Transfer of Care (Signed)
Immediate Anesthesia Transfer of Care Note  Patient: Chelsey Hunt  Procedure(s) Performed: Procedure(s): TOTAL HYSTERECTOMY VAGINAL (N/A)  Patient Location: PACU  Anesthesia Type:General  Level of Consciousness: awake, alert  and oriented  Airway & Oxygen Therapy: Patient Spontanous Breathing and Patient connected to nasal cannula oxygen  Post-op Assessment: Report given to PACU RN and Post -op Vital signs reviewed and stable  Post vital signs: Reviewed and stable  Complications: No apparent anesthesia complications

## 2013-06-25 ENCOUNTER — Encounter (HOSPITAL_COMMUNITY): Payer: Self-pay | Admitting: Obstetrics and Gynecology

## 2013-10-23 ENCOUNTER — Other Ambulatory Visit (INDEPENDENT_AMBULATORY_CARE_PROVIDER_SITE_OTHER): Payer: 59

## 2013-10-23 ENCOUNTER — Encounter: Payer: Self-pay | Admitting: Internal Medicine

## 2013-10-23 ENCOUNTER — Ambulatory Visit (INDEPENDENT_AMBULATORY_CARE_PROVIDER_SITE_OTHER): Payer: 59 | Admitting: Internal Medicine

## 2013-10-23 VITALS — BP 110/68 | HR 80 | Temp 98.2°F | Resp 16 | Ht 60.0 in | Wt 136.0 lb

## 2013-10-23 DIAGNOSIS — D509 Iron deficiency anemia, unspecified: Secondary | ICD-10-CM

## 2013-10-23 DIAGNOSIS — E890 Postprocedural hypothyroidism: Secondary | ICD-10-CM

## 2013-10-23 DIAGNOSIS — G44229 Chronic tension-type headache, not intractable: Secondary | ICD-10-CM

## 2013-10-23 DIAGNOSIS — R7989 Other specified abnormal findings of blood chemistry: Secondary | ICD-10-CM

## 2013-10-23 DIAGNOSIS — Z0000 Encounter for general adult medical examination without abnormal findings: Secondary | ICD-10-CM

## 2013-10-23 DIAGNOSIS — R011 Cardiac murmur, unspecified: Secondary | ICD-10-CM

## 2013-10-23 DIAGNOSIS — K589 Irritable bowel syndrome without diarrhea: Secondary | ICD-10-CM

## 2013-10-23 LAB — CBC WITH DIFFERENTIAL/PLATELET
BASOS PCT: 0.6 % (ref 0.0–3.0)
Basophils Absolute: 0 10*3/uL (ref 0.0–0.1)
EOS PCT: 6 % — AB (ref 0.0–5.0)
Eosinophils Absolute: 0.3 10*3/uL (ref 0.0–0.7)
HCT: 43.2 % (ref 36.0–46.0)
Hemoglobin: 14 g/dL (ref 12.0–15.0)
Lymphocytes Relative: 44.3 % (ref 12.0–46.0)
Lymphs Abs: 2.5 10*3/uL (ref 0.7–4.0)
MCHC: 32.5 g/dL (ref 30.0–36.0)
MCV: 94.4 fl (ref 78.0–100.0)
MONO ABS: 0.4 10*3/uL (ref 0.1–1.0)
MONOS PCT: 7.4 % (ref 3.0–12.0)
NEUTROS PCT: 41.7 % — AB (ref 43.0–77.0)
Neutro Abs: 2.3 10*3/uL (ref 1.4–7.7)
PLATELETS: 411 10*3/uL — AB (ref 150.0–400.0)
RBC: 4.58 Mil/uL (ref 3.87–5.11)
RDW: 13.3 % (ref 11.5–14.6)
WBC: 5.6 10*3/uL (ref 4.5–10.5)

## 2013-10-23 LAB — URINALYSIS, ROUTINE W REFLEX MICROSCOPIC
Bilirubin Urine: NEGATIVE
Hgb urine dipstick: NEGATIVE
Ketones, ur: NEGATIVE
Leukocytes, UA: NEGATIVE
NITRITE: NEGATIVE
PH: 8.5 — AB (ref 5.0–8.0)
RBC / HPF: NONE SEEN (ref 0–?)
Specific Gravity, Urine: 1.01 (ref 1.000–1.030)
TOTAL PROTEIN, URINE-UPE24: NEGATIVE
Urine Glucose: NEGATIVE
Urobilinogen, UA: 0.2 (ref 0.0–1.0)
WBC UA: NONE SEEN (ref 0–?)

## 2013-10-23 LAB — COMPREHENSIVE METABOLIC PANEL
ALK PHOS: 80 U/L (ref 39–117)
ALT: 46 U/L — ABNORMAL HIGH (ref 0–35)
AST: 39 U/L — ABNORMAL HIGH (ref 0–37)
Albumin: 3.9 g/dL (ref 3.5–5.2)
BUN: 15 mg/dL (ref 6–23)
CO2: 26 meq/L (ref 19–32)
Calcium: 10 mg/dL (ref 8.4–10.5)
Chloride: 102 mEq/L (ref 96–112)
Creatinine, Ser: 0.9 mg/dL (ref 0.4–1.2)
GFR: 84.71 mL/min (ref >60.00)
GLUCOSE: 51 mg/dL — AB (ref 70–99)
Potassium: 3.7 mEq/L (ref 3.5–5.1)
SODIUM: 138 meq/L (ref 135–145)
TOTAL PROTEIN: 7.4 g/dL (ref 6.0–8.3)
Total Bilirubin: 0.5 mg/dL (ref 0.3–1.2)

## 2013-10-23 LAB — FERRITIN: Ferritin: 14.9 ng/mL (ref 10.0–291.0)

## 2013-10-23 LAB — LIPID PANEL
Cholesterol: 202 mg/dL — ABNORMAL HIGH (ref 0–200)
HDL: 52.7 mg/dL (ref >39.00)
TRIGLYCERIDES: 94 mg/dL (ref 0.0–149.0)
Total CHOL/HDL Ratio: 4
VLDL: 18.8 mg/dL (ref 0.0–40.0)

## 2013-10-23 LAB — IBC PANEL
Iron: 67 ug/dL (ref 42–145)
Saturation Ratios: 17.7 % — ABNORMAL LOW (ref 20.0–50.0)
TRANSFERRIN: 270.7 mg/dL (ref 212.0–360.0)

## 2013-10-23 LAB — TSH: TSH: 0.57 u[IU]/mL (ref 0.35–5.50)

## 2013-10-23 LAB — LDL CHOLESTEROL, DIRECT: Direct LDL: 133.5 mg/dL

## 2013-10-23 NOTE — Assessment & Plan Note (Signed)
Her EKG is normal She has some concerning s/s so I have ordered an ECHO to see if she has developed a valvular problem

## 2013-10-23 NOTE — Patient Instructions (Signed)
Heart Murmur A heart murmur is an extra sound heard by your health care provider when listening to your heart with a device called a stethoscope. The sound comes from turbulence when blood flows through the heart and may be a "hum" or "whoosh" sound heard when the heart beats. There are two types of heart murmurs:  Innocent murmurs Most people with this type of heart murmur do not have a heart problem. Many children have innocent heart murmurs. Your health care provider may suggest some basic testing to know whether your murmur is an innocent murmur. If an innocent heart murmur is found, there is no need for further tests or treatment and no need to restrict activities or stop playing sports.  Abnormal murmurs These types of murmurs can occur in children and adults. In children, abnormal heart murmurs are typically caused from heart defects that are present at birth (congenital). In adults, abnormal murmurs are usually from heart valve problems caused by disease, infection, or aging. CAUSES  All heart murmurs are a result of an issue with your heart valves. Normally, these valves open to let blood flow through or out of your heart and then shut to keep it from flowing backward. If they do not work properly, you could have:  Regurgitation When blood leaks back through the valve in the wrong direction.  Mitral valve prolapse When the mitral valve of the heart has a loose flap and does not close tightly.  Stenosis When the valve does not open enough and blocks blood flow. SIGNS AND SYMPTOMS  Innocent murmurs do not cause symptoms, and many people with abnormal murmurs may or may not have symptoms. If symptoms do develop, they may include:  Shortness of breath.  Blue coloring of the skin, especially on the fingertips.  Chest pain.  Palpitations, or feeling a fluttering or skipped heartbeat.  Fainting.  Persistent cough.  Getting tired much faster than expected. DIAGNOSIS  A heart murmur  might be heard during a sports physical or during any type of examination. When a murmur is heard, it may suggest a possible problem. When this happens, your health care provider may ask you to see a heart specialist (cardiologist). You may also be asked to have one or more heart tests. In these cases, testing may vary depending on what your health care provider heard. Tests for a heart murmur may include:  Electrocardiogram.  Echocardiogram.  MRI. For children and adults who have an abnormal heart murmur and want to play sports, it is important to complete testing, review test results, and receive recommendations from your health care provider. If heart disease is present, it may not be safe to play. TREATMENT  Innocent murmurs require no treatment or activity restriction. If an abnormal murmur represents a problem with the heart, treatment will depend on the exact nature of the problem. In these cases, medicine or surgery may be needed to treat the problem. HOME CARE INSTRUCTIONS If you want to participate in sports or other types of strenuous physical activity, it is important to discuss this first with your health care provider. If the murmur represents a problem with the heart and you choose to participate in sports, there is a small chance that a serious problem (including sudden death) could result.  SEEK MEDICAL CARE IF:   You feel that your symptoms are slowly worsening.  You develop any new symptoms that cause concern.  You feel that you are having side effects from any medicines prescribed. SEEK IMMEDIATE  MEDICAL CARE IF:   You develop chest pain.  You have shortness of breath.  You notice that your heart beats irregularly often enough to cause you to worry.  You have fainting spells.  Your symptoms suddenly get worse. Document Released: 10/04/2004 Document Revised: 06/17/2013 Document Reviewed: 05/04/2013 [facility] Patient Information 2014 [place].

## 2013-10-23 NOTE — Assessment & Plan Note (Signed)
CBC today.  

## 2013-10-23 NOTE — Progress Notes (Signed)
Subjective:    Patient ID: Chelsey Hunt, female    DOB: [birth year], 48 y.o.   MRN: [id number]  Anemia Presents for follow-up visit. Symptoms include light-headedness. There has been no abdominal pain, anorexia, bruising/bleeding easily, confusion, fever, leg swelling, malaise/fatigue, pallor, palpitations, paresthesias, pica or weight loss. Signs of blood loss that are not present include hematemesis, hematochezia, melena, menorrhagia and vaginal bleeding.      Review of Systems  Constitutional: Negative.  Negative for fever, chills, weight loss, malaise/fatigue, diaphoresis, appetite change and fatigue.  HENT: Negative.   Eyes: Negative.   Respiratory: Negative.  Negative for cough, choking, chest tightness, shortness of breath, wheezing and stridor.   Cardiovascular: Negative.  Negative for chest pain, palpitations and leg swelling.       She has had some dizziness and lightheadedness with exercise.  Gastrointestinal: Negative.  Negative for nausea, vomiting, abdominal pain, diarrhea, constipation, blood in stool, melena, hematochezia, anorexia and hematemesis.  Endocrine: Negative.   Genitourinary: Negative.  Negative for vaginal bleeding and menorrhagia.  Musculoskeletal: Negative.   Skin: Negative.  Negative for pallor.  Allergic/Immunologic: Negative.   Neurological: Positive for dizziness and light-headedness. Negative for tremors, seizures, syncope, facial asymmetry, speech difficulty, weakness, numbness, headaches and paresthesias.  Hematological: Negative.  Negative for adenopathy. Does not bruise/bleed easily.  Psychiatric/Behavioral: Negative.  Negative for confusion.       Objective:   Physical Exam  Vitals reviewed. Constitutional: She is oriented to person, place, and time. She appears well-developed and well-nourished.  Non-toxic appearance. She does not have a sickly appearance. She does not appear ill. No distress.  HENT:  Head: Normocephalic and  atraumatic.  Mouth/Throat: Oropharynx is clear and moist. No oropharyngeal exudate.  Eyes: Conjunctivae are normal. Right eye exhibits no discharge. Left eye exhibits no discharge. No scleral icterus.  Neck: Normal range of motion. Neck supple. No JVD present. No tracheal deviation present. No thyromegaly present.  Cardiovascular: Normal rate, regular rhythm, S1 normal, S2 normal and intact distal pulses.  Exam reveals no gallop, no S3, no S4, no distant heart sounds and no friction rub.   Murmur heard.  Decrescendo systolic murmur is present with a grade of 2/6   No diastolic murmur is present  Pulses:      Carotid pulses are 1+ on the right side, and 1+ on the left side.      Radial pulses are 1+ on the right side, and 1+ on the left side.       Femoral pulses are 1+ on the right side, and 1+ on the left side.      Popliteal pulses are 1+ on the right side, and 1+ on the left side.       Dorsalis pedis pulses are 1+ on the right side, and 1+ on the left side.       Posterior tibial pulses are 1+ on the right side, and 1+ on the left side.  Pulmonary/Chest: Effort normal and breath sounds normal. No stridor. No respiratory distress. She has no wheezes. She has no rales. She exhibits no tenderness.  Abdominal: Soft. Bowel sounds are normal. She exhibits no distension and no mass. There is no tenderness. There is no rebound and no guarding.  Musculoskeletal: Normal range of motion. She exhibits no edema and no tenderness.  Lymphadenopathy:    She has no cervical adenopathy.  Neurological: She is oriented to person, place, and time.  Skin: Skin is warm and dry. No rash noted. She is not  diaphoretic. No erythema. No pallor.  Psychiatric: She has a normal mood and affect. Her behavior is normal. Judgment and thought content normal.     Lab Results  Component Value Date   WBC 5.9 06/15/2013   HGB 12.8 06/15/2013   HCT 38.8 06/15/2013   PLT 369 06/15/2013   GLUCOSE 94 06/15/2013   CHOL 189  07/30/2012   TRIG 53.0 07/30/2012   HDL 56.60 07/30/2012   LDLCALC 122* 07/30/2012   ALT 11 04/21/2013   AST 15 04/21/2013   NA 139 06/15/2013   K 3.8 06/15/2013   CL 101 06/15/2013   CREATININE 1.05 06/15/2013   BUN 14 06/15/2013   CO2 28 06/15/2013   TSH 0.28* 09/23/2012       Assessment & Plan:

## 2013-10-23 NOTE — Assessment & Plan Note (Signed)
Exam done Vaccines were reviewed Labs ordered Pt ed material was given 

## 2013-10-23 NOTE — Assessment & Plan Note (Signed)
I will recheck her TSH today 

## 2013-10-24 ENCOUNTER — Encounter: Payer: Self-pay | Admitting: Internal Medicine

## 2013-10-29 ENCOUNTER — Telehealth: Payer: Self-pay | Admitting: *Deleted

## 2013-10-29 NOTE — Telephone Encounter (Signed)
Patient phoned requesting recent test results (10/23/13).  Please advise  CB# [phone]

## 2013-11-01 NOTE — Telephone Encounter (Signed)
A note was sent in mychart Her liver enzymes were elevated and she needs to return for further studies

## 2013-11-02 NOTE — Telephone Encounter (Signed)
Notified patient of MD response & recommendations-transferred her to scheduling for f/u appt (no new labs ordered in system)

## 2013-11-05 ENCOUNTER — Ambulatory Visit: Payer: 59 | Admitting: Internal Medicine

## 2013-11-11 ENCOUNTER — Other Ambulatory Visit (INDEPENDENT_AMBULATORY_CARE_PROVIDER_SITE_OTHER): Payer: 59

## 2013-11-11 ENCOUNTER — Encounter: Payer: Self-pay | Admitting: Internal Medicine

## 2013-11-11 ENCOUNTER — Ambulatory Visit (INDEPENDENT_AMBULATORY_CARE_PROVIDER_SITE_OTHER): Payer: 59 | Admitting: Internal Medicine

## 2013-11-11 VITALS — BP 108/64 | HR 90 | Temp 97.8°F | Resp 16 | Ht 60.0 in | Wt 135.0 lb

## 2013-11-11 DIAGNOSIS — R7401 Elevation of levels of liver transaminase levels: Secondary | ICD-10-CM | POA: Insufficient documentation

## 2013-11-11 DIAGNOSIS — R740 Nonspecific elevation of levels of transaminase and lactic acid dehydrogenase [LDH]: Principal | ICD-10-CM

## 2013-11-11 DIAGNOSIS — R7402 Elevation of levels of lactic acid dehydrogenase (LDH): Secondary | ICD-10-CM | POA: Insufficient documentation

## 2013-11-11 LAB — COMPREHENSIVE METABOLIC PANEL
ALK PHOS: 76 U/L (ref 39–117)
ALT: 34 U/L (ref 0–35)
AST: 47 U/L — ABNORMAL HIGH (ref 0–37)
Albumin: 3.7 g/dL (ref 3.5–5.2)
BUN: 12 mg/dL (ref 6–23)
CALCIUM: 9.7 mg/dL (ref 8.4–10.5)
CHLORIDE: 104 meq/L (ref 96–112)
CO2: 28 meq/L (ref 19–32)
Creatinine, Ser: 0.9 mg/dL (ref 0.4–1.2)
GFR: 83.63 mL/min (ref >60.00)
GLUCOSE: 88 mg/dL (ref 70–99)
POTASSIUM: 4.3 meq/L (ref 3.5–5.1)
SODIUM: 139 meq/L (ref 135–145)
TOTAL PROTEIN: 7.1 g/dL (ref 6.0–8.3)
Total Bilirubin: 0.4 mg/dL (ref 0.3–1.2)

## 2013-11-11 LAB — HEPATITIS A ANTIBODY, TOTAL: Hep A Total Ab: NONREACTIVE

## 2013-11-11 LAB — HEPATITIS C ANTIBODY: HCV Ab: NEGATIVE

## 2013-11-11 LAB — HEPATITIS B CORE ANTIBODY, TOTAL: HEP B C TOTAL AB: NONREACTIVE

## 2013-11-11 LAB — HEPATITIS B SURFACE ANTIBODY,QUALITATIVE: Hep B S Ab: POSITIVE — AB

## 2013-11-11 NOTE — Progress Notes (Signed)
Subjective:    Patient ID: Chelsey Hunt, female    DOB: 13-Jun-1966, 48 y.o.   MRN: [id number]  HPI Comments: She returns for f/up on recent labs that showed a slight elevation in her liver enzymes. She feels well and offers no complaints.     Review of Systems  Constitutional: Negative.  Negative for fever, chills, diaphoresis, activity change, appetite change, fatigue and unexpected weight change.  HENT: Negative.   Eyes: Negative.   Respiratory: Negative.  Negative for cough, choking, chest tightness, shortness of breath and stridor.   Cardiovascular: Negative.  Negative for chest pain, palpitations and leg swelling.  Gastrointestinal: Negative.  Negative for nausea, vomiting, abdominal pain, diarrhea and constipation.  Endocrine: Negative.   Genitourinary: Negative.   Musculoskeletal: Negative.  Negative for arthralgias and myalgias.  Skin: Negative.   Allergic/Immunologic: Negative.   Neurological: Negative.   Hematological: Negative.  Negative for adenopathy. Does not bruise/bleed easily.  Psychiatric/Behavioral: Negative.        Objective:   Physical Exam  Vitals reviewed. Constitutional: She is oriented to person, place, and time. She appears well-developed and well-nourished. No distress.  HENT:  Head: Normocephalic and atraumatic.  Mouth/Throat: Oropharynx is clear and moist. No oropharyngeal exudate.  Eyes: Conjunctivae are normal. Right eye exhibits no discharge. Left eye exhibits no discharge. No scleral icterus.  Neck: Normal range of motion. Neck supple. No JVD present. No tracheal deviation present. No thyromegaly present.  Cardiovascular: Normal rate, regular rhythm and intact distal pulses.  Exam reveals no gallop and no friction rub.   Murmur heard.  Decrescendo systolic murmur is present with a grade of 1/6   No diastolic murmur is present  Pulses:      Carotid pulses are 1+ on the right side, and 1+ on the left side.      Radial pulses are 1+ on  the right side, and 1+ on the left side.       Femoral pulses are 1+ on the right side, and 1+ on the left side.      Popliteal pulses are 1+ on the right side, and 1+ on the left side.       Dorsalis pedis pulses are 1+ on the right side, and 1+ on the left side.       Posterior tibial pulses are 1+ on the right side, and 1+ on the left side.  Pulmonary/Chest: Effort normal and breath sounds normal. No stridor. No respiratory distress. She has no wheezes. She has no rales. She exhibits no tenderness.  Abdominal: Soft. Bowel sounds are normal. She exhibits no distension and no mass. There is no tenderness. There is no rebound and no guarding.  Musculoskeletal: Normal range of motion. She exhibits no edema and no tenderness.  Lymphadenopathy:    She has no cervical adenopathy.  Neurological: She is oriented to person, place, and time.  Skin: Skin is warm and dry. No rash noted. She is not diaphoretic. No erythema. No pallor.     Lab Results  Component Value Date   WBC 5.6 10/23/2013   HGB 14.0 10/23/2013   HCT 43.2 10/23/2013   PLT 411.0* 10/23/2013   GLUCOSE 51* 10/23/2013   CHOL 202* 10/23/2013   TRIG 94.0 10/23/2013   HDL 52.70 10/23/2013   LDLDIRECT 133.5 10/23/2013   LDLCALC 122* 07/30/2012   ALT 46* 10/23/2013   AST 39* 10/23/2013   NA 138 10/23/2013   K 3.7 10/23/2013   CL 102 10/23/2013   CREATININE  0.9 10/23/2013   BUN 15 10/23/2013   CO2 26 10/23/2013   TSH 0.57 10/23/2013       Assessment & Plan:

## 2013-11-11 NOTE — Patient Instructions (Signed)
Liver Panel A liver panel, also known as liver (hepatic) function tests or LFT, is used to detect liver damage or disease. One or more of these tests are ordered when symptoms suspicious of a liver condition are noticed. These include: jaundice, dark urine, or light-colored bowel movements; nausea, vomiting and/or diarrhea; loss of appetite; vomiting of blood; bloody or black bowel movements; swelling or pain in the belly; unusual weight change; or fatigue or loss of stamina. One or more of these tests may also be ordered when a person has been or may have been exposed to a hepatitis virus; has a family history of liver disease; has excessive alcohol intake; or is taking a drug that can cause liver damage. A liver panel usually includes 7 tests that are run at the same time on a blood sample. These include:  Alanine aminotransferase (ALT)  an enzyme mainly found in the liver; the best test for detecting hepatitis.  NORMAL FINDINGS  Elderly: may be slightly higher than adult values  Adult/child: 4-36 international units/L at 37 C or4-36 units/L (SI units)  Values may be higher in men and in African Americans.  Infant: may be twice as high as adult values. Alkaline phosphatase (ALP)  an enzyme related to the bile ducts; often increased when they are blocked NORMAL FINDINGS  Elderly: slightly higher than an adult.  Adult: 30-120 units/L or 0.5-2.0 microKat/L (SI units)  Child:/adolescent:  Less than 2 years: 85-235 units/L  2-8 Years: 65-210 units/L  9-15 years: 60-300 units/L  16-21 years: 30-200 units/L Aspartate aminotransferase (AST)  an enzyme found in the liver and a few other places, particularly the heart and other muscles in the body. NORMAL FINDINGS  Age / Normal value (units)  0-5 days / 35-140  Less than 3 yr / 15-60  3-6 yr / 15-50  6-12 yr / 10-50  12-18 yr / 10-40  Adult / 0-35 units/L or 0-0.58 microKat/L (SI units) (Females tend to have slightly lower than  males)  Elderly / Slightly higher than adults. Bilirubin  two different tests of bilirubin often used together (especially if a person has jaundice): total bilirubin measures all the bilirubin in the blood; direct bilirubin measures a form made in the liver.   Adult/elderly/child  Total bilirubin: 0.3-1.0 mg/dL or 5.1-17 micromole/L (SI units)  Indirect bilirubin: 0.2-0.8 mg/dL or 3.4-12.0 micromole/L (SI units)  Direct bilirubin: 0.1-0.3 mg/dL or 1.7-5.1 micromole/L (SI units)  Newborn total bilirubin:1.0-12.0 mg/dL or17.1-205 micromole/L (SI units)  Urine: 0.0-0.2 mg/dL Albumin  measures the main protein made by the liver and tells how well the liver is making this protein  Total Protein - measures albumin and all other proteins in blood, including antibodies made to help fight off infections.  NORMAL FINDINGS  Adult/Elderly  Total protein: 6.4-8.3 g/dL or 64-83 g/L (SI units)  Albumin: 3.5-5 g/dL or 35-50 g/L (SI units)  Globulin: 2.3-3.4 g/dL  Alpha1 globulin: 0.1-0.3 g/dL or 1-3 g/L (SI units)  Alpha2 globulin: 0.6-1 g/dL or 6-10 g/L (SI units)  Beta globulin: 0.7-1.1 g/dL or 7-11 g/L (SI units) Children  Total protein  Premature infant: 4.2-7.6 g/dL  Newborn: 4.6-7.4 g/dL  Infant: 6-6.7 g/dL  Child: 6.2-8 g/dL  Albumin  Premature infant: 3-4.2 g/dL  Newborn: 3.5-5.4 g/dL  Infant: 4.4-5.4 g/dL  Child: 4-5.9 g/dL Ranges for normal findings may vary among different laboratories and hospitals. You should always check with your doctor after having lab work or other tests done to discuss the meaning of your   test results and whether your values are considered within normal limits. MEANING OF TEST  Your caregiver will go over the test results with you and discuss the importance and meaning of your results, as well as treatment options and the need for additional tests if necessary. OBTAINING THE TEST RESULTS It is your responsibility to obtain your test results.  Ask the lab or department performing the test when and how you will get your results. Document Released: 09/21/2004 Document Revised: 11/19/2011 Document Reviewed: 08/09/2008 [employer] Patient Information 2014 [employer].  

## 2013-11-11 NOTE — Progress Notes (Signed)
Pre visit review using our clinic review tool, if applicable. No additional management support is needed unless otherwise documented below in the visit note. 

## 2013-11-11 NOTE — Assessment & Plan Note (Signed)
After a review of all of her info I think that this is caused by the ibuprofen, she will stop that for now I will recheck her LFT's today and will screen her for viral hepatitis Also, I have ordered an Abd U/S to see if she has fatty liver

## 2013-11-12 ENCOUNTER — Encounter: Payer: Self-pay | Admitting: Internal Medicine

## 2013-11-14 ENCOUNTER — Other Ambulatory Visit (HOSPITAL_COMMUNITY): Payer: 59

## 2013-12-25 ENCOUNTER — Telehealth: Payer: Self-pay | Admitting: *Deleted

## 2013-12-25 NOTE — Telephone Encounter (Addendum)
Patient left vm requesting call back concerning Abdominal US. Pt stated that she has not gotten [place] yet because she wants to know what kind of x-ray she is getting. Pt stated that her insurance will charge her $500 if the x-ray is not covered. Returned pt's call and left vm for to call back.

## 2014-01-19 ENCOUNTER — Telehealth: Payer: Self-pay | Admitting: *Deleted

## 2014-01-19 NOTE — Telephone Encounter (Signed)
Pt called requesting to have Abd [place] be performed at [facility] or [place] due to her being an employee at [employer].

## 2014-01-27 ENCOUNTER — Telehealth: Payer: Self-pay | Admitting: *Deleted

## 2014-01-27 DIAGNOSIS — R7401 Elevation of levels of liver transaminase levels: Secondary | ICD-10-CM

## 2014-01-27 DIAGNOSIS — R740 Nonspecific elevation of levels of transaminase and lactic acid dehydrogenase [LDH]: Principal | ICD-10-CM

## 2014-01-27 DIAGNOSIS — R7402 Elevation of levels of lactic acid dehydrogenase (LDH): Secondary | ICD-10-CM

## 2014-01-27 NOTE — Telephone Encounter (Signed)
Pt left msg on triage requesting status on her ultrasound....Chelsey Hunt

## 2014-01-28 NOTE — Telephone Encounter (Signed)
Pt has been notified once referral been set-up will get a call bak from [facility] with appt, date and time...Chelsey Hunt

## 2014-01-28 NOTE — Telephone Encounter (Signed)
Called pt back ask her about u/s. She stated when someone called her and told her that the u/s was set up @ [facility]. They was going to charge her $500 copay, and she didn't have. She told them it would be cheaper either at [facility] or [facility] to have done. Pt is wanting to have that option. Will need new referral.../lmb

## 2014-01-28 NOTE — Telephone Encounter (Signed)
done

## 2014-01-28 NOTE — Telephone Encounter (Signed)
abd u/s was ordered over 2 months ago

## 2014-02-05 ENCOUNTER — Ambulatory Visit (HOSPITAL_COMMUNITY)
Admission: RE | Admit: 2014-02-05 | Discharge: 2014-02-05 | Disposition: A | Discharge status: Home / Self Care | Payer: 59 | Source: Ambulatory Visit | Attending: Internal Medicine | Admitting: Internal Medicine

## 2014-02-05 ENCOUNTER — Encounter: Payer: Self-pay | Admitting: Internal Medicine

## 2014-02-05 DIAGNOSIS — R7401 Elevation of levels of liver transaminase levels: Secondary | ICD-10-CM

## 2014-02-05 DIAGNOSIS — R740 Nonspecific elevation of levels of transaminase and lactic acid dehydrogenase [LDH]: Secondary | ICD-10-CM

## 2014-02-05 DIAGNOSIS — R748 Abnormal levels of other serum enzymes: Secondary | ICD-10-CM | POA: Insufficient documentation

## 2014-02-11 ENCOUNTER — Ambulatory Visit: Payer: 59 | Admitting: Internal Medicine

## 2014-02-17 ENCOUNTER — Ambulatory Visit (INDEPENDENT_AMBULATORY_CARE_PROVIDER_SITE_OTHER): Payer: 59 | Admitting: Internal Medicine

## 2014-02-17 ENCOUNTER — Encounter: Payer: Self-pay | Admitting: Internal Medicine

## 2014-02-17 VITALS — BP 112/72 | HR 68 | Temp 98.0°F | Resp 16 | Ht 60.0 in | Wt 129.4 lb

## 2014-02-17 DIAGNOSIS — R7402 Elevation of levels of lactic acid dehydrogenase (LDH): Secondary | ICD-10-CM

## 2014-02-17 DIAGNOSIS — Z23 Encounter for immunization: Secondary | ICD-10-CM

## 2014-02-17 DIAGNOSIS — E890 Postprocedural hypothyroidism: Secondary | ICD-10-CM

## 2014-02-17 DIAGNOSIS — R740 Nonspecific elevation of levels of transaminase and lactic acid dehydrogenase [LDH]: Secondary | ICD-10-CM

## 2014-02-17 DIAGNOSIS — R7401 Elevation of levels of liver transaminase levels: Secondary | ICD-10-CM

## 2014-02-17 NOTE — Progress Notes (Signed)
   Subjective:    Patient ID: Chelsey Hunt, female    DOB: Jun 03, 1966, 48 y.o.   MRN: [id number]  Thyroid Problem Presents for follow-up visit. Patient reports no anxiety, cold intolerance, constipation, depressed mood, diaphoresis, diarrhea, dry skin, fatigue, hair loss, heat intolerance, hoarse voice, leg swelling, nail problem, palpitations, tremors, visual change, weight gain or weight loss. The symptoms have been stable. Past treatments include levothyroxine.      Review of Systems  Constitutional: Negative.  Negative for fever, chills, weight loss, weight gain, diaphoresis, appetite change, fatigue and unexpected weight change.  HENT: Negative.  Negative for hoarse voice.   Eyes: Negative.   Respiratory: Negative.  Negative for cough, choking, chest tightness, shortness of breath and stridor.   Cardiovascular: Negative.  Negative for chest pain, palpitations and leg swelling.  Gastrointestinal: Negative.  Negative for nausea, vomiting, abdominal pain, diarrhea and constipation.  Endocrine: Negative for cold intolerance and heat intolerance.  Genitourinary: Negative.   Musculoskeletal: Negative.   Skin: Negative.   Allergic/Immunologic: Negative.   Neurological: Negative.  Negative for dizziness, tremors, weakness, light-headedness and headaches.  Hematological: Negative.  Negative for adenopathy. Does not bruise/bleed easily.  Psychiatric/Behavioral: Negative.        Objective:   Physical Exam  Vitals reviewed. Constitutional: She appears well-developed and well-nourished. No distress.  HENT:  Head: Normocephalic and atraumatic.  Mouth/Throat: Oropharynx is clear and moist. No oropharyngeal exudate.  Eyes: Conjunctivae are normal. Right eye exhibits no discharge. Left eye exhibits no discharge. No scleral icterus.  Neck: Normal range of motion. Neck supple. No JVD present. No tracheal deviation present. No thyromegaly present.  Cardiovascular: Normal rate, regular  rhythm, normal heart sounds and intact distal pulses.  Exam reveals no gallop and no friction rub.   No murmur heard. Pulmonary/Chest: Effort normal and breath sounds normal. No stridor. No respiratory distress. She has no wheezes. She has no rales. She exhibits no tenderness.  Abdominal: Soft. Bowel sounds are normal. She exhibits no distension and no mass. There is no tenderness. There is no rebound and no guarding.  Musculoskeletal: Normal range of motion. She exhibits no edema and no tenderness.  Lymphadenopathy:    She has no cervical adenopathy.  Skin: Skin is warm and dry. No rash noted. She is not diaphoretic. No erythema. No pallor.  Psychiatric: She has a normal mood and affect. Her behavior is normal. Judgment and thought content normal.     Lab Results  Component Value Date   WBC 5.6 10/23/2013   HGB 14.0 10/23/2013   HCT 43.2 10/23/2013   PLT 411.0* 10/23/2013   GLUCOSE 88 11/11/2013   CHOL 202* 10/23/2013   TRIG 94.0 10/23/2013   HDL 52.70 10/23/2013   LDLDIRECT 133.5 10/23/2013   LDLCALC 122* 07/30/2012   ALT 34 11/11/2013   AST 47* 11/11/2013   NA 139 11/11/2013   K 4.3 11/11/2013   CL 104 11/11/2013   CREATININE 0.9 11/11/2013   BUN 12 11/11/2013   CO2 28 11/11/2013   TSH 0.57 10/23/2013       Assessment & Plan:

## 2014-02-17 NOTE — Patient Instructions (Signed)

## 2014-02-17 NOTE — Progress Notes (Signed)
Pre visit review using our clinic review tool, if applicable. No additional management support is needed unless otherwise documented below in the visit note. 

## 2014-02-18 ENCOUNTER — Encounter: Payer: Self-pay | Admitting: Internal Medicine

## 2014-02-18 NOTE — Assessment & Plan Note (Signed)
She sees Dr Chalmers Cater about this

## 2014-02-18 NOTE — Assessment & Plan Note (Signed)
Hep A vax started

## 2014-04-01 ENCOUNTER — Other Ambulatory Visit: Payer: Self-pay

## 2014-04-01 DIAGNOSIS — Z1231 Encounter for screening mammogram for malignant neoplasm of breast: Secondary | ICD-10-CM

## 2014-05-03 ENCOUNTER — Ambulatory Visit
Admission: RE | Admit: 2014-05-03 | Discharge: 2014-05-03 | Disposition: A | Discharge status: Home / Self Care | Payer: 59 | Source: Ambulatory Visit

## 2014-05-03 DIAGNOSIS — Z1231 Encounter for screening mammogram for malignant neoplasm of breast: Secondary | ICD-10-CM

## 2014-07-12 ENCOUNTER — Encounter: Payer: Self-pay | Admitting: Internal Medicine

## 2014-07-20 ENCOUNTER — Telehealth: Payer: Self-pay | Admitting: Internal Medicine

## 2014-07-20 NOTE — Telephone Encounter (Signed)
Received 6 pages from [place], sent to Dr. Ronnald Ramp. 07/20/14/ss

## 2014-08-25 ENCOUNTER — Other Ambulatory Visit (INDEPENDENT_AMBULATORY_CARE_PROVIDER_SITE_OTHER): Payer: 59

## 2014-08-25 ENCOUNTER — Encounter: Payer: Self-pay | Admitting: Internal Medicine

## 2014-08-25 ENCOUNTER — Ambulatory Visit (INDEPENDENT_AMBULATORY_CARE_PROVIDER_SITE_OTHER): Payer: 59 | Admitting: Internal Medicine

## 2014-08-25 VITALS — BP 102/78 | HR 66 | Temp 98.3°F | Resp 16 | Ht 60.0 in | Wt 131.0 lb

## 2014-08-25 DIAGNOSIS — R740 Nonspecific elevation of levels of transaminase and lactic acid dehydrogenase [LDH]: Secondary | ICD-10-CM

## 2014-08-25 DIAGNOSIS — D509 Iron deficiency anemia, unspecified: Secondary | ICD-10-CM | POA: Diagnosis not present

## 2014-08-25 DIAGNOSIS — R7402 Elevation of levels of lactic acid dehydrogenase (LDH): Secondary | ICD-10-CM

## 2014-08-25 DIAGNOSIS — E034 Atrophy of thyroid (acquired): Secondary | ICD-10-CM

## 2014-08-25 DIAGNOSIS — E038 Other specified hypothyroidism: Secondary | ICD-10-CM | POA: Diagnosis not present

## 2014-08-25 DIAGNOSIS — R7401 Elevation of levels of liver transaminase levels: Secondary | ICD-10-CM

## 2014-08-25 LAB — COMPREHENSIVE METABOLIC PANEL
ALK PHOS: 73 U/L (ref 39–117)
ALT: 19 U/L (ref 0–35)
AST: 20 U/L (ref 0–37)
Albumin: 4.2 g/dL (ref 3.5–5.2)
BUN: 10 mg/dL (ref 6–23)
CALCIUM: 9.8 mg/dL (ref 8.4–10.5)
CO2: 25 mEq/L (ref 19–32)
CREATININE: 1 mg/dL (ref 0.4–1.2)
Chloride: 104 mEq/L (ref 96–112)
GFR: 80.33 mL/min (ref >60.00)
Glucose, Bld: 95 mg/dL (ref 70–99)
Potassium: 4.1 mEq/L (ref 3.5–5.1)
Sodium: 138 mEq/L (ref 135–145)
Total Bilirubin: 0.4 mg/dL (ref 0.2–1.2)
Total Protein: 7.9 g/dL (ref 6.0–8.3)

## 2014-08-25 LAB — CBC WITH DIFFERENTIAL/PLATELET
BASOS PCT: 0.3 % (ref 0.0–3.0)
Basophils Absolute: 0 10*3/uL (ref 0.0–0.1)
EOS PCT: 4.7 % (ref 0.0–5.0)
Eosinophils Absolute: 0.3 10*3/uL (ref 0.0–0.7)
HCT: 41.9 % (ref 36.0–46.0)
HEMOGLOBIN: 13.9 g/dL (ref 12.0–15.0)
Lymphocytes Relative: 44.4 % (ref 12.0–46.0)
Lymphs Abs: 2.6 10*3/uL (ref 0.7–4.0)
MCHC: 33.2 g/dL (ref 30.0–36.0)
MCV: 92.1 fl (ref 78.0–100.0)
Monocytes Absolute: 0.5 10*3/uL (ref 0.1–1.0)
Monocytes Relative: 9.1 % (ref 3.0–12.0)
Neutro Abs: 2.4 10*3/uL (ref 1.4–7.7)
Neutrophils Relative %: 41.5 % — ABNORMAL LOW (ref 43.0–77.0)
Platelets: 387 10*3/uL (ref 150.0–400.0)
RBC: 4.55 Mil/uL (ref 3.87–5.11)
RDW: 13.3 % (ref 11.5–15.5)
WBC: 5.8 10*3/uL (ref 4.0–10.5)

## 2014-08-25 LAB — IBC PANEL
IRON: 80 ug/dL (ref 42–145)
Saturation Ratios: 19.6 % — ABNORMAL LOW (ref 20.0–50.0)
Transferrin: 292.1 mg/dL (ref 212.0–360.0)

## 2014-08-25 LAB — TSH: TSH: 0.87 u[IU]/mL (ref 0.35–4.50)

## 2014-08-25 LAB — FERRITIN: Ferritin: 18.4 ng/mL (ref 10.0–291.0)

## 2014-08-25 NOTE — Assessment & Plan Note (Signed)
I will recheck her CBC and her iron levels to see if this is causing her symptoms

## 2014-08-25 NOTE — Progress Notes (Signed)
   Subjective:    Patient ID: Chelsey Hunt, female    DOB: 02-Sep-1966, 48 y.o.   MRN: [id number]  Anemia Presents for follow-up visit. Symptoms include light-headedness and malaise/fatigue. There has been no abdominal pain, anorexia, bruising/bleeding easily, confusion, fever, leg swelling, palpitations, paresthesias, pica or weight loss. Past treatments include oral iron supplements.      Review of Systems  Constitutional: Positive for malaise/fatigue and fatigue. Negative for fever, chills, weight loss, diaphoresis and appetite change.  HENT: Negative.   Eyes: Negative.   Respiratory: Negative.  Negative for cough, choking, chest tightness, shortness of breath and stridor.   Cardiovascular: Negative.  Negative for chest pain, palpitations and leg swelling.  Gastrointestinal: Negative.  Negative for nausea, vomiting, abdominal pain, diarrhea, constipation, blood in stool and anorexia.  Endocrine: Negative.   Genitourinary: Negative.   Musculoskeletal: Negative.   Skin: Negative.   Allergic/Immunologic: Negative.   Neurological: Positive for dizziness and light-headedness. Negative for tremors, seizures, syncope, facial asymmetry, speech difficulty, weakness, numbness, headaches and paresthesias.       She saw her neurologist earlier today with c/o dizziness and she tells me that a work up is in progress  Hematological: Negative.  Negative for adenopathy. Does not bruise/bleed easily.  Psychiatric/Behavioral: Negative.  Negative for confusion and dysphoric mood.       Objective:   Physical Exam  Constitutional: She is oriented to person, place, and time. She appears well-developed and well-nourished. No distress.  HENT:  Head: Normocephalic and atraumatic.  Mouth/Throat: Oropharynx is clear and moist. No oropharyngeal exudate.  Eyes: Conjunctivae are normal. Right eye exhibits no discharge. Left eye exhibits no discharge. No scleral icterus.  Neck: Normal range of motion.  Neck supple. No JVD present. No tracheal deviation present. No thyromegaly present.  Cardiovascular: Normal rate, regular rhythm, normal heart sounds and intact distal pulses.  Exam reveals no gallop and no friction rub.   No murmur heard. Pulmonary/Chest: Effort normal and breath sounds normal. No stridor. No respiratory distress. She has no wheezes. She has no rales. She exhibits no tenderness.  Abdominal: Soft. Bowel sounds are normal. She exhibits no distension and no mass. There is no tenderness. There is no rebound and no guarding.  Musculoskeletal: Normal range of motion. She exhibits no edema or tenderness.  Lymphadenopathy:    She has no cervical adenopathy.  Neurological: She is oriented to person, place, and time.  Skin: Skin is warm and dry. No rash noted. She is not diaphoretic. No erythema. No pallor.  Vitals reviewed.    Lab Results  Component Value Date   WBC 5.6 10/23/2013   HGB 14.0 10/23/2013   HCT 43.2 10/23/2013   PLT 411.0* 10/23/2013   GLUCOSE 88 11/11/2013   CHOL 202* 10/23/2013   TRIG 94.0 10/23/2013   HDL 52.70 10/23/2013   LDLDIRECT 133.5 10/23/2013   LDLCALC 122* 07/30/2012   ALT 34 11/11/2013   AST 47* 11/11/2013   NA 139 11/11/2013   K 4.3 11/11/2013   CL 104 11/11/2013   CREATININE 0.9 11/11/2013   BUN 12 11/11/2013   CO2 28 11/11/2013   TSH 0.57 10/23/2013       Assessment & Plan:

## 2014-08-25 NOTE — Assessment & Plan Note (Signed)
I will check her TSH to see if thyroid disease is causing any of her s/s Will adjust her dose if needed

## 2014-08-26 ENCOUNTER — Encounter: Payer: Self-pay | Admitting: Internal Medicine

## 2014-11-24 ENCOUNTER — Encounter: Payer: Self-pay | Admitting: Internal Medicine

## 2014-11-24 ENCOUNTER — Other Ambulatory Visit (INDEPENDENT_AMBULATORY_CARE_PROVIDER_SITE_OTHER): Payer: 59

## 2014-11-24 ENCOUNTER — Ambulatory Visit (INDEPENDENT_AMBULATORY_CARE_PROVIDER_SITE_OTHER): Payer: 59 | Admitting: Internal Medicine

## 2014-11-24 VITALS — BP 122/78 | HR 80 | Temp 98.0°F | Resp 16 | Wt 135.0 lb

## 2014-11-24 DIAGNOSIS — E038 Other specified hypothyroidism: Secondary | ICD-10-CM

## 2014-11-24 DIAGNOSIS — D509 Iron deficiency anemia, unspecified: Secondary | ICD-10-CM

## 2014-11-24 LAB — TSH: TSH: 0.09 u[IU]/mL — ABNORMAL LOW (ref 0.35–4.50)

## 2014-11-24 NOTE — Progress Notes (Signed)
   Subjective:    Patient ID: Chelsey Hunt, female    DOB: 1966/07/26, 49 y.o.   MRN: [id number]  Thyroid Problem Presents for follow-up visit. Symptoms include constipation and weight gain. Patient reports no anxiety, cold intolerance, depressed mood, diaphoresis, diarrhea, dry skin, fatigue, hair loss, heat intolerance, hoarse voice, leg swelling, menstrual problem, nail problem, palpitations, tremors, visual change or weight loss. Past treatments include levothyroxine. The treatment provided significant relief.      Review of Systems  Constitutional: Positive for weight gain. Negative for fever, chills, weight loss, diaphoresis, appetite change and fatigue.  HENT: Negative.  Negative for hoarse voice.   Eyes: Negative.   Respiratory: Negative.  Negative for cough, choking, chest tightness, shortness of breath and stridor.   Cardiovascular: Negative.  Negative for chest pain, palpitations and leg swelling.  Gastrointestinal: Positive for constipation. Negative for vomiting, abdominal pain, diarrhea and blood in stool.  Endocrine: Negative.  Negative for cold intolerance and heat intolerance.  Genitourinary: Negative.  Negative for dysuria, enuresis, difficulty urinating and menstrual problem.  Musculoskeletal: Negative.   Skin: Negative.   Allergic/Immunologic: Negative.   Neurological: Negative.  Negative for dizziness, tremors, syncope, light-headedness and headaches.  Hematological: Negative.  Negative for adenopathy. Does not bruise/bleed easily.  Psychiatric/Behavioral: Negative.  The patient is not nervous/anxious.        Objective:   Physical Exam  Constitutional: She is oriented to person, place, and time. She appears well-developed and well-nourished. No distress.  HENT:  Head: Normocephalic and atraumatic.  Mouth/Throat: Oropharynx is clear and moist. No oropharyngeal exudate.  Eyes: Conjunctivae are normal. Right eye exhibits no discharge. Left eye exhibits no  discharge. No scleral icterus.  Neck: Normal range of motion. Neck supple. No JVD present. No tracheal deviation present. No thyromegaly present.  Cardiovascular: Normal rate, regular rhythm, normal heart sounds and intact distal pulses.  Exam reveals no gallop and no friction rub.   No murmur heard. Pulmonary/Chest: Effort normal and breath sounds normal. No stridor. No respiratory distress. She has no wheezes. She has no rales. She exhibits no tenderness.  Abdominal: Soft. Bowel sounds are normal. She exhibits no distension and no mass. There is no tenderness. There is no rebound and no guarding.  Musculoskeletal: Normal range of motion. She exhibits no edema or tenderness.  Lymphadenopathy:    She has no cervical adenopathy.  Neurological: She is oriented to person, place, and time.  Skin: Skin is warm and dry. No rash noted. She is not diaphoretic. No erythema. No pallor.  Vitals reviewed.    Lab Results  Component Value Date   WBC 5.8 08/25/2014   HGB 13.9 08/25/2014   HCT 41.9 08/25/2014   PLT 387.0 08/25/2014   GLUCOSE 95 08/25/2014   CHOL 202* 10/23/2013   TRIG 94.0 10/23/2013   HDL 52.70 10/23/2013   LDLDIRECT 133.5 10/23/2013   LDLCALC 122* 07/30/2012   ALT 19 08/25/2014   AST 20 08/25/2014   NA 138 08/25/2014   K 4.1 08/25/2014   CL 104 08/25/2014   CREATININE 1.0 08/25/2014   BUN 10 08/25/2014   CO2 25 08/25/2014   TSH 0.87 08/25/2014       Assessment & Plan:

## 2014-11-24 NOTE — Patient Instructions (Signed)
Hypothyroidism The thyroid is a large gland located in the lower front of your neck. The thyroid gland helps control metabolism. Metabolism is how your body handles food. It controls metabolism with the hormone thyroxine. When this gland is underactive (hypothyroid), it produces too little hormone.  CAUSES These include:   Absence or destruction of thyroid tissue.  Goiter due to iodine deficiency.  Goiter due to medications.  Congenital defects (since birth).  Problems with the pituitary. This causes a lack of TSH (thyroid stimulating hormone). This hormone tells the thyroid to turn out more hormone. SYMPTOMS  Lethargy (feeling as though you have no energy)  Cold intolerance  Weight gain (in spite of normal food intake)  Dry skin  Coarse hair  Menstrual irregularity (if severe, may lead to infertility)  Slowing of thought processes Cardiac problems are also caused by insufficient amounts of thyroid hormone. Hypothyroidism in the newborn is cretinism, and is an extreme form. It is important that this form be treated adequately and immediately or it will lead rapidly to retarded physical and mental development. DIAGNOSIS  To prove hypothyroidism, your caregiver may do blood tests and ultrasound tests. Sometimes the signs are hidden. It may be necessary for your caregiver to watch this illness with blood tests either before or after diagnosis and treatment. TREATMENT  Low levels of thyroid hormone are increased by using synthetic thyroid hormone. This is a safe, effective treatment. It usually takes about four weeks to gain the full effects of the medication. After you have the full effect of the medication, it will generally take another four weeks for problems to leave. Your caregiver may start you on low doses. If you have had heart problems the dose may be gradually increased. It is generally not an emergency to get rapidly to normal. HOME CARE INSTRUCTIONS   Take your  medications as your caregiver suggests. Let your caregiver know of any medications you are taking or start taking. Your caregiver will help you with dosage schedules.  As your condition improves, your dosage needs may increase. It will be necessary to have continuing blood tests as suggested by your caregiver.  Report all suspected medication side effects to your caregiver. SEEK MEDICAL CARE IF: Seek medical care if you develop:  Sweating.  Tremulousness (tremors).  Anxiety.  Rapid weight loss.  Heat intolerance.  Emotional swings.  Diarrhea.  Weakness. SEEK IMMEDIATE MEDICAL CARE IF:  You develop chest pain, an irregular heart beat (palpitations), or a rapid heart beat. MAKE SURE YOU:   Understand these instructions.  Will watch your condition.  Will get help right away if you are not doing well or get worse. Document Released: 08/27/2005 Document Revised: 11/19/2011 Document Reviewed: 04/16/2008 Exit[employer] Patient Information 2015 [employer]. This information is not intended to replace advice given to you by your health care provider. Make sure you discuss any questions you have with your health care provider.  

## 2014-11-24 NOTE — Progress Notes (Signed)
Pre visit review using our clinic review tool, if applicable. No additional management support is needed unless otherwise documented below in the visit note. 

## 2014-11-25 ENCOUNTER — Encounter: Payer: Self-pay | Admitting: Internal Medicine

## 2014-11-25 MED ORDER — LEVOTHYROXINE SODIUM 75 MCG PO TABS: 75.0000 ug | ORAL_TABLET | Freq: Every day | ORAL | Status: DC

## 2014-11-25 NOTE — Assessment & Plan Note (Signed)
Her TSh is suppressed so I have lowered her synthroid dose She will RTC in 2-3 months for a recheck

## 2014-12-21 ENCOUNTER — Encounter: Payer: Self-pay | Admitting: Internal Medicine

## 2014-12-21 ENCOUNTER — Ambulatory Visit (INDEPENDENT_AMBULATORY_CARE_PROVIDER_SITE_OTHER): Payer: 59 | Admitting: Internal Medicine

## 2014-12-21 ENCOUNTER — Other Ambulatory Visit (INDEPENDENT_AMBULATORY_CARE_PROVIDER_SITE_OTHER): Payer: 59

## 2014-12-21 VITALS — BP 106/64 | HR 86 | Temp 98.6°F | Resp 16 | Ht 60.0 in | Wt 128.0 lb

## 2014-12-21 DIAGNOSIS — R1011 Right upper quadrant pain: Secondary | ICD-10-CM | POA: Insufficient documentation

## 2014-12-21 DIAGNOSIS — R740 Nonspecific elevation of levels of transaminase and lactic acid dehydrogenase [LDH]: Secondary | ICD-10-CM

## 2014-12-21 DIAGNOSIS — E038 Other specified hypothyroidism: Secondary | ICD-10-CM | POA: Diagnosis not present

## 2014-12-21 DIAGNOSIS — R7402 Elevation of levels of lactic acid dehydrogenase (LDH): Secondary | ICD-10-CM

## 2014-12-21 DIAGNOSIS — R7401 Elevation of levels of liver transaminase levels: Secondary | ICD-10-CM

## 2014-12-21 LAB — CBC WITH DIFFERENTIAL/PLATELET
Basophils Absolute: 0 10*3/uL (ref 0.0–0.1)
Basophils Relative: 0.4 % (ref 0.0–3.0)
EOS PCT: 5.3 % — AB (ref 0.0–5.0)
Eosinophils Absolute: 0.2 10*3/uL (ref 0.0–0.7)
HEMATOCRIT: 42.8 % (ref 36.0–46.0)
Hemoglobin: 14.6 g/dL (ref 12.0–15.0)
LYMPHS ABS: 2.1 10*3/uL (ref 0.7–4.0)
Lymphocytes Relative: 46.3 % — ABNORMAL HIGH (ref 12.0–46.0)
MCHC: 34.1 g/dL (ref 30.0–36.0)
MCV: 89.6 fl (ref 78.0–100.0)
Monocytes Absolute: 0.3 10*3/uL (ref 0.1–1.0)
Monocytes Relative: 7.6 % (ref 3.0–12.0)
Neutro Abs: 1.9 10*3/uL (ref 1.4–7.7)
Neutrophils Relative %: 40.4 % — ABNORMAL LOW (ref 43.0–77.0)
PLATELETS: 359 10*3/uL (ref 150.0–400.0)
RBC: 4.77 Mil/uL (ref 3.87–5.11)
RDW: 12.8 % (ref 11.5–15.5)
WBC: 4.6 10*3/uL (ref 4.0–10.5)

## 2014-12-21 LAB — URINALYSIS, ROUTINE W REFLEX MICROSCOPIC
HGB URINE DIPSTICK: NEGATIVE
LEUKOCYTES UA: NEGATIVE
Nitrite: NEGATIVE
Specific Gravity, Urine: >=1.03 — AB (ref 1.000–1.030)
URINE GLUCOSE: NEGATIVE
UROBILINOGEN UA: 1 (ref 0.0–1.0)
pH: 5.5 (ref 5.0–8.0)

## 2014-12-21 LAB — COMPREHENSIVE METABOLIC PANEL
ALT: 17 U/L (ref 0–35)
AST: 19 U/L (ref 0–37)
Albumin: 4 g/dL (ref 3.5–5.2)
Alkaline Phosphatase: 82 U/L (ref 39–117)
BILIRUBIN TOTAL: 0.3 mg/dL (ref 0.2–1.2)
BUN: 9 mg/dL (ref 6–23)
CO2: 30 mEq/L (ref 19–32)
CREATININE: 0.95 mg/dL (ref 0.40–1.20)
Calcium: 9.9 mg/dL (ref 8.4–10.5)
Chloride: 105 mEq/L (ref 96–112)
GFR: 80.22 mL/min (ref >60.00)
Glucose, Bld: 86 mg/dL (ref 70–99)
POTASSIUM: 3.6 meq/L (ref 3.5–5.1)
SODIUM: 140 meq/L (ref 135–145)
Total Protein: 7.5 g/dL (ref 6.0–8.3)

## 2014-12-21 LAB — LIPASE: Lipase: 37 U/L (ref 11.0–59.0)

## 2014-12-21 LAB — AMYLASE: Amylase: 72 U/L (ref 27–131)

## 2014-12-21 NOTE — Assessment & Plan Note (Signed)
She does not appear to have acute abd process Will get an U/S done to check for gallstones Will get labs to screen for liver disease and pancreatitis Will check her UA or blood, protein, and infection She is taking meds now for symptom relief

## 2014-12-21 NOTE — Progress Notes (Signed)
Subjective:    Patient ID: Chelsey Hunt, female    DOB: 04/14/1966, 49 y.o.   MRN: [id number]  Abdominal Pain This is a recurrent problem. The current episode started in the past 7 days. The onset quality is gradual. The problem occurs intermittently. The problem has been unchanged. The pain is located in the RUQ. The pain is at a severity of 1/10. The pain is mild. The quality of the pain is aching. The abdominal pain does not radiate. Associated symptoms include belching, constipation, diarrhea and nausea. Pertinent negatives include no anorexia, arthralgias, dysuria, fever, flatus, frequency, headaches, hematochezia, hematuria, melena, myalgias, vomiting or weight loss. The pain is aggravated by eating. The pain is relieved by nothing. Her past medical history is significant for irritable bowel syndrome. There is no history of abdominal surgery, colon cancer, Crohn's disease, gallstones, GERD, pancreatitis, PUD or ulcerative colitis.      Review of Systems  Constitutional: Negative.  Negative for fever, chills, weight loss, diaphoresis, appetite change and fatigue.  HENT: Negative.  Negative for sinus pressure and trouble swallowing.   Eyes: Negative.   Respiratory: Negative.  Negative for cough, choking, chest tightness, shortness of breath and stridor.   Cardiovascular: Negative.  Negative for chest pain, palpitations and leg swelling.  Gastrointestinal: Positive for nausea, abdominal pain, diarrhea and constipation. Negative for vomiting, blood in stool, melena, hematochezia, abdominal distention, anal bleeding, rectal pain, anorexia and flatus.  Endocrine: Negative.   Genitourinary: Negative.  Negative for dysuria, urgency, frequency, hematuria, flank pain, decreased urine volume and difficulty urinating.  Musculoskeletal: Negative.  Negative for myalgias, back pain, joint swelling, arthralgias and neck pain.  Skin: Negative.  Negative for rash.  Allergic/Immunologic: Negative.     Neurological: Negative.  Negative for dizziness and headaches.  Hematological: Negative.  Negative for adenopathy. Does not bruise/bleed easily.  Psychiatric/Behavioral: Negative.        Objective:   Physical Exam  Constitutional: She is oriented to person, place, and time. She appears well-developed and well-nourished.  Non-toxic appearance. She does not have a sickly appearance. She does not appear ill. No distress.  HENT:  Head: Normocephalic and atraumatic.  Mouth/Throat: Oropharynx is clear and moist. No oropharyngeal exudate.  Eyes: Conjunctivae are normal. Right eye exhibits no discharge. Left eye exhibits no discharge. No scleral icterus.  Neck: Normal range of motion. Neck supple. No JVD present. No tracheal deviation present. No thyromegaly present.  Cardiovascular: Normal rate, regular rhythm and intact distal pulses.  Exam reveals no gallop and no friction rub.   No murmur heard. Pulmonary/Chest: Effort normal and breath sounds normal. No stridor. No respiratory distress. She has no wheezes. She has no rales. She exhibits no tenderness.  Abdominal: Soft. Normal appearance. She exhibits no shifting dullness, no distension, no pulsatile liver, no fluid wave, no abdominal bruit, no ascites, no pulsatile midline mass and no mass. Bowel sounds are decreased. There is no hepatosplenomegaly, splenomegaly or hepatomegaly. There is tenderness in the right upper quadrant. There is no rigidity, no rebound, no guarding, no CVA tenderness, no tenderness at McBurney's point and negative Murphy's sign. No hernia. Hernia confirmed negative in the ventral area, confirmed negative in the right inguinal area and confirmed negative in the left inguinal area.  Musculoskeletal: Normal range of motion. She exhibits no edema or tenderness.  Lymphadenopathy:    She has no cervical adenopathy.  Neurological: She is oriented to person, place, and time.  Skin: Skin is warm and dry. No rash noted. She is  not  diaphoretic. No erythema. No pallor.  Vitals reviewed.    Lab Results  Component Value Date   WBC 5.8 08/25/2014   HGB 13.9 08/25/2014   HCT 41.9 08/25/2014   PLT 387.0 08/25/2014   GLUCOSE 95 08/25/2014   CHOL 202* 10/23/2013   TRIG 94.0 10/23/2013   HDL 52.70 10/23/2013   LDLDIRECT 133.5 10/23/2013   LDLCALC 122* 07/30/2012   ALT 19 08/25/2014   AST 20 08/25/2014   NA 138 08/25/2014   K 4.1 08/25/2014   CL 104 08/25/2014   CREATININE 1.0 08/25/2014   BUN 10 08/25/2014   CO2 25 08/25/2014   TSH 0.09* 11/24/2014       Assessment & Plan:

## 2014-12-21 NOTE — Assessment & Plan Note (Signed)
Will recheck her LFT's today Will get an U/S done to screen for structural lesions

## 2014-12-21 NOTE — Patient Instructions (Signed)

## 2014-12-24 ENCOUNTER — Ambulatory Visit
Admission: RE | Admit: 2014-12-24 | Discharge: 2014-12-24 | Disposition: A | Discharge status: Home / Self Care | Payer: 59 | Source: Ambulatory Visit | Attending: Internal Medicine | Admitting: Internal Medicine

## 2014-12-24 DIAGNOSIS — R740 Nonspecific elevation of levels of transaminase and lactic acid dehydrogenase [LDH]: Principal | ICD-10-CM

## 2014-12-24 DIAGNOSIS — R7402 Elevation of levels of lactic acid dehydrogenase (LDH): Secondary | ICD-10-CM

## 2014-12-24 DIAGNOSIS — R7401 Elevation of levels of liver transaminase levels: Secondary | ICD-10-CM

## 2014-12-24 DIAGNOSIS — R1011 Right upper quadrant pain: Secondary | ICD-10-CM

## 2014-12-27 ENCOUNTER — Other Ambulatory Visit: Payer: Self-pay | Admitting: Internal Medicine

## 2014-12-27 ENCOUNTER — Encounter: Payer: Self-pay | Admitting: Internal Medicine

## 2014-12-27 DIAGNOSIS — K8689 Other specified diseases of pancreas: Secondary | ICD-10-CM

## 2014-12-27 DIAGNOSIS — Q453 Other congenital malformations of pancreas and pancreatic duct: Secondary | ICD-10-CM | POA: Insufficient documentation

## 2014-12-27 DIAGNOSIS — R740 Nonspecific elevation of levels of transaminase and lactic acid dehydrogenase [LDH]: Secondary | ICD-10-CM

## 2014-12-27 DIAGNOSIS — R7401 Elevation of levels of liver transaminase levels: Secondary | ICD-10-CM

## 2015-01-12 ENCOUNTER — Encounter: Payer: Self-pay | Admitting: Internal Medicine

## 2015-01-12 ENCOUNTER — Other Ambulatory Visit (INDEPENDENT_AMBULATORY_CARE_PROVIDER_SITE_OTHER): Payer: 59

## 2015-01-12 ENCOUNTER — Ambulatory Visit (INDEPENDENT_AMBULATORY_CARE_PROVIDER_SITE_OTHER): Payer: 59 | Admitting: Internal Medicine

## 2015-01-12 VITALS — BP 120/68 | HR 73 | Temp 98.5°F | Resp 16 | Ht 60.0 in | Wt 132.0 lb

## 2015-01-12 DIAGNOSIS — E039 Hypothyroidism, unspecified: Secondary | ICD-10-CM

## 2015-01-12 DIAGNOSIS — K589 Irritable bowel syndrome without diarrhea: Secondary | ICD-10-CM

## 2015-01-12 DIAGNOSIS — K2100 Gastro-esophageal reflux disease with esophagitis, without bleeding: Secondary | ICD-10-CM

## 2015-01-12 DIAGNOSIS — Q453 Other congenital malformations of pancreas and pancreatic duct: Secondary | ICD-10-CM

## 2015-01-12 DIAGNOSIS — R072 Precordial pain: Secondary | ICD-10-CM

## 2015-01-12 LAB — TSH: TSH: 0.37 u[IU]/mL (ref 0.35–4.50)

## 2015-01-12 NOTE — Patient Instructions (Signed)
Hypothyroidism The thyroid is a large gland located in the lower front of your neck. The thyroid gland helps control metabolism. Metabolism is how your body handles food. It controls metabolism with the hormone thyroxine. When this gland is underactive (hypothyroid), it produces too little hormone.  CAUSES These include:   Absence or destruction of thyroid tissue.  Goiter due to iodine deficiency.  Goiter due to medications.  Congenital defects (since birth).  Problems with the pituitary. This causes a lack of TSH (thyroid stimulating hormone). This hormone tells the thyroid to turn out more hormone. SYMPTOMS  Lethargy (feeling as though you have no energy)  Cold intolerance  Weight gain (in spite of normal food intake)  Dry skin  Coarse hair  Menstrual irregularity (if severe, may lead to infertility)  Slowing of thought processes Cardiac problems are also caused by insufficient amounts of thyroid hormone. Hypothyroidism in the newborn is cretinism, and is an extreme form. It is important that this form be treated adequately and immediately or it will lead rapidly to retarded physical and mental development. DIAGNOSIS  To prove hypothyroidism, your caregiver may do blood tests and ultrasound tests. Sometimes the signs are hidden. It may be necessary for your caregiver to watch this illness with blood tests either before or after diagnosis and treatment. TREATMENT  Low levels of thyroid hormone are increased by using synthetic thyroid hormone. This is a safe, effective treatment. It usually takes about four weeks to gain the full effects of the medication. After you have the full effect of the medication, it will generally take another four weeks for problems to leave. Your caregiver may start you on low doses. If you have had heart problems the dose may be gradually increased. It is generally not an emergency to get rapidly to normal. HOME CARE INSTRUCTIONS   Take your  medications as your caregiver suggests. Let your caregiver know of any medications you are taking or start taking. Your caregiver will help you with dosage schedules.  As your condition improves, your dosage needs may increase. It will be necessary to have continuing blood tests as suggested by your caregiver.  Report all suspected medication side effects to your caregiver. SEEK MEDICAL CARE IF: Seek medical care if you develop:  Sweating.  Tremulousness (tremors).  Anxiety.  Rapid weight loss.  Heat intolerance.  Emotional swings.  Diarrhea.  Weakness. SEEK IMMEDIATE MEDICAL CARE IF:  You develop chest pain, an irregular heart beat (palpitations), or a rapid heart beat. MAKE SURE YOU:   Understand these instructions.  Will watch your condition.  Will get help right away if you are not doing well or get worse. Document Released: 08/27/2005 Document Revised: 11/19/2011 Document Reviewed: 04/16/2008 Exit[employer] Patient Information 2015 [employer]. This information is not intended to replace advice given to you by your health care provider. Make sure you discuss any questions you have with your health care provider.  

## 2015-01-12 NOTE — Assessment & Plan Note (Signed)
MRI is schedules to f/up on this She does not have any s/s of this today

## 2015-01-12 NOTE — Progress Notes (Signed)
Pre visit review using our clinic review tool, if applicable. No additional management support is needed unless otherwise documented below in the visit note. 

## 2015-01-12 NOTE — Assessment & Plan Note (Signed)
Her TSh is in the normal range Will cont the current synthroid dose 

## 2015-01-12 NOTE — Progress Notes (Signed)
   Subjective:    Patient ID: Chelsey Hunt, female    DOB: [birth year], 49 y.o.   MRN: [id number]  Thyroid Problem Presents for follow-up visit. Patient reports no anxiety, cold intolerance, constipation, depressed mood, diaphoresis, diarrhea, dry skin, fatigue, hair loss, heat intolerance, hoarse voice, leg swelling, nail problem, palpitations, tremors, visual change, weight gain or weight loss. The symptoms have been improving. Past treatments include levothyroxine. The treatment provided moderate relief.      Review of Systems  Constitutional: Negative for fever, chills, weight loss, weight gain, diaphoresis, appetite change and fatigue.  HENT: Negative.  Negative for hoarse voice and trouble swallowing.   Eyes: Negative.   Respiratory: Negative.   Cardiovascular: Negative.  Negative for chest pain, palpitations and leg swelling.  Gastrointestinal: Negative.  Negative for nausea, vomiting, abdominal pain, diarrhea and constipation.  Endocrine: Negative.  Negative for cold intolerance and heat intolerance.  Genitourinary: Negative.   Musculoskeletal: Negative.   Skin: Negative.   Allergic/Immunologic: Negative.   Neurological: Negative.  Negative for dizziness and tremors.  Hematological: Negative.  Negative for adenopathy. Does not bruise/bleed easily.  Psychiatric/Behavioral: Negative.  The patient is not nervous/anxious.        Objective:   Physical Exam  Constitutional: She is oriented to person, place, and time. She appears well-developed and well-nourished.  Non-toxic appearance. She does not have a sickly appearance. She does not appear ill. No distress.  HENT:  Head: Normocephalic and atraumatic.  Mouth/Throat: Oropharynx is clear and moist. No oropharyngeal exudate.  Eyes: Conjunctivae are normal. Right eye exhibits no discharge. Left eye exhibits no discharge. No scleral icterus.  Neck: Normal range of motion. Neck supple. No JVD present. No tracheal deviation  present. No thyromegaly present.  Cardiovascular: Normal rate, regular rhythm, normal heart sounds and intact distal pulses.  Exam reveals no gallop and no friction rub.   No murmur heard. Pulmonary/Chest: Effort normal and breath sounds normal. No stridor. No respiratory distress. She has no wheezes. She has no rales. She exhibits no tenderness.  Abdominal: Soft. Bowel sounds are normal. She exhibits no distension and no mass. There is no tenderness. There is no rebound and no guarding.  Musculoskeletal: Normal range of motion. She exhibits no edema or tenderness.  Lymphadenopathy:    She has no cervical adenopathy.  Neurological: She is oriented to person, place, and time.  Skin: Skin is warm and dry. No rash noted. She is not diaphoretic. No erythema. No pallor.  Vitals reviewed.    Lab Results  Component Value Date   WBC 4.6 12/21/2014   HGB 14.6 12/21/2014   HCT 42.8 12/21/2014   PLT 359.0 12/21/2014   GLUCOSE 86 12/21/2014   CHOL 202* 10/23/2013   TRIG 94.0 10/23/2013   HDL 52.70 10/23/2013   LDLDIRECT 133.5 10/23/2013   LDLCALC 122* 07/30/2012   ALT 17 12/21/2014   AST 19 12/21/2014   NA 140 12/21/2014   K 3.6 12/21/2014   CL 105 12/21/2014   CREATININE 0.95 12/21/2014   BUN 9 12/21/2014   CO2 30 12/21/2014   TSH 0.09* 11/24/2014       Assessment & Plan:

## 2015-01-13 ENCOUNTER — Ambulatory Visit: Payer: 59 | Admitting: Internal Medicine

## 2015-01-18 ENCOUNTER — Ambulatory Visit
Admission: RE | Admit: 2015-01-18 | Discharge: 2015-01-18 | Disposition: A | Discharge status: Home / Self Care | Payer: 59 | Source: Ambulatory Visit | Attending: Internal Medicine | Admitting: Internal Medicine

## 2015-01-18 DIAGNOSIS — R740 Nonspecific elevation of levels of transaminase and lactic acid dehydrogenase [LDH]: Secondary | ICD-10-CM

## 2015-01-18 DIAGNOSIS — R7401 Elevation of levels of liver transaminase levels: Secondary | ICD-10-CM

## 2015-01-18 DIAGNOSIS — K8689 Other specified diseases of pancreas: Secondary | ICD-10-CM

## 2015-01-18 DIAGNOSIS — R7402 Elevation of levels of lactic acid dehydrogenase (LDH): Secondary | ICD-10-CM

## 2015-01-18 MED ORDER — GADOBENATE DIMEGLUMINE 529 MG/ML IV SOLN
10.0000 mL | Freq: Once | INTRAVENOUS | Status: AC | PRN
  Administered 2015-01-18: 10 mL via INTRAVENOUS

## 2015-01-19 ENCOUNTER — Telehealth: Payer: Self-pay | Admitting: Internal Medicine

## 2015-01-19 NOTE — Telephone Encounter (Signed)
Patient is requesting for a copy of MRI results to be sent to her GI doctor. Dr. Mitchell Heir @ [facility].

## 2015-01-23 ENCOUNTER — Encounter: Payer: Self-pay | Admitting: Internal Medicine

## 2015-01-31 ENCOUNTER — Telehealth: Payer: Self-pay | Admitting: Internal Medicine

## 2015-01-31 NOTE — Telephone Encounter (Signed)
Please call patient regarding her thyroid check

## 2015-01-31 NOTE — Telephone Encounter (Signed)
MD release to mychart called pt no answer LMOM results normal.../lmb

## 2015-02-17 ENCOUNTER — Encounter: Payer: Self-pay | Admitting: Gastroenterology

## 2015-04-20 ENCOUNTER — Ambulatory Visit (INDEPENDENT_AMBULATORY_CARE_PROVIDER_SITE_OTHER): Payer: 59 | Admitting: Podiatry

## 2015-04-20 ENCOUNTER — Ambulatory Visit (INDEPENDENT_AMBULATORY_CARE_PROVIDER_SITE_OTHER): Payer: 59

## 2015-04-20 ENCOUNTER — Encounter: Payer: Self-pay | Admitting: Podiatry

## 2015-04-20 VITALS — BP 111/67 | HR 77 | Resp 16 | Ht 60.0 in | Wt 131.0 lb

## 2015-04-20 DIAGNOSIS — M722 Plantar fascial fibromatosis: Secondary | ICD-10-CM | POA: Diagnosis not present

## 2015-04-20 DIAGNOSIS — M79672 Pain in left foot: Secondary | ICD-10-CM

## 2015-04-20 DIAGNOSIS — M79671 Pain in right foot: Secondary | ICD-10-CM

## 2015-04-20 MED ORDER — DICLOFENAC SODIUM 75 MG PO TBEC: 75.0000 mg | DELAYED_RELEASE_TABLET | Freq: Two times a day (BID) | ORAL | Status: DC

## 2015-04-20 MED ORDER — TRIAMCINOLONE ACETONIDE 10 MG/ML IJ SUSP
10.0000 mg | Freq: Once | INTRAMUSCULAR | Status: AC
  Administered 2015-04-20: 10 mg

## 2015-04-20 NOTE — Progress Notes (Signed)
   Subjective:    Patient ID: Chelsey Hunt, female    DOB: 01/26/1966, 49 y.o.   MRN: [id number]  HPI Patient presents with bilateral foot pain, heel and back of heel. Pt stated, "have shooting pain and burning sensation". This has been going on for past month. Pt has used epsom salt and green alcohol with some relief.  Patient also presents with a possible callous on their left foot, heel. Pt stated, "painful to stand on foot". This has been going on for past couple of weeks.     Review of Systems  HENT: Positive for sinus pressure and sneezing.   Eyes: Positive for pain.  Gastrointestinal: Positive for nausea.  Endocrine: Positive for cold intolerance and heat intolerance.  Musculoskeletal: Positive for arthralgias.  Neurological: Positive for dizziness, numbness and headaches.  All other systems reviewed and are negative.      Objective:   Physical Exam        Assessment & Plan:

## 2015-04-20 NOTE — Patient Instructions (Signed)
Plantar Fasciitis (Heel Spur Syndrome) with Rehab The plantar fascia is a fibrous, ligament-like, soft-tissue structure that spans the bottom of the foot. Plantar fasciitis is a condition that causes pain in the foot due to inflammation of the tissue. SYMPTOMS   Pain and tenderness on the underneath side of the foot.  Pain that worsens with standing or walking. CAUSES  Plantar fasciitis is caused by irritation and injury to the plantar fascia on the underneath side of the foot. Common mechanisms of injury include:  Direct trauma to bottom of the foot.  Damage to a small nerve that runs under the foot where the main fascia attaches to the heel bone.  Stress placed on the plantar fascia due to bone spurs. RISK INCREASES WITH:   Activities that place stress on the plantar fascia (running, jumping, pivoting, or cutting).  Poor strength and flexibility.  Improperly fitted shoes.  Tight calf muscles.  Flat feet.  Failure to warm-up properly before activity.  Obesity. PREVENTION  Warm up and stretch properly before activity.  Allow for adequate recovery between workouts.  Maintain physical fitness:  Strength, flexibility, and endurance.  Cardiovascular fitness.  Maintain a health body weight.  Avoid stress on the plantar fascia.  Wear properly fitted shoes, including arch supports for individuals who have flat feet.  PROGNOSIS  If treated properly, then the symptoms of plantar fasciitis usually resolve without surgery. However, occasionally surgery is necessary.  RELATED COMPLICATIONS   Recurrent symptoms that may result in a chronic condition.  Problems of the lower back that are caused by compensating for the injury, such as limping.  Pain or weakness of the foot during push-off following surgery.  Chronic inflammation, scarring, and partial or complete fascia tear, occurring more often from repeated injections.  TREATMENT  Treatment initially involves the  use of ice and medication to help reduce pain and inflammation. The use of strengthening and stretching exercises may help reduce pain with activity, especially stretches of the Achilles tendon. These exercises may be performed at home or with a therapist. Your caregiver may recommend that you use heel cups of arch supports to help reduce stress on the plantar fascia. Occasionally, corticosteroid injections are given to reduce inflammation. If symptoms persist for greater than 6 months despite non-surgical (conservative), then surgery may be recommended.   MEDICATION   If pain medication is necessary, then nonsteroidal anti-inflammatory medications, such as aspirin and ibuprofen, or other minor pain relievers, such as acetaminophen, are often recommended.  Do not take pain medication within 7 days before surgery.  Prescription pain relievers may be given if deemed necessary by your caregiver. Use only as directed and only as much as you need.  Corticosteroid injections may be given by your caregiver. These injections should be reserved for the most serious cases, because they may only be given a certain number of times.  HEAT AND COLD  Cold treatment (icing) relieves pain and reduces inflammation. Cold treatment should be applied for 10 to 15 minutes every 2 to 3 hours for inflammation and pain and immediately after any activity that aggravates your symptoms. Use ice packs or massage the area with a piece of ice (ice massage).  Heat treatment may be used prior to performing the stretching and strengthening activities prescribed by your caregiver, physical therapist, or athletic trainer. Use a heat pack or soak the injury in warm water.  SEEK IMMEDIATE MEDICAL CARE IF:  Treatment seems to offer no benefit, or the condition worsens.  Any medications   produce adverse side effects.  EXERCISES- RANGE OF MOTION (ROM) AND STRETCHING EXERCISES - Plantar Fasciitis (Heel Spur Syndrome) These exercises  may help you when beginning to rehabilitate your injury. Your symptoms may resolve with or without further involvement from your physician, physical therapist or athletic trainer. While completing these exercises, remember:   Restoring tissue flexibility helps normal motion to return to the joints. This allows healthier, less painful movement and activity.  An effective stretch should be held for at least 30 seconds.  A stretch should never be painful. You should only feel a gentle lengthening or release in the stretched tissue.  RANGE OF MOTION - Toe Extension, Flexion  Sit with your right / left leg crossed over your opposite knee.  Grasp your toes and gently pull them back toward the top of your foot. You should feel a stretch on the bottom of your toes and/or foot.  Hold this stretch for 10 seconds.  Now, gently pull your toes toward the bottom of your foot. You should feel a stretch on the top of your toes and or foot.  Hold this stretch for 10 seconds. Repeat  times. Complete this stretch 3 times per day.   RANGE OF MOTION - Ankle Dorsiflexion, Active Assisted  Remove shoes and sit on a chair that is preferably not on a carpeted surface.  Place right / left foot under knee. Extend your opposite leg for support.  Keeping your heel down, slide your right / left foot back toward the chair until you feel a stretch at your ankle or calf. If you do not feel a stretch, slide your bottom forward to the edge of the chair, while still keeping your heel down.  Hold this stretch for 10 seconds. Repeat 3 times. Complete this stretch 2 times per day.   STRETCH  Gastroc, Standing  Place hands on wall.  Extend right / left leg, keeping the front knee somewhat bent.  Slightly point your toes inward on your back foot.  Keeping your right / left heel on the floor and your knee straight, shift your weight toward the wall, not allowing your back to arch.  You should feel a gentle stretch  in the right / left calf. Hold this position for 10 seconds. Repeat 3 times. Complete this stretch 2 times per day.  STRETCH  Soleus, Standing  Place hands on wall.  Extend right / left leg, keeping the other knee somewhat bent.  Slightly point your toes inward on your back foot.  Keep your right / left heel on the floor, bend your back knee, and slightly shift your weight over the back leg so that you feel a gentle stretch deep in your back calf.  Hold this position for 10 seconds. Repeat 3 times. Complete this stretch 2 times per day.  STRETCH  Gastrocsoleus, Standing  Note: This exercise can place a lot of stress on your foot and ankle. Please complete this exercise only if specifically instructed by your caregiver.   Place the ball of your right / left foot on a step, keeping your other foot firmly on the same step.  Hold on to the wall or a rail for balance.  Slowly lift your other foot, allowing your body weight to press your heel down over the edge of the step.  You should feel a stretch in your right / left calf.  Hold this position for 10 seconds.  Repeat this exercise with a slight bend in your right /   left knee. Repeat 3 times. Complete this stretch 2 times per day.   STRENGTHENING EXERCISES - Plantar Fasciitis (Heel Spur Syndrome)  These exercises may help you when beginning to rehabilitate your injury. They may resolve your symptoms with or without further involvement from your physician, physical therapist or athletic trainer. While completing these exercises, remember:   Muscles can gain both the endurance and the strength needed for everyday activities through controlled exercises.  Complete these exercises as instructed by your physician, physical therapist or athletic trainer. Progress the resistance and repetitions only as guided.  STRENGTH - Towel Curls  Sit in a chair positioned on a non-carpeted surface.  Place your foot on a towel, keeping your heel  on the floor.  Pull the towel toward your heel by only curling your toes. Keep your heel on the floor. Repeat 3 times. Complete this exercise 2 times per day.  STRENGTH - Ankle Inversion  Secure one end of a rubber exercise band/tubing to a fixed object (table, pole). Loop the other end around your foot just before your toes.  Place your fists between your knees. This will focus your strengthening at your ankle.  Slowly, pull your big toe up and in, making sure the band/tubing is positioned to resist the entire motion.  Hold this position for 10 seconds.  Have your muscles resist the band/tubing as it slowly pulls your foot back to the starting position. Repeat 3 times. Complete this exercises 2 times per day.  Document Released: 08/27/2005 Document Revised: 11/19/2011 Document Reviewed: 12/09/2008 [employer] Patient Information 2014 [employer]. Achilles Tendinitis  with Rehab Achilles tendinitis is a disorder of the Achilles tendon. The Achilles tendon connects the large calf muscles (Gastrocnemius and Soleus) to the heel bone (calcaneus). This tendon is sometimes called the heel cord. It is important for pushing-off and standing on your toes and is important for walking, running, or jumping. Tendinitis is often caused by overuse and repetitive microtrauma. SYMPTOMS  Pain, tenderness, swelling, warmth, and redness may occur over the Achilles tendon even at rest.  Pain with pushing off, or flexing or extending the ankle.  Pain that is worsened after or during activity. CAUSES   Overuse sometimes seen with rapid increase in exercise programs or in sports requiring running and jumping.  Poor physical conditioning (strength and flexibility or endurance).  Running sports, especially training running down hills.  Inadequate warm-up before practice or play or failure to stretch before participation.  Injury to the tendon. PREVENTION   Warm up and stretch before practice or  competition.  Allow time for adequate rest and recovery between practices and competition.  Keep up conditioning.  Keep up ankle and leg flexibility.  Improve or keep muscle strength and endurance.  Improve cardiovascular fitness.  Use proper technique.  Use proper equipment (shoes, skates).  To help prevent recurrence, taping, protective strapping, or an adhesive bandage may be recommended for several weeks after healing is complete. PROGNOSIS   Recovery may take weeks to several months to heal.  Longer recovery is expected if symptoms have been prolonged.  Recovery is usually quicker if the inflammation is due to a direct blow as compared with overuse or sudden strain. RELATED COMPLICATIONS   Healing time will be prolonged if the condition is not correctly treated. The injury must be given plenty of time to heal.  Symptoms can reoccur if activity is resumed too soon.  Untreated, tendinitis may increase the risk of tendon rupture requiring additional time for recovery   and possibly surgery. TREATMENT   The first treatment consists of rest anti-inflammatory medication, and ice to relieve the pain.  Stretching and strengthening exercises after resolution of pain will likely help reduce the risk of recurrence. Referral to a physical therapist or athletic trainer for further evaluation and treatment may be helpful.  A walking boot or cast may be recommended to rest the Achilles tendon. This can help break the cycle of inflammation and microtrauma.  Arch supports (orthotics) may be prescribed or recommended by your caregiver as an adjunct to therapy and rest.  Surgery to remove the inflamed tendon lining or degenerated tendon tissue is rarely necessary and has shown less than predictable results. MEDICATION   Nonsteroidal anti-inflammatory medications, such as aspirin and ibuprofen, may be used for pain and inflammation relief. Do not take within 7 days before surgery. Take  these as directed by your caregiver. Contact your caregiver immediately if any bleeding, stomach upset, or signs of allergic reaction occur. Other minor pain relievers, such as acetaminophen, may also be used.  Pain relievers may be prescribed as necessary by your caregiver. Do not take prescription pain medication for longer than 4 to 7 days. Use only as directed and only as much as you need.  Cortisone injections are rarely indicated. Cortisone injections may weaken tendons and predispose to rupture. It is better to give the condition more time to heal than to use them. HEAT AND COLD  Cold is used to relieve pain and reduce inflammation for acute and chronic Achilles tendinitis. Cold should be applied for 10 to 15 minutes every 2 to 3 hours for inflammation and pain and immediately after any activity that aggravates your symptoms. Use ice packs or an ice massage.  Heat may be used before performing stretching and strengthening activities prescribed by your caregiver. Use a heat pack or a warm soak. SEEK MEDICAL CARE IF:  Symptoms get worse or do not improve in 2 weeks despite treatment.  New, unexplained symptoms develop. Drugs used in treatment may produce side effects.  EXERCISES:  RANGE OF MOTION (ROM) AND STRETCHING EXERCISES - Achilles Tendinitis  These exercises may help you when beginning to rehabilitate your injury. Your symptoms may resolve with or without further involvement from your physician, physical therapist or athletic trainer. While completing these exercises, remember:   Restoring tissue flexibility helps normal motion to return to the joints. This allows healthier, less painful movement and activity.  An effective stretch should be held for at least 30 seconds.  A stretch should never be painful. You should only feel a gentle lengthening or release in the stretched tissue.  STRETCH  Gastroc, Standing   Place hands on wall.  Extend right / left leg, keeping the  front knee somewhat bent.  Slightly point your toes inward on your back foot.  Keeping your right / left heel on the floor and your knee straight, shift your weight toward the wall, not allowing your back to arch.  You should feel a gentle stretch in the right / left calf. Hold this position for 10 seconds. Repeat 3 times. Complete this stretch 2 times per day.  STRETCH  Soleus, Standing   Place hands on wall.  Extend right / left leg, keeping the other knee somewhat bent.  Slightly point your toes inward on your back foot.  Keep your right / left heel on the floor, bend your back knee, and slightly shift your weight over the back leg so that you feel a   gentle stretch deep in your back calf.  Hold this position for 10 seconds. Repeat 3 times. Complete this stretch 2 times per day.  STRETCH  Gastrocsoleus, Standing  Note: This exercise can place a lot of stress on your foot and ankle. Please complete this exercise only if specifically instructed by your caregiver.   Place the ball of your right / left foot on a step, keeping your other foot firmly on the same step.  Hold on to the wall or a rail for balance.  Slowly lift your other foot, allowing your body weight to press your heel down over the edge of the step.  You should feel a stretch in your right / left calf.  Hold this position for 10 seconds.  Repeat this exercise with a slight bend in your knee. Repeat 3 times. Complete this stretch 2 times per day.   STRENGTHENING EXERCISES - Achilles Tendinitis These exercises may help you when beginning to rehabilitate your injury. They may resolve your symptoms with or without further involvement from your physician, physical therapist or athletic trainer. While completing these exercises, remember:   Muscles can gain both the endurance and the strength needed for everyday activities through controlled exercises.  Complete these exercises as instructed by your physician,  physical therapist or athletic trainer. Progress the resistance and repetitions only as guided.  You may experience muscle soreness or fatigue, but the pain or discomfort you are trying to eliminate should never worsen during these exercises. If this pain does worsen, stop and make certain you are following the directions exactly. If the pain is still present after adjustments, discontinue the exercise until you can discuss the trouble with your clinician.  STRENGTH - Plantar-flexors   Sit with your right / left leg extended. Holding onto both ends of a rubber exercise band/tubing, loop it around the ball of your foot. Keep a slight tension in the band.  Slowly push your toes away from you, pointing them downward.  Hold this position for 10 seconds. Return slowly, controlling the tension in the band/tubing. Repeat 3 times. Complete this exercise 2 times per day.   STRENGTH - Plantar-flexors   Stand with your feet shoulder width apart. Steady yourself with a wall or table using as little support as needed.  Keeping your weight evenly spread over the width of your feet, rise up on your toes.*  Hold this position for 10 seconds. Repeat 3 times. Complete this exercise 2 times per day.  *If this is too easy, shift your weight toward your right / left leg until you feel challenged. Ultimately, you may be asked to do this exercise with your right / left foot only.  STRENGTH  Plantar-flexors, Eccentric  Note: This exercise can place a lot of stress on your foot and ankle. Please complete this exercise only if specifically instructed by your caregiver.   Place the balls of your feet on a step. With your hands, use only enough support from a wall or rail to keep your balance.  Keep your knees straight and rise up on your toes.  Slowly shift your weight entirely to your right / left toes and pick up your opposite foot. Gently and with controlled movement, lower your weight through your right /  left foot so that your heel drops below the level of the step. You will feel a slight stretch in the back of your calf at the end position.  Use the healthy leg to help rise up onto   the balls of both feet, then lower weight only on the right / left leg again. Build up to 15 repetitions. Then progress to 3 consecutive sets of 15 repetitions.*  After completing the above exercise, complete the same exercise with a slight knee bend (about 30 degrees). Again, build up to 15 repetitions. Then progress to 3 consecutive sets of 15 repetitions.* Perform this exercise 2 times per day.  *When you easily complete 3 sets of 15, your physician, physical therapist or athletic trainer may advise you to add resistance by wearing a backpack filled with additional weight.  STRENGTH - Plantar Flexors, Seated   Sit on a chair that allows your feet to rest flat on the ground. If necessary, sit at the edge of the chair.  Keeping your toes firmly on the ground, lift your right / left heel as far as you can without increasing any discomfort in your ankle. Repeat 3 times. Complete this exercise 2 times a day.  

## 2015-04-20 NOTE — Progress Notes (Signed)
Subjective:     Patient ID: Chelsey Hunt, female   DOB: Dec 24, 1965, 49 y.o.   MRN: [id number]  HPI patient presents stating I'm getting a lot of pain in the plantar aspect of my arch left over right and it seemed to occur after I got new shoes. States that she's tried Epson salt soaks and she's tried different shoes with minimal relief of symptoms and it is quite tender on the left over the right foot   Review of Systems  All other systems reviewed and are negative.      Objective:   Physical Exam  Constitutional: She is oriented to person, place, and time.  Musculoskeletal: Normal range of motion.  Neurological: She is oriented to person, place, and time.  Skin: Skin is warm.  Nursing note and vitals reviewed.  neurovascular status intact muscle strength adequate with range of motion within normal limits and patient's noted to have moderate flatfoot deformity and exquisite discomfort in the left mid arch area over right with inflammation and fluid when palpated. Good digital perfusion is noted and patient is well oriented 3     Assessment:     Mid arch fasciitis left over right    Plan:     H&P and conditions reviewed with patient. Along with x-rays at this time I have recommended conservative care and I did a mid arch injection left 3 Milligan Kenalog 5 mill grams Xylocaine and applied fascial brace placed on Voltaren 75 mg twice a day and reappoint to recheck in one week along with instructions on physical therapy which were dispensed to patient

## 2015-04-28 ENCOUNTER — Encounter: Payer: Self-pay | Admitting: Podiatry

## 2015-04-28 ENCOUNTER — Ambulatory Visit (INDEPENDENT_AMBULATORY_CARE_PROVIDER_SITE_OTHER): Payer: 59 | Admitting: Podiatry

## 2015-04-28 VITALS — BP 115/66 | HR 70 | Resp 12

## 2015-04-28 DIAGNOSIS — M722 Plantar fascial fibromatosis: Secondary | ICD-10-CM

## 2015-05-01 NOTE — Progress Notes (Signed)
Subjective:     Patient ID: Chelsey Hunt, female   DOB: 04/15/1966, 49 y.o.   MRN: [id number]  HPI patient states that she still has pain if she does a lot of walking or when she gets up in the morning but it has improved from previously   Review of Systems     Objective:   Physical Exam Neurovascular status intact muscle strength was adequate and range of motion within normal limits. Patient's noted to have discomfort in the plantar aspect right heel that's improved but still present especially upon deep palpation and patient is still walking with a relative a propulsive gait pattern    Assessment:     Plantar fasciitis right still present with inflammation noted    Plan:     Advised on the importance of physical therapy supportive shoes and dispensed night splint with instructions on usage. Patient will be seen back for is to recheck may require orthotics or shockwave therapy or possible surgery if symptoms continue

## 2015-07-20 LAB — HM PAP SMEAR

## 2015-07-20 LAB — HM MAMMOGRAPHY

## 2015-09-13 DIAGNOSIS — R32 Unspecified urinary incontinence: Secondary | ICD-10-CM | POA: Diagnosis not present

## 2015-09-13 MED FILL — ESTRADIOL 1 MG TABLET: 1 | 30 days supply | Qty: 30 | Fill #1

## 2015-09-13 MED FILL — ESTRADIOL 0.5 MG TABLET: 0.5 | 30 days supply | Qty: 30 | Fill #1

## 2015-09-19 DIAGNOSIS — Z1211 Encounter for screening for malignant neoplasm of colon: Secondary | ICD-10-CM | POA: Diagnosis not present

## 2015-09-19 LAB — HM COLONOSCOPY

## 2015-09-21 DIAGNOSIS — R32 Unspecified urinary incontinence: Secondary | ICD-10-CM | POA: Diagnosis not present

## 2015-09-28 DIAGNOSIS — R32 Unspecified urinary incontinence: Secondary | ICD-10-CM | POA: Diagnosis not present

## 2015-10-07 DIAGNOSIS — R32 Unspecified urinary incontinence: Secondary | ICD-10-CM | POA: Diagnosis not present

## 2015-10-17 MED FILL — ESTRADIOL 0.5 MG TABLET: 0.5 | 30 days supply | Qty: 30 | Fill #2

## 2015-10-17 MED FILL — ESTRADIOL 1 MG TABLET: 1 | 30 days supply | Qty: 30 | Fill #2

## 2015-10-20 DIAGNOSIS — R32 Unspecified urinary incontinence: Secondary | ICD-10-CM | POA: Diagnosis not present

## 2015-10-20 MED FILL — LEVOTHYROXINE 88 MCG TABLET: 88 | 90 days supply | Qty: 90 | Fill #1

## 2015-10-24 MED FILL — PANTOPRAZOLE SOD DR 40 MG T: 40 | 90 days supply | Qty: 180 | Fill #0

## 2015-10-31 DIAGNOSIS — G43009 Migraine without aura, not intractable, without status migrainosus: Secondary | ICD-10-CM | POA: Diagnosis not present

## 2015-10-31 DIAGNOSIS — R32 Unspecified urinary incontinence: Secondary | ICD-10-CM | POA: Diagnosis not present

## 2015-10-31 DIAGNOSIS — G44219 Episodic tension-type headache, not intractable: Secondary | ICD-10-CM | POA: Diagnosis not present

## 2015-11-17 ENCOUNTER — Emergency Department (HOSPITAL_COMMUNITY): Payer: 59

## 2015-11-17 ENCOUNTER — Emergency Department (HOSPITAL_COMMUNITY)
Admission: EM | Admit: 2015-11-17 | Discharge: 2015-11-17 | Disposition: A | Discharge status: Home / Self Care | Payer: 59 | Attending: Emergency Medicine | Admitting: Emergency Medicine

## 2015-11-17 ENCOUNTER — Encounter (HOSPITAL_COMMUNITY): Payer: Self-pay | Admitting: Cardiology

## 2015-11-17 DIAGNOSIS — M79601 Pain in right arm: Secondary | ICD-10-CM | POA: Diagnosis not present

## 2015-11-17 DIAGNOSIS — R42 Dizziness and giddiness: Secondary | ICD-10-CM | POA: Insufficient documentation

## 2015-11-17 DIAGNOSIS — R32 Unspecified urinary incontinence: Secondary | ICD-10-CM | POA: Diagnosis not present

## 2015-11-17 DIAGNOSIS — R079 Chest pain, unspecified: Secondary | ICD-10-CM | POA: Diagnosis not present

## 2015-11-17 LAB — BASIC METABOLIC PANEL
Anion gap: 13 (ref 5–15)
BUN: 7 mg/dL (ref 6–20)
CALCIUM: 9.9 mg/dL (ref 8.9–10.3)
CO2: 25 mmol/L (ref 22–32)
CREATININE: 1.18 mg/dL — AB (ref 0.44–1.00)
Chloride: 103 mmol/L (ref 101–111)
GFR calc Af Amer: >60 mL/min (ref >60)
GFR calc non Af Amer: 53 mL/min — ABNORMAL LOW (ref >60)
GLUCOSE: 123 mg/dL — AB (ref 65–99)
Potassium: 3.9 mmol/L (ref 3.5–5.1)
Sodium: 141 mmol/L (ref 135–145)

## 2015-11-17 LAB — CBC
HCT: 41.4 % (ref 36.0–46.0)
Hemoglobin: 13.9 g/dL (ref 12.0–15.0)
MCH: 30.3 pg (ref 26.0–34.0)
MCHC: 33.6 g/dL (ref 30.0–36.0)
MCV: 90.4 fL (ref 78.0–100.0)
PLATELETS: 408 10*3/uL — AB (ref 150–400)
RBC: 4.58 MIL/uL (ref 3.87–5.11)
RDW: 12.7 % (ref 11.5–15.5)
WBC: 6.6 10*3/uL (ref 4.0–10.5)

## 2015-11-17 LAB — I-STAT TROPONIN, ED: TROPONIN I, POC: 0 ng/mL (ref 0.00–0.08)

## 2015-11-17 NOTE — ED Notes (Signed)
Pt reports she developed a GERD sensation yesterday that has moved up into her left shoulder and pain down her right arm. Also reports dizziness.

## 2015-11-17 NOTE — ED Notes (Signed)
Pt decided to leave AMA.  Advised pt she needed to stay but she said she needed to go to work.

## 2015-11-18 MED FILL — ESTRADIOL 1 MG TABLET: 1 | 30 days supply | Qty: 30 | Fill #3

## 2015-11-18 MED FILL — ESTRADIOL 0.5 MG TABLET: 0.5 | 30 days supply | Qty: 30 | Fill #3

## 2015-11-28 ENCOUNTER — Other Ambulatory Visit (INDEPENDENT_AMBULATORY_CARE_PROVIDER_SITE_OTHER): Payer: 59

## 2015-11-28 ENCOUNTER — Encounter: Payer: Self-pay | Admitting: Internal Medicine

## 2015-11-28 ENCOUNTER — Ambulatory Visit (INDEPENDENT_AMBULATORY_CARE_PROVIDER_SITE_OTHER): Payer: 59 | Admitting: Internal Medicine

## 2015-11-28 VITALS — BP 104/68 | HR 87 | Temp 98.0°F | Resp 16 | Ht 60.0 in | Wt 142.0 lb

## 2015-11-28 DIAGNOSIS — E038 Other specified hypothyroidism: Secondary | ICD-10-CM

## 2015-11-28 DIAGNOSIS — R739 Hyperglycemia, unspecified: Secondary | ICD-10-CM | POA: Diagnosis not present

## 2015-11-28 DIAGNOSIS — R072 Precordial pain: Secondary | ICD-10-CM | POA: Diagnosis not present

## 2015-11-28 DIAGNOSIS — Z0000 Encounter for general adult medical examination without abnormal findings: Secondary | ICD-10-CM

## 2015-11-28 DIAGNOSIS — R079 Chest pain, unspecified: Secondary | ICD-10-CM | POA: Insufficient documentation

## 2015-11-28 LAB — LIPID PANEL
CHOLESTEROL: 212 mg/dL — AB (ref 0–200)
HDL: 65.4 mg/dL (ref >39.00)
LDL CALC: 123 mg/dL — AB (ref 0–99)
NonHDL: 146.34
TRIGLYCERIDES: 117 mg/dL (ref 0.0–149.0)
Total CHOL/HDL Ratio: 3
VLDL: 23.4 mg/dL (ref 0.0–40.0)

## 2015-11-28 LAB — URINALYSIS, ROUTINE W REFLEX MICROSCOPIC
BILIRUBIN URINE: NEGATIVE
HGB URINE DIPSTICK: NEGATIVE
Ketones, ur: NEGATIVE
LEUKOCYTES UA: NEGATIVE
NITRITE: NEGATIVE
RBC / HPF: NONE SEEN (ref 0–?)
Specific Gravity, Urine: <=1.005 — AB (ref 1.000–1.030)
TOTAL PROTEIN, URINE-UPE24: NEGATIVE
Urine Glucose: NEGATIVE
Urobilinogen, UA: 0.2 (ref 0.0–1.0)
WBC, UA: NONE SEEN (ref 0–?)
pH: 7 (ref 5.0–8.0)

## 2015-11-28 LAB — BASIC METABOLIC PANEL
BUN: 9 mg/dL (ref 6–23)
CHLORIDE: 102 meq/L (ref 96–112)
CO2: 27 mEq/L (ref 19–32)
Calcium: 9.6 mg/dL (ref 8.4–10.5)
Creatinine, Ser: 1 mg/dL (ref 0.40–1.20)
GFR: 75.32 mL/min (ref >60.00)
Glucose, Bld: 87 mg/dL (ref 70–99)
POTASSIUM: 4.2 meq/L (ref 3.5–5.1)
Sodium: 138 mEq/L (ref 135–145)

## 2015-11-28 LAB — TROPONIN I: TNIDX: 0 ug/l (ref 0.00–0.06)

## 2015-11-28 LAB — HEMOGLOBIN A1C: Hgb A1c MFr Bld: 6.2 % (ref 4.6–6.5)

## 2015-11-28 LAB — TSH: TSH: 1.08 u[IU]/mL (ref 0.35–4.50)

## 2015-11-28 NOTE — Patient Instructions (Signed)
Preventive Care for Adults, Female A healthy lifestyle and preventive care can promote health and wellness. Preventive health guidelines for women include the following key practices.  A routine yearly physical is a good way to check with your health care provider about your health and preventive screening. It is a chance to share any concerns and updates on your health and to receive a thorough exam.  Visit your dentist for a routine exam and preventive care every 6 months. Brush your teeth twice a day and floss once a day. Good oral hygiene prevents tooth decay and gum disease.  The frequency of eye exams is based on your age, health, family medical history, use of contact lenses, and other factors. Follow your health care provider's recommendations for frequency of eye exams.  Eat a healthy diet. Foods like vegetables, fruits, whole grains, low-fat dairy products, and lean protein foods contain the nutrients you need without too many calories. Decrease your intake of foods high in solid fats, added sugars, and salt. Eat the right amount of calories for you.Get information about a proper diet from your health care provider, if necessary.  Regular physical exercise is one of the most important things you can do for your health. Most adults should get at least 150 minutes of moderate-intensity exercise (any activity that increases your heart rate and causes you to sweat) each week. In addition, most adults need muscle-strengthening exercises on 2 or more days a week.  Maintain a healthy weight. The body mass index (BMI) is a screening tool to identify possible weight problems. It provides an estimate of body fat based on height and weight. Your health care provider can find your BMI and can help you achieve or maintain a healthy weight.For adults 20 years and older:  A BMI below 18.5 is considered underweight.  A BMI of 18.5 to 24.9 is normal.  A BMI of 25 to 29.9 is considered overweight.  A  BMI of 30 and above is considered obese.  Maintain normal blood lipids and cholesterol levels by exercising and minimizing your intake of saturated fat. Eat a balanced diet with plenty of fruit and vegetables. Blood tests for lipids and cholesterol should begin at age 45 and be repeated every 5 years. If your lipid or cholesterol levels are high, you are over 50, or you are at high risk for heart disease, you may need your cholesterol levels checked more frequently.Ongoing high lipid and cholesterol levels should be treated with medicines if diet and exercise are not working.  If you smoke, find out from your health care provider how to quit. If you do not use tobacco, do not start.  Lung cancer screening is recommended for adults aged 45-80 years who are at high risk for developing lung cancer because of a history of smoking. A yearly low-dose CT scan of the lungs is recommended for people who have at least a 30-pack-year history of smoking and are a current smoker or have quit within the past 15 years. A pack year of smoking is smoking an average of 1 pack of cigarettes a day for 1 year (for example: 1 pack a day for 30 years or 2 packs a day for 15 years). Yearly screening should continue until the smoker has stopped smoking for at least 15 years. Yearly screening should be stopped for people who develop a health problem that would prevent them from having lung cancer treatment.  If you are pregnant, do not drink alcohol. If you are  breastfeeding, be very cautious about drinking alcohol. If you are not pregnant and choose to drink alcohol, do not have more than 1 drink per day. One drink is considered to be 12 ounces (355 mL) of beer, 5 ounces (148 mL) of wine, or 1.5 ounces (44 mL) of liquor.  Avoid use of street drugs. Do not share needles with anyone. Ask for help if you need support or instructions about stopping the use of drugs.  High blood pressure causes heart disease and increases the risk  of stroke. Your blood pressure should be checked at least every 1 to 2 years. Ongoing high blood pressure should be treated with medicines if weight loss and exercise do not work.  If you are 55-79 years old, ask your health care provider if you should take aspirin to prevent strokes.  Diabetes screening is done by taking a blood sample to check your blood glucose level after you have not eaten for a certain period of time (fasting). If you are not overweight and you do not have risk factors for diabetes, you should be screened once every 3 years starting at age 45. If you are overweight or obese and you are 40-70 years of age, you should be screened for diabetes every year as part of your cardiovascular risk assessment.  Breast cancer screening is essential preventive care for women. You should practice "breast self-awareness." This means understanding the normal appearance and feel of your breasts and may include breast self-examination. Any changes detected, no matter how small, should be reported to a health care provider. Women in their 20s and 30s should have a clinical breast exam (CBE) by a health care provider as part of a regular health exam every 1 to 3 years. After age 40, women should have a CBE every year. Starting at age 40, women should consider having a mammogram (breast X-ray test) every year. Women who have a family history of breast cancer should talk to their health care provider about genetic screening. Women at a high risk of breast cancer should talk to their health care providers about having an MRI and a mammogram every year.  Breast cancer gene (BRCA)-related cancer risk assessment is recommended for women who have family members with BRCA-related cancers. BRCA-related cancers include breast, ovarian, tubal, and peritoneal cancers. Having family members with these cancers may be associated with an increased risk for harmful changes (mutations) in the breast cancer genes BRCA1 and  BRCA2. Results of the assessment will determine the need for genetic counseling and BRCA1 and BRCA2 testing.  Your health care provider may recommend that you be screened regularly for cancer of the pelvic organs (ovaries, uterus, and vagina). This screening involves a pelvic examination, including checking for microscopic changes to the surface of your cervix (Pap test). You may be encouraged to have this screening done every 3 years, beginning at age 21.  For women ages 30-65, health care providers may recommend pelvic exams and Pap testing every 3 years, or they may recommend the Pap and pelvic exam, combined with testing for human papilloma virus (HPV), every 5 years. Some types of HPV increase your risk of cervical cancer. Testing for HPV may also be done on women of any age with unclear Pap test results.  Other health care providers may not recommend any screening for nonpregnant women who are considered low risk for pelvic cancer and who do not have symptoms. Ask your health care provider if a screening pelvic exam is right for   you.  If you have had past treatment for cervical cancer or a condition that could lead to cancer, you need Pap tests and screening for cancer for at least 20 years after your treatment. If Pap tests have been discontinued, your risk factors (such as having a new sexual partner) need to be reassessed to determine if screening should resume. Some women have medical problems that increase the chance of getting cervical cancer. In these cases, your health care provider may recommend more frequent screening and Pap tests.  Colorectal cancer can be detected and often prevented. Most routine colorectal cancer screening begins at the age of 50 years and continues through age 75 years. However, your health care provider may recommend screening at an earlier age if you have risk factors for colon cancer. On a yearly basis, your health care provider may provide home test kits to check  for hidden blood in the stool. Use of a small camera at the end of a tube, to directly examine the colon (sigmoidoscopy or colonoscopy), can detect the earliest forms of colorectal cancer. Talk to your health care provider about this at age 50, when routine screening begins. Direct exam of the colon should be repeated every 5-10 years through age 75 years, unless early forms of precancerous polyps or small growths are found.  People who are at an increased risk for hepatitis B should be screened for this virus. You are considered at high risk for hepatitis B if:  You were born in a country where hepatitis B occurs often. Talk with your health care provider about which countries are considered high risk.  Your parents were born in a high-risk country and you have not received a shot to protect against hepatitis B (hepatitis B vaccine).  You have HIV or AIDS.  You use needles to inject street drugs.  You live with, or have sex with, someone who has hepatitis B.  You get hemodialysis treatment.  You take certain medicines for conditions like cancer, organ transplantation, and autoimmune conditions.  Hepatitis C blood testing is recommended for all people born from 1945 through 1965 and any individual with known risks for hepatitis C.  Practice safe sex. Use condoms and avoid high-risk sexual practices to reduce the spread of sexually transmitted infections (STIs). STIs include gonorrhea, chlamydia, syphilis, trichomonas, herpes, HPV, and human immunodeficiency virus (HIV). Herpes, HIV, and HPV are viral illnesses that have no cure. They can result in disability, cancer, and death.  You should be screened for sexually transmitted illnesses (STIs) including gonorrhea and chlamydia if:  You are sexually active and are younger than 24 years.  You are older than 24 years and your health care provider tells you that you are at risk for this type of infection.  Your sexual activity has changed  since you were last screened and you are at an increased risk for chlamydia or gonorrhea. Ask your health care provider if you are at risk.  If you are at risk of being infected with HIV, it is recommended that you take a prescription medicine daily to prevent HIV infection. This is called preexposure prophylaxis (PrEP). You are considered at risk if:  You are sexually active and do not regularly use condoms or know the HIV status of your partner(s).  You take drugs by injection.  You are sexually active with a partner who has HIV.  Talk with your health care provider about whether you are at high risk of being infected with HIV. If   you choose to begin PrEP, you should first be tested for HIV. You should then be tested every 3 months for as long as you are taking PrEP.  Osteoporosis is a disease in which the bones lose minerals and strength with aging. This can result in serious bone fractures or breaks. The risk of osteoporosis can be identified using a bone density scan. Women ages 67 years and over and women at risk for fractures or osteoporosis should discuss screening with their health care providers. Ask your health care provider whether you should take a calcium supplement or vitamin D to reduce the rate of osteoporosis.  Menopause can be associated with physical symptoms and risks. Hormone replacement therapy is available to decrease symptoms and risks. You should talk to your health care provider about whether hormone replacement therapy is right for you.  Use sunscreen. Apply sunscreen liberally and repeatedly throughout the day. You should seek shade when your shadow is shorter than you. Protect yourself by wearing long sleeves, pants, a wide-brimmed hat, and sunglasses year round, whenever you are outdoors.  Once a month, do a whole body skin exam, using a mirror to look at the skin on your back. Tell your health care provider of new moles, moles that have irregular borders, moles that  are larger than a pencil eraser, or moles that have changed in shape or color.  Stay current with required vaccines (immunizations).  Influenza vaccine. All adults should be immunized every year.  Tetanus, diphtheria, and acellular pertussis (Td, Tdap) vaccine. Pregnant women should receive 1 dose of Tdap vaccine during each pregnancy. The dose should be obtained regardless of the length of time since the last dose. Immunization is preferred during the 27th-36th week of gestation. An adult who has not previously received Tdap or who does not know her vaccine status should receive 1 dose of Tdap. This initial dose should be followed by tetanus and diphtheria toxoids (Td) booster doses every 10 years. Adults with an unknown or incomplete history of completing a 3-dose immunization series with Td-containing vaccines should begin or complete a primary immunization series including a Tdap dose. Adults should receive a Td booster every 10 years.  Varicella vaccine. An adult without evidence of immunity to varicella should receive 2 doses or a second dose if she has previously received 1 dose. Pregnant females who do not have evidence of immunity should receive the first dose after pregnancy. This first dose should be obtained before leaving the health care facility. The second dose should be obtained 4-8 weeks after the first dose.  Human papillomavirus (HPV) vaccine. Females aged 13-26 years who have not received the vaccine previously should obtain the 3-dose series. The vaccine is not recommended for use in pregnant females. However, pregnancy testing is not needed before receiving a dose. If a female is found to be pregnant after receiving a dose, no treatment is needed. In that case, the remaining doses should be delayed until after the pregnancy. Immunization is recommended for any person with an immunocompromised condition through the age of 61 years if she did not get any or all doses earlier. During the  3-dose series, the second dose should be obtained 4-8 weeks after the first dose. The third dose should be obtained 24 weeks after the first dose and 16 weeks after the second dose.  Zoster vaccine. One dose is recommended for adults aged 30 years or older unless certain conditions are present.  Measles, mumps, and rubella (MMR) vaccine. Adults born  before 1957 generally are considered immune to measles and mumps. Adults born in 1957 or later should have 1 or more doses of MMR vaccine unless there is a contraindication to the vaccine or there is laboratory evidence of immunity to each of the three diseases. A routine second dose of MMR vaccine should be obtained at least 28 days after the first dose for students attending postsecondary schools, health care workers, or international travelers. People who received inactivated measles vaccine or an unknown type of measles vaccine during 1963-1967 should receive 2 doses of MMR vaccine. People who received inactivated mumps vaccine or an unknown type of mumps vaccine before 1979 and are at high risk for mumps infection should consider immunization with 2 doses of MMR vaccine. For females of childbearing age, rubella immunity should be determined. If there is no evidence of immunity, females who are not pregnant should be vaccinated. If there is no evidence of immunity, females who are pregnant should delay immunization until after pregnancy. Unvaccinated health care workers born before 1957 who lack laboratory evidence of measles, mumps, or rubella immunity or laboratory confirmation of disease should consider measles and mumps immunization with 2 doses of MMR vaccine or rubella immunization with 1 dose of MMR vaccine.  Pneumococcal 13-valent conjugate (PCV13) vaccine. When indicated, a person who is uncertain of his immunization history and has no record of immunization should receive the PCV13 vaccine. All adults 65 years of age and older should receive this  vaccine. An adult aged 19 years or older who has certain medical conditions and has not been previously immunized should receive 1 dose of PCV13 vaccine. This PCV13 should be followed with a dose of pneumococcal polysaccharide (PPSV23) vaccine. Adults who are at high risk for pneumococcal disease should obtain the PPSV23 vaccine at least 8 weeks after the dose of PCV13 vaccine. Adults older than 51 years of age who have normal immune system function should obtain the PPSV23 vaccine dose at least 1 year after the dose of PCV13 vaccine.  Pneumococcal polysaccharide (PPSV23) vaccine. When PCV13 is also indicated, PCV13 should be obtained first. All adults aged 65 years and older should be immunized. An adult younger than age 65 years who has certain medical conditions should be immunized. Any person who resides in a nursing home or long-term care facility should be immunized. An adult smoker should be immunized. People with an immunocompromised condition and certain other conditions should receive both PCV13 and PPSV23 vaccines. People with human immunodeficiency virus (HIV) infection should be immunized as soon as possible after diagnosis. Immunization during chemotherapy or radiation therapy should be avoided. Routine use of PPSV23 vaccine is not recommended for American Indians, [place] Natives, or people younger than 65 years unless there are medical conditions that require PPSV23 vaccine. When indicated, people who have unknown immunization and have no record of immunization should receive PPSV23 vaccine. One-time revaccination 5 years after the first dose of PPSV23 is recommended for people aged 19-64 years who have chronic kidney failure, nephrotic syndrome, asplenia, or immunocompromised conditions. People who received 1-2 doses of PPSV23 before age 65 years should receive another dose of PPSV23 vaccine at age 65 years or later if at least 5 years have passed since the previous dose. Doses of PPSV23 are not  needed for people immunized with PPSV23 at or after age 65 years.  Meningococcal vaccine. Adults with asplenia or persistent complement component deficiencies should receive 2 doses of quadrivalent meningococcal conjugate (MenACWY-D) vaccine. The doses should be obtained   at least 2 months apart. Microbiologists working with certain meningococcal bacteria, [employer] recruits, people at risk during an outbreak, and people who travel to or live in countries with a high rate of meningitis should be immunized. A first-year college student up through age 54 years who is living in a residence hall should receive a dose if she did not receive a dose on or after her 16th birthday. Adults who have certain high-risk conditions should receive one or more doses of vaccine.  Hepatitis A vaccine. Adults who wish to be protected from this disease, have certain high-risk conditions, work with hepatitis A-infected animals, work in hepatitis A research labs, or travel to or work in countries with a high rate of hepatitis A should be immunized. Adults who were previously unvaccinated and who anticipate close contact with an international adoptee during the first 60 days after arrival in the [place] from a country with a high rate of hepatitis A should be immunized.  Hepatitis B vaccine. Adults who wish to be protected from this disease, have certain high-risk conditions, may be exposed to blood or other infectious body fluids, are household contacts or sex partners of hepatitis B positive people, are clients or workers in certain care facilities, or travel to or work in countries with a high rate of hepatitis B should be immunized.  Haemophilus influenzae type b (Hib) vaccine. A previously unvaccinated person with asplenia or sickle cell disease or having a scheduled splenectomy should receive 1 dose of Hib vaccine. Regardless of previous immunization, a recipient of a hematopoietic stem cell transplant should receive a  3-dose series 6-12 months after her successful transplant. Hib vaccine is not recommended for adults with HIV infection. Preventive Services / Frequency Ages 62 to 72 years  Blood pressure check.** / Every 3-5 years.  Lipid and cholesterol check.** / Every 5 years beginning at age 75.  Clinical breast exam.** / Every 3 years for women in their 38s and 61s.  BRCA-related cancer risk assessment.** / For women who have family members with a BRCA-related cancer (breast, ovarian, tubal, or peritoneal cancers).  Pap test.** / Every 2 years from ages 57 through 18. Every 3 years starting at age 4 through age 43 or 87 with a history of 3 consecutive normal Pap tests.  HPV screening.** / Every 3 years from ages 35 through ages 40 to 55 with a history of 3 consecutive normal Pap tests.  Hepatitis C blood test.** / For any individual with known risks for hepatitis C.  Skin self-exam. / Monthly.  Influenza vaccine. / Every year.  Tetanus, diphtheria, and acellular pertussis (Tdap, Td) vaccine.** / Consult your health care provider. Pregnant women should receive 1 dose of Tdap vaccine during each pregnancy. 1 dose of Td every 10 years.  Varicella vaccine.** / Consult your health care provider. Pregnant females who do not have evidence of immunity should receive the first dose after pregnancy.  HPV vaccine. / 3 doses over 6 months, if 77 and younger. The vaccine is not recommended for use in pregnant females. However, pregnancy testing is not needed before receiving a dose.  Measles, mumps, rubella (MMR) vaccine.** / You need at least 1 dose of MMR if you were born in 1957 or later. You may also need a 2nd dose. For females of childbearing age, rubella immunity should be determined. If there is no evidence of immunity, females who are not pregnant should be vaccinated. If there is no evidence of immunity, females who are  pregnant should delay immunization until after pregnancy.  Pneumococcal  13-valent conjugate (PCV13) vaccine.** / Consult your health care provider.  Pneumococcal polysaccharide (PPSV23) vaccine.** / 1 to 2 doses if you smoke cigarettes or if you have certain conditions.  Meningococcal vaccine.** / 1 dose if you are age 68 to 8 years and a [employer] researcher living in a residence hall, or have one of several medical conditions, you need to get vaccinated against meningococcal disease. You may also need additional booster doses.  Hepatitis A vaccine.** / Consult your health care provider.  Hepatitis B vaccine.** / Consult your health care provider.  Haemophilus influenzae type b (Hib) vaccine.** / Consult your health care provider. Ages 7 to 53 years  Blood pressure check.** / Every year.  Lipid and cholesterol check.** / Every 5 years beginning at age 25 years.  Lung cancer screening. / Every year if you are aged 11-80 years and have a 30-pack-year history of smoking and currently smoke or have quit within the past 15 years. Yearly screening is stopped once you have quit smoking for at least 15 years or develop a health problem that would prevent you from having lung cancer treatment.  Clinical breast exam.** / Every year after age 48 years.  BRCA-related cancer risk assessment.** / For women who have family members with a BRCA-related cancer (breast, ovarian, tubal, or peritoneal cancers).  Mammogram.** / Every year beginning at age 41 years and continuing for as long as you are in good health. Consult with your health care provider.  Pap test.** / Every 3 years starting at age 65 years through age 37 or 70 years with a history of 3 consecutive normal Pap tests.  HPV screening.** / Every 3 years from ages 72 years through ages 60 to 40 years with a history of 3 consecutive normal Pap tests.  Fecal occult blood test (FOBT) of stool. / Every year beginning at age 21 years and continuing until age 5 years. You may not need to do this test if you get  a colonoscopy every 10 years.  Flexible sigmoidoscopy or colonoscopy.** / Every 5 years for a flexible sigmoidoscopy or every 10 years for a colonoscopy beginning at age 35 years and continuing until age 48 years.  Hepatitis C blood test.** / For all people born from [birth year] through 1965 and any individual with known risks for hepatitis C.  Skin self-exam. / Monthly.  Influenza vaccine. / Every year.  Tetanus, diphtheria, and acellular pertussis (Tdap/Td) vaccine.** / Consult your health care provider. Pregnant women should receive 1 dose of Tdap vaccine during each pregnancy. 1 dose of Td every 10 years.  Varicella vaccine.** / Consult your health care provider. Pregnant females who do not have evidence of immunity should receive the first dose after pregnancy.  Zoster vaccine.** / 1 dose for adults aged 30 years or older.  Measles, mumps, rubella (MMR) vaccine.** / You need at least 1 dose of MMR if you were born in 1957 or later. You may also need a second dose. For females of childbearing age, rubella immunity should be determined. If there is no evidence of immunity, females who are not pregnant should be vaccinated. If there is no evidence of immunity, females who are pregnant should delay immunization until after pregnancy.  Pneumococcal 13-valent conjugate (PCV13) vaccine.** / Consult your health care provider.  Pneumococcal polysaccharide (PPSV23) vaccine.** / 1 to 2 doses if you smoke cigarettes or if you have certain conditions.  Meningococcal vaccine.** /  Consult your health care provider.  Hepatitis A vaccine.** / Consult your health care provider.  Hepatitis B vaccine.** / Consult your health care provider.  Haemophilus influenzae type b (Hib) vaccine.** / Consult your health care provider. Ages 64 years and over  Blood pressure check.** / Every year.  Lipid and cholesterol check.** / Every 5 years beginning at age 23 years.  Lung cancer screening. / Every year if you  are aged 16-80 years and have a 30-pack-year history of smoking and currently smoke or have quit within the past 15 years. Yearly screening is stopped once you have quit smoking for at least 15 years or develop a health problem that would prevent you from having lung cancer treatment.  Clinical breast exam.** / Every year after age 74 years.  BRCA-related cancer risk assessment.** / For women who have family members with a BRCA-related cancer (breast, ovarian, tubal, or peritoneal cancers).  Mammogram.** / Every year beginning at age 44 years and continuing for as long as you are in good health. Consult with your health care provider.  Pap test.** / Every 3 years starting at age 58 years through age 22 or 39 years with 3 consecutive normal Pap tests. Testing can be stopped between 65 and 70 years with 3 consecutive normal Pap tests and no abnormal Pap or HPV tests in the past 10 years.  HPV screening.** / Every 3 years from ages 64 years through ages 70 or 61 years with a history of 3 consecutive normal Pap tests. Testing can be stopped between 65 and 70 years with 3 consecutive normal Pap tests and no abnormal Pap or HPV tests in the past 10 years.  Fecal occult blood test (FOBT) of stool. / Every year beginning at age 40 years and continuing until age 27 years. You may not need to do this test if you get a colonoscopy every 10 years.  Flexible sigmoidoscopy or colonoscopy.** / Every 5 years for a flexible sigmoidoscopy or every 10 years for a colonoscopy beginning at age 7 years and continuing until age 32 years.  Hepatitis C blood test.** / For all people born from [birth year] through 1966 and any individual with known risks for hepatitis C.  Osteoporosis screening.** / A one-time screening for women ages 30 years and over and women at risk for fractures or osteoporosis.  Skin self-exam. / Monthly.  Influenza vaccine. / Every year.  Tetanus, diphtheria, and acellular pertussis (Tdap/Td)  vaccine.** / 1 dose of Td every 10 years.  Varicella vaccine.** / Consult your health care provider.  Zoster vaccine.** / 1 dose for adults aged 35 years or older.  Pneumococcal 13-valent conjugate (PCV13) vaccine.** / Consult your health care provider.  Pneumococcal polysaccharide (PPSV23) vaccine.** / 1 dose for all adults aged 46 years and older.  Meningococcal vaccine.** / Consult your health care provider.  Hepatitis A vaccine.** / Consult your health care provider.  Hepatitis B vaccine.** / Consult your health care provider.  Haemophilus influenzae type b (Hib) vaccine.** / Consult your health care provider. ** Family history and personal history of risk and conditions may change your health care provider's recommendations.   This information is not intended to replace advice given to you by your health care provider. Make sure you discuss any questions you have with your health care provider.   Document Released: 10/23/2001 Document Revised: 09/17/2014 Document Reviewed: 01/22/2011 Elsevier Interactive Patient Education [employer].

## 2015-11-28 NOTE — Progress Notes (Signed)
Subjective:  Patient ID: Chelsey Hunt, female    DOB: 1966/08/16  Age: 50 y.o. MRN: [id number]  CC: Annual Exam; Hypothyroidism; and Chest Pain   HPI Chelsey Hunt presents for a complete physical as well as follow-up on hypothyroidism and complains of left-sided chest pain for about a week or 2. She tells me she has a history of heart murmur and is seeing her cardiologist later this week. She complains for the last week of an achy sensation on the left side of her chest that radiates into her left arm. She was seen in the emergency room a week ago and she was discharge. The chest pain is resolving and she complains of fatigue. She denies diaphoresis, shortness of breath, dyspnea on exertion, dizziness, palpitations, syncope, or edema.  Outpatient Prescriptions Prior to Visit  Medication Sig Dispense Refill  . cetirizine (ZYRTEC) 10 MG tablet 1 tablet as needed for allergies     . diclofenac (VOLTAREN) 75 MG EC tablet Take 1 tablet (75 mg total) by mouth 2 (two) times daily. 50 tablet 2  . levothyroxine (SYNTHROID, LEVOTHROID) 75 MCG tablet Take 1 tablet (75 mcg total) by mouth daily. (Patient taking differently: Take 88 mcg by mouth daily. ) 90 tablet 1  . pantoprazole (PROTONIX) 40 MG tablet Take 40 mg by mouth 2 (two) times daily.    . Pediatric Multiple Vit-C-FA (FLINSTONES GUMMIES OMEGA-3 DHA) CHEW Chew by mouth daily.    . Riboflavin (B-2) 100 MG TABS Take 1 tablet by mouth daily.      . rizatriptan (MAXALT) 10 MG tablet Take 10 mg by mouth as needed for migraine. May repeat in 2 hours if needed    . Simethicone ([employer] GAS RELIEF ULTRA STRENGTH) 180 MG CAPS Take by mouth as needed.       No facility-administered medications prior to visit.    ROS Review of Systems  Constitutional: Positive for fatigue. Negative for fever, chills, diaphoresis, activity change, appetite change and unexpected weight change.  HENT: Negative.  Negative for congestion, sore throat and trouble  swallowing.   Eyes: Negative.  Negative for visual disturbance.  Respiratory: Negative.  Negative for cough, choking, chest tightness, shortness of breath and stridor.   Cardiovascular: Positive for chest pain. Negative for palpitations and leg swelling.  Gastrointestinal: Negative.  Negative for nausea, vomiting, abdominal pain, diarrhea, constipation and blood in stool.  Endocrine: Negative.   Genitourinary: Negative.   Musculoskeletal: Negative.  Negative for myalgias, back pain, joint swelling, arthralgias and neck pain.  Skin: Negative.  Negative for color change and rash.  Allergic/Immunologic: Negative.   Neurological: Negative.  Negative for dizziness.  Hematological: Negative.  Negative for adenopathy. Does not bruise/bleed easily.  Psychiatric/Behavioral: Negative.  Negative for sleep disturbance, dysphoric mood and decreased concentration. The patient is not nervous/anxious.     Objective:  BP 104/68 mmHg  Pulse 87  Temp(Src) 98 F (36.7 C) (Oral)  Resp 16  Ht 5' (1.524 m)  Wt 142 lb (64.411 kg)  BMI 27.73 kg/m2  SpO2 99%  BP Readings from Last 3 Encounters:  11/28/15 104/68  11/17/15 118/53  04/28/15 115/66    Wt Readings from Last 3 Encounters:  11/28/15 142 lb (64.411 kg)  04/20/15 131 lb (59.421 kg)  01/12/15 132 lb (59.875 kg)    Physical Exam  Constitutional: She is oriented to person, place, and time. She appears well-developed and well-nourished.  Non-toxic appearance. She does not have a sickly appearance. She does not appear ill.  No distress.  HENT:  Head: Normocephalic and atraumatic.  Mouth/Throat: Oropharynx is clear and moist. No oropharyngeal exudate.  Eyes: Conjunctivae are normal. Right eye exhibits no discharge. Left eye exhibits no discharge. No scleral icterus.  Neck: Normal range of motion. Neck supple. No JVD present. No tracheal deviation present. No thyromegaly present.  Cardiovascular: Normal rate, regular rhythm, normal heart sounds  and intact distal pulses.  Exam reveals no gallop and no friction rub.   No murmur heard. EKG -  Sinus  Rhythm  WITHIN NORMAL LIMITS   Pulmonary/Chest: Effort normal and breath sounds normal. No stridor. No respiratory distress. She has no wheezes. She has no rales. She exhibits no tenderness.  Abdominal: Soft. Bowel sounds are normal. She exhibits no distension and no mass. There is no tenderness. There is no rebound and no guarding.  Musculoskeletal: Normal range of motion. She exhibits no edema or tenderness.  Lymphadenopathy:    She has no cervical adenopathy.  Neurological: She is oriented to person, place, and time.  Skin: Skin is warm and dry. No rash noted. She is not diaphoretic. No erythema. No pallor.  Vitals reviewed.   Lab Results  Component Value Date   WBC 6.6 11/17/2015   HGB 13.9 11/17/2015   HCT 41.4 11/17/2015   PLT 408* 11/17/2015   GLUCOSE 123* 11/17/2015   CHOL 202* 10/23/2013   TRIG 94.0 10/23/2013   HDL 52.70 10/23/2013   LDLDIRECT 133.5 10/23/2013   LDLCALC 122* 07/30/2012   ALT 17 12/21/2014   AST 19 12/21/2014   NA 141 11/17/2015   K 3.9 11/17/2015   CL 103 11/17/2015   CREATININE 1.18* 11/17/2015   BUN 7 11/17/2015   CO2 25 11/17/2015   TSH 0.37 01/12/2015    Dg Chest 2 View  11/17/2015  CLINICAL DATA:  Chest pain for 2 days. EXAM: CHEST  2 VIEW COMPARISON:  02/06/2008 FINDINGS: The heart size and mediastinal contours are within normal limits. Both lungs are clear. The visualized skeletal structures are unremarkable. IMPRESSION: No active cardiopulmonary disease. Electronically Signed   By: Van Clines M.D.   On: 11/17/2015 15:19    Assessment & Plan:   Geraldy was seen today for annual exam, hypothyroidism and chest pain.  Diagnoses and all orders for this visit:  Other specified hypothyroidism- will recheck her TSH and will adjust her dose if needed. -     Lipid panel; Future -     TSH; Future  Hyperglycemia- I will check her  A1c to see if she has developed type 2 diabetes mellitus. -     Basic metabolic panel; Future -     Hemoglobin A1c; Future  Routine general medical examination at a health care facility- her Pap smear and mammogram are up-to-date as of her recent visit in November 2016 with her gynecologist, exam completed today, labs ordered will be reviewed, she was given patient education material, vaccines were reviewed. -     Urinalysis, Routine w reflex microscopic (not at [facility]); Future  Precordial chest pain- her EKG remains normal and her troponin is negative, I don't hear murmur today but I encouraged her to keep her appointment later this week with her cardiologist.  -     EKG 12-Lead -     Troponin I; Future   I am having Ms. Lim maintain her Simethicone, cetirizine, B-2, pantoprazole, FLINSTONES GUMMIES OMEGA-3 DHA, rizatriptan, levothyroxine, and diclofenac.  No orders of the defined types were placed in this encounter.  Follow-up: Return in about 6 months (around 05/30/2016).  [name], MD

## 2015-11-28 NOTE — Progress Notes (Signed)
Pre visit review using our clinic review tool, if applicable. No additional management support is needed unless otherwise documented below in the visit note. 

## 2015-11-29 ENCOUNTER — Encounter: Payer: Self-pay | Admitting: Internal Medicine

## 2015-11-30 ENCOUNTER — Telehealth: Payer: Self-pay | Admitting: Internal Medicine

## 2015-11-30 NOTE — Telephone Encounter (Signed)
Pt called in and would like someone to call her with her lab results

## 2015-12-01 NOTE — Telephone Encounter (Signed)
Patient aware of lab results.

## 2015-12-02 DIAGNOSIS — R0789 Other chest pain: Secondary | ICD-10-CM | POA: Diagnosis not present

## 2015-12-02 DIAGNOSIS — K219 Gastro-esophageal reflux disease without esophagitis: Secondary | ICD-10-CM | POA: Diagnosis not present

## 2015-12-02 DIAGNOSIS — R0602 Shortness of breath: Secondary | ICD-10-CM | POA: Diagnosis not present

## 2015-12-14 DIAGNOSIS — R011 Cardiac murmur, unspecified: Secondary | ICD-10-CM | POA: Diagnosis not present

## 2015-12-14 DIAGNOSIS — R0609 Other forms of dyspnea: Secondary | ICD-10-CM | POA: Diagnosis not present

## 2015-12-19 DIAGNOSIS — R011 Cardiac murmur, unspecified: Secondary | ICD-10-CM | POA: Diagnosis not present

## 2015-12-19 DIAGNOSIS — R0600 Dyspnea, unspecified: Secondary | ICD-10-CM | POA: Diagnosis not present

## 2015-12-21 MED FILL — ESTRADIOL 1 MG TABLET: 1 | 30 days supply | Qty: 30 | Fill #4

## 2015-12-21 MED FILL — ESTRADIOL 0.5 MG TABLET: 0.5 | 30 days supply | Qty: 30 | Fill #4

## 2015-12-22 ENCOUNTER — Other Ambulatory Visit: Payer: Self-pay | Admitting: Internal Medicine

## 2015-12-27 DIAGNOSIS — R0789 Other chest pain: Secondary | ICD-10-CM | POA: Diagnosis not present

## 2015-12-27 DIAGNOSIS — I340 Nonrheumatic mitral (valve) insufficiency: Secondary | ICD-10-CM | POA: Diagnosis not present

## 2016-01-02 MED FILL — CYCLOBENZAPRINE 10 MG TAB: 10 | 10 days supply | Qty: 30 | Fill #0

## 2016-01-06 DIAGNOSIS — R32 Unspecified urinary incontinence: Secondary | ICD-10-CM | POA: Diagnosis not present

## 2016-01-20 MED FILL — ESTRADIOL 1 MG TABLET: 1 | 30 days supply | Qty: 30 | Fill #5

## 2016-01-20 MED FILL — ESTRADIOL 0.5 MG TABLET: 0.5 | 30 days supply | Qty: 30 | Fill #5

## 2016-01-20 MED FILL — LEVOTHYROXINE 88 MCG TABLET: 88 | 90 days supply | Qty: 90 | Fill #2

## 2016-01-23 DIAGNOSIS — L819 Disorder of pigmentation, unspecified: Secondary | ICD-10-CM | POA: Diagnosis not present

## 2016-01-23 DIAGNOSIS — D2371 Other benign neoplasm of skin of right lower limb, including hip: Secondary | ICD-10-CM | POA: Diagnosis not present

## 2016-01-23 DIAGNOSIS — L821 Other seborrheic keratosis: Secondary | ICD-10-CM | POA: Diagnosis not present

## 2016-01-23 DIAGNOSIS — L245 Irritant contact dermatitis due to other chemical products: Secondary | ICD-10-CM | POA: Diagnosis not present

## 2016-01-23 DIAGNOSIS — L239 Allergic contact dermatitis, unspecified cause: Secondary | ICD-10-CM | POA: Diagnosis not present

## 2016-01-23 DIAGNOSIS — D225 Melanocytic nevi of trunk: Secondary | ICD-10-CM | POA: Diagnosis not present

## 2016-02-21 MED FILL — ESTRADIOL 0.5 MG TABLET: 0.5 | 30 days supply | Qty: 30 | Fill #6

## 2016-02-21 MED FILL — ESTRADIOL 1 MG TABLET: 1 | 30 days supply | Qty: 30 | Fill #6

## 2016-02-28 DIAGNOSIS — G43009 Migraine without aura, not intractable, without status migrainosus: Secondary | ICD-10-CM | POA: Diagnosis not present

## 2016-02-28 DIAGNOSIS — G44219 Episodic tension-type headache, not intractable: Secondary | ICD-10-CM | POA: Diagnosis not present

## 2016-03-01 DIAGNOSIS — H52223 Regular astigmatism, bilateral: Secondary | ICD-10-CM | POA: Diagnosis not present

## 2016-03-08 MED FILL — RIZATRIPTAN 10 MG TABLET: 10 | 30 days supply | Qty: 9 | Fill #0

## 2016-03-12 MED FILL — PROMETHAZINE 25 MG TABLET: 25 | 3 days supply | Qty: 15 | Fill #1

## 2016-03-22 MED FILL — ESTRADIOL 1 MG TABLET: 1 | 30 days supply | Qty: 30 | Fill #7

## 2016-03-22 MED FILL — ESTRADIOL 0.5 MG TABLET: 0.5 | 30 days supply | Qty: 30 | Fill #7

## 2016-03-28 MED FILL — PANTOPRAZOLE SOD DR 40 MG T: 40 | 90 days supply | Qty: 180 | Fill #1

## 2016-04-18 MED FILL — LEVOTHYROXINE 88 MCG TABLET: 88 | 90 days supply | Qty: 90 | Fill #3

## 2016-04-24 MED FILL — ESTRADIOL 0.5 MG TABLET: 0.5 | 30 days supply | Qty: 30 | Fill #8

## 2016-04-24 MED FILL — ESTRADIOL 1 MG TABLET: 1 | 30 days supply | Qty: 30 | Fill #8

## 2016-05-23 DIAGNOSIS — R110 Nausea: Secondary | ICD-10-CM | POA: Diagnosis not present

## 2016-05-23 DIAGNOSIS — R1011 Right upper quadrant pain: Secondary | ICD-10-CM | POA: Diagnosis not present

## 2016-05-23 DIAGNOSIS — R140 Abdominal distension (gaseous): Secondary | ICD-10-CM | POA: Diagnosis not present

## 2016-05-23 DIAGNOSIS — K581 Irritable bowel syndrome with constipation: Secondary | ICD-10-CM | POA: Diagnosis not present

## 2016-05-23 MED FILL — DICYCLOMINE 10 MG CAPSULE: 10 | 30 days supply | Qty: 90 | Fill #0

## 2016-05-25 MED FILL — ESTRADIOL 0.5 MG TABLET: 0.5 | 30 days supply | Qty: 30 | Fill #9

## 2016-05-25 MED FILL — ESTRADIOL 1 MG TABLET: 1 | 30 days supply | Qty: 30 | Fill #9

## 2016-06-13 ENCOUNTER — Ambulatory Visit (INDEPENDENT_AMBULATORY_CARE_PROVIDER_SITE_OTHER): Payer: 59 | Admitting: Internal Medicine

## 2016-06-13 ENCOUNTER — Other Ambulatory Visit (INDEPENDENT_AMBULATORY_CARE_PROVIDER_SITE_OTHER): Payer: 59

## 2016-06-13 ENCOUNTER — Encounter: Payer: Self-pay | Admitting: Internal Medicine

## 2016-06-13 VITALS — BP 102/60 | HR 97 | Temp 98.4°F | Resp 16 | Ht 60.0 in | Wt 134.0 lb

## 2016-06-13 DIAGNOSIS — R739 Hyperglycemia, unspecified: Secondary | ICD-10-CM

## 2016-06-13 DIAGNOSIS — E032 Hypothyroidism due to medicaments and other exogenous substances: Secondary | ICD-10-CM

## 2016-06-13 DIAGNOSIS — K2100 Gastro-esophageal reflux disease with esophagitis, without bleeding: Secondary | ICD-10-CM

## 2016-06-13 DIAGNOSIS — Z0000 Encounter for general adult medical examination without abnormal findings: Secondary | ICD-10-CM

## 2016-06-13 LAB — CBC WITH DIFFERENTIAL/PLATELET
BASOS ABS: 0 10*3/uL (ref 0.0–0.1)
BASOS PCT: 0.6 % (ref 0.0–3.0)
EOS ABS: 0.3 10*3/uL (ref 0.0–0.7)
Eosinophils Relative: 4.8 % (ref 0.0–5.0)
HEMATOCRIT: 41.2 % (ref 36.0–46.0)
HEMOGLOBIN: 14.1 g/dL (ref 12.0–15.0)
LYMPHS PCT: 44.1 % (ref 12.0–46.0)
Lymphs Abs: 2.4 10*3/uL (ref 0.7–4.0)
MCHC: 34.2 g/dL (ref 30.0–36.0)
MCV: 89.2 fl (ref 78.0–100.0)
MONO ABS: 0.4 10*3/uL (ref 0.1–1.0)
Monocytes Relative: 6.6 % (ref 3.0–12.0)
Neutro Abs: 2.4 10*3/uL (ref 1.4–7.7)
Neutrophils Relative %: 43.9 % (ref 43.0–77.0)
Platelets: 416 10*3/uL — ABNORMAL HIGH (ref 150.0–400.0)
RBC: 4.63 Mil/uL (ref 3.87–5.11)
RDW: 12.9 % (ref 11.5–15.5)
WBC: 5.5 10*3/uL (ref 4.0–10.5)

## 2016-06-13 LAB — BASIC METABOLIC PANEL
BUN: 9 mg/dL (ref 6–23)
CHLORIDE: 103 meq/L (ref 96–112)
CO2: 30 mEq/L (ref 19–32)
Calcium: 9.5 mg/dL (ref 8.4–10.5)
Creatinine, Ser: 0.92 mg/dL (ref 0.40–1.20)
GFR: 82.75 mL/min (ref >60.00)
GLUCOSE: 88 mg/dL (ref 70–99)
POTASSIUM: 4 meq/L (ref 3.5–5.1)
SODIUM: 140 meq/L (ref 135–145)

## 2016-06-13 LAB — HEMOGLOBIN A1C: Hgb A1c MFr Bld: 5.7 % (ref 4.6–6.5)

## 2016-06-13 LAB — TSH: TSH: 0.23 u[IU]/mL — AB (ref 0.35–4.50)

## 2016-06-13 MED ORDER — LEVOTHYROXINE SODIUM 50 MCG PO TABS: 50.0000 ug | ORAL_TABLET | Freq: Every day | ORAL | 1 refills | Status: DC

## 2016-06-13 NOTE — Progress Notes (Signed)
Pre visit review using our clinic review tool, if applicable. No additional management support is needed unless otherwise documented below in the visit note. 

## 2016-06-13 NOTE — Patient Instructions (Signed)

## 2016-06-13 NOTE — Progress Notes (Signed)
Subjective:  Patient ID: Chelsey Hunt, female    DOB: 23-Sep-1965  Age: 50 y.o. MRN: [id number]  CC: Hypothyroidism   HPI Chelsey Hunt presents for follow-up on hypothyroidism and acid reflux disease. She has had a few episodes of fatigue and constipation, she uses MiraLAX and gets adequate symptom relief of the constipation. She rarely has insomnia but doesn't request anything to treat the insomnia. She otherwise feels well and offers no other complaints.  Outpatient Medications Prior to Visit  Medication Sig Dispense Refill  . cetirizine (ZYRTEC) 10 MG tablet 1 tablet as needed for allergies     . pantoprazole (PROTONIX) 40 MG tablet Take 40 mg by mouth 2 (two) times daily.    . Riboflavin (B-2) 100 MG TABS Take 1 tablet by mouth daily.      . rizatriptan (MAXALT) 10 MG tablet Take 10 mg by mouth as needed for migraine. May repeat in 2 hours if needed    . levothyroxine (SYNTHROID, LEVOTHROID) 75 MCG tablet Take 1 tablet (75 mcg total) by mouth daily. (Patient taking differently: Take 88 mcg by mouth daily. ) 90 tablet 1  . Pediatric Multiple Vit-C-FA (FLINSTONES GUMMIES OMEGA-3 DHA) CHEW Chew by mouth daily.    . Simethicone ([employer] GAS RELIEF ULTRA STRENGTH) 180 MG CAPS Take by mouth as needed.      . diclofenac (VOLTAREN) 75 MG EC tablet Take 1 tablet (75 mg total) by mouth 2 (two) times daily. 50 tablet 2   No facility-administered medications prior to visit.     ROS Review of Systems  Constitutional: Positive for fatigue. Negative for activity change, appetite change, chills, diaphoresis and fever.  HENT: Negative.  Negative for sinus pressure, sore throat and trouble swallowing.   Eyes: Negative.  Negative for visual disturbance.  Respiratory: Negative.  Negative for cough, choking, chest tightness, shortness of breath and stridor.   Cardiovascular: Negative.  Negative for chest pain, palpitations and leg swelling.  Gastrointestinal: Positive for constipation. Negative  for abdominal pain, diarrhea, nausea and vomiting.  Endocrine: Negative.   Genitourinary: Negative.  Negative for difficulty urinating.  Musculoskeletal: Negative.  Negative for back pain, myalgias and neck pain.  Skin: Negative.  Negative for color change, pallor and rash.  Allergic/Immunologic: Negative.   Neurological: Negative.  Negative for dizziness, tremors, weakness, light-headedness and headaches.  Hematological: Negative.  Negative for adenopathy. Does not bruise/bleed easily.  Psychiatric/Behavioral: Negative.  Negative for behavioral problems.    Objective:  BP 102/60 (BP Location: Left Arm, Patient Position: Sitting, Cuff Size: Normal)   Pulse 97   Temp 98.4 F (36.9 C) (Oral)   Resp 16   Ht 5' (1.524 m)   Wt 134 lb (60.8 kg)   SpO2 97%   BMI 26.17 kg/m   BP Readings from Last 3 Encounters:  06/13/16 102/60  11/28/15 104/68  11/17/15 (!) 118/53    Wt Readings from Last 3 Encounters:  06/13/16 134 lb (60.8 kg)  11/28/15 142 lb (64.4 kg)  04/20/15 131 lb (59.4 kg)    Physical Exam  Constitutional: She is oriented to person, place, and time. No distress.  HENT:  Mouth/Throat: Oropharynx is clear and moist. No oropharyngeal exudate.  Eyes: Conjunctivae are normal. Right eye exhibits no discharge. Left eye exhibits no discharge. No scleral icterus.  Neck: Normal range of motion. Neck supple. No JVD present. No tracheal deviation present. No thyromegaly present.  Cardiovascular: Normal rate, regular rhythm, normal heart sounds and intact distal pulses.  Exam reveals  no gallop and no friction rub.   No murmur heard. Pulmonary/Chest: Effort normal and breath sounds normal. No stridor. No respiratory distress. She has no wheezes. She has no rales. She exhibits no tenderness.  Abdominal: Soft. Bowel sounds are normal. She exhibits no distension and no mass. There is no tenderness. There is no rebound and no guarding.  Musculoskeletal: Normal range of motion. She  exhibits no edema, tenderness or deformity.  Lymphadenopathy:    She has no cervical adenopathy.  Neurological: She is oriented to person, place, and time.  Skin: Skin is warm and dry. No rash noted. She is not diaphoretic. No erythema. No pallor.  Psychiatric: She has a normal mood and affect. Her behavior is normal. Judgment and thought content normal.  Vitals reviewed.   Lab Results  Component Value Date   WBC 5.5 06/13/2016   HGB 14.1 06/13/2016   HCT 41.2 06/13/2016   PLT 416.0 (H) 06/13/2016   GLUCOSE 88 06/13/2016   CHOL 212 (H) 11/28/2015   TRIG 117.0 11/28/2015   HDL 65.40 11/28/2015   LDLDIRECT 133.5 10/23/2013   LDLCALC 123 (H) 11/28/2015   ALT 17 12/21/2014   AST 19 12/21/2014   NA 140 06/13/2016   K 4.0 06/13/2016   CL 103 06/13/2016   CREATININE 0.92 06/13/2016   BUN 9 06/13/2016   CO2 30 06/13/2016   TSH 0.23 (L) 06/13/2016   HGBA1C 5.7 06/13/2016    Dg Chest 2 View  Result Date: 11/17/2015 CLINICAL DATA:  Chest pain for 2 days. EXAM: CHEST  2 VIEW COMPARISON:  02/06/2008 FINDINGS: The heart size and mediastinal contours are within normal limits. Both lungs are clear. The visualized skeletal structures are unremarkable. IMPRESSION: No active cardiopulmonary disease. Electronically Signed   By: Van Clines M.D.   On: 11/17/2015 15:19    Assessment & Plan:   Somia was seen today for hypothyroidism.  Diagnoses and all orders for this visit:  Hypothyroidism due to medication- her TSH is suppressed so I have lowered her Synthroid dose, I've asked to return in about 2 months for recheck of her TSH level. -     TSH; Future -     levothyroxine (SYNTHROID, LEVOTHROID) 50 MCG tablet; Take 1 tablet (50 mcg total) by mouth daily.  Gastroesophageal reflux disease with esophagitis- her symptoms are well controlled with PPI, will continue. -     CBC with Differential/Platelet; Future  Routine general medical examination at a health care  facility  Hyperglycemia- her A1c is 5.7%, she has very mild prediabetes, no medications are needed at this time, I will continue to monitor her blood sugars. -     Basic metabolic panel; Future -     Hemoglobin A1c; Future   I have discontinued Ms. Kneece's Simethicone, FLINSTONES GUMMIES OMEGA-3 DHA, levothyroxine, and diclofenac. I am also having her start on levothyroxine. Additionally, I am having her maintain her cetirizine, B-2, pantoprazole, rizatriptan, dicyclomine, estradiol, and estradiol.  Meds ordered this encounter  Medications  . dicyclomine (BENTYL) 10 MG capsule    Sig: Take 10 mg by mouth.  . estradiol (ESTRACE) 1 MG tablet  . estradiol (ESTRACE) 0.5 MG tablet    Refill:  12  . levothyroxine (SYNTHROID, LEVOTHROID) 50 MCG tablet    Sig: Take 1 tablet (50 mcg total) by mouth daily.    Dispense:  90 tablet    Refill:  1     Follow-up: Return in about 6 months (around 12/12/2016).  [name], MD

## 2016-06-14 ENCOUNTER — Encounter: Payer: Self-pay | Admitting: Internal Medicine

## 2016-06-21 DIAGNOSIS — G43009 Migraine without aura, not intractable, without status migrainosus: Secondary | ICD-10-CM | POA: Diagnosis not present

## 2016-06-21 DIAGNOSIS — G44219 Episodic tension-type headache, not intractable: Secondary | ICD-10-CM | POA: Diagnosis not present

## 2016-06-28 ENCOUNTER — Telehealth: Payer: Self-pay | Admitting: Internal Medicine

## 2016-06-28 NOTE — Telephone Encounter (Signed)
Pt called in and would like the result of her blood work.  Can you call her when you get a chance?   ° ° °

## 2016-06-29 MED FILL — ESTRADIOL 1 MG TABLET: 1 | 30 days supply | Qty: 30 | Fill #10

## 2016-06-29 MED FILL — LEVOTHYROXINE 50 MCG TABLET: 50 | 90 days supply | Qty: 90 | Fill #0

## 2016-06-29 MED FILL — ESTRADIOL 0.5 MG TABLET: 0.5 | 30 days supply | Qty: 30 | Fill #10

## 2016-06-29 NOTE — Telephone Encounter (Signed)
Pt informed of results and her my chart passport has been changed per pt request.

## 2016-06-30 ENCOUNTER — Encounter: Payer: Self-pay | Admitting: Internal Medicine

## 2016-07-02 DIAGNOSIS — G43019 Migraine without aura, intractable, without status migrainosus: Secondary | ICD-10-CM | POA: Insufficient documentation

## 2016-07-02 DIAGNOSIS — K5900 Constipation, unspecified: Secondary | ICD-10-CM | POA: Diagnosis not present

## 2016-07-02 DIAGNOSIS — R110 Nausea: Secondary | ICD-10-CM | POA: Diagnosis not present

## 2016-07-02 DIAGNOSIS — K219 Gastro-esophageal reflux disease without esophagitis: Secondary | ICD-10-CM | POA: Diagnosis not present

## 2016-07-03 ENCOUNTER — Other Ambulatory Visit: Payer: Self-pay | Admitting: Internal Medicine

## 2016-07-03 DIAGNOSIS — E039 Hypothyroidism, unspecified: Secondary | ICD-10-CM

## 2016-07-03 MED ORDER — LEVOTHYROXINE SODIUM 75 MCG PO TABS: 75.0000 ug | ORAL_TABLET | Freq: Every day | ORAL | 0 refills | Status: DC

## 2016-07-03 MED FILL — LEVOTHYROXINE 75 MCG TABLET: 75 | 90 days supply | Qty: 90 | Fill #0

## 2016-07-09 DIAGNOSIS — G43011 Migraine without aura, intractable, with status migrainosus: Secondary | ICD-10-CM | POA: Diagnosis not present

## 2016-07-09 MED FILL — RIZATRIPTAN 10 MG TABLET: 10 | 30 days supply | Qty: 9 | Fill #1

## 2016-07-30 MED FILL — ESTRADIOL 0.5 MG TABLET: 0.5 | 30 days supply | Qty: 30 | Fill #11

## 2016-07-30 MED FILL — ESTRADIOL 1 MG TABLET: 1 | 30 days supply | Qty: 30 | Fill #11

## 2016-08-20 MED FILL — PANTOPRAZOLE SOD DR 40 MG T: 40 | 90 days supply | Qty: 180 | Fill #2

## 2016-08-21 DIAGNOSIS — Z6827 Body mass index (BMI) 27.0-27.9, adult: Secondary | ICD-10-CM | POA: Diagnosis not present

## 2016-08-21 DIAGNOSIS — Z1231 Encounter for screening mammogram for malignant neoplasm of breast: Secondary | ICD-10-CM | POA: Diagnosis not present

## 2016-08-21 DIAGNOSIS — N951 Menopausal and female climacteric states: Secondary | ICD-10-CM | POA: Diagnosis not present

## 2016-08-21 DIAGNOSIS — Z01419 Encounter for gynecological examination (general) (routine) without abnormal findings: Secondary | ICD-10-CM | POA: Diagnosis not present

## 2016-08-21 MED FILL — ESTRADIOL 1 MG TABLET: 1 | 90 days supply | Qty: 90 | Fill #0

## 2016-08-29 ENCOUNTER — Other Ambulatory Visit (INDEPENDENT_AMBULATORY_CARE_PROVIDER_SITE_OTHER): Payer: 59

## 2016-08-29 ENCOUNTER — Encounter: Payer: Self-pay | Admitting: Nurse Practitioner

## 2016-08-29 ENCOUNTER — Ambulatory Visit (INDEPENDENT_AMBULATORY_CARE_PROVIDER_SITE_OTHER): Payer: 59 | Admitting: Nurse Practitioner

## 2016-08-29 VITALS — BP 118/80 | HR 79 | Temp 97.5°F | Ht 60.0 in | Wt 138.0 lb

## 2016-08-29 DIAGNOSIS — R5383 Other fatigue: Secondary | ICD-10-CM

## 2016-08-29 DIAGNOSIS — R5381 Other malaise: Secondary | ICD-10-CM | POA: Diagnosis not present

## 2016-08-29 LAB — CBC WITH DIFFERENTIAL/PLATELET
Basophils Absolute: 0 10*3/uL (ref 0.0–0.1)
Basophils Relative: 0.3 % (ref 0.0–3.0)
EOS ABS: 0.2 10*3/uL (ref 0.0–0.7)
EOS PCT: 3.5 % (ref 0.0–5.0)
HEMATOCRIT: 40 % (ref 36.0–46.0)
HEMOGLOBIN: 13.7 g/dL (ref 12.0–15.0)
LYMPHS PCT: 40 % (ref 12.0–46.0)
Lymphs Abs: 2.7 10*3/uL (ref 0.7–4.0)
MCHC: 34.2 g/dL (ref 30.0–36.0)
MCV: 89.9 fl (ref 78.0–100.0)
MONO ABS: 0.4 10*3/uL (ref 0.1–1.0)
Monocytes Relative: 5.3 % (ref 3.0–12.0)
Neutro Abs: 3.5 10*3/uL (ref 1.4–7.7)
Neutrophils Relative %: 50.9 % (ref 43.0–77.0)
Platelets: 429 10*3/uL — ABNORMAL HIGH (ref 150.0–400.0)
RBC: 4.45 Mil/uL (ref 3.87–5.11)
RDW: 13.3 % (ref 11.5–15.5)
WBC: 6.8 10*3/uL (ref 4.0–10.5)

## 2016-08-29 LAB — BASIC METABOLIC PANEL
BUN: 11 mg/dL (ref 6–23)
CO2: 28 mEq/L (ref 19–32)
CREATININE: 0.94 mg/dL (ref 0.40–1.20)
Calcium: 9.4 mg/dL (ref 8.4–10.5)
Chloride: 100 mEq/L (ref 96–112)
GFR: 80.65 mL/min (ref >60.00)
Glucose, Bld: 91 mg/dL (ref 70–99)
POTASSIUM: 3.8 meq/L (ref 3.5–5.1)
Sodium: 136 mEq/L (ref 135–145)

## 2016-08-29 LAB — URINALYSIS, ROUTINE W REFLEX MICROSCOPIC
BILIRUBIN URINE: NEGATIVE
HGB URINE DIPSTICK: NEGATIVE
KETONES UR: NEGATIVE
Leukocytes, UA: NEGATIVE
Nitrite: NEGATIVE
PH: 7 (ref 5.0–8.0)
Specific Gravity, Urine: 1.01 (ref 1.000–1.030)
Total Protein, Urine: NEGATIVE
UROBILINOGEN UA: 0.2 (ref 0.0–1.0)
Urine Glucose: NEGATIVE

## 2016-08-29 LAB — T4, FREE: Free T4: 0.99 ng/dL (ref 0.60–1.60)

## 2016-08-29 LAB — TSH: TSH: 3.93 u[IU]/mL (ref 0.35–4.50)

## 2016-08-29 NOTE — Progress Notes (Signed)
Pre visit review using our clinic review tool, if applicable. No additional management support is needed unless otherwise documented below in the visit note. 

## 2016-08-29 NOTE — Progress Notes (Signed)
Subjective:  Patient ID: Chelsey Hunt, female    DOB: Oct 23, 1965  Age: 50 y.o. MRN: [id number]  CC: Fatigue (headache,weakness,hot flashes. concern about her thyroid. )   HPI  Chelsey Hunt presents with persistent fatigue x 2weeks. Denies any recent [place]. No change in GU/GI function. No fever.no muscle pain or joint pain. Has hx of Migraines, relieved by tylenol. No change in headache characteristics.  Outpatient Medications Prior to Visit  Medication Sig Dispense Refill  . cetirizine (ZYRTEC) 10 MG tablet 1 tablet as needed for allergies     . dicyclomine (BENTYL) 10 MG capsule Take 10 mg by mouth.    . estradiol (ESTRACE) 0.5 MG tablet   12  . estradiol (ESTRACE) 1 MG tablet     . levothyroxine (SYNTHROID, LEVOTHROID) 75 MCG tablet Take 1 tablet (75 mcg total) by mouth daily. 90 tablet 0  . pantoprazole (PROTONIX) 40 MG tablet Take 40 mg by mouth 2 (two) times daily.    . Riboflavin (B-2) 100 MG TABS Take 1 tablet by mouth daily.      . rizatriptan (MAXALT) 10 MG tablet Take 10 mg by mouth as needed for migraine. May repeat in 2 hours if needed     No facility-administered medications prior to visit.     ROS See HPI  Objective:  BP 118/80   Pulse 79   Temp 97.5 F (36.4 C)   Ht 5' (1.524 m)   Wt 138 lb (62.6 kg)   SpO2 100%   BMI 26.95 kg/m   BP Readings from Last 3 Encounters:  08/29/16 118/80  06/13/16 102/60  11/28/15 104/68    Wt Readings from Last 3 Encounters:  08/29/16 138 lb (62.6 kg)  06/13/16 134 lb (60.8 kg)  11/28/15 142 lb (64.4 kg)    Physical Exam  Constitutional: She is oriented to person, place, and time. No distress.  HENT:  Right Ear: External ear normal.  Left Ear: External ear normal.  Nose: Nose normal.  Mouth/Throat: No oropharyngeal exudate.  Eyes: No scleral icterus.  Neck: Normal range of motion. Neck supple.  Cardiovascular: Normal rate, regular rhythm and normal heart sounds.   Pulmonary/Chest: Effort normal and breath  sounds normal. No respiratory distress.  Abdominal: Soft. She exhibits no distension.  Musculoskeletal: Normal range of motion. She exhibits no edema.  Lymphadenopathy:    She has no cervical adenopathy.  Neurological: She is alert and oriented to person, place, and time.  Skin: Skin is warm and dry.  Psychiatric: She has a normal mood and affect. Her behavior is normal.    Lab Results  Component Value Date   WBC 6.8 08/29/2016   HGB 13.7 08/29/2016   HCT 40.0 08/29/2016   PLT 429.0 (H) 08/29/2016   GLUCOSE 91 08/29/2016   CHOL 212 (H) 11/28/2015   TRIG 117.0 11/28/2015   HDL 65.40 11/28/2015   LDLDIRECT 133.5 10/23/2013   LDLCALC 123 (H) 11/28/2015   ALT 17 12/21/2014   AST 19 12/21/2014   NA 136 08/29/2016   K 3.8 08/29/2016   CL 100 08/29/2016   CREATININE 0.94 08/29/2016   BUN 11 08/29/2016   CO2 28 08/29/2016   TSH 3.93 08/29/2016   HGBA1C 5.7 06/13/2016    Dg Chest 2 View  Result Date: 11/17/2015 CLINICAL DATA:  Chest pain for 2 days. EXAM: CHEST  2 VIEW COMPARISON:  02/06/2008 FINDINGS: The heart size and mediastinal contours are within normal limits. Both lungs are clear. The visualized skeletal structures  are unremarkable. IMPRESSION: No active cardiopulmonary disease. Electronically Signed   By: Van Clines M.D.   On: 11/17/2015 15:19    Assessment & Plan:   Kharlie was seen today for fatigue.  Diagnoses and all orders for this visit:  Malaise and fatigue -     Urinalysis, Routine w reflex microscopic; Future -     Basic metabolic panel; Future -     CBC w/Diff; Future -     TSH; Future -     T4, free; Future   I am having Ms. Bergemann maintain her cetirizine, B-2, pantoprazole, rizatriptan, dicyclomine, estradiol, estradiol, levothyroxine, and CoQ10.  Meds ordered this encounter  Medications  . Coenzyme Q10 (COQ10) 100 MG CAPS    Sig: Take 100 capsules by mouth 2 (two) times daily.   Recent Results (from the past 2160 hour(s))  CBC with  Differential/Platelet     Status: Abnormal   Collection Time: 06/13/16  3:58 PM  Result Value Ref Range   WBC 5.5 4.0 - 10.5 K/uL   RBC 4.63 3.87 - 5.11 Mil/uL   Hemoglobin 14.1 12.0 - 15.0 g/dL   HCT 41.2 36.0 - 46.0 %   MCV 89.2 78.0 - 100.0 fl   MCHC 34.2 30.0 - 36.0 g/dL   RDW 12.9 11.5 - 15.5 %   Platelets 416.0 (H) 150.0 - 400.0 K/uL   Neutrophils Relative % 43.9 43.0 - 77.0 %   Lymphocytes Relative 44.1 12.0 - 46.0 %   Monocytes Relative 6.6 3.0 - 12.0 %   Eosinophils Relative 4.8 0.0 - 5.0 %   Basophils Relative 0.6 0.0 - 3.0 %   Neutro Abs 2.4 1.4 - 7.7 K/uL   Lymphs Abs 2.4 0.7 - 4.0 K/uL   Monocytes Absolute 0.4 0.1 - 1.0 K/uL   Eosinophils Absolute 0.3 0.0 - 0.7 K/uL   Basophils Absolute 0.0 0.0 - 0.1 K/uL  Basic metabolic panel     Status: None   Collection Time: 06/13/16  3:58 PM  Result Value Ref Range   Sodium 140 135 - 145 mEq/L   Potassium 4.0 3.5 - 5.1 mEq/L   Chloride 103 96 - 112 mEq/L   CO2 30 19 - 32 mEq/L   Glucose, Bld 88 70 - 99 mg/dL   BUN 9 6 - 23 mg/dL   Creatinine, Ser 0.92 0.40 - 1.20 mg/dL   Calcium 9.5 8.4 - 10.5 mg/dL   GFR 82.75 >60.00 mL/min  TSH     Status: Abnormal   Collection Time: 06/13/16  3:58 PM  Result Value Ref Range   TSH 0.23 (L) 0.35 - 4.50 uIU/mL  Hemoglobin A1c     Status: None   Collection Time: 06/13/16  3:58 PM  Result Value Ref Range   Hgb A1c MFr Bld 5.7 4.6 - 6.5 %    Comment: Glycemic Control Guidelines for People with Diabetes:Non Diabetic:  <6%Goal of Therapy: <7%Additional Action Suggested:  >8%   Urinalysis, Routine w reflex microscopic     Status: None   Collection Time: 08/29/16  8:46 AM  Result Value Ref Range   Color, Urine YELLOW Yellow;Lt. Yellow   APPearance CLEAR Clear   Specific Gravity, Urine 1.010 1.000 - 1.030   pH 7.0 5.0 - 8.0   Total Protein, Urine NEGATIVE Negative   Urine Glucose NEGATIVE Negative   Ketones, ur NEGATIVE Negative   Bilirubin Urine NEGATIVE Negative   Hgb urine dipstick  NEGATIVE Negative   Urobilinogen, UA 0.2 0.0 - 1.0  Leukocytes, UA NEGATIVE Negative   Nitrite NEGATIVE Negative   RBC / HPF 0-2/hpf 0000000  Basic metabolic panel     Status: None   Collection Time: 08/29/16  8:46 AM  Result Value Ref Range   Sodium 136 135 - 145 mEq/L   Potassium 3.8 3.5 - 5.1 mEq/L   Chloride 100 96 - 112 mEq/L   CO2 28 19 - 32 mEq/L   Glucose, Bld 91 70 - 99 mg/dL   BUN 11 6 - 23 mg/dL   Creatinine, Ser 0.94 0.40 - 1.20 mg/dL   Calcium 9.4 8.4 - 10.5 mg/dL   GFR 80.65 >60.00 mL/min  CBC w/Diff     Status: Abnormal   Collection Time: 08/29/16  8:46 AM  Result Value Ref Range   WBC 6.8 4.0 - 10.5 K/uL   RBC 4.45 3.87 - 5.11 Mil/uL   Hemoglobin 13.7 12.0 - 15.0 g/dL   HCT 40.0 36.0 - 46.0 %   MCV 89.9 78.0 - 100.0 fl   MCHC 34.2 30.0 - 36.0 g/dL   RDW 13.3 11.5 - 15.5 %   Platelets 429.0 (H) 150.0 - 400.0 K/uL   Neutrophils Relative % 50.9 43.0 - 77.0 %   Lymphocytes Relative 40.0 12.0 - 46.0 %   Monocytes Relative 5.3 3.0 - 12.0 %   Eosinophils Relative 3.5 0.0 - 5.0 %   Basophils Relative 0.3 0.0 - 3.0 %   Neutro Abs 3.5 1.4 - 7.7 K/uL   Lymphs Abs 2.7 0.7 - 4.0 K/uL   Monocytes Absolute 0.4 0.1 - 1.0 K/uL   Eosinophils Absolute 0.2 0.0 - 0.7 K/uL   Basophils Absolute 0.0 0.0 - 0.1 K/uL  TSH     Status: None   Collection Time: 08/29/16  8:46 AM  Result Value Ref Range   TSH 3.93 0.35 - 4.50 uIU/mL  T4, free     Status: None   Collection Time: 08/29/16  8:46 AM  Result Value Ref Range   Free T4 0.99 0.60 - 1.60 ng/dL    Comment: Specimens from patients who are undergoing biotin therapy and /or ingesting biotin supplements may contain high levels of biotin.  The higher biotin concentration in these specimens interferes with this Free T4 assay.  Specimens that contain high levels  of biotin may cause false high results for this Free T4 assay.  Please interpret results in light of the total clinical presentation of the patient.      Follow-up:  Return if symptoms worsen or fail to improve.  Wilfred Lacy, NP

## 2016-08-29 NOTE — Patient Instructions (Signed)
Go to basement for lab draw.  you will be called with results.  Push oral fluids

## 2016-08-29 NOTE — Progress Notes (Signed)
Normal results, see office note

## 2016-08-31 LAB — HM MAMMOGRAPHY: HM Mammogram: NORMAL (ref 0–4)

## 2016-09-17 MED FILL — ESTRADIOL 0.5 MG TABLET: 0.5 | 30 days supply | Qty: 30 | Fill #0

## 2016-10-01 ENCOUNTER — Other Ambulatory Visit: Payer: Self-pay | Admitting: Internal Medicine

## 2016-10-01 DIAGNOSIS — E039 Hypothyroidism, unspecified: Secondary | ICD-10-CM

## 2016-10-01 MED FILL — LEVOTHYROXINE 75 MCG TABLET: 75 | 90 days supply | Qty: 90 | Fill #0

## 2016-10-15 MED FILL — ESTRADIOL 0.5 MG TABLET: 0.5 | 30 days supply | Qty: 30 | Fill #1

## 2016-10-23 DIAGNOSIS — G44219 Episodic tension-type headache, not intractable: Secondary | ICD-10-CM | POA: Diagnosis not present

## 2016-10-23 DIAGNOSIS — G43019 Migraine without aura, intractable, without status migrainosus: Secondary | ICD-10-CM | POA: Diagnosis not present

## 2016-10-24 MED FILL — levETIRAcetam 250 MG TABS: 250 | 30 days supply | Qty: 60 | Fill #0

## 2016-11-14 MED FILL — ESTRADIOL 2 MG TABLET: 2 | 30 days supply | Qty: 30 | Fill #0

## 2016-12-12 MED FILL — ESTRADIOL 2 MG TABLET: 2 | 30 days supply | Qty: 30 | Fill #1

## 2016-12-13 MED FILL — PANTOPRAZOLE SOD DR 40 MG T: 40 | 90 days supply | Qty: 180 | Fill #0

## 2016-12-31 DIAGNOSIS — G43011 Migraine without aura, intractable, with status migrainosus: Secondary | ICD-10-CM | POA: Diagnosis not present

## 2016-12-31 MED FILL — LEVOTHYROXINE 75 MCG TABLET: 75 | 90 days supply | Qty: 90 | Fill #1

## 2017-01-02 ENCOUNTER — Encounter (HOSPITAL_COMMUNITY): Payer: Self-pay | Admitting: Emergency Medicine

## 2017-01-02 ENCOUNTER — Emergency Department (HOSPITAL_COMMUNITY)
Admission: EM | Admit: 2017-01-02 | Discharge: 2017-01-02 | Disposition: A | Discharge status: Home / Self Care | Payer: 59 | Attending: Emergency Medicine | Admitting: Emergency Medicine

## 2017-01-02 DIAGNOSIS — G43009 Migraine without aura, not intractable, without status migrainosus: Secondary | ICD-10-CM | POA: Diagnosis not present

## 2017-01-02 DIAGNOSIS — E039 Hypothyroidism, unspecified: Secondary | ICD-10-CM | POA: Insufficient documentation

## 2017-01-02 DIAGNOSIS — G43909 Migraine, unspecified, not intractable, without status migrainosus: Secondary | ICD-10-CM | POA: Insufficient documentation

## 2017-01-02 HISTORY — DX: Migraine, unspecified, not intractable, without status migrainosus: G43.909

## 2017-01-02 MED ORDER — DIPHENHYDRAMINE HCL 50 MG/ML IJ SOLN
25.0000 mg | Freq: Once | INTRAMUSCULAR | Status: AC
  Administered 2017-01-02: 25 mg via INTRAVENOUS
  Filled 2017-01-02: qty 1

## 2017-01-02 MED ORDER — SODIUM CHLORIDE 0.9 % IV BOLUS (SEPSIS)
1000.0000 mL | Freq: Once | INTRAVENOUS | Status: AC
  Administered 2017-01-02: 1000 mL via INTRAVENOUS

## 2017-01-02 MED ORDER — METOCLOPRAMIDE HCL 5 MG/ML IJ SOLN
10.0000 mg | Freq: Once | INTRAMUSCULAR | Status: AC
  Administered 2017-01-02: 10 mg via INTRAVENOUS
  Filled 2017-01-02: qty 2

## 2017-01-02 MED ORDER — KETOROLAC TROMETHAMINE 30 MG/ML IJ SOLN
30.0000 mg | Freq: Once | INTRAMUSCULAR | Status: AC
  Administered 2017-01-02: 30 mg via INTRAVENOUS
  Filled 2017-01-02: qty 1

## 2017-01-02 MED FILL — RIZATRIPTAN 10 MG TABLET: 10 | 30 days supply | Qty: 18 | Fill #2

## 2017-01-02 NOTE — ED Provider Notes (Signed)
[facility] DEPT Provider Note   CSN: [id number] Arrival date & time: 01/02/17  1558     History   Chief Complaint Chief Complaint  Patient presents with  . Migraine    HPI Chelsey Hunt is a 51 y.o. female.  HPI    51 year old female presents today with complaints of migraine.  Patient has a significant past medical history of the same, diagnosed in 2004.  Patient is currently followed by neurology for this.  She notes that approximately 1 week ago she had her typical presentation of migraine, throbbing constant in nature, right-sided.  She notes associated decreased sensation along the cheek which is normal for her, photophobia, phonophobia.  She denies any distal neurological deficits, recent trauma, acute onset, notes stiffness, fever, or any other concerning red flags for headache.  Patient notes she saw her neurologist and received an injection of Toradol which seemed to mildly improve her symptoms.  Patient notes she also took promethazine, Maxalt, and Keppra with no significant improvement.  Patient denies any complicating features of her migraine.  She reports that she has been more stressed at work recently which could potentially be a trigger for her migraine.   Past Medical History:  Diagnosis Date  . ACID REFLUX DISEASE 01/03/2008  . Anemia   . ANEMIA, IRON DEFICIENCY 12/23/2008   history  . Chronic tension type headache 01/13/2009   migraines  . Complication of anesthesia    pt states she just needs a small amount or she will sleep too long  . Dysplasia of cervix, low grade (CIN 1)   . Fibroid   . GERD (gastroesophageal reflux disease)   . HEART MURMUR, SYSTOLIC 20/25/4270   will have echo 06/18/2013  . 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 Irritable bowel syndrome 12/23/2008  . Migraine   . Ovarian cyst   . Ovarian cyst   . THYROID NODULE, RIGHT 04/21/2009    Patient Active Problem List   Diagnosis Date Noted  . Hyperglycemia 11/28/2015  .  Pancreas divisum of native pancreas 12/27/2014  . Routine general medical examination at a health care facility 07/30/2012  . GERD (gastroesophageal reflux disease)   . Migraine 07/29/2011  . Hypothyroidism 08/12/2009  . Irritable bowel syndrome 12/23/2008    Past Surgical History:  Procedure Laterality Date  . COLONOSCOPY    . HYSTEROSCOPY    . LAPAROSCOPY    . NOVASURE ABLATION    . POLYPECTOMY    . TUBAL LIGATION    . UPPER GI ENDOSCOPY    . VAGINAL HYSTERECTOMY N/A 06/24/2013   Procedure: TOTAL HYSTERECTOMY VAGINAL;  Surgeon: Alwyn Pea, MD;  Location: [facility];  Service: Gynecology;  Laterality: N/A;    OB History    Gravida Para Term Preterm AB Living   3 3       3    SAB TAB Ectopic Multiple Live Births           3       Home Medications    Prior to Admission medications   Medication Sig Start Date End Date Taking? Authorizing Provider  cetirizine (ZYRTEC) 10 MG tablet 1 tablet as needed for allergies     Historical Provider, MD  Coenzyme Q10 (COQ10) 100 MG CAPS Take 100 capsules by mouth 2 (two) times daily.    Historical Provider, MD  dicyclomine (BENTYL) 10 MG capsule Take 10 mg by mouth. 05/23/16 05/23/17  Historical Provider, MD  estradiol (ESTRACE) 0.5 MG tablet  03/22/16   Historical Provider,  MD  estradiol (ESTRACE) 1 MG tablet  03/22/16   Historical Provider, MD  levothyroxine (SYNTHROID, LEVOTHROID) 75 MCG tablet TAKE 1 TABLET (75 MCG TOTAL) BY MOUTH DAILY. 10/01/16   Janith Lima, MD  pantoprazole (PROTONIX) 40 MG tablet Take 40 mg by mouth 2 (two) times daily.    Historical Provider, MD  Riboflavin (B-2) 100 MG TABS Take 1 tablet by mouth daily.      Historical Provider, MD  rizatriptan (MAXALT) 10 MG tablet Take 10 mg by mouth as needed for migraine. May repeat in 2 hours if needed    Historical Provider, MD    Family History Family History  Problem Relation Age of Onset  . Goiter Mother   . Heart disease Mother   . Thyroid disease Mother   .  Graves' disease Sister   . Asthma Sister   . Cancer Father   . Heart disease Father   . Goiter Other     Siblings 2  . Hypertension Paternal Grandfather   . Depression Neg Hx   . Alcohol abuse Neg Hx   . Drug abuse Neg Hx   . Early death Neg Hx   . Hearing loss Neg Hx   . Kidney disease Neg Hx   . Stroke Neg Hx     Social History Social History  Substance Use Topics  . Smoking status: Never Smoker  . Smokeless tobacco: Never Used  . Alcohol use No     Comment: occ     Allergies   Propoxyphene n-acetaminophen; Nabumetone; and Topiramate   Review of Systems Review of Systems  All other systems reviewed and are negative.    Physical Exam Updated Vital Signs BP 126/66 (BP Location: Left Arm)   Pulse 78   Temp 97.8 F (36.6 C) (Oral)   Resp 18   Ht 5' (1.524 m)   Wt 64.9 kg   SpO2 100%   BMI 27.93 kg/m   Physical Exam  Constitutional: She is oriented to person, place, and time. She appears well-developed and well-nourished. No distress.  HENT:  Head: Normocephalic and atraumatic.  Eyes: Conjunctivae and EOM are normal. Pupils are equal, round, and reactive to light. Right eye exhibits no discharge. Left eye exhibits no discharge. No scleral icterus.  Neck: Normal range of motion. Neck supple. No JVD present. No tracheal deviation present.  Pulmonary/Chest: Effort normal. No stridor.  Musculoskeletal: Normal range of motion. She exhibits no edema or tenderness.  Lymphadenopathy:    She has no cervical adenopathy.  Neurological: She is alert and oriented to person, place, and time. She has normal strength. She displays no atrophy and no tremor. No cranial nerve deficit or sensory deficit. She exhibits normal muscle tone. She displays a negative Romberg sign. She displays no seizure activity. Coordination and gait normal. GCS eye subscore is 4. GCS verbal subscore is 5. GCS motor subscore is 6.  Skin: She is not diaphoretic.  Psychiatric: She has a normal mood and  affect. Her behavior is normal. Judgment and thought content normal.  Nursing note and vitals reviewed.    ED Treatments / Results  Labs (all labs ordered are listed, but only abnormal results are displayed) Labs Reviewed - No data to display  EKG  EKG Interpretation None       Radiology No results found.  Procedures Procedures (including critical care time)  Medications Ordered in ED Medications  ketorolac (TORADOL) 30 MG/ML injection 30 mg (30 mg Intravenous Given 01/02/17 1829)  sodium chloride 0.9 % bolus 1,000 mL (1,000 mLs Intravenous New Bag/Given 01/02/17 1828)  metoCLOPramide (REGLAN) injection 10 mg (10 mg Intravenous Given 01/02/17 1833)  diphenhydrAMINE (BENADRYL) injection 25 mg (25 mg Intravenous Given 01/02/17 1830)     Initial Impression / Assessment and Plan / ED Course  I have reviewed the triage vital signs and the nursing notes.  Pertinent labs & imaging results that were available during my care of the patient were reviewed by me and considered in my medical decision making (see chart for details).     Final Clinical Impressions(s) / ED Diagnoses   Final diagnoses:  Migraine without status migrainosus, not intractable, unspecified migraine type    Therapeutics: Toradol, Reglan, Benadryl, normal saline   Assessment/Plan: 51 year old female presents today with migraine.  Today's presentation is typical of previous with no comp gating features.  She was given Toradol, Reglan, Benadryl and a liter of normal saline which significantly improved her symptoms.  She has no red flags that would require further evaluation or management here in the ED.  Patient is instructed to continue using her previously prescribed medications as needed for migraine.  Patient was given strict return precautions.  Pt verbalized understanding and agreement to today's plan had no further questions or concerns at the time of discharge.    New Prescriptions New Prescriptions    No medications on file     Okey Regal, PA-C 01/02/17 1945    Julianne Rice, MD 01/07/17 1505

## 2017-01-02 NOTE — Discharge Instructions (Signed)
Please read attached information. If you experience any new or worsening signs or symptoms please return to the emergency room for evaluation. Please follow-up with your primary care provider or specialist as discussed. Please use medication prescribed for migraines only as directed and discontinue taking if you have any concerning signs or symptoms.

## 2017-01-02 NOTE — ED Triage Notes (Signed)
Pt c/o migraine headache x's 1 week.  St's she went to her MD on Monday and was given Toradol IM.  St's it helped a little but did not completely subside.  Pt also c/o nausea and light sensitivity

## 2017-01-14 MED FILL — ESTRADIOL 2 MG TABLET: 2 | 30 days supply | Qty: 30 | Fill #2

## 2017-01-23 DIAGNOSIS — L219 Seborrheic dermatitis, unspecified: Secondary | ICD-10-CM | POA: Diagnosis not present

## 2017-01-23 MED FILL — HYDROCORTISONE 2.5% OINT: 2.5 | 14 days supply | Qty: 28 | Fill #0

## 2017-02-14 MED FILL — ESTRADIOL 2 MG TABLET: 2 | 30 days supply | Qty: 30 | Fill #3

## 2017-02-18 DIAGNOSIS — G43009 Migraine without aura, not intractable, without status migrainosus: Secondary | ICD-10-CM | POA: Diagnosis not present

## 2017-02-18 DIAGNOSIS — G44219 Episodic tension-type headache, not intractable: Secondary | ICD-10-CM | POA: Diagnosis not present

## 2017-03-15 MED FILL — ESTRADIOL 2 MG TABLET: 2 | 30 days supply | Qty: 30 | Fill #4

## 2017-03-17 ENCOUNTER — Encounter (HOSPITAL_COMMUNITY): Payer: Self-pay | Admitting: *Deleted

## 2017-03-17 ENCOUNTER — Ambulatory Visit (HOSPITAL_COMMUNITY)
Admission: EM | Admit: 2017-03-17 | Discharge: 2017-03-17 | Disposition: A | Discharge status: Home / Self Care | Payer: 59 | Attending: Family Medicine | Admitting: Family Medicine

## 2017-03-17 DIAGNOSIS — T7840XA Allergy, unspecified, initial encounter: Secondary | ICD-10-CM

## 2017-03-17 NOTE — Discharge Instructions (Addendum)
Today you were diagnosed with the following: 1. Allergic reaction, initial encounter    Given that your symptoms are improving we will watch closely for full resolution. You may take Benadryl as needed.  If you are not improving over the next few days or feel you are worsening please follow up here or the Emergency Department if you are unable to see your regular doctor.

## 2017-03-17 NOTE — ED Triage Notes (Signed)
Woke  Up  With  Face   And  Hands    Better  Now       No  New     Medications       Used  New     Eyeliner        And   Face   Lotion

## 2017-03-17 NOTE — ED Provider Notes (Signed)
  [facility]   [id number] 03/17/17 Arrival Time: 1214  ASSESSMENT & PLAN:  Today you were diagnosed with the following: 1. Allergic reaction, initial encounter    Given that your symptoms are improving we will watch closely for full resolution. You may take Benadryl as needed.  If you are not improving over the next few days or feel you are worsening please follow up here or the Emergency Department if you are unable to see your regular doctor.  Instructed on no makeup use for several days. Reviewed expectations re: course of current medical issues. Questions answered. Outlined signs and symptoms indicating need for more acute intervention. Patient verbalized understanding. After Visit Summary given.   SUBJECTIVE:  Chelsey Hunt is a 51 y.o. female who presents with complaint of swelling around eyes and of cheeks. Onset last evening. Fairly acute. Has been using new eyeliner and facial lotion this past week. Itching but improving. Swelling also improving. Afebrile. No visual changes. No self treatment.  ROS: As per HPI.   OBJECTIVE:  Vitals:   03/17/17 1320  BP: 118/72  Pulse: 78  Resp: 18  Temp: 98.6 F (37 C)  TempSrc: Oral  SpO2: 100%     General appearance: alert; no distress HEENT: normocephalic; atraumatic; conjunctivae normal; TMs normal; nasal mucosa normal; oral mucosa normal; under eyes she has slight edema; no erythema/rash/lesions Neck: supple Extremities: no cyanosis or edema; symmetrical with no gross deformities   Allergies  Allergen Reactions  . Propoxyphene N-Acetaminophen   . Nabumetone Rash    REACTION: GI upset  . Topiramate Rash    REACTION: rash    PMHx, SurgHx, SocialHx, Medications, and Allergies were reviewed in the Visit Navigator and updated as appropriate.      Vanessa Kick, MD 03/17/17 [phone]

## 2017-03-28 ENCOUNTER — Other Ambulatory Visit: Payer: Self-pay | Admitting: Internal Medicine

## 2017-03-28 DIAGNOSIS — E039 Hypothyroidism, unspecified: Secondary | ICD-10-CM

## 2017-03-29 ENCOUNTER — Other Ambulatory Visit: Payer: Self-pay | Admitting: Internal Medicine

## 2017-03-29 DIAGNOSIS — E039 Hypothyroidism, unspecified: Secondary | ICD-10-CM

## 2017-03-29 IMAGING — CR DG CHEST 2V
2 series · 2 of 2 positions shown · non-contrast
Comparison: 02/06/2008

CLINICAL DATA: Chest pain for 2 days.

EXAM:
CHEST  2 VIEW

[chest pa]
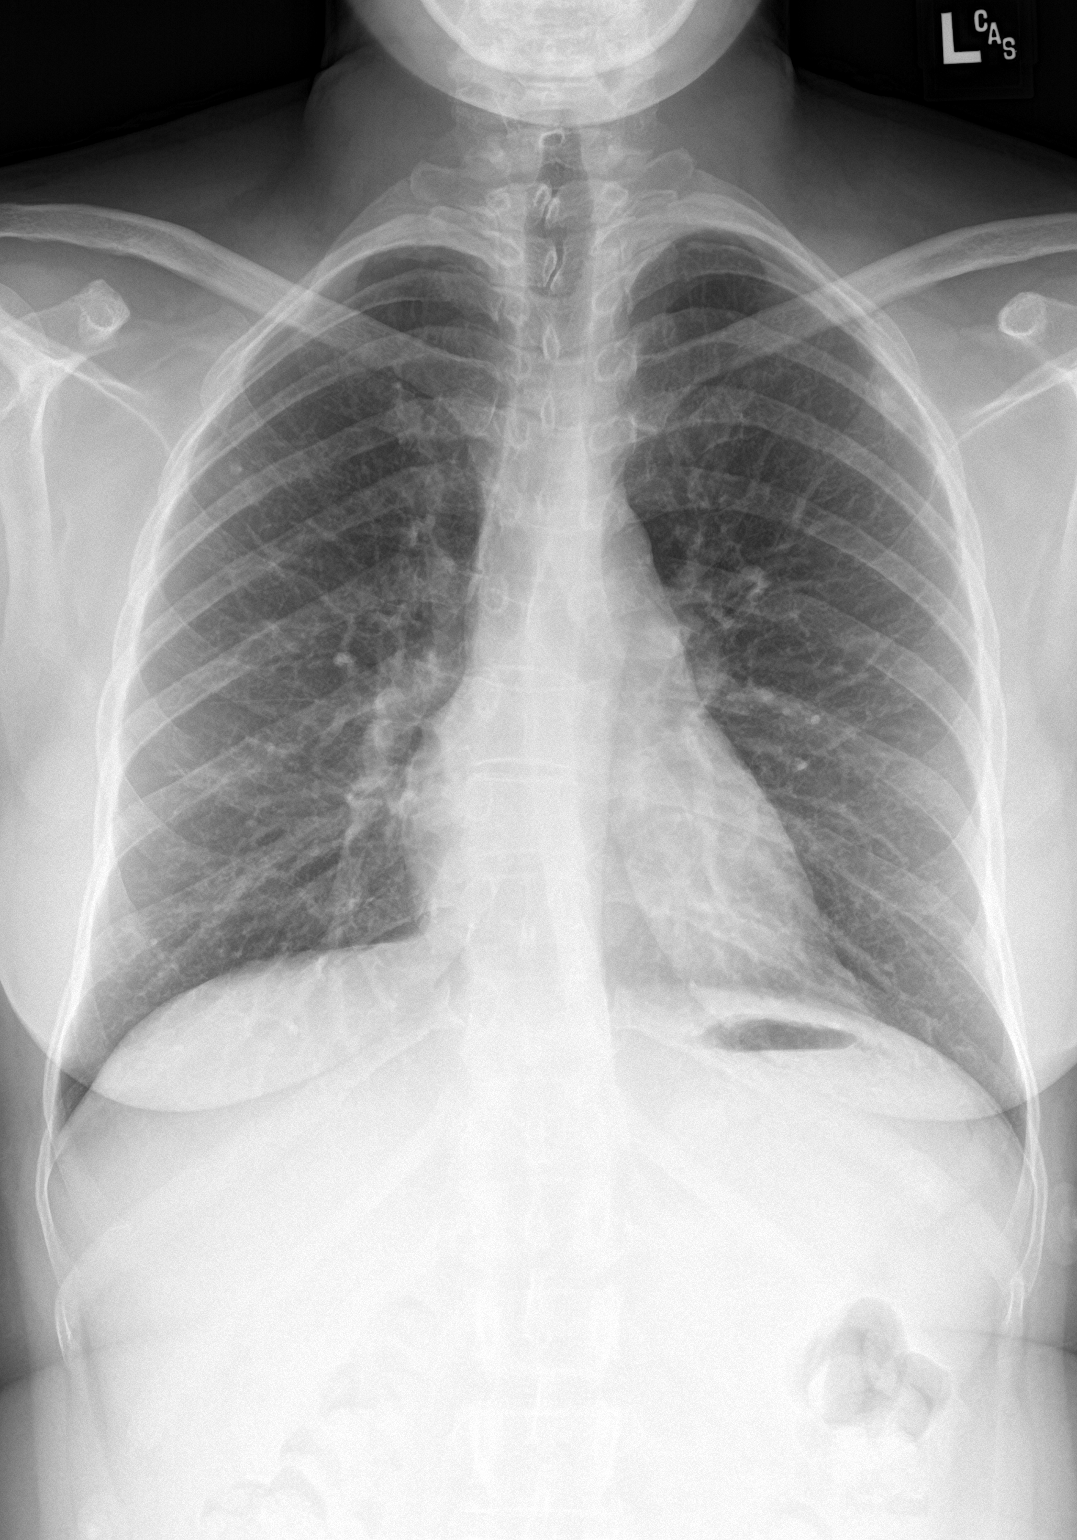

[chest lat]
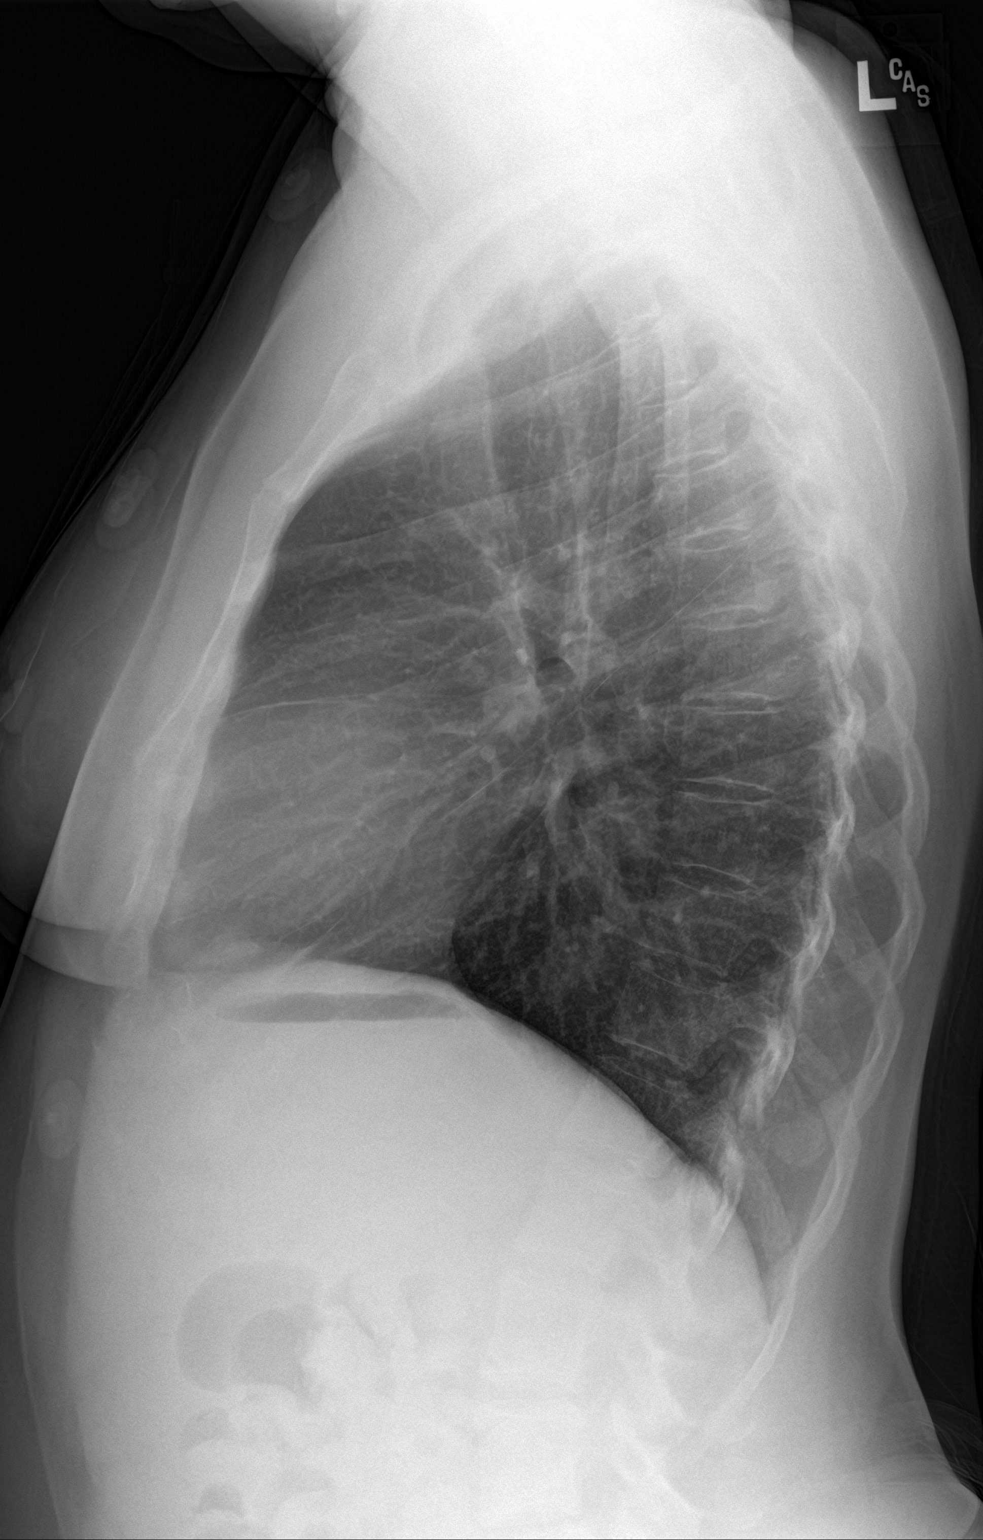

[2 of 2 positions shown; findings below may reference images not displayed]

FINDINGS: The heart size and mediastinal contours are within normal limits.
Both lungs are clear. The visualized skeletal structures are
unremarkable.
IMPRESSION: No active cardiopulmonary disease.

## 2017-03-29 MED FILL — LEVOTHYROXINE 75 MCG TABLET: 75 | 90 days supply | Qty: 90 | Fill #0

## 2017-04-08 DIAGNOSIS — H52223 Regular astigmatism, bilateral: Secondary | ICD-10-CM | POA: Diagnosis not present

## 2017-04-12 MED FILL — PANTOPRAZOLE SOD DR 40 MG T: 40 | 90 days supply | Qty: 180 | Fill #1

## 2017-04-16 ENCOUNTER — Encounter: Payer: Self-pay | Admitting: Internal Medicine

## 2017-04-16 ENCOUNTER — Other Ambulatory Visit (INDEPENDENT_AMBULATORY_CARE_PROVIDER_SITE_OTHER): Payer: 59

## 2017-04-16 ENCOUNTER — Ambulatory Visit (INDEPENDENT_AMBULATORY_CARE_PROVIDER_SITE_OTHER): Payer: 59 | Admitting: Internal Medicine

## 2017-04-16 VITALS — BP 128/70 | HR 65 | Temp 98.2°F | Resp 16 | Ht 60.0 in | Wt 144.0 lb

## 2017-04-16 DIAGNOSIS — E039 Hypothyroidism, unspecified: Secondary | ICD-10-CM

## 2017-04-16 DIAGNOSIS — R002 Palpitations: Secondary | ICD-10-CM | POA: Diagnosis not present

## 2017-04-16 DIAGNOSIS — Z0000 Encounter for general adult medical examination without abnormal findings: Secondary | ICD-10-CM

## 2017-04-16 LAB — CBC WITH DIFFERENTIAL/PLATELET
BASOS PCT: 0.5 % (ref 0.0–3.0)
Basophils Absolute: 0 10*3/uL (ref 0.0–0.1)
EOS PCT: 3.2 % (ref 0.0–5.0)
Eosinophils Absolute: 0.2 10*3/uL (ref 0.0–0.7)
HCT: 44.2 % (ref 36.0–46.0)
Hemoglobin: 14.3 g/dL (ref 12.0–15.0)
LYMPHS ABS: 2.7 10*3/uL (ref 0.7–4.0)
Lymphocytes Relative: 37.5 % (ref 12.0–46.0)
MCHC: 32.4 g/dL (ref 30.0–36.0)
MCV: 93.8 fl (ref 78.0–100.0)
MONO ABS: 0.4 10*3/uL (ref 0.1–1.0)
Monocytes Relative: 6.2 % (ref 3.0–12.0)
NEUTROS ABS: 3.7 10*3/uL (ref 1.4–7.7)
NEUTROS PCT: 52.6 % (ref 43.0–77.0)
PLATELETS: 416 10*3/uL — AB (ref 150.0–400.0)
RBC: 4.72 Mil/uL (ref 3.87–5.11)
RDW: 13.1 % (ref 11.5–15.5)
WBC: 7.1 10*3/uL (ref 4.0–10.5)

## 2017-04-16 LAB — LIPID PANEL
CHOL/HDL RATIO: 3
CHOLESTEROL: 223 mg/dL — AB (ref 0–200)
HDL: 73.6 mg/dL (ref >39.00)
LDL CALC: 121 mg/dL — AB (ref 0–99)
NonHDL: 149.57
Triglycerides: 143 mg/dL (ref 0.0–149.0)
VLDL: 28.6 mg/dL (ref 0.0–40.0)

## 2017-04-16 LAB — COMPREHENSIVE METABOLIC PANEL
ALT: 12 U/L (ref 0–35)
AST: 14 U/L (ref 0–37)
Albumin: 4.2 g/dL (ref 3.5–5.2)
Alkaline Phosphatase: 62 U/L (ref 39–117)
BUN: 7 mg/dL (ref 6–23)
CHLORIDE: 101 meq/L (ref 96–112)
CO2: 28 mEq/L (ref 19–32)
CREATININE: 0.95 mg/dL (ref 0.40–1.20)
Calcium: 9.5 mg/dL (ref 8.4–10.5)
GFR: 79.48 mL/min (ref >60.00)
GLUCOSE: 89 mg/dL (ref 70–99)
POTASSIUM: 3.8 meq/L (ref 3.5–5.1)
SODIUM: 136 meq/L (ref 135–145)
Total Bilirubin: 0.3 mg/dL (ref 0.2–1.2)
Total Protein: 7.9 g/dL (ref 6.0–8.3)

## 2017-04-16 LAB — TSH: TSH: 5.8 u[IU]/mL — AB (ref 0.35–4.50)

## 2017-04-16 MED FILL — ESTRADIOL 2 MG TABLET: 2 | 30 days supply | Qty: 30 | Fill #5

## 2017-04-16 NOTE — Progress Notes (Signed)
Subjective:  Patient ID: Chelsey Hunt, female    DOB: October 12, 1965  Age: 51 y.o. MRN: [id number]  CC: Hypothyroidism   HPI Chelsey Hunt presents for f/up - She complains of a three-week history of an occasional skipped heartbeat with dizziness. She also complains of fatigue, weight gain, and constipation. The constipation is adequately treated with MiraLAX. The palpitations are brief and are not associated with near syncope, lightheadedness, chest pain, or shortness of breath.  Outpatient Medications Prior to Visit  Medication Sig Dispense Refill  . cetirizine (ZYRTEC) 10 MG tablet 1 tablet as needed for allergies     . dicyclomine (BENTYL) 10 MG capsule Take 10 mg by mouth.    . pantoprazole (PROTONIX) 40 MG tablet Take 40 mg by mouth 2 (two) times daily.    . Riboflavin (B-2) 100 MG TABS Take 1 tablet by mouth daily.      . rizatriptan (MAXALT) 10 MG tablet Take 10 mg by mouth as needed for migraine. May repeat in 2 hours if needed    . levothyroxine (SYNTHROID, LEVOTHROID) 75 MCG tablet Take 1 tablet (75 mcg total) by mouth daily. Annual appt w/labs due in Oct must see provider for future refills 90 tablet 0  . Coenzyme Q10 (COQ10) 100 MG CAPS Take 100 capsules by mouth 2 (two) times daily.    Marland Kitchen estradiol (ESTRACE) 0.5 MG tablet   12  . estradiol (ESTRACE) 1 MG tablet      No facility-administered medications prior to visit.     ROS Review of Systems  Constitutional: Positive for fatigue and unexpected weight change. Negative for appetite change, chills, diaphoresis and fever.  HENT: Negative.   Eyes: Negative for visual disturbance.  Respiratory: Negative.  Negative for cough, chest tightness, shortness of breath and wheezing.   Cardiovascular: Positive for palpitations. Negative for chest pain and leg swelling.  Gastrointestinal: Positive for constipation. Negative for abdominal distention, abdominal pain, diarrhea and nausea.  Endocrine: Negative.  Negative for cold  intolerance and heat intolerance.  Genitourinary: Negative.  Negative for difficulty urinating.  Musculoskeletal: Negative.  Negative for back pain and myalgias.  Skin: Negative.  Negative for color change and pallor.  Neurological: Positive for dizziness. Negative for syncope, weakness, light-headedness and headaches.  Hematological: Negative for adenopathy. Does not bruise/bleed easily.  Psychiatric/Behavioral: Negative.     Objective:  BP 128/70 (BP Location: Left Arm, Patient Position: Sitting, Cuff Size: Normal)   Pulse 65   Temp 98.2 F (36.8 C) (Oral)   Resp 16   Ht 5' (1.524 m)   Wt 144 lb (65.3 kg)   LMP 04/20/2013   SpO2 100%   BMI 28.12 kg/m   BP Readings from Last 3 Encounters:  04/16/17 128/70  03/17/17 118/72  01/02/17 127/74    Wt Readings from Last 3 Encounters:  04/16/17 144 lb (65.3 kg)  01/02/17 143 lb (64.9 kg)  08/29/16 138 lb (62.6 kg)    Physical Exam  Constitutional: She is oriented to person, place, and time. No distress.  HENT:  Mouth/Throat: Oropharynx is clear and moist. No oropharyngeal exudate.  Eyes: Conjunctivae are normal. Right eye exhibits no discharge. Left eye exhibits no discharge. No scleral icterus.  Neck: Normal range of motion. Neck supple. No JVD present. No thyromegaly present.  Cardiovascular: Normal rate, regular rhythm, S1 normal, S2 normal and normal heart sounds.  Exam reveals no gallop and no friction rub.   No murmur heard. EKG -  Sinus  Rhythm  WITHIN NORMAL  LIMITS- no change from prior EKG   Pulmonary/Chest: Effort normal and breath sounds normal. No respiratory distress. She has no wheezes. She has no rales. She exhibits no tenderness.  Abdominal: Soft. Bowel sounds are normal. She exhibits no distension and no mass. There is no tenderness. There is no rebound and no guarding.  Musculoskeletal: Normal range of motion. She exhibits no edema, tenderness or deformity.  Lymphadenopathy:    She has no cervical  adenopathy.  Neurological: She is alert and oriented to person, place, and time.  Skin: Skin is warm and dry. No rash noted. She is not diaphoretic. No erythema. No pallor.  Vitals reviewed.   Lab Results  Component Value Date   WBC 7.1 04/16/2017   HGB 14.3 04/16/2017   HCT 44.2 04/16/2017   PLT 416.0 (H) 04/16/2017   GLUCOSE 89 04/16/2017   CHOL 223 (H) 04/16/2017   TRIG 143.0 04/16/2017   HDL 73.60 04/16/2017   LDLDIRECT 133.5 10/23/2013   LDLCALC 121 (H) 04/16/2017   ALT 12 04/16/2017   AST 14 04/16/2017   NA 136 04/16/2017   K 3.8 04/16/2017   CL 101 04/16/2017   CREATININE 0.95 04/16/2017   BUN 7 04/16/2017   CO2 28 04/16/2017   TSH 5.80 (H) 04/16/2017   HGBA1C 5.7 06/13/2016    No results found.  Assessment & Plan:   Chelsey Hunt was seen today for hypothyroidism.  Diagnoses and all orders for this visit:  Acquired hypothyroidism- she is symptomatic and has a mildly elevated TSH so I recommended an increase in her T4 dose. -     TSH; Future -     levothyroxine (SYNTHROID, LEVOTHROID) 100 MCG tablet; Take 1 tablet (100 mcg total) by mouth daily.  Routine general medical examination at a health care facility- exam completed, labs reviewed, vaccines reviewed, screening for colon cancer is up-to-date, her mammogram and Pap smear up-to-date, patient education material was given. -     Lipid panel; Future -     Comprehensive metabolic panel; Future -     CBC with Differential/Platelet; Future  Palpitations- her EKG is normal and the description of the palpitations is benign and possibly related to hypothyroidism. Will increase her T4 dose and if the palpitations persist or worsen then I will consider doing a Holter monitor. -     EKG 12-Lead   I have discontinued Chelsey Hunt's CoQ10 and levothyroxine. I am also having her start on levothyroxine. Additionally, I am having her maintain her cetirizine, B-2, pantoprazole, rizatriptan, dicyclomine, and estradiol.  Meds  ordered this encounter  Medications  . estradiol (ESTRACE) 2 MG tablet    Sig: estradiol 2 mg tablet  . levothyroxine (SYNTHROID, LEVOTHROID) 100 MCG tablet    Sig: Take 1 tablet (100 mcg total) by mouth daily.    Dispense:  90 tablet    Refill:  1     Follow-up: Return in about 6 weeks (around 05/28/2017).  [name], MD

## 2017-04-16 NOTE — Patient Instructions (Signed)
Palpitations A palpitation is the feeling that your heartbeat is irregular or is faster than normal. It may feel like your heart is fluttering or skipping a beat. Palpitations are usually not a serious problem. They may be caused by many things, including smoking, caffeine, alcohol, stress, and certain medicines. Although most causes of palpitations are not serious, palpitations can be a sign of a serious medical problem. In some cases, you may need further medical evaluation. Follow these instructions at home: Pay attention to any changes in your symptoms. Take these actions to help with your condition:  Avoid the following: ? Caffeinated coffee, tea, soft drinks, diet pills, and energy drinks. ? Chocolate. ? Alcohol.  Do not use any tobacco products, such as cigarettes, chewing tobacco, and e-cigarettes. If you need help quitting, ask your health care provider.  Try to reduce your stress and anxiety. Things that can help you relax include: ? Yoga. ? Meditation. ? Physical activity, such as swimming, jogging, or walking. ? Biofeedback. This is a method that helps you learn to use your mind to control things in your body, such as your heartbeats.  Get plenty of rest and sleep.  Take over-the-counter and prescription medicines only as told by your health care provider.  Keep all follow-up visits as told by your health care provider. This is important.  Contact a health care provider if:  You continue to have a fast or irregular heartbeat after 24 hours.  Your palpitations occur more often. Get help right away if:  You have chest pain or shortness of breath.  You have a severe headache.  You feel dizzy or you faint. This information is not intended to replace advice given to you by your health care provider. Make sure you discuss any questions you have with your health care provider. Document Released: 08/24/2000 Document Revised: 01/30/2016 Document Reviewed: 05/12/2015 Elsevier  Interactive Patient Education  2017 Elsevier Inc.  

## 2017-04-17 ENCOUNTER — Encounter: Payer: Self-pay | Admitting: Internal Medicine

## 2017-04-17 MED ORDER — LEVOTHYROXINE SODIUM 100 MCG PO TABS: 100.0000 ug | ORAL_TABLET | Freq: Every day | ORAL | 1 refills | Status: DC

## 2017-04-17 MED FILL — LEVOTHYROXINE 100 MCG TABLE: 100 | 90 days supply | Qty: 90 | Fill #0

## 2017-04-18 ENCOUNTER — Other Ambulatory Visit: Payer: Self-pay | Admitting: Internal Medicine

## 2017-04-18 ENCOUNTER — Encounter: Payer: Self-pay | Admitting: Internal Medicine

## 2017-04-18 DIAGNOSIS — E039 Hypothyroidism, unspecified: Secondary | ICD-10-CM

## 2017-04-18 MED ORDER — LEVOTHYROXINE SODIUM 88 MCG PO TABS
88.0000 ug | ORAL_TABLET | Freq: Every day | ORAL | 1 refills | Status: DC
Start: 2017-04-18 — End: 2017-10-14

## 2017-04-18 MED FILL — LEVOTHYROXINE 88 MCG TABLET: 88 | 90 days supply | Qty: 90 | Fill #0

## 2017-04-25 DIAGNOSIS — N644 Mastodynia: Secondary | ICD-10-CM | POA: Diagnosis not present

## 2017-04-25 DIAGNOSIS — N6320 Unspecified lump in the left breast, unspecified quadrant: Secondary | ICD-10-CM | POA: Diagnosis not present

## 2017-05-01 DIAGNOSIS — N644 Mastodynia: Secondary | ICD-10-CM | POA: Diagnosis not present

## 2017-05-08 ENCOUNTER — Ambulatory Visit (INDEPENDENT_AMBULATORY_CARE_PROVIDER_SITE_OTHER): Payer: 59 | Admitting: Internal Medicine

## 2017-05-08 ENCOUNTER — Encounter: Payer: Self-pay | Admitting: Internal Medicine

## 2017-05-08 VITALS — BP 128/88 | HR 71 | Temp 98.2°F | Resp 16 | Ht 60.0 in | Wt 141.8 lb

## 2017-05-08 DIAGNOSIS — E039 Hypothyroidism, unspecified: Secondary | ICD-10-CM

## 2017-05-08 DIAGNOSIS — G43001 Migraine without aura, not intractable, with status migrainosus: Secondary | ICD-10-CM

## 2017-05-08 NOTE — Progress Notes (Signed)
FMLA paperwork received today.

## 2017-05-08 NOTE — Patient Instructions (Signed)
Migraine Headache A migraine headache is an intense, throbbing pain on one side or both sides of the head. Migraines may also cause other symptoms, such as nausea, vomiting, and sensitivity to light and noise. What are the causes? Doing or taking certain things may also trigger migraines, such as:  Alcohol.  Smoking.  Medicines, such as: ? Medicine used to treat chest pain (nitroglycerine). ? Birth control pills. ? Estrogen pills. ? Certain blood pressure medicines.  Aged cheeses, chocolate, or caffeine.  Foods or drinks that contain nitrates, glutamate, aspartame, or tyramine.  Physical activity.  Other things that may trigger a migraine include:  Menstruation.  Pregnancy.  Hunger.  Stress, lack of sleep, too much sleep, or fatigue.  Weather changes.  What increases the risk? The following factors may make you more likely to experience migraine headaches:  Age. Risk increases with age.  Family history of migraine headaches.  Being Caucasian.  Depression and anxiety.  Obesity.  Being a woman.  Having a hole in the heart (patent foramen ovale) or other heart problems.  What are the signs or symptoms? The main symptom of this condition is pulsating or throbbing pain. Pain may:  Happen in any area of the head, such as on one side or both sides.  Interfere with daily activities.  Get worse with physical activity.  Get worse with exposure to bright lights or loud noises.  Other symptoms may include:  Nausea.  Vomiting.  Dizziness.  General sensitivity to bright lights, loud noises, or smells.  Before you get a migraine, you may get warning signs that a migraine is developing (aura). An aura may include:  Seeing flashing lights or having blind spots.  Seeing bright spots, halos, or zigzag lines.  Having tunnel vision or blurred vision.  Having numbness or a tingling feeling.  Having trouble talking.  Having muscle weakness.  How is this  diagnosed? A migraine headache can be diagnosed based on:  Your symptoms.  A physical exam.  Tests, such as CT scan or MRI of the head. These imaging tests can help rule out other causes of headaches.  Taking fluid from the spine (lumbar puncture) and analyzing it (cerebrospinal fluid analysis, or CSF analysis).  How is this treated? A migraine headache is usually treated with medicines that:  Relieve pain.  Relieve nausea.  Prevent migraines from coming back.  Treatment may also include:  Acupuncture.  Lifestyle changes like avoiding foods that trigger migraines.  Follow these instructions at home: Medicines  Take over-the-counter and prescription medicines only as told by your health care provider.  Do not drive or use heavy machinery while taking prescription pain medicine.  To prevent or treat constipation while you are taking prescription pain medicine, your health care provider may recommend that you: ? Drink enough fluid to keep your urine clear or pale yellow. ? Take over-the-counter or prescription medicines. ? Eat foods that are high in fiber, such as fresh fruits and vegetables, whole grains, and beans. ? Limit foods that are high in fat and processed sugars, such as fried and sweet foods. Lifestyle  Avoid alcohol use.  Do not use any products that contain nicotine or tobacco, such as cigarettes and e-cigarettes. If you need help quitting, ask your health care provider.  Get at least 8 hours of sleep every night.  Limit your stress. General instructions   Keep a journal to find out what may trigger your migraine headaches. For example, write down: ? What you eat and   drink. ? How much sleep you get. ? Any change to your diet or medicines.  If you have a migraine: ? Avoid things that make your symptoms worse, such as bright lights. ? It may help to lie down in a dark, quiet room. ? Do not drive or use heavy machinery. ? Ask your health care provider  what activities are safe for you while you are experiencing symptoms.  Keep all follow-up visits as told by your health care provider. This is important. Contact a health care provider if:  You develop symptoms that are different or more severe than your usual migraine symptoms. Get help right away if:  Your migraine becomes severe.  You have a fever.  You have a stiff neck.  You have vision loss.  Your muscles feel weak or like you cannot control them.  You start to lose your balance often.  You develop trouble walking.  You faint. This information is not intended to replace advice given to you by your health care provider. Make sure you discuss any questions you have with your health care provider. Document Released: 08/27/2005 Document Revised: 03/16/2016 Document Reviewed: 02/13/2016 Elsevier Interactive Patient Education  2017 Elsevier Inc.   

## 2017-05-08 NOTE — Progress Notes (Signed)
Subjective:  Patient ID: Chelsey Hunt, female    DOB: Jan 15, 1966  Age: 51 y.o. MRN: [id number]  CC: Headache and Hypothyroidism   HPI Chelsey Hunt presents for f/up - She tells me that her neurologist is retiring and she wants to know if I will take over prescribing her meds for migraine management. She tells me her last headache was about a week ago and it was her usual migraine and it responded well to Maxalt. She has otherwise felt well recently and offers no other complaints.  Outpatient Medications Prior to Visit  Medication Sig Dispense Refill  . cetirizine (ZYRTEC) 10 MG tablet 1 tablet as needed for allergies     . dicyclomine (BENTYL) 10 MG capsule Take 10 mg by mouth.    . estradiol (ESTRACE) 2 MG tablet estradiol 2 mg tablet    . levothyroxine (SYNTHROID, LEVOTHROID) 88 MCG tablet Take 1 tablet (88 mcg total) by mouth daily. 90 tablet 1  . pantoprazole (PROTONIX) 40 MG tablet Take 40 mg by mouth 2 (two) times daily.    . Riboflavin (B-2) 100 MG TABS Take 1 tablet by mouth daily.      . rizatriptan (MAXALT) 10 MG tablet Take 10 mg by mouth as needed for migraine. May repeat in 2 hours if needed     No facility-administered medications prior to visit.     ROS Review of Systems  Constitutional: Negative for diaphoresis, fatigue and unexpected weight change.  HENT: Negative.   Eyes: Negative.  Negative for visual disturbance.  Respiratory: Negative.  Negative for cough, chest tightness, shortness of breath and wheezing.   Cardiovascular: Negative for chest pain and leg swelling.  Gastrointestinal: Negative for abdominal pain, constipation, diarrhea, nausea and vomiting.  Endocrine: Negative.  Negative for cold intolerance and heat intolerance.  Genitourinary: Negative.   Musculoskeletal: Negative for back pain and myalgias.  Skin: Negative.  Negative for color change and pallor.  Allergic/Immunologic: Negative.   Neurological: Negative.  Negative for dizziness,  weakness, light-headedness and headaches.  Hematological: Negative.  Negative for adenopathy. Does not bruise/bleed easily.  Psychiatric/Behavioral: Negative.     Objective:  BP 128/88 (BP Location: Left Arm, Patient Position: Sitting, Cuff Size: Normal)   Pulse 71   Temp 98.2 F (36.8 C) (Oral)   Resp 16   Ht 5' (1.524 m)   Wt 141 lb 12 oz (64.3 kg)   LMP 04/20/2013   SpO2 98%   BMI 27.68 kg/m   BP Readings from Last 3 Encounters:  05/08/17 128/88  04/16/17 128/70  03/17/17 118/72    Wt Readings from Last 3 Encounters:  05/08/17 141 lb 12 oz (64.3 kg)  04/16/17 144 lb (65.3 kg)  01/02/17 143 lb (64.9 kg)    Physical Exam  Constitutional: She is oriented to person, place, and time. No distress.  HENT:  Mouth/Throat: Oropharynx is clear and moist. No oropharyngeal exudate.  Eyes: Conjunctivae are normal. Right eye exhibits no discharge. Left eye exhibits no discharge. No scleral icterus.  Neck: Normal range of motion. Neck supple. No JVD present. No thyromegaly present.  Cardiovascular: Normal rate, regular rhythm and intact distal pulses.  Exam reveals no gallop and no friction rub.   No murmur heard. Pulmonary/Chest: Effort normal and breath sounds normal. No respiratory distress. She has no wheezes. She has no rales. She exhibits no tenderness.  Abdominal: Soft. Bowel sounds are normal. She exhibits no distension and no mass. There is no tenderness. There is no rebound and no  guarding.  Musculoskeletal: Normal range of motion. She exhibits no edema, tenderness or deformity.  Lymphadenopathy:    She has no cervical adenopathy.  Neurological: She is alert and oriented to person, place, and time.  Skin: Skin is warm and dry. No rash noted. She is not diaphoretic. No erythema. No pallor.  Vitals reviewed.   Lab Results  Component Value Date   WBC 7.1 04/16/2017   HGB 14.3 04/16/2017   HCT 44.2 04/16/2017   PLT 416.0 (H) 04/16/2017   GLUCOSE 89 04/16/2017   CHOL  223 (H) 04/16/2017   TRIG 143.0 04/16/2017   HDL 73.60 04/16/2017   LDLDIRECT 133.5 10/23/2013   LDLCALC 121 (H) 04/16/2017   ALT 12 04/16/2017   AST 14 04/16/2017   NA 136 04/16/2017   K 3.8 04/16/2017   CL 101 04/16/2017   CREATININE 0.95 04/16/2017   BUN 7 04/16/2017   CO2 28 04/16/2017   TSH 5.80 (H) 04/16/2017   HGBA1C 5.7 06/13/2016    No results found.  Assessment & Plan:   Chelsey Hunt was seen today for headache and hypothyroidism.  Diagnoses and all orders for this visit:  Migraine without aura and with status migrainosus, not intractable- will cont the QD B2 supplement and the triptan as needed  Acquired hypothyroidism- her recent TSH was mildly elevated, her T4 was increased, she is euthyroid today, will recheck her TSH in about 2-3 months   I am having Chelsey Hunt maintain her cetirizine, B-2, pantoprazole, rizatriptan, dicyclomine, estradiol, and levothyroxine.  No orders of the defined types were placed in this encounter.    Follow-up: Return in about 3 months (around 08/08/2017).  [name], MD

## 2017-05-09 DIAGNOSIS — Z0279 Encounter for issue of other medical certificate: Secondary | ICD-10-CM

## 2017-05-17 MED FILL — ESTRADIOL 2 MG TABLET: 2 | 30 days supply | Qty: 30 | Fill #6

## 2017-05-30 DIAGNOSIS — N644 Mastodynia: Secondary | ICD-10-CM | POA: Diagnosis not present

## 2017-06-04 ENCOUNTER — Ambulatory Visit: Payer: 59 | Admitting: Internal Medicine

## 2017-06-11 MED FILL — PROMETHAZINE 25 MG TABLET: 25 | 2 days supply | Qty: 15 | Fill #0

## 2017-06-11 MED FILL — CYCLOBENZAPRINE 10 MG TAB: 10 | 30 days supply | Qty: 30 | Fill #0

## 2017-06-18 MED FILL — ESTRADIOL 2 MG TABLET: 2 | 30 days supply | Qty: 30 | Fill #7

## 2017-07-02 ENCOUNTER — Encounter: Payer: Self-pay | Admitting: Family Medicine

## 2017-07-02 ENCOUNTER — Ambulatory Visit (INDEPENDENT_AMBULATORY_CARE_PROVIDER_SITE_OTHER): Payer: 59 | Admitting: Family Medicine

## 2017-07-02 ENCOUNTER — Other Ambulatory Visit (INDEPENDENT_AMBULATORY_CARE_PROVIDER_SITE_OTHER): Payer: 59

## 2017-07-02 VITALS — BP 124/66 | HR 72 | Temp 98.1°F | Ht 62.0 in | Wt 146.0 lb

## 2017-07-02 DIAGNOSIS — E039 Hypothyroidism, unspecified: Secondary | ICD-10-CM | POA: Diagnosis not present

## 2017-07-02 LAB — TSH: TSH: 1 u[IU]/mL (ref 0.35–4.50)

## 2017-07-02 LAB — T4, FREE: FREE T4: 1.16 ng/dL (ref 0.60–1.60)

## 2017-07-02 NOTE — Patient Instructions (Signed)
Thank you for coming in,   We will call you with the results from today.    Please feel free to call with any questions or concerns at any time, at [phone]. --Dr. Lancer Thurner  

## 2017-07-02 NOTE — Progress Notes (Signed)
Chelsey Hunt - 51 y.o. female MRN [id number]  Date of birth: 1966/05/23  SUBJECTIVE:  Including CC & ROS.  Chief Complaint  Patient presents with  . Fatigued    present for 4 days-She states her her Synthroid dosage was changed in August and has not feeling good.   Marland Kitchen Headache    present since Saturday    Chelsey Hunt is a 51 y.o. female that is  presenting with malaise and headache. She's had recent thyroid issues and has had her medication change. She has not started the higher dose of the medication yet. Her feelings admitting acute and started about the past 4-5 days. Having headache in frontal region as well as the occipital region. Unsure if this is related to her regular migraine or thyroid issues. Not taken any of her any of her migraine medication. Eyes any fevers or chills.  Review of her blood work from 8/7 shows an elevated TSH of 5.8, normal kidney function and normal electrolytes.  Review of her ferritin from 08/25/14 shows 18.4.   Review of Systems  Constitutional: Negative for fever.  Skin: Negative for color change.  Neurological: Positive for headaches. Negative for facial asymmetry and weakness.    HISTORY: Past Medical, Surgical, Social, and Family History Reviewed & Updated per EMR.   Pertinent Historical Findings include:  Past Medical History:  Diagnosis Date  . ACID REFLUX DISEASE 01/03/2008  . Anemia   . ANEMIA, IRON DEFICIENCY 12/23/2008   history  . Chronic tension type headache 01/13/2009   migraines  . Complication of anesthesia    pt states she just needs a small amount or she will sleep too long  . Dysplasia of cervix, low grade (CIN 1)   . Fibroid   . GERD (gastroesophageal reflux disease)   . HEART MURMUR, SYSTOLIC 40/98/1191   will have echo 06/18/2013  . 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 Irritable bowel syndrome 12/23/2008  . Migraine   . Ovarian cyst   . Ovarian cyst   . THYROID NODULE, RIGHT 04/21/2009    Past Surgical  History:  Procedure Laterality Date  . COLONOSCOPY    . HYSTEROSCOPY    . LAPAROSCOPY    . NOVASURE ABLATION    . POLYPECTOMY    . TUBAL LIGATION    . UPPER GI ENDOSCOPY    . VAGINAL HYSTERECTOMY N/A 06/24/2013   Procedure: TOTAL HYSTERECTOMY VAGINAL;  Surgeon: Alwyn Pea, MD;  Location: [facility];  Service: Gynecology;  Laterality: N/A;    Allergies  Allergen Reactions  . Propoxyphene N-Acetaminophen   . Nabumetone Rash    REACTION: GI upset  . Topiramate Rash    REACTION: rash    Family History  Problem Relation Age of Onset  . Goiter Mother   . Heart disease Mother   . Thyroid disease Mother   . Graves' disease Sister   . Asthma Sister   . Cancer Father   . Heart disease Father   . Goiter Other        Siblings 2  . Hypertension Paternal Grandfather   . Depression Neg Hx   . Alcohol abuse Neg Hx   . Drug abuse Neg Hx   . Early death Neg Hx   . Hearing loss Neg Hx   . Kidney disease Neg Hx   . Stroke Neg Hx      Social History   Social History  . Marital status: Married    Spouse name: N/A  . Number of  children: N/A  . Years of education: N/A   Occupational History  . Not on file.   Social History Main Topics  . Smoking status: Never Smoker  . Smokeless tobacco: Never Used  . Alcohol use No     Comment: occ  . Drug use: No  . Sexual activity: Yes    Birth control/ protection: Surgical     Comment: BTL   Other Topics Concern  . Not on file   Social History Narrative   Regular exercise-yes     PHYSICAL EXAM:  VS: BP 124/66 (BP Location: Left Arm, Patient Position: Sitting, Cuff Size: Normal)   Pulse 72   Temp 98.1 F (36.7 C) (Oral)   Ht 5\' 2"  (1.575 m)   Wt 146 lb (66.2 kg)   LMP 04/20/2013   SpO2 99%   BMI 26.70 kg/m  Physical Exam Gen: NAD, alert, cooperative with exam,  ENT: normal lips, normal nasal mucosa, Eye: normal EOM, normal conjunctiva and lids, Pupils are equal and reactive  CV:  no edema, +2 pedal pulses , S1-S2,  regular rate and rhythm  Resp: no accessory muscle use, non-labored, clear to auscultation bilaterally, no crackles or wheezes Skin: no rashes, no areas of induration  Neuro: normal tone, normal sensation to touch, cranial nerves II through XII intact, normal strength in upper and lower extremities bilaterally, normal sensation, neurovascular intact Psych:  normal insight, alert and oriented MSK: Normal gait, normal strength      ASSESSMENT & PLAN:   Hypothyroidism Unclear if her symptoms they are associated with her thyroid or if it is a consequence of her migraines. - TSH and free T4 today. - May need to take her medication for migraines if her thyroid is normal.

## 2017-07-02 NOTE — Assessment & Plan Note (Signed)
Unclear if her symptoms they are associated with her thyroid or if it is a consequence of her migraines. - TSH and free T4 today. - May need to take her medication for migraines if her thyroid is normal.

## 2017-07-15 MED FILL — LEVOTHYROXINE 88 MCG TABLET: 88 | 90 days supply | Qty: 90 | Fill #1

## 2017-07-15 MED FILL — RIZATRIPTAN 10 MG TABLET: 10 | 30 days supply | Qty: 9 | Fill #0

## 2017-07-19 MED FILL — ESTRADIOL 2 MG TABLET: 2 | 30 days supply | Qty: 30 | Fill #8

## 2017-08-08 ENCOUNTER — Encounter: Payer: Self-pay | Admitting: Internal Medicine

## 2017-08-08 ENCOUNTER — Ambulatory Visit (INDEPENDENT_AMBULATORY_CARE_PROVIDER_SITE_OTHER): Payer: 59 | Admitting: Internal Medicine

## 2017-08-08 VITALS — BP 112/84 | HR 74 | Temp 97.7°F | Resp 16 | Ht 62.0 in | Wt 149.5 lb

## 2017-08-08 DIAGNOSIS — L609 Nail disorder, unspecified: Secondary | ICD-10-CM | POA: Insufficient documentation

## 2017-08-08 DIAGNOSIS — J301 Allergic rhinitis due to pollen: Secondary | ICD-10-CM | POA: Insufficient documentation

## 2017-08-08 DIAGNOSIS — H6982 Other specified disorders of Eustachian tube, left ear: Secondary | ICD-10-CM

## 2017-08-08 DIAGNOSIS — B351 Tinea unguium: Secondary | ICD-10-CM | POA: Diagnosis not present

## 2017-08-08 MED ORDER — METHYLPREDNISOLONE 4 MG PO TBPK: ORAL_TABLET | ORAL | 0 refills | Status: DC

## 2017-08-08 MED ORDER — EFINACONAZOLE 10 % EX SOLN: 1.0000 | Freq: Every day | CUTANEOUS | 1 refills | Status: DC

## 2017-08-08 MED FILL — METHYLPREDNISOLONE 4 MG TAB: 4 | 6 days supply | Qty: 21 | Fill #0

## 2017-08-08 NOTE — Progress Notes (Signed)
Subjective:  Patient ID: Chelsey Hunt, female    DOB: 13-Dec-1965  Age: 51 y.o. MRN: [id number]  CC: Allergic Rhinitis    HPI Chelsey Hunt presents for several concerns.  She complains of a several week history of nasal congestion, runny nose, and popping/crackling in her right ear.  She has not gotten much symptom relief with Zyrtec.  She refuses to use a nasal spray.  She complains of an abnormal appearance on her left great toe and wants to be treated for onychomycosis.  Also, she had a headache earlier today but took a dose of Maxalt and says the headache has resolved.  She offers no other complaints today.  Outpatient Medications Prior to Visit  Medication Sig Dispense Refill  . cetirizine (ZYRTEC) 10 MG tablet 1 tablet as needed for allergies     . estradiol (ESTRACE) 2 MG tablet estradiol 2 mg tablet    . levothyroxine (SYNTHROID, LEVOTHROID) 88 MCG tablet Take 1 tablet (88 mcg total) by mouth daily. 90 tablet 1  . pantoprazole (PROTONIX) 40 MG tablet Take 40 mg by mouth 2 (two) times daily.    . rizatriptan (MAXALT) 10 MG tablet Take 10 mg by mouth as needed for migraine. May repeat in 2 hours if needed     No facility-administered medications prior to visit.     ROS Review of Systems  Constitutional: Negative.  Negative for appetite change and fatigue.  HENT: Positive for congestion, postnasal drip and rhinorrhea. Negative for ear discharge, facial swelling, hearing loss, sinus pressure, sinus pain, sneezing, sore throat and trouble swallowing.   Eyes: Negative.   Respiratory: Negative.  Negative for cough.   Cardiovascular: Negative.  Negative for chest pain.  Gastrointestinal: Negative.  Negative for abdominal pain, diarrhea, nausea and vomiting.  Endocrine: Negative.   Genitourinary: Negative.  Negative for difficulty urinating.  Musculoskeletal: Negative.  Negative for myalgias and neck pain.  Skin: Negative.  Negative for color change and rash.    Allergic/Immunologic: Negative.   Neurological: Positive for headaches. Negative for dizziness, weakness, light-headedness and numbness.  Hematological: Negative for adenopathy. Does not bruise/bleed easily.  Psychiatric/Behavioral: Negative.     Objective:  BP 112/84 (BP Location: Left Arm, Patient Position: Sitting, Cuff Size: Normal)   Pulse 74   Temp 97.7 F (36.5 C) (Oral)   Resp 16   Ht 5\' 2"  (1.575 m)   Wt 149 lb 8 oz (67.8 kg)   LMP 04/20/2013   SpO2 99%   BMI 27.34 kg/m   BP Readings from Last 3 Encounters:  08/08/17 112/84  07/02/17 124/66  05/08/17 128/88    Wt Readings from Last 3 Encounters:  08/08/17 149 lb 8 oz (67.8 kg)  07/02/17 146 lb (66.2 kg)  05/08/17 141 lb 12 oz (64.3 kg)    Physical Exam  Constitutional: She is oriented to person, place, and time. No distress.  HENT:  Right Ear: Hearing, tympanic membrane, external ear and ear canal normal.  Left Ear: Hearing, tympanic membrane, external ear and ear canal normal.  Nose: Mucosal edema present. No rhinorrhea or sinus tenderness. No epistaxis.  No foreign bodies. Right sinus exhibits no maxillary sinus tenderness and no frontal sinus tenderness. Left sinus exhibits no maxillary sinus tenderness and no frontal sinus tenderness.  Mouth/Throat: Oropharynx is clear and moist and mucous membranes are normal. Mucous membranes are not pale, not dry and not cyanotic. No oral lesions. No trismus in the jaw. No uvula swelling. No oropharyngeal exudate, posterior oropharyngeal edema,  posterior oropharyngeal erythema or tonsillar abscesses.  Eyes: Conjunctivae are normal. Right eye exhibits no discharge. Left eye exhibits no discharge. No scleral icterus.  Neck: Normal range of motion. Neck supple. No JVD present. No thyromegaly present.  Cardiovascular: Normal rate, regular rhythm and intact distal pulses. Exam reveals no gallop.  No murmur heard. Pulmonary/Chest: Effort normal and breath sounds normal. She has  no wheezes. She has no rales.  Abdominal: Soft. Bowel sounds are normal.  Neurological: She is alert and oriented to person, place, and time.  Skin: Skin is warm and dry. No rash noted. She is not diaphoretic. No erythema. No pallor.  Left great toe.  Diffusely across the base there is subungual thickening and debris.  The nail plate is intact. There is no surrounding exudate, erythema, or tenderness.  Vitals reviewed.   Lab Results  Component Value Date   WBC 7.1 04/16/2017   HGB 14.3 04/16/2017   HCT 44.2 04/16/2017   PLT 416.0 (H) 04/16/2017   GLUCOSE 89 04/16/2017   CHOL 223 (H) 04/16/2017   TRIG 143.0 04/16/2017   HDL 73.60 04/16/2017   LDLDIRECT 133.5 10/23/2013   LDLCALC 121 (H) 04/16/2017   ALT 12 04/16/2017   AST 14 04/16/2017   NA 136 04/16/2017   K 3.8 04/16/2017   CL 101 04/16/2017   CREATININE 0.95 04/16/2017   BUN 7 04/16/2017   CO2 28 04/16/2017   TSH 1.00 07/02/2017   HGBA1C 5.7 06/13/2016    No results found.  Assessment & Plan:   Chelsey Hunt was seen today for allergic rhinitis .  Diagnoses and all orders for this visit:  Onychomycosis of left great toe -     Efinaconazole 10 % SOLN; Apply 1 Act topically daily.  Seasonal allergic rhinitis due to pollen- she is having a moderately severe flare up with right eustachian tube dysfunction.  Will treat with a course of systemic steroids. -     methylPREDNISolone (MEDROL DOSEPAK) 4 MG TBPK tablet; TAKE AS DIRECTED  Dysfunction of left eustachian tube -     methylPREDNISolone (MEDROL DOSEPAK) 4 MG TBPK tablet; TAKE AS DIRECTED   I am having Chelsey Hunt start on methylPREDNISolone and Efinaconazole. I am also having her maintain her cetirizine, pantoprazole, rizatriptan, estradiol, and levothyroxine.  Meds ordered this encounter  Medications  . methylPREDNISolone (MEDROL DOSEPAK) 4 MG TBPK tablet    Sig: TAKE AS DIRECTED    Dispense:  21 tablet    Refill:  0  . Efinaconazole 10 % SOLN    Sig:  Apply 1 Act topically daily.    Dispense:  8 mL    Refill:  1     Follow-up: Return if symptoms worsen or fail to improve.  Chelsey Calico, MD

## 2017-08-08 NOTE — Patient Instructions (Signed)
Allergic Rhinitis Allergic rhinitis is when the mucous membranes in the nose respond to allergens. Allergens are particles in the air that cause your body to have an allergic reaction. This causes you to release allergic antibodies. Through a chain of events, these eventually cause you to release histamine into the blood stream. Although meant to protect the body, it is this release of histamine that causes your discomfort, such as frequent sneezing, congestion, and an itchy, runny nose. What are the causes? Seasonal allergic rhinitis (hay fever) is caused by pollen allergens that may come from grasses, trees, and weeds. Year-round allergic rhinitis (perennial allergic rhinitis) is caused by allergens such as house dust mites, pet dander, and mold spores. What are the signs or symptoms?  Nasal stuffiness (congestion).  Itchy, runny nose with sneezing and tearing of the eyes. How is this diagnosed? Your health care provider can help you determine the allergen or allergens that trigger your symptoms. If you and your health care provider are unable to determine the allergen, skin or blood testing may be used. Your health care provider will diagnose your condition after taking your health history and performing a physical exam. Your health care provider may assess you for other related conditions, such as asthma, pink eye, or an ear infection. How is this treated? Allergic rhinitis does not have a cure, but it can be controlled by:  Medicines that block allergy symptoms. These may include allergy shots, nasal sprays, and oral antihistamines.  Avoiding the allergen. Hay fever may often be treated with antihistamines in pill or nasal spray forms. Antihistamines block the effects of histamine. There are over-the-counter medicines that may help with nasal congestion and swelling around the eyes. Check with your health care provider before taking or giving this medicine. If avoiding the allergen or the  medicine prescribed do not work, there are many new medicines your health care provider can prescribe. Stronger medicine may be used if initial measures are ineffective. Desensitizing injections can be used if medicine and avoidance does not work. Desensitization is when a patient is given ongoing shots until the body becomes less sensitive to the allergen. Make sure you follow up with your health care provider if problems continue. Follow these instructions at home: It is not possible to completely avoid allergens, but you can reduce your symptoms by taking steps to limit your exposure to them. It helps to know exactly what you are allergic to so that you can avoid your specific triggers. Contact a health care provider if:  You have a fever.  You develop a cough that does not stop easily (persistent).  You have shortness of breath.  You start wheezing.  Symptoms interfere with normal daily activities. This information is not intended to replace advice given to you by your health care provider. Make sure you discuss any questions you have with your health care provider. Document Released: 05/22/2001 Document Revised: 04/27/2016 Document Reviewed: 05/04/2013 Elsevier Interactive Patient Education  2017 Elsevier Inc.  

## 2017-08-12 ENCOUNTER — Telehealth: Payer: Self-pay | Admitting: Internal Medicine

## 2017-08-12 NOTE — Telephone Encounter (Signed)
Please advise 

## 2017-08-12 NOTE — Telephone Encounter (Signed)
Copied from [place] [id number]. Topic: General - Other >> Aug 12, 2017  2:46 PM Yvette Rack wrote: Reason for CRM:  pt would like prednisone nose spray instead of the pills that was given to her yesterday please send to [facility]

## 2017-08-12 NOTE — Telephone Encounter (Signed)
See pt request 

## 2017-08-13 ENCOUNTER — Other Ambulatory Visit: Payer: Self-pay | Admitting: Internal Medicine

## 2017-08-13 DIAGNOSIS — K581 Irritable bowel syndrome with constipation: Secondary | ICD-10-CM | POA: Diagnosis not present

## 2017-08-13 DIAGNOSIS — Z5181 Encounter for therapeutic drug level monitoring: Secondary | ICD-10-CM | POA: Diagnosis not present

## 2017-08-13 DIAGNOSIS — R1011 Right upper quadrant pain: Secondary | ICD-10-CM | POA: Diagnosis not present

## 2017-08-13 DIAGNOSIS — Z79899 Other long term (current) drug therapy: Secondary | ICD-10-CM | POA: Diagnosis not present

## 2017-08-13 DIAGNOSIS — K219 Gastro-esophageal reflux disease without esophagitis: Secondary | ICD-10-CM | POA: Diagnosis not present

## 2017-08-13 DIAGNOSIS — J301 Allergic rhinitis due to pollen: Secondary | ICD-10-CM

## 2017-08-13 MED ORDER — FLUTICASONE PROPIONATE 50 MCG/ACT NA SUSP: 2.0000 | Freq: Every day | NASAL | 1 refills | Status: DC

## 2017-08-13 MED FILL — PANTOPRAZOLE SOD DR 40 MG T: 40 | 90 days supply | Qty: 180 | Fill #0

## 2017-08-13 MED FILL — DICYCLOMINE 10 MG CAPSULE: 10 | 30 days supply | Qty: 90 | Fill #0

## 2017-08-13 MED FILL — FLUTICASONE PROP 50 MCG SPR: 50 | 90 days supply | Qty: 48 | Fill #0

## 2017-08-13 NOTE — Telephone Encounter (Signed)
Prescription sent

## 2017-08-13 NOTE — Telephone Encounter (Signed)
Pt is requesting prednisone dose pack (tablets) to be changed to the nose. Please advise if change can be done. Will inform pt of same.

## 2017-08-14 NOTE — Telephone Encounter (Signed)
Pt informed new rx was sent.  

## 2017-08-16 DIAGNOSIS — Z79899 Other long term (current) drug therapy: Secondary | ICD-10-CM | POA: Diagnosis not present

## 2017-08-16 DIAGNOSIS — Z5181 Encounter for therapeutic drug level monitoring: Secondary | ICD-10-CM | POA: Diagnosis not present

## 2017-08-16 DIAGNOSIS — K219 Gastro-esophageal reflux disease without esophagitis: Secondary | ICD-10-CM | POA: Diagnosis not present

## 2017-08-20 MED FILL — ESTRADIOL 2 MG TABLET: 2 | 30 days supply | Qty: 30 | Fill #9

## 2017-08-23 ENCOUNTER — Telehealth: Payer: Self-pay

## 2017-08-23 ENCOUNTER — Other Ambulatory Visit: Payer: Self-pay | Admitting: Internal Medicine

## 2017-08-23 DIAGNOSIS — B351 Tinea unguium: Secondary | ICD-10-CM

## 2017-08-23 MED ORDER — CICLOPIROX 0.77 % EX GEL: 1.0000 | Freq: Two times a day (BID) | CUTANEOUS | 1 refills | Status: AC

## 2017-08-23 MED FILL — CICLOPIROX 0.77 % GEL: 0.77 | 15 days supply | Qty: 45 | Fill #0

## 2017-08-23 NOTE — Telephone Encounter (Signed)
changed

## 2017-08-23 NOTE — Telephone Encounter (Signed)
Pt informed new rx was sent in.

## 2017-08-23 NOTE — Telephone Encounter (Signed)
Started PA for [employer].   Generic alternatives are: ciclopirox or terbinafine HCL.  Is there in clinical reason that these may not be used?

## 2017-08-26 DIAGNOSIS — Z01419 Encounter for gynecological examination (general) (routine) without abnormal findings: Secondary | ICD-10-CM | POA: Diagnosis not present

## 2017-08-26 DIAGNOSIS — Z6827 Body mass index (BMI) 27.0-27.9, adult: Secondary | ICD-10-CM | POA: Diagnosis not present

## 2017-08-26 DIAGNOSIS — Z1231 Encounter for screening mammogram for malignant neoplasm of breast: Secondary | ICD-10-CM | POA: Diagnosis not present

## 2017-08-26 DIAGNOSIS — R3914 Feeling of incomplete bladder emptying: Secondary | ICD-10-CM | POA: Diagnosis not present

## 2017-08-26 DIAGNOSIS — N951 Menopausal and female climacteric states: Secondary | ICD-10-CM | POA: Diagnosis not present

## 2017-08-26 LAB — HM PAP SMEAR

## 2017-08-27 ENCOUNTER — Telehealth: Payer: Self-pay | Admitting: Internal Medicine

## 2017-08-27 DIAGNOSIS — Z1231 Encounter for screening mammogram for malignant neoplasm of breast: Secondary | ICD-10-CM | POA: Diagnosis not present

## 2017-08-27 NOTE — Telephone Encounter (Signed)
Pt has question about cicopirox gel;she wants to know where to apply this medication; she states that it is for her toe nail and she is not sure where to put the medication; conference call intitiated with Sam at [facility]; she clarifies to use a pea sized amount to the toe nail and the toe area; pt verbalizes understanding.

## 2017-08-28 LAB — HM MAMMOGRAPHY: HM Mammogram: NORMAL (ref 0–4)

## 2017-08-29 ENCOUNTER — Encounter: Payer: Self-pay | Admitting: Internal Medicine

## 2017-09-17 MED FILL — ESTRADIOL 2 MG TABLET: 2 | 30 days supply | Qty: 30 | Fill #10

## 2017-09-28 ENCOUNTER — Ambulatory Visit: Payer: 59 | Admitting: Family Medicine

## 2017-09-28 ENCOUNTER — Encounter: Payer: Self-pay | Admitting: Family Medicine

## 2017-09-28 VITALS — BP 110/68 | HR 81 | Temp 97.9°F | Ht 62.0 in | Wt 146.0 lb

## 2017-09-28 DIAGNOSIS — L509 Urticaria, unspecified: Secondary | ICD-10-CM

## 2017-09-28 NOTE — Patient Instructions (Signed)
Take your zyrtec once daily. You may take 25 mg of generic over the counter benadryl at bedtime as needed for itching. Take the predpack you have at home.

## 2017-09-28 NOTE — Progress Notes (Signed)
OFFICE VISIT  09/29/2017   CC:   RASH  HPI:    Patient is a 52 y.o. african [place] female who presents accompanied by her husband for rash. Onset about 4-5 d/a on back of R shoulder, felt like it was a mole that was itching--but no mole is present there.  She started scratching it a lot. She applied triamcinolone cream and it didn't help.  Tingling, burning, itching.  Tingling was felt prior to onset of rash. No blisters.  Over the last 4d the area involved has not spread much at all. No fevers.  Mild nausea recently.  No vomiting or diarrhea. She has not taken any antihistamines.  She has a predpack at home rx'd for a diff problem in the past but has not taken any for this.  Past Medical History:  Diagnosis Date  . ACID REFLUX DISEASE 01/03/2008  . Anemia   . ANEMIA, IRON DEFICIENCY 12/23/2008   history  . Chronic tension type headache 01/13/2009   migraines  . Complication of anesthesia    pt states she just needs a small amount or she will sleep too long  . Dysplasia of cervix, low grade (CIN 1)   . Fibroid   . GERD (gastroesophageal reflux disease)   . HEART MURMUR, SYSTOLIC 42/70/6237   will have echo 06/18/2013  . 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 Irritable bowel syndrome 12/23/2008  . Migraine   . Ovarian cyst   . Ovarian cyst   . THYROID NODULE, RIGHT 04/21/2009    Past Surgical History:  Procedure Laterality Date  . COLONOSCOPY    . HYSTEROSCOPY    . LAPAROSCOPY    . NOVASURE ABLATION    . POLYPECTOMY    . TUBAL LIGATION    . UPPER GI ENDOSCOPY    . VAGINAL HYSTERECTOMY N/A 06/24/2013   Procedure: TOTAL HYSTERECTOMY VAGINAL;  Surgeon: Alwyn Pea, MD;  Location: [facility];  Service: Gynecology;  Laterality: N/A;    Outpatient Medications Prior to Visit  Medication Sig Dispense Refill  . cetirizine (ZYRTEC) 10 MG tablet 1 tablet as needed for allergies     . Ciclopirox 0.77 % gel Apply 1 Act topically 2 (two) times daily. 45 g 1  . estradiol  (ESTRACE) 2 MG tablet estradiol 2 mg tablet    . fluticasone (FLONASE) 50 MCG/ACT nasal spray Place 2 sprays into both nostrils daily. 48 g 1  . levothyroxine (SYNTHROID, LEVOTHROID) 88 MCG tablet Take 1 tablet (88 mcg total) by mouth daily. 90 tablet 1  . pantoprazole (PROTONIX) 40 MG tablet Take 40 mg by mouth 2 (two) times daily.    . rizatriptan (MAXALT) 10 MG tablet Take 10 mg by mouth as needed for migraine. May repeat in 2 hours if needed    . methylPREDNISolone (MEDROL DOSEPAK) 4 MG TBPK tablet TAKE AS DIRECTED (Patient not taking: Reported on 09/28/2017) 21 tablet 0   No facility-administered medications prior to visit.     Allergies  Allergen Reactions  . Propoxyphene N-Acetaminophen   . Nabumetone Rash    REACTION: GI upset  . Topiramate Rash    REACTION: rash    ROS As per HPI  PE: Blood pressure 110/68, pulse 81, temperature 97.9 F (36.6 C), temperature source Oral, height 5\' 2"  (1.575 m), weight 146 lb (66.2 kg), last menstrual period 04/20/2013, SpO2 98 %. Gen: Alert, well appearing.  Patient is oriented to person, place, time, and situation. AFFECT: pleasant, lucid thought and speech. Upper right back (  suprascapular area) with faintly erythematous confluence of urticaria, minimally palpable, borders not very well demarcated.  No tenderness to touch.  No vesicles, pustules, induration, or ulceration.  No streaking.  Mild warmth to the area of rash.  LABS:    Chemistry      Component Value Date/Time   NA 136 04/16/2017 1657   K 3.8 04/16/2017 1657   CL 101 04/16/2017 1657   CO2 28 04/16/2017 1657   BUN 7 04/16/2017 1657   CREATININE 0.95 04/16/2017 1657      Component Value Date/Time   CALCIUM 9.5 04/16/2017 1657   ALKPHOS 62 04/16/2017 1657   AST 14 04/16/2017 1657   ALT 12 04/16/2017 1657   BILITOT 0.3 04/16/2017 1657       IMPRESSION AND PLAN:  Urticarial rash, no progression since onset and no sign of angioedema. No contact allergen or irritant  or new food identified. Treat with zyrtec 10mg  qd, add 25mg  benadryl qhs prn. Take predpack --she has this at home already that she has not opened.  An After Visit Summary was printed and given to the patient.  FOLLOW UP: Return if symptoms worsen or fail to improve.  Signed:  Crissie Sickles, MD           09/29/2017

## 2017-09-30 DIAGNOSIS — D2262 Melanocytic nevi of left upper limb, including shoulder: Secondary | ICD-10-CM | POA: Diagnosis not present

## 2017-09-30 DIAGNOSIS — D2261 Melanocytic nevi of right upper limb, including shoulder: Secondary | ICD-10-CM | POA: Diagnosis not present

## 2017-09-30 DIAGNOSIS — L245 Irritant contact dermatitis due to other chemical products: Secondary | ICD-10-CM | POA: Diagnosis not present

## 2017-09-30 DIAGNOSIS — L819 Disorder of pigmentation, unspecified: Secondary | ICD-10-CM | POA: Diagnosis not present

## 2017-09-30 DIAGNOSIS — D2371 Other benign neoplasm of skin of right lower limb, including hip: Secondary | ICD-10-CM | POA: Diagnosis not present

## 2017-09-30 DIAGNOSIS — L821 Other seborrheic keratosis: Secondary | ICD-10-CM | POA: Diagnosis not present

## 2017-09-30 MED FILL — TRIAMCINOLONE 0.1% CREAM: 0.1 | 14 days supply | Qty: 80 | Fill #0

## 2017-10-08 ENCOUNTER — Ambulatory Visit: Payer: 59 | Admitting: Internal Medicine

## 2017-10-08 ENCOUNTER — Encounter: Payer: Self-pay | Admitting: Internal Medicine

## 2017-10-08 VITALS — BP 120/80 | HR 89 | Temp 99.8°F | Resp 16 | Ht 62.0 in | Wt 143.8 lb

## 2017-10-08 DIAGNOSIS — J069 Acute upper respiratory infection, unspecified: Secondary | ICD-10-CM

## 2017-10-08 DIAGNOSIS — J301 Allergic rhinitis due to pollen: Secondary | ICD-10-CM | POA: Diagnosis not present

## 2017-10-08 DIAGNOSIS — B9789 Other viral agents as the cause of diseases classified elsewhere: Secondary | ICD-10-CM

## 2017-10-08 DIAGNOSIS — J0100 Acute maxillary sinusitis, unspecified: Secondary | ICD-10-CM | POA: Insufficient documentation

## 2017-10-08 MED ORDER — CETIRIZINE-PSEUDOEPHEDRINE ER 5-120 MG PO TB12: 1.0000 | ORAL_TABLET | Freq: Two times a day (BID) | ORAL | 1 refills | Status: DC

## 2017-10-08 MED ORDER — HYDROCODONE-HOMATROPINE 5-1.5 MG/5ML PO SYRP: 5.0000 mL | ORAL_SOLUTION | Freq: Three times a day (TID) | ORAL | 0 refills | Status: DC | PRN

## 2017-10-08 MED FILL — HYDROCODONE-HOMATROPINE SYR: 5-1.5 | 8 days supply | Qty: 120 | Fill #0

## 2017-10-08 NOTE — Patient Instructions (Signed)
Upper Respiratory Infection, Adult Most upper respiratory infections (URIs) are caused by a virus. A URI affects the nose, throat, and upper air passages. The most common type of URI is often called "the common cold." Follow these instructions at home:  Take medicines only as told by your doctor.  Gargle warm saltwater or take cough drops to comfort your throat as told by your doctor.  Use a warm mist humidifier or inhale steam from a shower to increase air moisture. This may make it easier to breathe.  Drink enough fluid to keep your pee (urine) clear or pale yellow.  Eat soups and other clear broths.  Have a healthy diet.  Rest as needed.  Go back to work when your fever is gone or your doctor says it is okay. ? You may need to stay home longer to avoid giving your URI to others. ? You can also wear a face mask and wash your hands often to prevent spread of the virus.  Use your inhaler more if you have asthma.  Do not use any tobacco products, including cigarettes, chewing tobacco, or electronic cigarettes. If you need help quitting, ask your doctor. Contact a doctor if:  You are getting worse, not better.  Your symptoms are not helped by medicine.  You have chills.  You are getting more short of breath.  You have brown or red mucus.  You have yellow or brown discharge from your nose.  You have pain in your face, especially when you bend forward.  You have a fever.  You have puffy (swollen) neck glands.  You have pain while swallowing.  You have white areas in the back of your throat. Get help right away if:  You have very bad or constant: ? Headache. ? Ear pain. ? Pain in your forehead, behind your eyes, and over your cheekbones (sinus pain). ? Chest pain.  You have long-lasting (chronic) lung disease and any of the following: ? Wheezing. ? Long-lasting cough. ? Coughing up blood. ? A change in your usual mucus.  You have a stiff neck.  You have  changes in your: ? Vision. ? Hearing. ? Thinking. ? Mood. This information is not intended to replace advice given to you by your health care provider. Make sure you discuss any questions you have with your health care provider. Document Released: 02/13/2008 Document Revised: 04/29/2016 Document Reviewed: 12/02/2013 Elsevier Interactive Patient Education  2018 Elsevier Inc.  

## 2017-10-09 NOTE — Progress Notes (Signed)
Subjective:  Patient ID: Chelsey Hunt, female    DOB: 08-08-1966  Age: 52 y.o. MRN: [id number]  CC: URI   HPI [name] presents for a 3-day history of low-grade fever, chills, sneezing, nonproductive cough, and facial pressure.  Outpatient Medications Prior to Visit  Medication Sig Dispense Refill  . cetirizine (ZYRTEC) 10 MG tablet 1 tablet as needed for allergies     . Ciclopirox 0.77 % gel Apply 1 Act topically 2 (two) times daily. 45 g 1  . estradiol (ESTRACE) 2 MG tablet estradiol 2 mg tablet    . fluticasone (FLONASE) 50 MCG/ACT nasal spray Place 2 sprays into both nostrils daily. 48 g 1  . levothyroxine (SYNTHROID, LEVOTHROID) 88 MCG tablet Take 1 tablet (88 mcg total) by mouth daily. 90 tablet 1  . pantoprazole (PROTONIX) 40 MG tablet Take 40 mg by mouth 2 (two) times daily.    . rizatriptan (MAXALT) 10 MG tablet Take 10 mg by mouth as needed for migraine. May repeat in 2 hours if needed    . methylPREDNISolone (MEDROL DOSEPAK) 4 MG TBPK tablet TAKE AS DIRECTED (Patient not taking: Reported on 09/28/2017) 21 tablet 0   No facility-administered medications prior to visit.     ROS Review of Systems  Constitutional: Positive for chills and fever. Negative for diaphoresis and fatigue.  HENT: Positive for congestion, postnasal drip, rhinorrhea, sinus pressure, sinus pain and sneezing. Negative for facial swelling and sore throat.   Respiratory: Positive for cough. Negative for chest tightness, shortness of breath and wheezing.   Cardiovascular: Negative.  Negative for chest pain, palpitations and leg swelling.  Gastrointestinal: Negative.  Negative for abdominal pain, constipation, diarrhea and vomiting.  Endocrine: Negative.   Genitourinary: Negative.  Negative for difficulty urinating.  Musculoskeletal: Negative.   Skin: Negative.   Allergic/Immunologic: Negative.   Neurological: Negative.  Negative for dizziness and weakness.  Hematological: Negative for  adenopathy. Does not bruise/bleed easily.  Psychiatric/Behavioral: Negative.     Objective:  BP 120/80 (BP Location: Left Arm, Patient Position: Sitting, Cuff Size: Normal)   Pulse 89   Temp 99.8 F (37.7 C) (Oral)   Resp 16   Ht 5\' 2"  (1.575 m)   Wt 143 lb 12 oz (65.2 kg)   LMP 04/20/2013   SpO2 100%   BMI 26.29 kg/m   BP Readings from Last 3 Encounters:  10/08/17 120/80  09/28/17 110/68  08/08/17 112/84    Wt Readings from Last 3 Encounters:  10/08/17 143 lb 12 oz (65.2 kg)  09/28/17 146 lb (66.2 kg)  08/08/17 149 lb 8 oz (67.8 kg)    Physical Exam  Constitutional: She is oriented to person, place, and time.  Non-toxic appearance. She does not have a sickly appearance. She does not appear ill. No distress.  HENT:  Right Ear: Hearing, tympanic membrane, external ear and ear canal normal.  Left Ear: Hearing, tympanic membrane, external ear and ear canal normal.  Nose: Mucosal edema present. No rhinorrhea. Right sinus exhibits no maxillary sinus tenderness and no frontal sinus tenderness. Left sinus exhibits no maxillary sinus tenderness and no frontal sinus tenderness.  Mouth/Throat: No oral lesions. No trismus in the jaw. No uvula swelling. No oropharyngeal exudate or posterior oropharyngeal erythema.  Eyes: Conjunctivae are normal. Left eye exhibits no discharge. No scleral icterus.  Neck: Normal range of motion. Neck supple. No JVD present. No thyromegaly present.  Cardiovascular: Normal rate, regular rhythm and normal heart sounds. Exam reveals no gallop.  No murmur  heard. Pulmonary/Chest: Effort normal and breath sounds normal. No respiratory distress. She has no wheezes. She has no rales.  Abdominal: Soft. Bowel sounds are normal. She exhibits no distension and no mass.  Musculoskeletal: Normal range of motion. She exhibits no edema or tenderness.  Lymphadenopathy:    She has no cervical adenopathy.  Neurological: She is alert and oriented to person, place, and  time.  Skin: Skin is warm and dry. No rash noted. She is not diaphoretic. No erythema. No pallor.  Vitals reviewed.   Lab Results  Component Value Date   WBC 7.1 04/16/2017   HGB 14.3 04/16/2017   HCT 44.2 04/16/2017   PLT 416.0 (H) 04/16/2017   GLUCOSE 89 04/16/2017   CHOL 223 (H) 04/16/2017   TRIG 143.0 04/16/2017   HDL 73.60 04/16/2017   LDLDIRECT 133.5 10/23/2013   LDLCALC 121 (H) 04/16/2017   ALT 12 04/16/2017   AST 14 04/16/2017   NA 136 04/16/2017   K 3.8 04/16/2017   CL 101 04/16/2017   CREATININE 0.95 04/16/2017   BUN 7 04/16/2017   CO2 28 04/16/2017   TSH 1.00 07/02/2017   HGBA1C 5.7 06/13/2016    No results found.  Assessment & Plan:   Oma was seen today for uri.  Diagnoses and all orders for this visit:  Viral URI with cough -     HYDROcodone-homatropine (HYCODAN) 5-1.5 MG/5ML syrup; Take 5 mLs by mouth every 8 (eight) hours as needed for cough. -     cetirizine-pseudoephedrine (ZYRTEC-D ALLERGY & CONGESTION) 5-120 MG tablet; Take 1 tablet by mouth 2 (two) times daily.  Seasonal allergic rhinitis due to pollen -     cetirizine-pseudoephedrine (ZYRTEC-D ALLERGY & CONGESTION) 5-120 MG tablet; Take 1 tablet by mouth 2 (two) times daily.   I have discontinued Torsha Laurie's methylPREDNISolone. I am also having her start on HYDROcodone-homatropine and cetirizine-pseudoephedrine. Additionally, I am having her maintain her cetirizine, pantoprazole, rizatriptan, estradiol, levothyroxine, fluticasone, and Ciclopirox.  Meds ordered this encounter  Medications  . HYDROcodone-homatropine (HYCODAN) 5-1.5 MG/5ML syrup    Sig: Take 5 mLs by mouth every 8 (eight) hours as needed for cough.    Dispense:  120 mL    Refill:  0  . cetirizine-pseudoephedrine (ZYRTEC-D ALLERGY & CONGESTION) 5-120 MG tablet    Sig: Take 1 tablet by mouth 2 (two) times daily.    Dispense:  60 tablet    Refill:  1     Follow-up: Return in about 3 weeks (around  10/29/2017).  [name], MD

## 2017-10-14 ENCOUNTER — Other Ambulatory Visit: Payer: Self-pay | Admitting: Internal Medicine

## 2017-10-14 ENCOUNTER — Telehealth: Payer: Self-pay | Admitting: Internal Medicine

## 2017-10-14 DIAGNOSIS — E039 Hypothyroidism, unspecified: Secondary | ICD-10-CM

## 2017-10-14 DIAGNOSIS — J0100 Acute maxillary sinusitis, unspecified: Secondary | ICD-10-CM

## 2017-10-14 MED ORDER — AMOXICILLIN-POT CLAVULANATE 875-125 MG PO TABS: 1.0000 | ORAL_TABLET | Freq: Two times a day (BID) | ORAL | 0 refills | Status: DC

## 2017-10-14 MED FILL — AMOX-CLAV 875-125 MG TABLET: 875-125 | 10 days supply | Qty: 20 | Fill #0

## 2017-10-14 MED FILL — LEVOTHYROXINE 88 MCG TABLET: 88 | 90 days supply | Qty: 90 | Fill #0

## 2017-10-14 NOTE — Telephone Encounter (Signed)
Copied from [place] [id number]. Topic: General - Other >> Oct 14, 2017 10:21 AM Yvette Rack wrote: Reason for CRM: patient is calling stating that she need an antibiotic stating that her mucus is green and have eye pain and headaches pt would like to [place] the [place], [place] - [address]. [phone] (Phone) [phone] (Fax)    Patient was seen on 10/08/17, Please advise.

## 2017-10-14 NOTE — Telephone Encounter (Signed)
Pt informed rx has been sent.  

## 2017-10-14 NOTE — Telephone Encounter (Signed)
RX sent

## 2017-10-21 MED FILL — ESTRADIOL 2 MG TABLET: 2 | 30 days supply | Qty: 30 | Fill #11

## 2017-10-22 ENCOUNTER — Encounter: Payer: Self-pay | Admitting: Internal Medicine

## 2017-10-22 ENCOUNTER — Ambulatory Visit: Payer: 59 | Admitting: Internal Medicine

## 2017-10-22 VITALS — BP 110/60 | HR 95 | Temp 98.1°F | Resp 16 | Ht 62.0 in | Wt 145.1 lb

## 2017-10-22 DIAGNOSIS — G43001 Migraine without aura, not intractable, with status migrainosus: Secondary | ICD-10-CM | POA: Diagnosis not present

## 2017-10-22 MED ORDER — ERENUMAB-AOOE 70 MG/ML ~~LOC~~ SOAJ: 1.0000 | SUBCUTANEOUS | 1 refills | Status: DC

## 2017-10-22 NOTE — Patient Instructions (Signed)
Migraine Headache A migraine headache is an intense, throbbing pain on one side or both sides of the head. Migraines may also cause other symptoms, such as nausea, vomiting, and sensitivity to light and noise. What are the causes? Doing or taking certain things may also trigger migraines, such as:  Alcohol.  Smoking.  Medicines, such as: ? Medicine used to treat chest pain (nitroglycerine). ? Birth control pills. ? Estrogen pills. ? Certain blood pressure medicines.  Aged cheeses, chocolate, or caffeine.  Foods or drinks that contain nitrates, glutamate, aspartame, or tyramine.  Physical activity.  Other things that may trigger a migraine include:  Menstruation.  Pregnancy.  Hunger.  Stress, lack of sleep, too much sleep, or fatigue.  Weather changes.  What increases the risk? The following factors may make you more likely to experience migraine headaches:  Age. Risk increases with age.  Family history of migraine headaches.  Being Caucasian.  Depression and anxiety.  Obesity.  Being a woman.  Having a hole in the heart (patent foramen ovale) or other heart problems.  What are the signs or symptoms? The main symptom of this condition is pulsating or throbbing pain. Pain may:  Happen in any area of the head, such as on one side or both sides.  Interfere with daily activities.  Get worse with physical activity.  Get worse with exposure to bright lights or loud noises.  Other symptoms may include:  Nausea.  Vomiting.  Dizziness.  General sensitivity to bright lights, loud noises, or smells.  Before you get a migraine, you may get warning signs that a migraine is developing (aura). An aura may include:  Seeing flashing lights or having blind spots.  Seeing bright spots, halos, or zigzag lines.  Having tunnel vision or blurred vision.  Having numbness or a tingling feeling.  Having trouble talking.  Having muscle weakness.  How is this  diagnosed? A migraine headache can be diagnosed based on:  Your symptoms.  A physical exam.  Tests, such as CT scan or MRI of the head. These imaging tests can help rule out other causes of headaches.  Taking fluid from the spine (lumbar puncture) and analyzing it (cerebrospinal fluid analysis, or CSF analysis).  How is this treated? A migraine headache is usually treated with medicines that:  Relieve pain.  Relieve nausea.  Prevent migraines from coming back.  Treatment may also include:  Acupuncture.  Lifestyle changes like avoiding foods that trigger migraines.  Follow these instructions at home: Medicines  Take over-the-counter and prescription medicines only as told by your health care provider.  Do not drive or use heavy machinery while taking prescription pain medicine.  To prevent or treat constipation while you are taking prescription pain medicine, your health care provider may recommend that you: ? Drink enough fluid to keep your urine clear or pale yellow. ? Take over-the-counter or prescription medicines. ? Eat foods that are high in fiber, such as fresh fruits and vegetables, whole grains, and beans. ? Limit foods that are high in fat and processed sugars, such as fried and sweet foods. Lifestyle  Avoid alcohol use.  Do not use any products that contain nicotine or tobacco, such as cigarettes and e-cigarettes. If you need help quitting, ask your health care provider.  Get at least 8 hours of sleep every night.  Limit your stress. General instructions   Keep a journal to find out what may trigger your migraine headaches. For example, write down: ? What you eat and   drink. ? How much sleep you get. ? Any change to your diet or medicines.  If you have a migraine: ? Avoid things that make your symptoms worse, such as bright lights. ? It may help to lie down in a dark, quiet room. ? Do not drive or use heavy machinery. ? Ask your health care provider  what activities are safe for you while you are experiencing symptoms.  Keep all follow-up visits as told by your health care provider. This is important. Contact a health care provider if:  You develop symptoms that are different or more severe than your usual migraine symptoms. Get help right away if:  Your migraine becomes severe.  You have a fever.  You have a stiff neck.  You have vision loss.  Your muscles feel weak or like you cannot control them.  You start to lose your balance often.  You develop trouble walking.  You faint. This information is not intended to replace advice given to you by your health care provider. Make sure you discuss any questions you have with your health care provider. Document Released: 08/27/2005 Document Revised: 03/16/2016 Document Reviewed: 02/13/2016 Elsevier Interactive Patient Education  2017 Elsevier Inc.   

## 2017-10-22 NOTE — Progress Notes (Signed)
Subjective:  Patient ID: Chelsey Hunt, female    DOB: 1965-10-26  Age: 52 y.o. MRN: [id number]  CC: Headache   HPI Chelsey Hunt presents for follow-up on chronic headaches.  She said she has about 2 debilitating headaches a week.  She does get symptom relief with Maxalt.  She also occasionally takes Phenergan, Flexeril, and Tylenol.  Her last headache was about 4 days ago.  She woke up with a throbbing sensation.  Her headaches have not recently been more severe or changed in character.  She does have photophobia, phonophobia, and nausea with her headaches but she denies paresthesias.  Outpatient Medications Prior to Visit  Medication Sig Dispense Refill  . cetirizine-pseudoephedrine (ZYRTEC-D ALLERGY & CONGESTION) 5-120 MG tablet Take 1 tablet by mouth 2 (two) times daily. 60 tablet 1  . Ciclopirox 0.77 % gel Apply 1 Act topically 2 (two) times daily. 45 g 1  . estradiol (ESTRACE) 2 MG tablet estradiol 2 mg tablet    . fluticasone (FLONASE) 50 MCG/ACT nasal spray Place 2 sprays into both nostrils daily. 48 g 1  . levothyroxine (SYNTHROID, LEVOTHROID) 88 MCG tablet TAKE 1 TABLET (88 MCG TOTAL) BY MOUTH DAILY. 90 tablet 0  . pantoprazole (PROTONIX) 40 MG tablet Take 40 mg by mouth 2 (two) times daily.    . rizatriptan (MAXALT) 10 MG tablet Take 10 mg by mouth as needed for migraine. May repeat in 2 hours if needed    . amoxicillin-clavulanate (AUGMENTIN) 875-125 MG tablet Take 1 tablet by mouth 2 (two) times daily for 10 days. 20 tablet 0  . cetirizine (ZYRTEC) 10 MG tablet 1 tablet as needed for allergies     . HYDROcodone-homatropine (HYCODAN) 5-1.5 MG/5ML syrup Take 5 mLs by mouth every 8 (eight) hours as needed for cough. 120 mL 0   No facility-administered medications prior to visit.     ROS Review of Systems  Constitutional: Negative for diaphoresis and fatigue.  HENT: Negative.   Eyes: Negative for photophobia, pain and visual disturbance.  Respiratory: Negative.   Negative for cough, chest tightness, shortness of breath and wheezing.   Cardiovascular: Negative for chest pain, palpitations and leg swelling.  Gastrointestinal: Negative for abdominal pain, diarrhea and nausea.  Endocrine: Negative.   Genitourinary: Negative.  Negative for difficulty urinating.  Musculoskeletal: Negative.  Negative for gait problem.  Skin: Negative.   Neurological: Positive for headaches. Negative for dizziness, weakness, light-headedness and numbness.  Hematological: Does not bruise/bleed easily.  Psychiatric/Behavioral: Negative.     Objective:  BP 110/60 (BP Location: Right Arm, Patient Position: Sitting, Cuff Size: Normal)   Pulse 95   Temp 98.1 F (36.7 C) (Oral)   Resp 16   Ht 5\' 2"  (1.575 m)   Wt 145 lb 1.3 oz (65.8 kg)   LMP 04/20/2013   SpO2 98%   BMI 26.54 kg/m   BP Readings from Last 3 Encounters:  10/22/17 110/60  10/08/17 120/80  09/28/17 110/68    Wt Readings from Last 3 Encounters:  10/22/17 145 lb 1.3 oz (65.8 kg)  10/08/17 143 lb 12 oz (65.2 kg)  09/28/17 146 lb (66.2 kg)    Physical Exam  Constitutional: She is oriented to person, place, and time. No distress.  Eyes: Conjunctivae and EOM are normal. Pupils are equal, round, and reactive to light.  Neck: Normal range of motion. Neck supple. No JVD present. No thyromegaly present.  Cardiovascular: Normal rate, regular rhythm and normal heart sounds. Exam reveals no gallop.  No  murmur heard. Pulmonary/Chest: Effort normal and breath sounds normal. No respiratory distress. She has no wheezes. She has no rales.  Abdominal: Soft. Bowel sounds are normal. She exhibits no distension and no mass. There is no tenderness.  Musculoskeletal: Normal range of motion. She exhibits no edema, tenderness or deformity.  Lymphadenopathy:    She has no cervical adenopathy.  Neurological: She is alert and oriented to person, place, and time. She has normal reflexes. She displays normal reflexes. No  cranial nerve deficit. She exhibits normal muscle tone. Coordination normal.  Skin: Skin is warm and dry. No rash noted. She is not diaphoretic. No erythema. No pallor.  Psychiatric: She has a normal mood and affect. Her behavior is normal. Judgment and thought content normal.  Vitals reviewed.   Lab Results  Component Value Date   WBC 7.1 04/16/2017   HGB 14.3 04/16/2017   HCT 44.2 04/16/2017   PLT 416.0 (H) 04/16/2017   GLUCOSE 89 04/16/2017   CHOL 223 (H) 04/16/2017   TRIG 143.0 04/16/2017   HDL 73.60 04/16/2017   LDLDIRECT 133.5 10/23/2013   LDLCALC 121 (H) 04/16/2017   ALT 12 04/16/2017   AST 14 04/16/2017   NA 136 04/16/2017   K 3.8 04/16/2017   CL 101 04/16/2017   CREATININE 0.95 04/16/2017   BUN 7 04/16/2017   CO2 28 04/16/2017   TSH 1.00 07/02/2017   HGBA1C 5.7 06/13/2016    No results found.  Assessment & Plan:   Chelsey Hunt was seen today for headache.  Diagnoses and all orders for this visit:  Migraine without aura and with status migrainosus, not intractable- She has frequent headaches and a significant headache burden.  I have asked her to start using a CGRP antagonist.  She will continue to use Maxalt as needed. -     Erenumab-aooe (AIMOVIG) 70 MG/ML SOAJ; Inject 1 Act into the skin every 30 (thirty) days.   I have discontinued Chelsey Hunt's cetirizine, HYDROcodone-homatropine, and amoxicillin-clavulanate. I am also having her start on Erenumab-aooe. Additionally, I am having her maintain her pantoprazole, rizatriptan, estradiol, fluticasone, Ciclopirox, cetirizine-pseudoephedrine, and levothyroxine.  Meds ordered this encounter  Medications  . Erenumab-aooe (AIMOVIG) 70 MG/ML SOAJ    Sig: Inject 1 Act into the skin every 30 (thirty) days.    Dispense:  12 pen    Refill:  1     Follow-up: Return in about 3 months (around 01/19/2018).  Chelsey Calico, MD

## 2017-10-23 DIAGNOSIS — K581 Irritable bowel syndrome with constipation: Secondary | ICD-10-CM | POA: Diagnosis not present

## 2017-10-23 DIAGNOSIS — Z0279 Encounter for issue of other medical certificate: Secondary | ICD-10-CM

## 2017-10-23 DIAGNOSIS — R1011 Right upper quadrant pain: Secondary | ICD-10-CM | POA: Diagnosis not present

## 2017-10-23 DIAGNOSIS — K219 Gastro-esophageal reflux disease without esophagitis: Secondary | ICD-10-CM | POA: Diagnosis not present

## 2017-10-23 MED FILL — LINZESS 72 MCG CAPSULE: 72 | 30 days supply | Qty: 30 | Fill #0

## 2017-10-24 ENCOUNTER — Encounter: Payer: Self-pay | Admitting: Internal Medicine

## 2017-11-06 ENCOUNTER — Telehealth: Payer: Self-pay | Admitting: Internal Medicine

## 2017-11-06 DIAGNOSIS — N393 Stress incontinence (female) (male): Secondary | ICD-10-CM | POA: Diagnosis not present

## 2017-11-06 DIAGNOSIS — M62838 Other muscle spasm: Secondary | ICD-10-CM | POA: Diagnosis not present

## 2017-11-06 DIAGNOSIS — K5900 Constipation, unspecified: Secondary | ICD-10-CM | POA: Diagnosis not present

## 2017-11-06 DIAGNOSIS — R3911 Hesitancy of micturition: Secondary | ICD-10-CM | POA: Diagnosis not present

## 2017-11-06 NOTE — Telephone Encounter (Signed)
Copied from [place]. Topic: Quick Communication - See Telephone Encounter >> Nov 06, 2017  2:05 PM Bea Graff, NT wrote: CRM for notification. See Telephone encounter for: Almeid with [employer] is calling in regards to the PA for Erenumab-aooe (AIMOVIG). She states it was started by the pharmacy but several people in the office "touched" it and now it has been deleted and she wanted to see if it was deleted in error or if another med was being prescribed. CB#: [phone] Ref# [id number]  11/06/17.

## 2017-11-06 NOTE — Telephone Encounter (Signed)
This has been faxed to pt insurance.

## 2017-11-13 DIAGNOSIS — M6281 Muscle weakness (generalized): Secondary | ICD-10-CM | POA: Diagnosis not present

## 2017-11-13 DIAGNOSIS — K5900 Constipation, unspecified: Secondary | ICD-10-CM | POA: Diagnosis not present

## 2017-11-13 DIAGNOSIS — R3911 Hesitancy of micturition: Secondary | ICD-10-CM | POA: Diagnosis not present

## 2017-11-13 DIAGNOSIS — N393 Stress incontinence (female) (male): Secondary | ICD-10-CM | POA: Diagnosis not present

## 2017-11-14 MED FILL — AIMOVIG 70 MG/ML SOAJ: 70 | 30 days supply | Qty: 1 | Fill #0

## 2017-11-14 NOTE — Telephone Encounter (Signed)
[facility] calling to check status, call back [phone]

## 2017-11-15 NOTE — Telephone Encounter (Signed)
[facility]. Informed that we are currently working on the PA and have sumbitted additional clinical information regarding same.

## 2017-11-20 DIAGNOSIS — K5900 Constipation, unspecified: Secondary | ICD-10-CM | POA: Diagnosis not present

## 2017-11-20 DIAGNOSIS — R3911 Hesitancy of micturition: Secondary | ICD-10-CM | POA: Diagnosis not present

## 2017-11-20 DIAGNOSIS — N393 Stress incontinence (female) (male): Secondary | ICD-10-CM | POA: Diagnosis not present

## 2017-11-20 DIAGNOSIS — M62838 Other muscle spasm: Secondary | ICD-10-CM | POA: Diagnosis not present

## 2017-11-20 NOTE — Telephone Encounter (Signed)
PA was approved and pharmacy has been informed.

## 2017-11-27 DIAGNOSIS — R3911 Hesitancy of micturition: Secondary | ICD-10-CM | POA: Diagnosis not present

## 2017-11-27 DIAGNOSIS — M62838 Other muscle spasm: Secondary | ICD-10-CM | POA: Diagnosis not present

## 2017-11-27 DIAGNOSIS — N393 Stress incontinence (female) (male): Secondary | ICD-10-CM | POA: Diagnosis not present

## 2017-11-27 DIAGNOSIS — K5900 Constipation, unspecified: Secondary | ICD-10-CM | POA: Diagnosis not present

## 2017-11-27 MED FILL — ESTRADIOL 2 MG TABLET: 2 | 30 days supply | Qty: 30 | Fill #0

## 2017-12-04 DIAGNOSIS — K5900 Constipation, unspecified: Secondary | ICD-10-CM | POA: Diagnosis not present

## 2017-12-04 DIAGNOSIS — R3911 Hesitancy of micturition: Secondary | ICD-10-CM | POA: Diagnosis not present

## 2017-12-04 DIAGNOSIS — M62838 Other muscle spasm: Secondary | ICD-10-CM | POA: Diagnosis not present

## 2017-12-04 DIAGNOSIS — N393 Stress incontinence (female) (male): Secondary | ICD-10-CM | POA: Diagnosis not present

## 2017-12-11 DIAGNOSIS — M62838 Other muscle spasm: Secondary | ICD-10-CM | POA: Diagnosis not present

## 2017-12-11 DIAGNOSIS — K5900 Constipation, unspecified: Secondary | ICD-10-CM | POA: Diagnosis not present

## 2017-12-11 DIAGNOSIS — N393 Stress incontinence (female) (male): Secondary | ICD-10-CM | POA: Diagnosis not present

## 2017-12-11 DIAGNOSIS — R3911 Hesitancy of micturition: Secondary | ICD-10-CM | POA: Diagnosis not present

## 2017-12-18 DIAGNOSIS — N393 Stress incontinence (female) (male): Secondary | ICD-10-CM | POA: Diagnosis not present

## 2017-12-18 DIAGNOSIS — R3911 Hesitancy of micturition: Secondary | ICD-10-CM | POA: Diagnosis not present

## 2017-12-18 DIAGNOSIS — K5900 Constipation, unspecified: Secondary | ICD-10-CM | POA: Diagnosis not present

## 2017-12-18 DIAGNOSIS — M62838 Other muscle spasm: Secondary | ICD-10-CM | POA: Diagnosis not present

## 2017-12-25 DIAGNOSIS — M6281 Muscle weakness (generalized): Secondary | ICD-10-CM | POA: Diagnosis not present

## 2017-12-25 DIAGNOSIS — K5900 Constipation, unspecified: Secondary | ICD-10-CM | POA: Diagnosis not present

## 2017-12-25 DIAGNOSIS — N393 Stress incontinence (female) (male): Secondary | ICD-10-CM | POA: Diagnosis not present

## 2017-12-27 MED FILL — PANTOPRAZOLE SOD DR 40 MG T: 40 | 90 days supply | Qty: 180 | Fill #1

## 2017-12-27 MED FILL — ESTRADIOL 2 MG TABLET: 2 | 30 days supply | Qty: 30 | Fill #1

## 2018-01-29 ENCOUNTER — Other Ambulatory Visit: Payer: Self-pay | Admitting: Internal Medicine

## 2018-01-29 MED FILL — LEVOTHYROXINE 88 MCG TABLET: 88 | 90 days supply | Qty: 90 | Fill #0

## 2018-01-29 MED FILL — ESTRADIOL 2 MG TABLET: 2 | 30 days supply | Qty: 30 | Fill #2

## 2018-02-24 ENCOUNTER — Other Ambulatory Visit: Payer: Self-pay | Admitting: *Deleted

## 2018-02-24 MED ORDER — RIZATRIPTAN BENZOATE 10 MG PO TABS: 10.0000 mg | ORAL_TABLET | ORAL | 0 refills | Status: DC | PRN

## 2018-02-24 MED FILL — RIZATRIPTAN BENZOATE 10 MG: 10 | 30 days supply | Qty: 10 | Fill #0

## 2018-02-26 ENCOUNTER — Other Ambulatory Visit (INDEPENDENT_AMBULATORY_CARE_PROVIDER_SITE_OTHER): Payer: 59

## 2018-02-26 ENCOUNTER — Encounter: Payer: Self-pay | Admitting: Internal Medicine

## 2018-02-26 ENCOUNTER — Ambulatory Visit (INDEPENDENT_AMBULATORY_CARE_PROVIDER_SITE_OTHER): Payer: 59 | Admitting: Internal Medicine

## 2018-02-26 VITALS — BP 120/80 | HR 79 | Temp 98.7°F | Resp 16 | Ht 62.0 in | Wt 144.2 lb

## 2018-02-26 DIAGNOSIS — L609 Nail disorder, unspecified: Secondary | ICD-10-CM

## 2018-02-26 DIAGNOSIS — G43001 Migraine without aura, not intractable, with status migrainosus: Secondary | ICD-10-CM | POA: Diagnosis not present

## 2018-02-26 DIAGNOSIS — Z0000 Encounter for general adult medical examination without abnormal findings: Secondary | ICD-10-CM

## 2018-02-26 DIAGNOSIS — B351 Tinea unguium: Secondary | ICD-10-CM | POA: Diagnosis not present

## 2018-02-26 DIAGNOSIS — E039 Hypothyroidism, unspecified: Secondary | ICD-10-CM | POA: Diagnosis not present

## 2018-02-26 DIAGNOSIS — M79675 Pain in left toe(s): Secondary | ICD-10-CM

## 2018-02-26 LAB — COMPREHENSIVE METABOLIC PANEL
ALBUMIN: 4.3 g/dL (ref 3.5–5.2)
ALK PHOS: 71 U/L (ref 39–117)
ALT: 14 U/L (ref 0–35)
AST: 14 U/L (ref 0–37)
BILIRUBIN TOTAL: 0.4 mg/dL (ref 0.2–1.2)
BUN: 10 mg/dL (ref 6–23)
CALCIUM: 9.5 mg/dL (ref 8.4–10.5)
CHLORIDE: 102 meq/L (ref 96–112)
CO2: 28 mEq/L (ref 19–32)
CREATININE: 1.02 mg/dL (ref 0.40–1.20)
GFR: 72.97 mL/min (ref >60.00)
Glucose, Bld: 86 mg/dL (ref 70–99)
Potassium: 3.9 mEq/L (ref 3.5–5.1)
Sodium: 136 mEq/L (ref 135–145)
Total Protein: 8.2 g/dL (ref 6.0–8.3)

## 2018-02-26 LAB — LIPID PANEL
CHOLESTEROL: 259 mg/dL — AB (ref 0–200)
HDL: 78.8 mg/dL (ref >39.00)
LDL Cholesterol: 150 mg/dL — ABNORMAL HIGH (ref 0–99)
NonHDL: 180.5
Total CHOL/HDL Ratio: 3
Triglycerides: 153 mg/dL — ABNORMAL HIGH (ref 0.0–149.0)
VLDL: 30.6 mg/dL (ref 0.0–40.0)

## 2018-02-26 LAB — TSH: TSH: 5.51 u[IU]/mL — AB (ref 0.35–4.50)

## 2018-02-26 MED ORDER — ERENUMAB-AOOE 70 MG/ML ~~LOC~~ SOAJ
1.0000 | SUBCUTANEOUS | 1 refills | Status: DC
Start: 2018-02-26 — End: 2018-06-26

## 2018-02-26 MED ORDER — TERBINAFINE HCL 250 MG PO TABS: 250.0000 mg | ORAL_TABLET | Freq: Every day | ORAL | 0 refills | Status: DC

## 2018-02-26 MED ORDER — LEVOTHYROXINE SODIUM 100 MCG PO TABS: 100.0000 ug | ORAL_TABLET | Freq: Every day | ORAL | 1 refills | Status: DC

## 2018-02-26 MED FILL — TERBINAFINE HCL 250 MG TABS: 250 | 30 days supply | Qty: 30 | Fill #0

## 2018-02-26 MED FILL — LEVOTHYROXINE 100 MCG TABLE: 100 | 90 days supply | Qty: 90 | Fill #0

## 2018-02-26 NOTE — Progress Notes (Signed)
Subjective:  Patient ID: Chelsey Hunt, female    DOB: Nov 13, 1965  Age: 52 y.o. MRN: [id number]  CC: Headache; Hypothyroidism; and Annual Exam   HPI Dabria Wadas presents for a CPX.  She continues to complain of intermittent migraine headaches.  She has not started using Aimovig yet because she was afraid of it.  Her headaches have not worsened or changed.  She gets symptom relief with a triptan.  She also complains of discomfort in her left great toenail.  She has a toenail fungus and has tried a topical agent without any improvement in her symptoms.  Outpatient Medications Prior to Visit  Medication Sig Dispense Refill  . cetirizine-pseudoephedrine (ZYRTEC-D ALLERGY & CONGESTION) 5-120 MG tablet Take 1 tablet by mouth 2 (two) times daily. 60 tablet 1  . estradiol (ESTRACE) 2 MG tablet estradiol 2 mg tablet    . fluticasone (FLONASE) 50 MCG/ACT nasal spray Place 2 sprays into both nostrils daily. 48 g 1  . pantoprazole (PROTONIX) 40 MG tablet Take 40 mg by mouth 2 (two) times daily.    . rizatriptan (MAXALT) 10 MG tablet Take 1 tablet (10 mg total) by mouth as needed for migraine. May repeat in 2 hours if needed 10 tablet 0  . levothyroxine (SYNTHROID, LEVOTHROID) 88 MCG tablet TAKE 1 TABLET (88 MCG TOTAL) BY MOUTH DAILY. 90 tablet 0  . Erenumab-aooe (AIMOVIG) 70 MG/ML SOAJ Inject 1 Act into the skin every 30 (thirty) days. (Patient not taking: Reported on 02/26/2018) 12 pen 1  . levothyroxine (SYNTHROID, LEVOTHROID) 88 MCG tablet TAKE 1 TABLET BY MOUTH ONCE DAILY 90 tablet 1   No facility-administered medications prior to visit.     ROS Review of Systems  Constitutional: Positive for fatigue. Negative for activity change, appetite change and unexpected weight change.  HENT: Negative.   Eyes: Negative.  Negative for visual disturbance.  Respiratory: Negative for cough, chest tightness, shortness of breath and wheezing.   Cardiovascular: Negative for chest pain and leg  swelling.  Gastrointestinal: Negative for abdominal pain, constipation, diarrhea, nausea and vomiting.  Endocrine: Negative for cold intolerance and heat intolerance.  Genitourinary: Negative.  Negative for difficulty urinating.  Musculoskeletal: Negative.  Negative for arthralgias, joint swelling and myalgias.  Skin: Negative.  Negative for color change, pallor and rash.  Neurological: Positive for headaches. Negative for dizziness, tremors, syncope, facial asymmetry, weakness, light-headedness and numbness.  Hematological: Negative for adenopathy. Does not bruise/bleed easily.  Psychiatric/Behavioral: Negative.     Objective:  BP 120/80 (BP Location: Left Arm, Patient Position: Sitting, Cuff Size: Normal)   Pulse 79   Temp 98.7 F (37.1 C) (Oral)   Resp 16   Ht 5\' 2"  (1.575 m)   Wt 144 lb 4 oz (65.4 kg)   LMP 04/20/2013   SpO2 99%   BMI 26.38 kg/m   BP Readings from Last 3 Encounters:  02/26/18 120/80  10/22/17 110/60  10/08/17 120/80    Wt Readings from Last 3 Encounters:  02/26/18 144 lb 4 oz (65.4 kg)  10/22/17 145 lb 1.3 oz (65.8 kg)  10/08/17 143 lb 12 oz (65.2 kg)    Physical Exam  Constitutional: She is oriented to person, place, and time. No distress.  HENT:  Mouth/Throat: Oropharynx is clear and moist.  Eyes: Pupils are equal, round, and reactive to light. EOM are normal. Right eye exhibits normal extraocular motion and no nystagmus. Left eye exhibits normal extraocular motion and no nystagmus.  Neck: Normal range of motion. Neck supple.  No JVD present.  Cardiovascular: Normal rate, regular rhythm and normal heart sounds.  No murmur heard. Pulmonary/Chest: Effort normal and breath sounds normal. No respiratory distress. She has no wheezes. She has no rales.  Abdominal: Soft. Bowel sounds are normal. She exhibits no distension and no mass. There is no tenderness. There is no guarding.  Musculoskeletal: Normal range of motion. She exhibits no edema or  tenderness.  Neurological: She is alert and oriented to person, place, and time. She has normal strength. She displays normal reflexes. No sensory deficit. She displays a negative Romberg sign. Coordination and gait normal.  Skin: Skin is warm and dry. No rash noted.  Left great toenail shows subungual thickening and debris.  Psychiatric: She has a normal mood and affect. Her behavior is normal. Her mood appears not anxious. She is not agitated.  Vitals reviewed.   Lab Results  Component Value Date   WBC 7.1 04/16/2017   HGB 14.3 04/16/2017   HCT 44.2 04/16/2017   PLT 416.0 (H) 04/16/2017   GLUCOSE 86 02/26/2018   CHOL 259 (H) 02/26/2018   TRIG 153.0 (H) 02/26/2018   HDL 78.80 02/26/2018   LDLDIRECT 133.5 10/23/2013   LDLCALC 150 (H) 02/26/2018   ALT 14 02/26/2018   AST 14 02/26/2018   NA 136 02/26/2018   K 3.9 02/26/2018   CL 102 02/26/2018   CREATININE 1.02 02/26/2018   BUN 10 02/26/2018   CO2 28 02/26/2018   TSH 5.51 (H) 02/26/2018   HGBA1C 5.7 06/13/2016    No results found.  Assessment & Plan:   Cattaleya was seen today for headache, hypothyroidism and annual exam.  Diagnoses and all orders for this visit:  Acquired hypothyroidism-TSH is slightly elevated and she complains of fatigue.  Will increase her levothyroxine dose. -     TSH; Future -     levothyroxine (SYNTHROID, LEVOTHROID) 100 MCG tablet; Take 1 tablet (100 mcg total) by mouth daily.  Routine general medical examination at a health care facility- Exam completed, labs reviewed, mammogram is up-to-date, screening for cervical cancer and colon cancer are up-to-date, vaccines reviewed, patient education material was given. -     Lipid panel; Future  Pain due to onychomycosis of toenail of left foot- Will start terbinafine.  She will let me know if she has any side effects.  She agrees to return in about 4 weeks for me to recheck her liver enzymes.  If all goes well she will take terbinafine for a total of 3  months. -     Comprehensive metabolic panel; Future -     terbinafine (LAMISIL) 250 MG tablet; Take 1 tablet (250 mg total) by mouth daily.  Migraine without aura and with status migrainosus, not intractable-she agrees to start using Aimovig. -     Erenumab-aooe (AIMOVIG) 70 MG/ML SOAJ; Inject 1 Act into the skin every 30 (thirty) days.  Fingernail abnormalities -     Cancel: Ambulatory referral to Dermatology   I have discontinued Ajanay Dinius's levothyroxine and levothyroxine. I am also having her start on levothyroxine and terbinafine. Additionally, I am having her maintain her pantoprazole, estradiol, fluticasone, cetirizine-pseudoephedrine, rizatriptan, and Erenumab-aooe.  Meds ordered this encounter  Medications  . Erenumab-aooe (AIMOVIG) 70 MG/ML SOAJ    Sig: Inject 1 Act into the skin every 30 (thirty) days.    Dispense:  12 pen    Refill:  1  . levothyroxine (SYNTHROID, LEVOTHROID) 100 MCG tablet    Sig: Take 1 tablet (100 mcg  total) by mouth daily.    Dispense:  90 tablet    Refill:  1  . terbinafine (LAMISIL) 250 MG tablet    Sig: Take 1 tablet (250 mg total) by mouth daily.    Dispense:  30 tablet    Refill:  0     Follow-up: Return in about 1 month (around 03/26/2018).  [name], MD

## 2018-02-26 NOTE — Patient Instructions (Signed)

## 2018-02-27 MED FILL — ESTRADIOL 2 MG TABLET: 2 | 30 days supply | Qty: 30 | Fill #3

## 2018-03-19 ENCOUNTER — Encounter: Payer: Self-pay | Admitting: Internal Medicine

## 2018-03-19 ENCOUNTER — Ambulatory Visit: Payer: 59 | Admitting: Internal Medicine

## 2018-03-19 VITALS — BP 124/80 | HR 84 | Temp 98.1°F | Resp 16 | Ht 62.0 in | Wt 143.0 lb

## 2018-03-19 DIAGNOSIS — E039 Hypothyroidism, unspecified: Secondary | ICD-10-CM

## 2018-03-19 DIAGNOSIS — M79675 Pain in left toe(s): Secondary | ICD-10-CM | POA: Diagnosis not present

## 2018-03-19 DIAGNOSIS — R7989 Other specified abnormal findings of blood chemistry: Secondary | ICD-10-CM | POA: Insufficient documentation

## 2018-03-19 DIAGNOSIS — B351 Tinea unguium: Secondary | ICD-10-CM | POA: Diagnosis not present

## 2018-03-19 MED ORDER — LEVOTHYROXINE SODIUM 88 MCG PO TABS: 88.0000 ug | ORAL_TABLET | Freq: Every day | ORAL | 0 refills | Status: DC

## 2018-03-19 NOTE — Progress Notes (Signed)
Subjective:  Patient ID: Chelsey Hunt, female    DOB: [birth year]  Age: 52 y.o. MRN: [id number]  CC: Hypothyroidism   HPI Nahlia Hellmann presents for f/up - Her recent TSH was elevated at 5.5 so I recommended an increase in her levothyroxine dose to 100 mcg a day.  She comes back today to tell me she has not tolerated that.  She thinks it is caused her to have a few more headaches recently and has made her feel nervous, jittery, and twitchy.  She has gone back to the 88 mcg dose and is starting to feel better.  She also did not start taking terbinafine.  She read somewhere that it can cause lupus so she is decided not to take it.  Outpatient Medications Prior to Visit  Medication Sig Dispense Refill  . cetirizine-pseudoephedrine (ZYRTEC-D ALLERGY & CONGESTION) 5-120 MG tablet Take 1 tablet by mouth 2 (two) times daily. 60 tablet 1  . Erenumab-aooe (AIMOVIG) 70 MG/ML SOAJ Inject 1 Act into the skin every 30 (thirty) days. 12 pen 1  . estradiol (ESTRACE) 2 MG tablet estradiol 2 mg tablet    . fluticasone (FLONASE) 50 MCG/ACT nasal spray Place 2 sprays into both nostrils daily. 48 g 1  . pantoprazole (PROTONIX) 40 MG tablet Take 40 mg by mouth 2 (two) times daily.    . rizatriptan (MAXALT) 10 MG tablet Take 1 tablet (10 mg total) by mouth as needed for migraine. May repeat in 2 hours if needed 10 tablet 0  . levothyroxine (SYNTHROID, LEVOTHROID) 100 MCG tablet Take 1 tablet (100 mcg total) by mouth daily. 90 tablet 1  . terbinafine (LAMISIL) 250 MG tablet Take 1 tablet (250 mg total) by mouth daily. 30 tablet 0   No facility-administered medications prior to visit.     ROS Review of Systems  Constitutional: Negative for diaphoresis and fatigue.  HENT: Negative.   Eyes: Negative for visual disturbance.  Respiratory: Negative for chest tightness, shortness of breath and wheezing.   Cardiovascular: Negative for chest pain, palpitations and leg swelling.  Gastrointestinal: Negative  for abdominal pain, constipation, diarrhea, nausea and vomiting.  Endocrine: Negative for cold intolerance and heat intolerance.  Genitourinary: Negative.  Negative for decreased urine volume, difficulty urinating, dysuria, hematuria and urgency.  Musculoskeletal: Positive for myalgias. Negative for back pain.  Skin: Negative.   Neurological: Positive for headaches. Negative for dizziness.  Hematological: Negative for adenopathy. Does not bruise/bleed easily.  Psychiatric/Behavioral: Negative for decreased concentration, dysphoric mood and sleep disturbance. The patient is nervous/anxious.     Objective:  BP 124/80 (BP Location: Left Arm, Patient Position: Sitting, Cuff Size: Normal)   Pulse 84   Temp 98.1 F (36.7 C) (Oral)   Ht 5\' 2"  (1.575 m)   Wt 143 lb (64.9 kg)   LMP 04/20/2013   SpO2 99%   BMI 26.16 kg/m   BP Readings from Last 3 Encounters:  03/19/18 124/80  02/26/18 120/80  10/22/17 110/60    Wt Readings from Last 3 Encounters:  03/19/18 143 lb (64.9 kg)  02/26/18 144 lb 4 oz (65.4 kg)  10/22/17 145 lb 1.3 oz (65.8 kg)    Physical Exam  Constitutional: She is oriented to person, place, and time. No distress.  HENT:  Mouth/Throat: Oropharynx is clear and moist. No oropharyngeal exudate.  Eyes: Conjunctivae are normal. No scleral icterus.  Neck: Normal range of motion. Neck supple. No thyromegaly present.  Cardiovascular: Normal rate, regular rhythm and normal heart sounds.  No  murmur heard. Pulmonary/Chest: Effort normal and breath sounds normal. No respiratory distress. She has no wheezes. She has no rales.  Abdominal: Soft. Bowel sounds are normal. She exhibits no mass. There is no hepatosplenomegaly. There is no tenderness.  Musculoskeletal: Normal range of motion. She exhibits no edema, tenderness or deformity.  Lymphadenopathy:    She has no cervical adenopathy.  Neurological: She is alert and oriented to person, place, and time.  Skin: Skin is warm and  dry. She is not diaphoretic. No pallor.  Psychiatric: She has a normal mood and affect. Her behavior is normal. Judgment and thought content normal.  Vitals reviewed.   Lab Results  Component Value Date   WBC 7.1 04/16/2017   HGB 14.3 04/16/2017   HCT 44.2 04/16/2017   PLT 416.0 (H) 04/16/2017   GLUCOSE 86 02/26/2018   CHOL 259 (H) 02/26/2018   TRIG 153.0 (H) 02/26/2018   HDL 78.80 02/26/2018   LDLDIRECT 133.5 10/23/2013   LDLCALC 150 (H) 02/26/2018   ALT 14 02/26/2018   AST 14 02/26/2018   NA 136 02/26/2018   K 3.9 02/26/2018   CL 102 02/26/2018   CREATININE 1.02 02/26/2018   BUN 10 02/26/2018   CO2 28 02/26/2018   TSH 5.51 (H) 02/26/2018   HGBA1C 5.7 06/13/2016    No results found.  Assessment & Plan:   Linzey was seen today for hypothyroidism.  Diagnoses and all orders for this visit:  Pain due to onychomycosis of toenail of left foot- She has decided not to treat this. -     Comprehensive metabolic panel; Future  Elevated platelet count- I will recheck her platelet count and will check her iron and B12 levels. -     CBC with Differential/Platelet; Future -     IBC panel; Future -     Vitamin B12; Future -     Ferritin; Future -     Folate; Future  Acquired hypothyroidism- She will stay at the 88 mcg a day and I will recheck her TSH in about 2 or 3 months. -     levothyroxine (SYNTHROID, LEVOTHROID) 88 MCG tablet; Take 1 tablet (88 mcg total) by mouth daily.   I have discontinued Sabriah Godfrey's terbinafine. I am also having her maintain her pantoprazole, estradiol, fluticasone, cetirizine-pseudoephedrine, rizatriptan, Erenumab-aooe, and levothyroxine.  Meds ordered this encounter  Medications  . levothyroxine (SYNTHROID, LEVOTHROID) 88 MCG tablet    Sig: Take 1 tablet (88 mcg total) by mouth daily.    Dispense:  90 tablet    Refill:  0     Follow-up: No follow-ups on file.  [name], MD

## 2018-03-20 ENCOUNTER — Encounter: Payer: Self-pay | Admitting: Internal Medicine

## 2018-03-20 NOTE — Patient Instructions (Signed)
Hypothyroidism Hypothyroidism is a disorder of the thyroid. The thyroid is a large gland that is located in the lower front of the neck. The thyroid releases hormones that control how the body works. With hypothyroidism, the thyroid does not make enough of these hormones. What are the causes? Causes of hypothyroidism may include:  Viral infections.  Pregnancy.  Your own defense system (immune system) attacking your thyroid.  Certain medicines.  Birth defects.  Past radiation treatments to your head or neck.  Past treatment with radioactive iodine.  Past surgical removal of part or all of your thyroid.  Problems with the gland that is located in the center of your brain (pituitary).  What are the signs or symptoms? Signs and symptoms of hypothyroidism may include:  Feeling as though you have no energy (lethargy).  Inability to tolerate cold.  Weight gain that is not explained by a change in diet or exercise habits.  Dry skin.  Coarse hair.  Menstrual irregularity.  Slowing of thought processes.  Constipation.  Sadness or depression.  How is this diagnosed? Your health care provider may diagnose hypothyroidism with blood tests and ultrasound tests. How is this treated? Hypothyroidism is treated with medicine that replaces the hormones that your body does not make. After you begin treatment, it may take several weeks for symptoms to go away. Follow these instructions at home:  Take medicines only as directed by your health care provider.  If you start taking any new medicines, tell your health care provider.  Keep all follow-up visits as directed by your health care provider. This is important. As your condition improves, your dosage needs may change. You will need to have blood tests regularly so that your health care provider can watch your condition. Contact a health care provider if:  Your symptoms do not get better with treatment.  You are taking thyroid  replacement medicine and: ? You sweat excessively. ? You have tremors. ? You feel anxious. ? You lose weight rapidly. ? You cannot tolerate heat. ? You have emotional swings. ? You have diarrhea. ? You feel weak. Get help right away if:  You develop chest pain.  You develop an irregular heartbeat.  You develop a rapid heartbeat. This information is not intended to replace advice given to you by your health care provider. Make sure you discuss any questions you have with your health care provider. Document Released: 08/27/2005 Document Revised: 02/02/2016 Document Reviewed: 01/12/2014 Elsevier Interactive Patient Education  2018 [employer]  

## 2018-03-27 ENCOUNTER — Encounter: Payer: Self-pay | Admitting: Podiatry

## 2018-03-27 ENCOUNTER — Ambulatory Visit (INDEPENDENT_AMBULATORY_CARE_PROVIDER_SITE_OTHER): Payer: 59

## 2018-03-27 ENCOUNTER — Ambulatory Visit: Payer: 59 | Admitting: Podiatry

## 2018-03-27 ENCOUNTER — Other Ambulatory Visit: Payer: Self-pay | Admitting: Podiatry

## 2018-03-27 DIAGNOSIS — M79672 Pain in left foot: Secondary | ICD-10-CM | POA: Diagnosis not present

## 2018-03-27 DIAGNOSIS — M722 Plantar fascial fibromatosis: Secondary | ICD-10-CM | POA: Diagnosis not present

## 2018-03-27 DIAGNOSIS — M79671 Pain in right foot: Secondary | ICD-10-CM

## 2018-03-27 NOTE — Patient Instructions (Signed)

## 2018-03-27 NOTE — Progress Notes (Signed)
Subjective:   Patient ID: Chelsey Hunt, female   DOB: 52 y.o.   MRN: [id number]   HPI Patient presents stating she had a lot of pain in her heels and she was concerned but it seems somewhat better and she started to wear her brace again   ROS      Objective:  Physical Exam  Neurovascular status intact with patient's heels improving over where they were before with mild discomfort still noted to quite a bit better than it was several weeks ago     Assessment:  Probability for acute fasciitis condition which occurred bilateral which seems to have improved at the current time     Plan:  H&P conditions reviewed and at this point I recommended physical therapy anti-inflammatories and I reviewed her x-rays.  If symptoms were to persist she is to let us know and I explained her how to use ice appropriately at this time  X-rays indicate there is no spur formation mild depression of the arch no indications of arthritis stress fracture

## 2018-03-28 MED FILL — ESTRADIOL 2 MG TABLET: 2 | 30 days supply | Qty: 30 | Fill #4

## 2018-03-28 MED FILL — PANTOPRAZOLE SOD DR 40 MG T: 40 | 90 days supply | Qty: 180 | Fill #2

## 2018-03-31 ENCOUNTER — Ambulatory Visit: Payer: Self-pay

## 2018-03-31 NOTE — Telephone Encounter (Signed)
Patient called in with c/o "muscle twitching, headache, nervous feeling." She says "I was seen in the office and my thyroid medication was adjusted, because I was having the same symptoms and more. I am still having the feeling as if something is crawling in my head, nausea, shortness of breath and the feeling as if gas is trapped in my chest. All of this started over a month ago and is not getting better." Denies dizziness, vision changes, speech changes. According to protocol, see PCP within 3 days, no availability with PCP, appointment scheduled tomorrow at 1540 with Jodi Mourning, FNP, care advice given, patient verbalized understanding.   Reason for Disposition . [1] Numbness or tingling in one or both hands AND [2] is a chronic symptom (recurrent or ongoing AND present > 4 weeks)  Answer Assessment - Initial Assessment Questions 1. SYMPTOM: "What is the main symptom you are concerned about?" (e.g., weakness, numbness)     Muscle twitching, crawling in head, tingling to side, numbness 2. ONSET: "When did this start?" (minutes, hours, days; while sleeping)     Before the last OV 3. LAST NORMAL: "When was the last time you were normal (no symptoms)?"     Over a month ago 4. PATTERN "Does this come and go, or has it been constant since it started?"  "Is it present now?"     Constant 5. CARDIAC SYMPTOMS: "Have you had any of the following symptoms: chest pain, difficulty breathing, palpitations?"     Shortness of breath, trapped gas in my chest 6. NEUROLOGIC SYMPTOMS: "Have you had any of the following symptoms: headache, dizziness, vision loss, double vision, changes in speech, unsteady on your feet?"     Headache 7. OTHER SYMPTOMS: "Do you have any other symptoms?"     Nervous feeling 8. PREGNANCY: "Is there any chance you are pregnant?" "When was your last menstrual period?"     No  Protocols used: NEUROLOGIC DEFICIT-A-AH

## 2018-04-01 ENCOUNTER — Ambulatory Visit: Payer: 59 | Admitting: Family

## 2018-04-01 ENCOUNTER — Other Ambulatory Visit (INDEPENDENT_AMBULATORY_CARE_PROVIDER_SITE_OTHER): Payer: 59

## 2018-04-01 ENCOUNTER — Encounter: Payer: Self-pay | Admitting: Family

## 2018-04-01 VITALS — BP 122/76 | HR 73 | Temp 97.7°F | Ht 62.0 in | Wt 142.8 lb

## 2018-04-01 DIAGNOSIS — M62838 Other muscle spasm: Secondary | ICD-10-CM | POA: Diagnosis not present

## 2018-04-01 DIAGNOSIS — E039 Hypothyroidism, unspecified: Secondary | ICD-10-CM | POA: Diagnosis not present

## 2018-04-01 DIAGNOSIS — D649 Anemia, unspecified: Secondary | ICD-10-CM

## 2018-04-01 LAB — COMPREHENSIVE METABOLIC PANEL
ALT: 18 U/L (ref 0–35)
AST: 15 U/L (ref 0–37)
Albumin: 4.2 g/dL (ref 3.5–5.2)
Alkaline Phosphatase: 77 U/L (ref 39–117)
BUN: 12 mg/dL (ref 6–23)
CALCIUM: 9.9 mg/dL (ref 8.4–10.5)
CHLORIDE: 103 meq/L (ref 96–112)
CO2: 29 mEq/L (ref 19–32)
Creatinine, Ser: 1.1 mg/dL (ref 0.40–1.20)
GFR: 66.86 mL/min (ref >60.00)
Glucose, Bld: 104 mg/dL — ABNORMAL HIGH (ref 70–99)
Potassium: 4 mEq/L (ref 3.5–5.1)
Sodium: 138 mEq/L (ref 135–145)
Total Bilirubin: 0.3 mg/dL (ref 0.2–1.2)
Total Protein: 8.1 g/dL (ref 6.0–8.3)

## 2018-04-01 LAB — CBC WITH DIFFERENTIAL/PLATELET
BASOS ABS: 0 10*3/uL (ref 0.0–0.1)
BASOS PCT: 0.6 % (ref 0.0–3.0)
EOS ABS: 0.4 10*3/uL (ref 0.0–0.7)
Eosinophils Relative: 7.1 % — ABNORMAL HIGH (ref 0.0–5.0)
HCT: 40.8 % (ref 36.0–46.0)
HEMOGLOBIN: 13.7 g/dL (ref 12.0–15.0)
LYMPHS PCT: 45.4 % (ref 12.0–46.0)
Lymphs Abs: 2.7 10*3/uL (ref 0.7–4.0)
MCHC: 33.5 g/dL (ref 30.0–36.0)
MCV: 91.3 fl (ref 78.0–100.0)
MONO ABS: 0.3 10*3/uL (ref 0.1–1.0)
Monocytes Relative: 5.1 % (ref 3.0–12.0)
NEUTROS ABS: 2.5 10*3/uL (ref 1.4–7.7)
Neutrophils Relative %: 41.8 % — ABNORMAL LOW (ref 43.0–77.0)
PLATELETS: 409 10*3/uL — AB (ref 150.0–400.0)
RBC: 4.47 Mil/uL (ref 3.87–5.11)
RDW: 12.8 % (ref 11.5–15.5)
WBC: 6 10*3/uL (ref 4.0–10.5)

## 2018-04-01 LAB — THYROID PROFILE - CHCC
Free Thyroxine Index: 2.7 (ref 1.4–3.8)
T3 UPTAKE: 25 % (ref 22–35)
T4 TOTAL: 10.9 ug/dL (ref 5.1–11.9)

## 2018-04-01 LAB — VITAMIN B12: VITAMIN B 12: 655 pg/mL (ref 211–911)

## 2018-04-01 LAB — FOLATE: Folate: 11.8 ng/mL (ref >5.9)

## 2018-04-01 LAB — MAGNESIUM: MAGNESIUM: 2 mg/dL (ref 1.5–2.5)

## 2018-04-01 LAB — FERRITIN: FERRITIN: 19.5 ng/mL (ref 10.0–291.0)

## 2018-04-01 LAB — TSH: TSH: 1.46 u[IU]/mL (ref 0.35–4.50)

## 2018-04-01 NOTE — Progress Notes (Signed)
Chelsey Hunt is a 52 y.o. female with the following history as recorded in [employer]:  Patient Active Problem List   Diagnosis Date Noted  . Elevated platelet count 03/19/2018  . Pain due to onychomycosis of toenail of left foot 02/26/2018  . Seasonal allergic rhinitis due to pollen 08/08/2017  . Intractable migraine without aura and without status migrainosus 07/02/2016  . Hyperglycemia 11/28/2015  . Pancreas divisum of native pancreas 12/27/2014  . Routine general medical examination at a health care facility 07/30/2012  . GERD (gastroesophageal reflux disease)   . Migraine 07/29/2011  . Hypothyroidism 08/12/2009  . Irritable bowel syndrome 12/23/2008    Current Outpatient Medications  Medication Sig Dispense Refill  . cetirizine-pseudoephedrine (ZYRTEC-D ALLERGY & CONGESTION) 5-120 MG tablet Take 1 tablet by mouth 2 (two) times daily. 60 tablet 1  . Coenzyme Q10 (CO Q-10) 200 MG CAPS Take 1 capsule by mouth daily.    Eduard Roux (AIMOVIG) 70 MG/ML SOAJ Inject 1 Act into the skin every 30 (thirty) days. 12 pen 1  . estradiol (ESTRACE) 2 MG tablet estradiol 2 mg tablet    . fluticasone (FLONASE) 50 MCG/ACT nasal spray Place 2 sprays into both nostrils daily. 48 g 1  . levothyroxine (SYNTHROID, LEVOTHROID) 88 MCG tablet Take 1 tablet (88 mcg total) by mouth daily. 90 tablet 0  . pantoprazole (PROTONIX) 40 MG tablet Take 40 mg by mouth 2 (two) times daily.    . rizatriptan (MAXALT) 10 MG tablet Take 1 tablet (10 mg total) by mouth as needed for migraine. May repeat in 2 hours if needed 10 tablet 0   No current facility-administered medications for this visit.     Allergies: Propoxyphene n-acetaminophen; Nabumetone; and Topiramate  Past Medical History:  Diagnosis Date  . ACID REFLUX DISEASE 01/03/2008  . Anemia   . ANEMIA, IRON DEFICIENCY 12/23/2008   history  . Chronic tension type headache 01/13/2009   migraines  . Complication of anesthesia    pt states she just needs a  small amount or she will sleep too long  . Dysplasia of cervix, low grade (CIN 1)   . Fibroid   . GERD (gastroesophageal reflux disease)   . HEART MURMUR, SYSTOLIC 17/61/6073   will have echo 06/18/2013  . 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 Irritable bowel syndrome 12/23/2008  . Migraine   . Ovarian cyst   . Ovarian cyst   . THYROID NODULE, RIGHT 04/21/2009    Past Surgical History:  Procedure Laterality Date  . COLONOSCOPY    . HYSTEROSCOPY    . LAPAROSCOPY    . NOVASURE ABLATION    . POLYPECTOMY    . TUBAL LIGATION    . UPPER GI ENDOSCOPY    . VAGINAL HYSTERECTOMY N/A 06/24/2013   Procedure: TOTAL HYSTERECTOMY VAGINAL;  Surgeon: Alwyn Pea, MD;  Location: [facility];  Service: Gynecology;  Laterality: N/A;    Family History  Problem Relation Age of Onset  . Goiter Mother   . Heart disease Mother   . Thyroid disease Mother   . Graves' disease Sister   . Asthma Sister   . Cancer Father   . Heart disease Father   . Goiter Other        Siblings 2  . Hypertension Paternal Grandfather   . Depression Neg Hx   . Alcohol abuse Neg Hx   . Drug abuse Neg Hx   . Early death Neg Hx   . Hearing loss Neg Hx   .  Kidney disease Neg Hx   . Stroke Neg Hx     Social History   Tobacco Use  . Smoking status: Never Smoker  . Smokeless tobacco: Never Used  Substance Use Topics  . Alcohol use: No    Comment: occ    Subjective:  Patient had her Synthroid dosage increased to 100 mcg from 83mg after labs done on 02/26/18; admits that she could not tolerate the increased dose and put herself back on 88 mcg; saw her PCP in follow-up about this and symptoms of muscle spasms, headaches, ears ringing, feeling "off"- it looks like follow-up labs were ordered that day but not actually drawn; notes in general, she has just not felt well since her thyroid medication was adjusted; is now taking her Synthroid daily at the same time- understands the need to be consistent with this  medication.   Objective:  Vitals:   04/01/18 1034  BP: 122/76  Pulse: 73  Temp: 97.7 F (36.5 C)  TempSrc: Oral  SpO2: 99%  Weight: 142 lb 12.8 oz (64.8 kg)  Height: '5\' 2"'$  (1.575 m)    General: Well developed, well nourished, in no acute distress  Skin : Warm and dry.  Head: Normocephalic and atraumatic  Eyes: Sclera and conjunctiva clear; pupils round and reactive to light; extraocular movements intact  Ears: External normal; canals clear; tympanic membranes normal  Oropharynx: Pink, supple. No suspicious lesions  Neck: Supple without thyromegaly, adenopathy  Lungs: Respirations unlabored; clear to auscultation bilaterally without wheeze, rales, rhonchi  CVS exam: normal rate and regular rhythm.  Musculoskeletal: No deformities; no active joint inflammation  Extremities: No edema, cyanosis, clubbing  Vessels: Symmetric bilaterally  Neurologic: Alert and oriented; speech intact; face symmetrical; moves all extremities well; CNII-XII intact without focal deficit   Assessment:  1. Hypothyroidism, unspecified type   2. Muscle spasms of lower extremity, unspecified laterality   3. Anemia, unspecified type     Plan:  Labs updated today; follow-up to be determined based on labs; ? If some of patient's symptoms are actually atypical presentation for migraines- she has Rx for Aimovig but has not yet started; may need to consider migraine evaluation.   No follow-ups on file.  Orders Placed This Encounter  Procedures  . TSH    Standing Status:   Future    Number of Occurrences:   1    Standing Expiration Date:   04/01/2019  . Thyroid Profile    Standing Status:   Future    Number of Occurrences:   1    Standing Expiration Date:   04/02/2019  . CBC w/Diff    Standing Status:   Future    Number of Occurrences:   1    Standing Expiration Date:   04/01/2019  . Comp Met (CMET)    Standing Status:   Future    Number of Occurrences:   1    Standing Expiration Date:   04/01/2019  .  B12    Standing Status:   Future    Number of Occurrences:   1    Standing Expiration Date:   04/01/2019  . Magnesium    Standing Status:   Future    Number of Occurrences:   1    Standing Expiration Date:   04/01/2019  . Ferritin    Standing Status:   Future    Number of Occurrences:   1    Standing Expiration Date:   04/01/2019  . Folate  Standing Status:   Future    Number of Occurrences:   1    Standing Expiration Date:   04/01/2019    Requested Prescriptions    No prescriptions requested or ordered in this encounter

## 2018-04-07 ENCOUNTER — Telehealth: Payer: Self-pay

## 2018-04-07 MED ORDER — SYNTHROID 88 MCG PO TABS: 88.0000 ug | ORAL_TABLET | Freq: Every day | ORAL | 0 refills | Status: DC

## 2018-04-07 NOTE — Telephone Encounter (Signed)
Key: [id number]

## 2018-04-07 NOTE — Telephone Encounter (Signed)
PA came back that it was a covered medication and PA was not required as it is covered.

## 2018-04-11 MED FILL — SYNTHROID 88 MCG TABLET: 88 | 30 days supply | Qty: 30 | Fill #0

## 2018-04-18 ENCOUNTER — Encounter: Payer: Self-pay | Admitting: Internal Medicine

## 2018-04-18 ENCOUNTER — Ambulatory Visit: Payer: 59 | Admitting: Internal Medicine

## 2018-04-18 DIAGNOSIS — G43001 Migraine without aura, not intractable, with status migrainosus: Secondary | ICD-10-CM | POA: Diagnosis not present

## 2018-04-18 NOTE — Patient Instructions (Signed)
I think that the headaches are related to either taking too much tylenol/advil or from lack of sleep.    Analgesic Rebound Headache An analgesic rebound headache, sometimes called a medication overuse headache, is a headache that comes after pain medicine (analgesic) taken to treat the original (primary) headache has worn off. Any type of primary headache can return as a rebound headache if a person regularly takes analgesics more than three times a week to treat it. The types of primary headaches that are commonly associated with rebound headaches include:  Migraines.  Headaches that arise from tense muscles in the head and neck area (tension headaches).  Headaches that develop and happen again (recur) on one side of the head and around the eye (cluster headaches).  If rebound headaches continue, they become chronic daily headaches. What are the causes? This condition may be caused by frequent use of:  Over-the-counter medicines such as aspirin, ibuprofen, and acetaminophen.  Sinus relief medicines and other medicines that contain caffeine.  Narcotic pain medicines such as codeine and oxycodone.  What are the signs or symptoms? The symptoms of a rebound headache are the same as the symptoms of the original headache. Some of the symptoms of specific types of headaches include: Migraine headache  Pulsing or throbbing pain on one or both sides of the head.  Severe pain that interferes with daily activities.  Pain that is worsened by physical activity.  Nausea, vomiting, or both.  Pain with exposure to bright light, loud noises, or strong smells.  General sensitivity to bright light, loud noises, or strong smells.  Visual changes.  Numbness of one or both arms. Tension headache  Pressure around the head.  Dull, aching head pain.  Pain felt over the front and sides of the head.  Tenderness in the muscles of the head, neck, and shoulders. Cluster headache  Severe pain  that begins in or around one eye or temple.  Redness and tearing in the eye on the same side as the pain.  Droopy or swollen eyelid.  One-sided head pain.  Nausea.  Runny nose.  Sweaty, pale facial skin.  Restlessness. How is this diagnosed? This condition is diagnosed by:  Reviewing your medical history. This includes the nature of your primary headaches.  Reviewing the types of pain medicines that you have been using to treat your headaches and how often you take them.  How is this treated? This condition may be treated or managed by:  Discontinuing frequent use of the analgesic medicine. Doing this may worsen your headaches at first, but the pain should eventually become more manageable, less frequent, and less severe.  Seeing a headache specialist. He or she may be able to help you manage your headaches and help make sure there is not another cause of the headaches.  Using methods of stress relief, such as acupuncture, counseling, biofeedback, and massage. Talk with your health care provider about which methods might be good for you.  Follow these instructions at home:  Take over-the-counter and prescription medicines only as told by your health care provider.  Stop the repeated use of pain medicine as told by your health care provider. Stopping can be difficult. Carefully follow instructions from your health care provider.  Avoid triggers that are known to cause your primary headaches.  Keep all follow-up visits as told by your health care provider. This is important. Contact a health care provider if:  You continue to experience headaches after following treatments that your health care  provider recommended. Get help right away if:  You develop new headache pain.  You develop headache pain that is different than what you have experienced in the past.  You develop numbness or tingling in your arms or legs.  You develop changes in your speech or vision. This  information is not intended to replace advice given to you by your health care provider. Make sure you discuss any questions you have with your health care provider. Document Released: 11/17/2003 Document Revised: 03/16/2016 Document Reviewed: 01/30/2016 Elsevier Interactive Patient Education  [employer].

## 2018-04-18 NOTE — Progress Notes (Signed)
   Subjective:    Patient ID: Chelsey Hunt, female    DOB: May 02, 1966, 51 y.o.   MRN: [id number]  HPI The patient is a 52 YO female coming in for daily headaches. She has had migraines in the past. These start off as mild headaches and then get worse. More throbbing on the left side of the head. Sometimes feels like tingling. This is some different than her past migraines. They are more on the back of the head. She has not been sleeping well. Denies fevers or chills. She is sensitive to light and sound when these happen. She has aimovig from back in February and never start taking this monthly. She is taking tylenol daily and advil at least 3 times per week over the counter for the pain. Does not take zyrtec-d. No change in vision or hearing. No numbness or weakness during migraine although she does have some tingling in her head.  Review of Systems  Constitutional: Negative.   HENT: Negative.   Eyes: Negative.   Respiratory: Negative for cough, chest tightness and shortness of breath.   Cardiovascular: Negative for chest pain, palpitations and leg swelling.  Gastrointestinal: Negative for abdominal distention, abdominal pain, constipation, diarrhea, nausea and vomiting.  Musculoskeletal: Negative.   Skin: Negative.   Neurological: Positive for headaches. Negative for dizziness, tremors, seizures, weakness and numbness.  Psychiatric/Behavioral: Positive for sleep disturbance. Negative for behavioral problems and confusion.      Objective:   Physical Exam  Constitutional: She is oriented to person, place, and time. She appears well-developed and well-nourished.  HENT:  Head: Normocephalic and atraumatic.  Eyes: EOM are normal.  Neck: Normal range of motion.  Cardiovascular: Normal rate and regular rhythm.  Pulmonary/Chest: Effort normal and breath sounds normal. No respiratory distress. She has no wheezes. She has no rales.  Abdominal: Soft. Bowel sounds are normal. She exhibits no  distension. There is no tenderness. There is no rebound.  Musculoskeletal: She exhibits no edema.  Neurological: She is alert and oriented to person, place, and time. Coordination normal.  Skin: Skin is warm and dry.  Psychiatric: She has a normal mood and affect.   Vitals:   04/18/18 1338  BP: 122/78  Pulse: 80  Temp: 98.3 F (36.8 C)  TempSrc: Oral  SpO2: 99%  Weight: 142 lb (64.4 kg)  Height: 5\' 2"  (1.575 m)      Assessment & Plan:

## 2018-04-18 NOTE — Assessment & Plan Note (Signed)
She has not tried aimovig. She is not sure that she wants to take it. Suspect that sleep cycle change and increase in usage of over the counter medicines such as tylenol and advil causing rebound effect. She is asked to take melatonin for sleep and stop using tylenol and ibuprofen.

## 2018-04-23 ENCOUNTER — Telehealth: Payer: Self-pay | Admitting: Internal Medicine

## 2018-04-23 NOTE — Telephone Encounter (Signed)
Pt dropped off FMLA paperwork for completion.  PW put in Brittany's box.

## 2018-04-24 DIAGNOSIS — H52223 Regular astigmatism, bilateral: Secondary | ICD-10-CM | POA: Diagnosis not present

## 2018-04-28 NOTE — Telephone Encounter (Signed)
Renewal for forms have been completed & placed in providers box to review and approve.   LOV w/Laura for Migraines : 04/18/18  Next OV w/Jones : 05/06/18

## 2018-04-29 DIAGNOSIS — Z0279 Encounter for issue of other medical certificate: Secondary | ICD-10-CM

## 2018-04-29 NOTE — Telephone Encounter (Signed)
Forms have been signed, Faxed to [employer] @ [phone], Copy sent to scan &charged for.  LVM to inform patient & will mail original to patient.

## 2018-04-30 MED FILL — ESTRADIOL 2 MG TABLET: 2 | 30 days supply | Qty: 30 | Fill #5

## 2018-05-06 ENCOUNTER — Encounter: Payer: Self-pay | Admitting: Internal Medicine

## 2018-05-06 ENCOUNTER — Ambulatory Visit: Payer: 59 | Admitting: Internal Medicine

## 2018-05-06 VITALS — BP 130/80 | HR 75 | Temp 98.5°F | Resp 16 | Ht 62.0 in | Wt 142.0 lb

## 2018-05-06 DIAGNOSIS — G43019 Migraine without aura, intractable, without status migrainosus: Secondary | ICD-10-CM

## 2018-05-06 NOTE — Progress Notes (Signed)
Subjective:  Patient ID: Chelsey Hunt, female    DOB: July 12, 1966  Age: 52 y.o. MRN: [id number]  CC: Headache   HPI Chelsey Hunt presents for f/up - She is having another migraine headache.  The current headache started about 2 days ago and she describes it as a throbbing sensation throughout her entire head.  For some reason she has not tried Maxalt and she has not started using the CGRP antagonist yet.  She complains of fatigue but denies changes in her vision or hearing and she denies nausea, vomiting, dizziness, or paresthesias.  Outpatient Medications Prior to Visit  Medication Sig Dispense Refill  . cetirizine-pseudoephedrine (ZYRTEC-D ALLERGY & CONGESTION) 5-120 MG tablet Take 1 tablet by mouth 2 (two) times daily. 60 tablet 1  . Coenzyme Q10 (CO Q-10) 200 MG CAPS Take 1 capsule by mouth daily.    Marland Kitchen estradiol (ESTRACE) 2 MG tablet estradiol 2 mg tablet    . fluticasone (FLONASE) 50 MCG/ACT nasal spray Place 2 sprays into both nostrils daily. 48 g 1  . levothyroxine (SYNTHROID, LEVOTHROID) 88 MCG tablet Take 1 tablet (88 mcg total) by mouth daily. 90 tablet 0  . pantoprazole (PROTONIX) 40 MG tablet Take 40 mg by mouth 2 (two) times daily.    . rizatriptan (MAXALT) 10 MG tablet Take 1 tablet (10 mg total) by mouth as needed for migraine. May repeat in 2 hours if needed 10 tablet 0  . SYNTHROID 88 MCG tablet Take 1 tablet (88 mcg total) by mouth daily before breakfast. Name brand 30 tablet 0  . Erenumab-aooe (AIMOVIG) 70 MG/ML SOAJ Inject 1 Act into the skin every 30 (thirty) days. (Patient not taking: Reported on 04/18/2018) 12 pen 1   No facility-administered medications prior to visit.     ROS Review of Systems  Constitutional: Positive for fatigue. Negative for chills and fever.  HENT: Negative.   Eyes: Negative for visual disturbance.  Respiratory: Negative.   Cardiovascular: Negative.   Gastrointestinal: Negative.  Negative for blood in stool, nausea and vomiting.    Endocrine: Negative.   Genitourinary: Negative.   Musculoskeletal: Negative.  Negative for back pain and neck pain.  Skin: Negative for rash.  Neurological: Positive for headaches. Negative for dizziness, weakness, light-headedness and numbness.  Hematological: Negative for adenopathy. Does not bruise/bleed easily.  Psychiatric/Behavioral: Negative.     Objective:  BP 130/80 (BP Location: Left Arm, Patient Position: Sitting, Cuff Size: Normal)   Pulse 75   Temp 98.5 F (36.9 C) (Oral)   Resp 16   Ht 5\' 2"  (1.575 m)   Wt 142 lb (64.4 kg)   LMP 04/20/2013   SpO2 99%   BMI 25.97 kg/m   BP Readings from Last 3 Encounters:  05/06/18 130/80  04/18/18 122/78  04/01/18 122/76    Wt Readings from Last 3 Encounters:  05/06/18 142 lb (64.4 kg)  04/18/18 142 lb (64.4 kg)  04/01/18 142 lb 12.8 oz (64.8 kg)    Physical Exam  Constitutional: She is oriented to person, place, and time.  Non-toxic appearance. She does not appear ill. No distress.  Eyes: Pupils are equal, round, and reactive to light. EOM are normal. Right eye exhibits normal extraocular motion and no nystagmus. Left eye exhibits normal extraocular motion and no nystagmus. Right pupil is reactive. Left pupil is reactive.  Neck: Normal range of motion. Neck supple. No JVD present. No neck rigidity.  Cardiovascular: Normal rate, regular rhythm and normal heart sounds.  Pulmonary/Chest: Effort normal and  breath sounds normal. No respiratory distress. She has no wheezes.  Abdominal: Soft. Bowel sounds are normal. She exhibits no distension and no mass. There is no tenderness.  Musculoskeletal: Normal range of motion. She exhibits no edema or tenderness.  Neurological: She is alert and oriented to person, place, and time. She displays normal reflexes. No cranial nerve deficit. She exhibits normal muscle tone. Coordination normal.  Skin: Skin is warm and dry. No rash noted.  Psychiatric: Her behavior is normal. Judgment and  thought content normal.  Vitals reviewed.   Lab Results  Component Value Date   WBC 6.0 04/01/2018   HGB 13.7 04/01/2018   HCT 40.8 04/01/2018   PLT 409.0 (H) 04/01/2018   GLUCOSE 104 (H) 04/01/2018   CHOL 259 (H) 02/26/2018   TRIG 153.0 (H) 02/26/2018   HDL 78.80 02/26/2018   LDLDIRECT 133.5 10/23/2013   LDLCALC 150 (H) 02/26/2018   ALT 18 04/01/2018   AST 15 04/01/2018   NA 138 04/01/2018   K 4.0 04/01/2018   CL 103 04/01/2018   CREATININE 1.10 04/01/2018   BUN 12 04/01/2018   CO2 29 04/01/2018   TSH 1.46 04/01/2018   HGBA1C 5.7 06/13/2016    No results found.  Assessment & Plan:   Sole was seen today for headache.  Diagnoses and all orders for this visit:  Intractable migraine without aura and without status migrainosus- She is having her usual migraine with no alarming symptoms and her neurologic exam is nonfocal.  We gave her her first dose of the CGRP antagonist in the office today.  I encouraged her to use her Maxalt as directed.  She did not want any other treatment for her pain during the visit today.   I am having Chelsey Hunt maintain her pantoprazole, estradiol, fluticasone, cetirizine-pseudoephedrine, rizatriptan, Erenumab-aooe, levothyroxine, Co Q-10, and SYNTHROID.  No orders of the defined types were placed in this encounter.    Follow-up: No follow-ups on file.  Chelsey Calico, MD

## 2018-05-08 ENCOUNTER — Encounter: Payer: Self-pay | Admitting: Internal Medicine

## 2018-05-08 NOTE — Patient Instructions (Signed)
Migraine Headache A migraine headache is an intense, throbbing pain on one side or both sides of the head. Migraines may also cause other symptoms, such as nausea, vomiting, and sensitivity to light and noise. What are the causes? Doing or taking certain things may also trigger migraines, such as:  Alcohol.  Smoking.  Medicines, such as: ? Medicine used to treat chest pain (nitroglycerine). ? Birth control pills. ? Estrogen pills. ? Certain blood pressure medicines.  Aged cheeses, chocolate, or caffeine.  Foods or drinks that contain nitrates, glutamate, aspartame, or tyramine.  Physical activity.  Other things that may trigger a migraine include:  Menstruation.  Pregnancy.  Hunger.  Stress, lack of sleep, too much sleep, or fatigue.  Weather changes.  What increases the risk? The following factors may make you more likely to experience migraine headaches:  Age. Risk increases with age.  Family history of migraine headaches.  Being Caucasian.  Depression and anxiety.  Obesity.  Being a woman.  Having a hole in the heart (patent foramen ovale) or other heart problems.  What are the signs or symptoms? The main symptom of this condition is pulsating or throbbing pain. Pain may:  Happen in any area of the head, such as on one side or both sides.  Interfere with daily activities.  Get worse with physical activity.  Get worse with exposure to bright lights or loud noises.  Other symptoms may include:  Nausea.  Vomiting.  Dizziness.  General sensitivity to bright lights, loud noises, or smells.  Before you get a migraine, you may get warning signs that a migraine is developing (aura). An aura may include:  Seeing flashing lights or having blind spots.  Seeing bright spots, halos, or zigzag lines.  Having tunnel vision or blurred vision.  Having numbness or a tingling feeling.  Having trouble talking.  Having muscle weakness.  How is this  diagnosed? A migraine headache can be diagnosed based on:  Your symptoms.  A physical exam.  Tests, such as CT scan or MRI of the head. These imaging tests can help rule out other causes of headaches.  Taking fluid from the spine (lumbar puncture) and analyzing it (cerebrospinal fluid analysis, or CSF analysis).  How is this treated? A migraine headache is usually treated with medicines that:  Relieve pain.  Relieve nausea.  Prevent migraines from coming back.  Treatment may also include:  Acupuncture.  Lifestyle changes like avoiding foods that trigger migraines.  Follow these instructions at home: Medicines  Take over-the-counter and prescription medicines only as told by your health care provider.  Do not drive or use heavy machinery while taking prescription pain medicine.  To prevent or treat constipation while you are taking prescription pain medicine, your health care provider may recommend that you: ? Drink enough fluid to keep your urine clear or pale yellow. ? Take over-the-counter or prescription medicines. ? Eat foods that are high in fiber, such as fresh fruits and vegetables, whole grains, and beans. ? Limit foods that are high in fat and processed sugars, such as fried and sweet foods. Lifestyle  Avoid alcohol use.  Do not use any products that contain nicotine or tobacco, such as cigarettes and e-cigarettes. If you need help quitting, ask your health care provider.  Get at least 8 hours of sleep every night.  Limit your stress. General instructions   Keep a journal to find out what may trigger your migraine headaches. For example, write down: ? What you eat and   drink. ? How much sleep you get. ? Any change to your diet or medicines.  If you have a migraine: ? Avoid things that make your symptoms worse, such as bright lights. ? It may help to lie down in a dark, quiet room. ? Do not drive or use heavy machinery. ? Ask your health care provider  what activities are safe for you while you are experiencing symptoms.  Keep all follow-up visits as told by your health care provider. This is important. Contact a health care provider if:  You develop symptoms that are different or more severe than your usual migraine symptoms. Get help right away if:  Your migraine becomes severe.  You have a fever.  You have a stiff neck.  You have vision loss.  Your muscles feel weak or like you cannot control them.  You start to lose your balance often.  You develop trouble walking.  You faint. This information is not intended to replace advice given to you by your health care provider. Make sure you discuss any questions you have with your health care provider. Document Released: 08/27/2005 Document Revised: 03/16/2016 Document Reviewed: 02/13/2016 Elsevier Interactive Patient Education  2017 Elsevier Inc.   

## 2018-05-13 ENCOUNTER — Other Ambulatory Visit: Payer: Self-pay | Admitting: Internal Medicine

## 2018-05-13 DIAGNOSIS — E039 Hypothyroidism, unspecified: Secondary | ICD-10-CM

## 2018-05-13 MED ORDER — LEVOTHYROXINE SODIUM 88 MCG PO TABS: 88.0000 ug | ORAL_TABLET | Freq: Every day | ORAL | 0 refills | Status: DC

## 2018-05-13 MED FILL — SYNTHROID 88 MCG TABLET: 88 | 30 days supply | Qty: 30 | Fill #0

## 2018-05-29 MED FILL — ESTRADIOL 2 MG TABLET: 2 | 30 days supply | Qty: 30 | Fill #6

## 2018-06-11 MED FILL — SYNTHROID 88 MCG TABLET: 88 | 30 days supply | Qty: 30 | Fill #1

## 2018-06-25 DIAGNOSIS — N898 Other specified noninflammatory disorders of vagina: Secondary | ICD-10-CM | POA: Diagnosis not present

## 2018-06-25 MED FILL — ESTRADIOL 10 MCG TABS: 10 | 28 days supply | Qty: 8 | Fill #0

## 2018-06-25 MED FILL — TINIDAZOLE 500 MG TABS: 500 | 2 days supply | Qty: 8 | Fill #0

## 2018-06-25 MED FILL — TERCONAZOLE 80 MG SUPP: 80 | 3 days supply | Qty: 3 | Fill #0

## 2018-06-26 ENCOUNTER — Encounter: Payer: Self-pay | Admitting: Neurology

## 2018-06-26 ENCOUNTER — Encounter (HOSPITAL_COMMUNITY): Payer: Self-pay | Admitting: Emergency Medicine

## 2018-06-26 ENCOUNTER — Emergency Department (HOSPITAL_COMMUNITY)
Admission: EM | Admit: 2018-06-26 | Discharge: 2018-06-26 | Disposition: A | Discharge status: Home / Self Care | Payer: 59 | Attending: Emergency Medicine | Admitting: Emergency Medicine

## 2018-06-26 ENCOUNTER — Emergency Department (HOSPITAL_COMMUNITY): Payer: 59

## 2018-06-26 DIAGNOSIS — R51 Headache: Secondary | ICD-10-CM | POA: Diagnosis not present

## 2018-06-26 DIAGNOSIS — R000 Tachycardia, unspecified: Secondary | ICD-10-CM

## 2018-06-26 DIAGNOSIS — Z79899 Other long term (current) drug therapy: Secondary | ICD-10-CM | POA: Insufficient documentation

## 2018-06-26 DIAGNOSIS — R519 Headache, unspecified: Secondary | ICD-10-CM

## 2018-06-26 DIAGNOSIS — E039 Hypothyroidism, unspecified: Secondary | ICD-10-CM | POA: Diagnosis not present

## 2018-06-26 DIAGNOSIS — R002 Palpitations: Secondary | ICD-10-CM | POA: Diagnosis not present

## 2018-06-26 DIAGNOSIS — R0602 Shortness of breath: Secondary | ICD-10-CM | POA: Diagnosis not present

## 2018-06-26 LAB — COMPREHENSIVE METABOLIC PANEL
ALBUMIN: 4 g/dL (ref 3.5–5.0)
ALK PHOS: 70 U/L (ref 38–126)
ALT: 25 U/L (ref 0–44)
ANION GAP: 10 (ref 5–15)
AST: 28 U/L (ref 15–41)
BUN: 11 mg/dL (ref 6–20)
CALCIUM: 10.3 mg/dL (ref 8.9–10.3)
CHLORIDE: 101 mmol/L (ref 98–111)
CO2: 27 mmol/L (ref 22–32)
Creatinine, Ser: 1.01 mg/dL — ABNORMAL HIGH (ref 0.44–1.00)
GFR calc Af Amer: >60 mL/min (ref >60)
GFR calc non Af Amer: >60 mL/min (ref >60)
GLUCOSE: 107 mg/dL — AB (ref 70–99)
Potassium: 3.9 mmol/L (ref 3.5–5.1)
SODIUM: 138 mmol/L (ref 135–145)
Total Bilirubin: 0.3 mg/dL (ref 0.3–1.2)
Total Protein: 7.9 g/dL (ref 6.5–8.1)

## 2018-06-26 LAB — CBC WITH DIFFERENTIAL/PLATELET
ABS IMMATURE GRANULOCYTES: 0.01 10*3/uL (ref 0.00–0.07)
BASOS ABS: 0 10*3/uL (ref 0.0–0.1)
BASOS PCT: 1 %
Eosinophils Absolute: 0.3 10*3/uL (ref 0.0–0.5)
Eosinophils Relative: 4 %
HCT: 41.1 % (ref 36.0–46.0)
HEMOGLOBIN: 13.3 g/dL (ref 12.0–15.0)
IMMATURE GRANULOCYTES: 0 %
LYMPHS PCT: 48 %
Lymphs Abs: 2.9 10*3/uL (ref 0.7–4.0)
MCH: 30 pg (ref 26.0–34.0)
MCHC: 32.4 g/dL (ref 30.0–36.0)
MCV: 92.6 fL (ref 80.0–100.0)
MONO ABS: 0.4 10*3/uL (ref 0.1–1.0)
Monocytes Relative: 6 %
NEUTROS ABS: 2.5 10*3/uL (ref 1.7–7.7)
NEUTROS PCT: 41 %
NRBC: 0 % (ref 0.0–0.2)
PLATELETS: 417 10*3/uL — AB (ref 150–400)
RBC: 4.44 MIL/uL (ref 3.87–5.11)
RDW: 12.6 % (ref 11.5–15.5)
WBC: 6.1 10*3/uL (ref 4.0–10.5)

## 2018-06-26 LAB — I-STAT TROPONIN, ED: Troponin i, poc: 0 ng/mL (ref 0.00–0.08)

## 2018-06-26 MED ORDER — LORAZEPAM 1 MG PO TABS
0.5000 mg | ORAL_TABLET | Freq: Once | ORAL | Status: AC
  Administered 2018-06-26: 0.5 mg via ORAL
  Filled 2018-06-26: qty 1

## 2018-06-26 MED ORDER — SODIUM CHLORIDE 0.9 % IV BOLUS
500.0000 mL | Freq: Once | INTRAVENOUS | Status: AC
  Administered 2018-06-26: 500 mL via INTRAVENOUS

## 2018-06-26 MED ORDER — DIPHENHYDRAMINE HCL 50 MG/ML IJ SOLN
25.0000 mg | Freq: Once | INTRAMUSCULAR | Status: AC
  Administered 2018-06-26: 25 mg via INTRAVENOUS
  Filled 2018-06-26: qty 1

## 2018-06-26 MED ORDER — METOCLOPRAMIDE HCL 5 MG/ML IJ SOLN
10.0000 mg | Freq: Once | INTRAMUSCULAR | Status: AC
  Administered 2018-06-26: 10 mg via INTRAVENOUS
  Filled 2018-06-26: qty 2

## 2018-06-26 NOTE — ED Provider Notes (Signed)
[facility] EMERGENCY DEPARTMENT Provider Note   CSN: [id number] Arrival date & time: 06/26/18  0444     History   Chief Complaint Chief Complaint  Patient presents with  . Palpitations    HPI Chelsey Hunt is a 52 y.o. female.  Patient with history of chronic headaches on a daily basis presents the emergency department with bilateral throbbing in her head with associated palpitations and twitching and feeling "jittery".  Patient also has a history of hypothyroidism however was stable with her thyroid labs 2 months ago on last recheck.  Patient works here in hospital.  She started feeling unwell last night and checked her heart rate which was found to be 110-130.  This prompted emergency department visit.  She has not had any head injuries, fevers, confusion.  She notes that when she gets headaches she develops twitching and paresthesias in her extremities.  No weakness.  She has been taking Tylenol and ibuprofen for her symptoms.  She denies any chest pain or shortness of breath.  No fever or cough.  Patient denies abdominal pain, nausea, vomiting, or diarrhea.  No treatments otherwise prior to arrival.  Patient states that she has had symptoms similar to this for several months.  She is to see a neurologist but they retired and currently only sees her primary care doctor. The onset of this condition was acute. The course is constant. Aggravating factors: none. Alleviating factors: none.       Past Medical History:  Diagnosis Date  . ACID REFLUX DISEASE 01/03/2008  . Anemia   . ANEMIA, IRON DEFICIENCY 12/23/2008   history  . Chronic tension type headache 01/13/2009   migraines  . Complication of anesthesia    pt states she just needs a small amount or she will sleep too long  . Dysplasia of cervix, low grade (CIN 1)   . Fibroid   . GERD (gastroesophageal reflux disease)   . HEART MURMUR, SYSTOLIC 44/11/4740   will have echo 06/18/2013  . HYPOTHYROIDISM,  POST-RADIATION 08/12/2009  . Irritable bowel syndrome 12/23/2008  . Migraine   . Ovarian cyst   . Ovarian cyst   . THYROID NODULE, RIGHT 04/21/2009    Patient Active Problem List   Diagnosis Date Noted  . Elevated platelet count 03/19/2018  . Seasonal allergic rhinitis due to pollen 08/08/2017  . Intractable migraine without aura and without status migrainosus 07/02/2016  . Hyperglycemia 11/28/2015  . Pancreas divisum of native pancreas 12/27/2014  . Routine general medical examination at a health care facility 07/30/2012  . GERD (gastroesophageal reflux disease)   . Migraine 07/29/2011  . Hypothyroidism 08/12/2009  . Irritable bowel syndrome 12/23/2008    Past Surgical History:  Procedure Laterality Date  . ABDOMINAL HYSTERECTOMY    . COLONOSCOPY    . HYSTEROSCOPY    . LAPAROSCOPY    . NOVASURE ABLATION    . POLYPECTOMY    . TUBAL LIGATION    . UPPER GI ENDOSCOPY    . VAGINAL HYSTERECTOMY N/A 06/24/2013   Procedure: TOTAL HYSTERECTOMY VAGINAL;  Surgeon: Alwyn Pea, MD;  Location: [facility];  Service: Gynecology;  Laterality: N/A;     OB History    Gravida  3   Para  3   Term      Preterm      AB      Living  3     SAB      TAB      Ectopic  Multiple      Live Births  3            Home Medications    Prior to Admission medications   Medication Sig Start Date End Date Taking? Authorizing Provider  cetirizine-pseudoephedrine (ZYRTEC-D ALLERGY & CONGESTION) 5-120 MG tablet Take 1 tablet by mouth 2 (two) times daily. 10/08/17   Janith Lima, MD  Coenzyme Q10 (CO Q-10) 200 MG CAPS Take 1 capsule by mouth daily.    [provider]  Erenumab-aooe (AIMOVIG) 70 MG/ML SOAJ Inject 1 Act into the skin every 30 (thirty) days. Patient not taking: Reported on 04/18/2018 02/26/18   Janith Lima, MD  estradiol (ESTRACE) 2 MG tablet estradiol 2 mg tablet    [provider]  fluticasone (FLONASE) 50 MCG/ACT nasal spray Place 2 sprays into  both nostrils daily. 08/13/17   Janith Lima, MD  levothyroxine (SYNTHROID, LEVOTHROID) 88 MCG tablet Take 1 tablet (88 mcg total) by mouth daily. 05/13/18   Janith Lima, MD  pantoprazole (PROTONIX) 40 MG tablet Take 40 mg by mouth 2 (two) times daily.    [provider]  rizatriptan (MAXALT) 10 MG tablet Take 1 tablet (10 mg total) by mouth as needed for migraine. May repeat in 2 hours if needed 02/24/18   Janith Lima, MD    Family History Family History  Problem Relation Age of Onset  . Goiter Mother   . Heart disease Mother   . Thyroid disease Mother   . Graves' disease Sister   . Asthma Sister   . Cancer Father   . Heart disease Father   . Goiter Other        Siblings 2  . Hypertension Paternal Grandfather   . Depression Neg Hx   . Alcohol abuse Neg Hx   . Drug abuse Neg Hx   . Early death Neg Hx   . Hearing loss Neg Hx   . Kidney disease Neg Hx   . Stroke Neg Hx     Social History Social History   Tobacco Use  . Smoking status: Never Smoker  . Smokeless tobacco: Never Used  Substance Use Topics  . Alcohol use: No    Comment: occ  . Drug use: No     Allergies   Propoxyphene n-acetaminophen; Nabumetone; and Topiramate   Review of Systems Review of Systems  Constitutional: Negative for fever.  HENT: Negative for congestion, dental problem, rhinorrhea and sinus pressure.   Eyes: Negative for photophobia, discharge, redness and visual disturbance.  Respiratory: Negative for shortness of breath.   Cardiovascular: Positive for palpitations. Negative for chest pain.  Gastrointestinal: Negative for nausea and vomiting.  Musculoskeletal: Negative for gait problem, neck pain and neck stiffness.  Skin: Negative for rash.  Neurological: Positive for tremors and headaches. Negative for syncope, speech difficulty, weakness, light-headedness and numbness.  Psychiatric/Behavioral: Negative for confusion. The patient is nervous/anxious.      Physical  Exam Updated Vital Signs BP 133/63 (BP Location: Right Arm)   Pulse (!) 112   Temp 97.6 F (36.4 C) (Oral)   Resp 16   LMP 04/20/2013   SpO2 100%   Physical Exam  Constitutional: She is oriented to person, place, and time. She appears well-developed and well-nourished.  HENT:  Head: Normocephalic and atraumatic.  Right Ear: Tympanic membrane, external ear and ear canal normal.  Left Ear: Tympanic membrane, external ear and ear canal normal.  Nose: Nose normal.  Mouth/Throat: Uvula is midline,  oropharynx is clear and moist and mucous membranes are normal.  Eyes: Pupils are equal, round, and reactive to light. Conjunctivae, EOM and lids are normal. Right eye exhibits no nystagmus. Left eye exhibits no nystagmus.  Neck: Normal range of motion. Neck supple.  Cardiovascular: Regular rhythm. Tachycardia present.  Pulmonary/Chest: Effort normal and breath sounds normal.  Abdominal: Soft. There is no tenderness.  Musculoskeletal:       Cervical back: She exhibits normal range of motion, no tenderness and no bony tenderness.  Neurological: She is alert and oriented to person, place, and time. She has normal strength and normal reflexes. No cranial nerve deficit or sensory deficit. She displays a negative Romberg sign. Coordination and gait normal. GCS eye subscore is 4. GCS verbal subscore is 5. GCS motor subscore is 6.  Slight tremors  Skin: Skin is warm and dry.  Psychiatric: Her mood appears anxious. She does not exhibit a depressed mood.  Nursing note and vitals reviewed.    ED Treatments / Results  Labs (all labs ordered are listed, but only abnormal results are displayed) Labs Reviewed  COMPREHENSIVE METABOLIC PANEL - Abnormal; Notable for the following components:      Result Value   Glucose, Bld 107 (*)    Creatinine, Ser 1.01 (*)    All other components within normal limits  CBC WITH DIFFERENTIAL/PLATELET - Abnormal; Notable for the following components:   Platelets 417 (*)     All other components within normal limits  CBC WITH DIFFERENTIAL/PLATELET  I-STAT TROPONIN, ED    EKG EKG Interpretation  Date/Time:  Thursday June 26 2018 04:49:31 EDT Ventricular Rate:  113 PR Interval:  144 QRS Duration: 74 QT Interval:  340 QTC Calculation: 466 R Axis:   88 Text Interpretation:  Sinus tachycardia Confirmed by Dory Horn) on 06/26/2018 5:31:20 AM   Radiology Dg Chest 2 View  Result Date: 06/26/2018 CLINICAL DATA:  Shortness of breath and palpitations for the past week. EXAM: CHEST - 2 VIEW COMPARISON:  11/17/2015 FINDINGS: The heart size and mediastinal contours are within normal limits. Both lungs are clear. The visualized skeletal structures are unremarkable. IMPRESSION: No active cardiopulmonary disease. Electronically Signed   By: Lucienne Capers M.D.   On: 06/26/2018 05:21    Procedures Procedures (including critical care time)  Medications Ordered in ED Medications  metoCLOPramide (REGLAN) injection 10 mg (10 mg Intravenous Given 06/26/18 0647)  diphenhydrAMINE (BENADRYL) injection 25 mg (25 mg Intravenous Given 06/26/18 0647)  LORazepam (ATIVAN) tablet 0.5 mg (0.5 mg Oral Given 06/26/18 0646)  sodium chloride 0.9 % bolus 500 mL (0 mLs Intravenous Stopped 06/26/18 0717)  sodium chloride 0.9 % bolus 500 mL (500 mLs Intravenous New Bag/Given 06/26/18 0819)     Initial Impression / Assessment and Plan / ED Course  I have reviewed the triage vital signs and the nursing notes.  Pertinent labs & imaging results that were available during my care of the patient were reviewed by me and considered in my medical decision making (see chart for details).     Patient seen and examined.  EKG reviewed.  No signs of QT prolongation, WPW, Brugada syndrome, LVH, other arrhythmia. Work-up initiated. Medications ordered for headache and anxiety.  Vital signs reviewed and are as follows: BP 133/63 (BP Location: Right Arm)   Pulse (!) 112    Temp 97.6 F (36.4 C) (Oral)   Resp 16   LMP 04/20/2013   SpO2 100%   9:42 AM patient evaluated after treatment.  She was not feeling any better and heart rate was 120-130.  Will give additional fluid bolus.  Patient was rechecked with improvement in heart rate 100-105.  She is feeling a lot more comfortable at this time.  She is comfortable with discharge home.  She has appointments already scheduled with her cardiologist, Dr. Einar Gip and her primary care doctor.  I have placed an ambulatory referral to neurology for evaluation of her daily headaches.  Patient counseled to return if they have weakness in their arms or legs, slurred speech, trouble walking or talking, confusion, trouble with their balance, or if they have any other concerns. Patient verbalizes understanding and agrees with plan.    Final Clinical Impressions(s) / ED Diagnoses   Final diagnoses:  Acute nonintractable headache, unspecified headache type  Palpitation  Tachycardia   HA: Patient without high-risk features of headache including: sudden onset/thunderclap HA, no similar headache in past, altered mental status, accompanying seizure, headache with exertion,  history of immunocompromise, neck or shoulder pain, fever, use of anticoagulation, family history of spontaneous SAH, concomitant drug use, toxic exposure.   Patient has a normal complete neurological exam, normal level of consciousness, no signs of meningismus, is well-appearing/non-toxic appearing, no signs of trauma.   Imaging with CT/MRI not indicated given history and physical exam findings.   No dangerous or life-threatening conditions suspected or identified by history, physical exam, and by work-up. No indications for hospitalization identified.   Ambulatory referral to neurology made.  Tachypalpitations: Sinus tachycardia on EKG.  Patient without signs of DVT or PE.  No chest pain or shortness of breath. Patient does have tachycardia that has ranged  between 100 and 130 while in the emergency department.  She does have some tremulousness with this.  She does seem to have distinct starts and ends to her symptoms.  If symptoms do not improve, she may want to have her primary care doctor evaluate for pheochromocytoma given her headaches with associated tachypalpitations.  She has had some mild hypertension in the emergency department while she was more symptomatic.  Blood pressure improved when she felt better.  Patient denies heavy sweating with episodes.  Do not feel that this needs to be done today.  Anxiety is also possible etiology.  Patient will avoid caffeine and medications which make this worse.  She works third shift and reportedly does not sleep well either.  She has follow-up with her PCP as well as cardiology.    ED Discharge Orders         Ordered    Ambulatory referral to Neurology    Comments:  An appointment is requested in approximately: 2 weeks  Patient with daily chronic HA not responding to OTC meds.   06/26/18 [place], Taylour Lietzke, PA-C 06/26/18 [place], April, MD 07/01/18 1327

## 2018-06-26 NOTE — ED Triage Notes (Signed)
Patient reports palpitations this morning with mild SOB , denies chest pain , no emesis or diaphoresis . No cough or fever .

## 2018-06-26 NOTE — Discharge Instructions (Signed)
Please read and follow all provided instructions.  Your diagnoses today include:  1. Acute nonintractable headache, unspecified headache type   2. Palpitation   3. Tachycardia     Tests performed today include:  Blood counts and electrolytes  EKG and troponin -shows fast heart rate but no signs of heart attack  Vital signs. See below for your results today.   Medications:  In the Emergency Department you received:  Reglan - antinausea/headache medication  Benadryl - antihistamine to counteract potential side effects of reglan  Take any prescribed medications only as directed.  Additional information:  Follow any educational materials contained in this packet.  You are having a headache. No specific cause was found today for your headache. It may have been a migraine or other cause of headache. Stress, anxiety, fatigue, and depression are common triggers for headaches.   Your headache today does not appear to be life-threatening or require hospitalization, but often the exact cause of headaches is not determined in the emergency department. Therefore, follow-up with your doctor is very important to find out what may have caused your headache and whether or not you need any further diagnostic testing or treatment.   Sometimes headaches can appear benign (not harmful), but then more serious symptoms can develop which should prompt an immediate re-evaluation by your doctor or the emergency department.  BE VERY CAREFUL not to take multiple medicines containing Tylenol (also called acetaminophen). Doing so can lead to an overdose which can damage your liver and cause liver failure and possibly death.   Follow-up instructions: Please follow-up with your primary care provider in the next 3 days for further evaluation of your symptoms.   I have placed a referral for you to [facility] neurology for further evaluation of your chronic daily headaches.  Return instructions:   Please return  to the Emergency Department if you experience worsening symptoms.  Return if the medications do not resolve your headache, if it recurs, or if you have multiple episodes of vomiting or cannot keep down fluids.  Return if you have a change from the usual headache.  RETURN IMMEDIATELY IF you:  Develop a sudden, severe headache  Develop confusion or become poorly responsive or faint  Develop a fever above 100.55F or problem breathing  Have a change in speech, vision, swallowing, or understanding  Develop new weakness, numbness, tingling, incoordination in your arms or legs  Have a seizure  Please return if you have any other emergent concerns.  Additional Information:  Your vital signs today were: BP 108/69    Pulse (!) 112    Temp 97.6 F (36.4 C) (Oral)    Resp (!) 22    LMP 04/20/2013    SpO2 99%  If your blood pressure (BP) was elevated above 135/85 this visit, please have this repeated by your doctor within one month. --------------

## 2018-06-30 ENCOUNTER — Ambulatory Visit: Payer: Self-pay

## 2018-06-30 NOTE — Telephone Encounter (Signed)
Pt. called to report intermittent pain and heaviness in left LE.  Denied any swelling, redness, or warmth of left LE.  Reported last week on Mon. or Tues., she felt a sensation in left lower leg "like a shoe lace unraveled on my shin; it felt very weird".  After that incident, reported has had an intermittent sharp pain in the left leg, below the calf.  Also, c/o a "heaviness" in the left leg from hip all the way down the leg.  Stated she has a job that she is on her feet a lot, and recently worked 3 days in a row, being on her feet more consistently.  Reported she put her compression stockings on last night, and has some improvement in left LE discomfort.  In questioning for other symptoms, denied chest pain or shortness of breath today.  Reported she "had a lot of gas discomfort in left breast yesterday that lasted from 11:00 AM to about 7:00 PM."  Reported burping a lot, and obtaining intermittent relief.  Denied radiation of the pain.  Reported she has a lot of reflux, and it is not unusual to burp intermittently.  Denied nausea/ vomiting, or shortness of breath.  C/o left arm tingling x 1 yesterday, that "lasted less than one minute."  Also, reported she had a tightness in her shoulders and lower posterior neck yesterday, that has resolved today.  The pt. reported she was seen in the ER recently for migraine headache and heart palpitations, and is already sched. for a hospital ER f/u  on 10/30.  Advised to call, prior to f/u on 10/30, if has recurrence of chest pain or shortness of breath.  Based on left LE discomfort, appt. sched. at PCP office 10/22 @ 10:00 AM.  Care advice given per protocol.  Pt. Verb. Understanding and agreed with plan.                Reason for Disposition . [1] MODERATE pain (e.g., interferes with normal activities, limping) AND [2] present > 3 days . [1] Patient claims chest pain is same as previously diagnosed "heartburn" AND [2] describes burning in chest AND [3] accompanying  sour taste in mouth    Reported has intermittent reflux and freq. burping.  Answer Assessment - Initial Assessment Questions 1. ONSET: "When did the pain start?"      Monday or Tuesday last week 2. LOCATION: "Where is the pain located?"      Feels intermittent "sharp" pain just below the left calf; doesn't last long  3. PAIN: "How bad is the pain?"    (Scale 1-10; or mild, moderate, severe)   -  MILD (1-3): doesn't interfere with normal activities    -  MODERATE (4-7): interferes with normal activities (e.g., work or school) or awakens from sleep, limping    -  SEVERE (8-10): excruciating pain, unable to do any normal activities, unable to walk     4-5/10 4. WORK OR EXERCISE: "Has there been any recent work or exercise that involved this part of the body?"      Had an incident at work with throwing a linen bag and felt something in her  Buttocks and lower back; denied any discomfort in leg at that time.  5. CAUSE: "What do you think is causing the leg pain?"     Unknown  6. OTHER SYMPTOMS: "Do you have any other symptoms?" (e.g., chest pain, back pain, breathing difficulty, swelling, rash, fever, numbness, weakness)     Yesterday  c/o "a lot of gas in her left chest and tingling left arm and tight shoulders and neck" ; gas and discomfort in left chest lasted 7 hrs. ; tingling left arm was less than one minute; stated improvement in tightness of shoulders and lower posterior neck ; denied shortness of breath ; c/o headache."    7. PREGNANCY: "Is there any chance you are pregnant?" "When was your last menstrual period?"    Had hysterectomy  Answer Assessment - Initial Assessment Questions 1. LOCATION: "Where does it hurt?"       Denied pain at present ; reported had pain and gas in left breast yesterday x approx. 7 hrs.  2. RADIATION: "Does the pain go anywhere else?" (e.g., into neck, jaw, arms, back)    Denied  3. ONSET: "When did the chest pain begin?" (Minutes, hours or days)      11:00  AM- 7:00 PM 4. PATTERN "Does the pain come and go, or has it been constant since it started?"  "Does it get worse with exertion?"      Comes and goes  5. DURATION: "How long does it last" (e.g., seconds, minutes, hours)     Yesterday it lasted about 7 hrs.  Reported it as "gas pain."  6. SEVERITY: "How bad is the pain?"  (e.g., Scale 1-10; mild, moderate, or severe)    - MILD (1-3): doesn't interfere with normal activities     - MODERATE (4-7): interferes with normal activities or awakens from sleep    - SEVERE (8-10): excruciating pain, unable to do any normal activities      4-5/10 7. CARDIAC RISK FACTORS: "Do you have any history of heart problems or risk factors for heart disease?" (e.g., prior heart attack, angina; high blood pressure, diabetes, being overweight, high cholesterol, smoking, or strong family history of heart disease)     Some overweight ; denied diabetes, htn, smoking; Mother (62) and Father (11) died of heart attack 8. PULMONARY RISK FACTORS: "Do you have any history of lung disease?"  (e.g., blood clots in lung, asthma, emphysema, birth control pills)     Denied the above 9. CAUSE: "What do you think is causing the chest pain?"     Unknown 10. OTHER SYMPTOMS: "Do you have any other symptoms?" (e.g., dizziness, nausea, vomiting, sweating, fever, difficulty breathing, cough)       Denied dizziness, nausea, vomiting, sweating, shortness of breath; denied recent fever / chills or cough  11. PREGNANCY: "Is there any chance you are pregnant?" "When was your last menstrual period?"       Hx of hysterectomy  Protocols used: LEG PAIN-A-AH, CHEST PAIN-A-AH

## 2018-07-01 ENCOUNTER — Ambulatory Visit: Payer: 59 | Admitting: Family

## 2018-07-01 ENCOUNTER — Encounter: Payer: Self-pay | Admitting: Family

## 2018-07-01 VITALS — BP 124/80 | HR 80 | Temp 98.2°F | Ht 62.0 in | Wt 139.2 lb

## 2018-07-01 DIAGNOSIS — M79605 Pain in left leg: Secondary | ICD-10-CM | POA: Diagnosis not present

## 2018-07-01 DIAGNOSIS — M5442 Lumbago with sciatica, left side: Secondary | ICD-10-CM | POA: Diagnosis not present

## 2018-07-01 MED ORDER — CYCLOBENZAPRINE HCL 10 MG PO TABS: 10.0000 mg | ORAL_TABLET | Freq: Three times a day (TID) | ORAL | 0 refills | Status: DC | PRN

## 2018-07-01 MED ORDER — NAPROXEN 500 MG PO TABS: 500.0000 mg | ORAL_TABLET | Freq: Two times a day (BID) | ORAL | 0 refills | Status: DC

## 2018-07-01 MED FILL — ESTRADIOL 2 MG TABLET: 2 | 30 days supply | Qty: 30 | Fill #7

## 2018-07-01 MED FILL — CYCLOBENZAPRINE 10 MG TAB: 10 | 10 days supply | Qty: 30 | Fill #0

## 2018-07-01 MED FILL — NAPROXEN 500 MG TABLET: 500 | 15 days supply | Qty: 30 | Fill #0

## 2018-07-01 NOTE — Progress Notes (Signed)
Chelsey Hunt is a 52 y.o. female with the following history as recorded in Epic[employer]:  Patient Active Problem List   Diagnosis Date Noted  . Elevated platelet count 03/19/2018  . Seasonal allergic rhinitis due to pollen 08/08/2017  . Intractable migraine without aura and without status migrainosus 07/02/2016  . Hyperglycemia 11/28/2015  . Pancreas divisum of native pancreas 12/27/2014  . Routine general medical examination at a health care facility 07/30/2012  . GERD (gastroesophageal reflux disease)   . Migraine 07/29/2011  . Hypothyroidism 08/12/2009  . Irritable bowel syndrome 12/23/2008    Current Outpatient Medications  Medication Sig Dispense Refill  . acetaminophen (TYLENOL) 325 MG tablet Take 650 mg by mouth every 6 (six) hours as needed for mild pain.    . cyclobenzaprine (FLEXERIL) 10 MG tablet Take 1 tablet (10 mg total) by mouth 3 (three) times daily as needed for muscle spasms. 30 tablet 0  . estradiol (ESTRACE) 2 MG tablet Take 2 mg by mouth daily.     Marland Kitchen levothyroxine (SYNTHROID, LEVOTHROID) 88 MCG tablet Take 1 tablet (88 mcg total) by mouth daily. 90 tablet 0  . naproxen (NAPROSYN) 500 MG tablet Take 1 tablet (500 mg total) by mouth 2 (two) times daily with a meal. 30 tablet 0  . pantoprazole (PROTONIX) 40 MG tablet Take 40 mg by mouth 2 (two) times daily.    . rizatriptan (MAXALT) 10 MG tablet Take 1 tablet (10 mg total) by mouth as needed for migraine. May repeat in 2 hours if needed 10 tablet 0   No current facility-administered medications for this visit.     Allergies: Propoxyphene n-acetaminophen; Nabumetone; and Topiramate  Past Medical History:  Diagnosis Date  . ACID REFLUX DISEASE 01/03/2008  . Anemia   . ANEMIA, IRON DEFICIENCY 12/23/2008   history  . Chronic tension type headache 01/13/2009   migraines  . Complication of anesthesia    pt states she just needs a small amount or she will sleep too long  . Dysplasia of cervix, low grade (CIN 1)   .  Fibroid   . GERD (gastroesophageal reflux disease)   . HEART MURMUR, SYSTOLIC 35/70/1779   will have echo 06/18/2013  . 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 Irritable bowel syndrome 12/23/2008  . Migraine   . Ovarian cyst   . Ovarian cyst   . THYROID NODULE, RIGHT 04/21/2009    Past Surgical History:  Procedure Laterality Date  . ABDOMINAL HYSTERECTOMY    . COLONOSCOPY    . HYSTEROSCOPY    . LAPAROSCOPY    . NOVASURE ABLATION    . POLYPECTOMY    . TUBAL LIGATION    . UPPER GI ENDOSCOPY    . VAGINAL HYSTERECTOMY N/A 06/24/2013   Procedure: TOTAL HYSTERECTOMY VAGINAL;  Surgeon: Alwyn Pea, MD;  Location: [facility];  Service: Gynecology;  Laterality: N/A;    Family History  Problem Relation Age of Onset  . Goiter Mother   . Heart disease Mother   . Thyroid disease Mother   . Graves' disease Sister   . Asthma Sister   . Cancer Father   . Heart disease Father   . Goiter Other        Siblings 2  . Hypertension Paternal Grandfather   . Depression Neg Hx   . Alcohol abuse Neg Hx   . Drug abuse Neg Hx   . Early death Neg Hx   . Hearing loss Neg Hx   . Kidney disease Neg Hx   .  Stroke Neg Hx     Social History   Tobacco Use  . Smoking status: Never Smoker  . Smokeless tobacco: Never Used  Substance Use Topics  . Alcohol use: No    Comment: occ    Subjective:  Left lower extremity pain x 1 week; notes that she has been wearing her compression stockings recently and symptoms do seem to be improving; did injure her back at work prior to onset of symptoms- "twisted her back putting up dirty linens in a big bag;" works as a Quarry manager at [employer]; denies any redness/ swelling in the leg; notes that "entire leg" felt heavy- radiated from her left hip down into left calf; went to ER on 06/26/18 with headache, palpitations, tachycardia; was found to be dehydrated- has been feeling better;    Objective:  Vitals:   07/01/18 1009  BP: 124/80  Pulse: 80  Temp: 98.2 F  (36.8 C)  TempSrc: Oral  SpO2: 97%  Weight: 139 lb 3.2 oz (63.1 kg)  Height: 5\' 2"  (1.575 m)    General: Well developed, well nourished, in no acute distress  Skin : Warm and dry.  Head: Normocephalic and atraumatic  Lungs: Respirations unlabored; clear to auscultation bilaterally without wheeze, rales, rhonchi  CVS exam: normal rate and regular rhythm.  Musculoskeletal: No deformities; no active joint inflammation  Extremities: No edema, cyanosis, clubbing  Vessels: Symmetric bilaterally  Neurologic: Alert and oriented; speech intact; face symmetrical; moves all extremities well; CNII-XII intact without focal deficit   Assessment:  1. Acute left-sided low back pain with left-sided sciatica   2. Left leg pain     Plan:  Reassurance- symptoms do not appear c/w DVT; more consistent with sciatica; symptoms are improving; do not feel imaging warranted today; trial of Naproxen 500 mg bid prn ( patient can take this medication) and Flexeril 10 mg tid prn; apply heat/ rest; discuss injury with her supervisor; follow up worse, no better.   No follow-ups on file.  No orders of the defined types were placed in this encounter.   Requested Prescriptions   Signed Prescriptions Disp Refills  . cyclobenzaprine (FLEXERIL) 10 MG tablet 30 tablet 0    Sig: Take 1 tablet (10 mg total) by mouth 3 (three) times daily as needed for muscle spasms.  . naproxen (NAPROSYN) 500 MG tablet 30 tablet 0    Sig: Take 1 tablet (500 mg total) by mouth 2 (two) times daily with a meal.

## 2018-07-03 ENCOUNTER — Encounter: Payer: Self-pay | Admitting: Internal Medicine

## 2018-07-07 ENCOUNTER — Other Ambulatory Visit: Payer: Self-pay | Admitting: Internal Medicine

## 2018-07-07 DIAGNOSIS — F5104 Psychophysiologic insomnia: Secondary | ICD-10-CM | POA: Insufficient documentation

## 2018-07-07 DIAGNOSIS — R002 Palpitations: Secondary | ICD-10-CM | POA: Diagnosis not present

## 2018-07-07 DIAGNOSIS — G43009 Migraine without aura, not intractable, without status migrainosus: Secondary | ICD-10-CM | POA: Diagnosis not present

## 2018-07-07 DIAGNOSIS — I340 Nonrheumatic mitral (valve) insufficiency: Secondary | ICD-10-CM | POA: Diagnosis not present

## 2018-07-07 MED ORDER — TRAZODONE HCL 50 MG PO TABS: 50.0000 mg | ORAL_TABLET | Freq: Every day | ORAL | 1 refills | Status: DC

## 2018-07-07 MED FILL — PROPRANOLOL ER 80 MG CAP: 80 | 30 days supply | Qty: 30 | Fill #0

## 2018-07-07 MED FILL — traZODone HCL 50 MG TABS: 50 | 90 days supply | Qty: 90 | Fill #0

## 2018-07-09 ENCOUNTER — Other Ambulatory Visit (INDEPENDENT_AMBULATORY_CARE_PROVIDER_SITE_OTHER): Payer: 59

## 2018-07-09 ENCOUNTER — Ambulatory Visit: Payer: 59 | Admitting: Internal Medicine

## 2018-07-09 ENCOUNTER — Encounter: Payer: Self-pay | Admitting: Internal Medicine

## 2018-07-09 VITALS — BP 122/82 | HR 65 | Temp 97.8°F | Resp 16 | Ht 62.0 in | Wt 139.5 lb

## 2018-07-09 DIAGNOSIS — R739 Hyperglycemia, unspecified: Secondary | ICD-10-CM | POA: Diagnosis not present

## 2018-07-09 DIAGNOSIS — E039 Hypothyroidism, unspecified: Secondary | ICD-10-CM

## 2018-07-09 DIAGNOSIS — F5104 Psychophysiologic insomnia: Secondary | ICD-10-CM

## 2018-07-09 DIAGNOSIS — G43001 Migraine without aura, not intractable, with status migrainosus: Secondary | ICD-10-CM

## 2018-07-09 LAB — HEMOGLOBIN A1C: HEMOGLOBIN A1C: 5.9 % (ref 4.6–6.5)

## 2018-07-09 LAB — TSH: TSH: 5.06 u[IU]/mL — AB (ref 0.35–4.50)

## 2018-07-09 MED ORDER — LEVOTHYROXINE SODIUM 100 MCG PO TABS: 100.0000 ug | ORAL_TABLET | Freq: Every day | ORAL | 1 refills | Status: DC

## 2018-07-09 MED FILL — SYNTHROID 100 MCG TABLET: 100 | 90 days supply | Qty: 90 | Fill #0

## 2018-07-09 NOTE — Patient Instructions (Signed)
Hypothyroidism Hypothyroidism is a disorder of the thyroid. The thyroid is a large gland that is located in the lower front of the neck. The thyroid releases hormones that control how the body works. With hypothyroidism, the thyroid does not make enough of these hormones. What are the causes? Causes of hypothyroidism may include:  Viral infections.  Pregnancy.  Your own defense system (immune system) attacking your thyroid.  Certain medicines.  Birth defects.  Past radiation treatments to your head or neck.  Past treatment with radioactive iodine.  Past surgical removal of part or all of your thyroid.  Problems with the gland that is located in the center of your brain (pituitary).  What are the signs or symptoms? Signs and symptoms of hypothyroidism may include:  Feeling as though you have no energy (lethargy).  Inability to tolerate cold.  Weight gain that is not explained by a change in diet or exercise habits.  Dry skin.  Coarse hair.  Menstrual irregularity.  Slowing of thought processes.  Constipation.  Sadness or depression.  How is this diagnosed? Your health care provider may diagnose hypothyroidism with blood tests and ultrasound tests. How is this treated? Hypothyroidism is treated with medicine that replaces the hormones that your body does not make. After you begin treatment, it may take several weeks for symptoms to go away. Follow these instructions at home:  Take medicines only as directed by your health care provider.  If you start taking any new medicines, tell your health care provider.  Keep all follow-up visits as directed by your health care provider. This is important. As your condition improves, your dosage needs may change. You will need to have blood tests regularly so that your health care provider can watch your condition. Contact a health care provider if:  Your symptoms do not get better with treatment.  You are taking thyroid  replacement medicine and: ? You sweat excessively. ? You have tremors. ? You feel anxious. ? You lose weight rapidly. ? You cannot tolerate heat. ? You have emotional swings. ? You have diarrhea. ? You feel weak. Get help right away if:  You develop chest pain.  You develop an irregular heartbeat.  You develop a rapid heartbeat. This information is not intended to replace advice given to you by your health care provider. Make sure you discuss any questions you have with your health care provider. Document Released: 08/27/2005 Document Revised: 02/02/2016 Document Reviewed: 01/12/2014 Elsevier Interactive Patient Education  2018 [employer]  

## 2018-07-09 NOTE — Progress Notes (Signed)
NEUROLOGY CONSULTATION NOTE  Maiyah Goyne MRN: [id number] DOB: 19-Nov-1965  Referring provider: Aletta Edouard, MD (ED referral) Primary care provider: [name], MD  Reason for consult:  headaches  HISTORY OF PRESENT ILLNESS: Chelsey Hunt is a 52 year old right-handed female with hypothyroidism who presents for headaches.  History supplemented by ED note.  Onset:  First migraine was between 65 and 2 years old.  Migraine-free until 2004 during her menstrual cycle.   Location:  Left more than right. Quality:  throbbing Intensity:  Severe.   Aura:  no Prodrome:  no Postdrome:  fatigue Associated symptoms:  Right facial tingling, nausea, photophobia, phonophobia, osmophobia.  She denies associated vomiting, visual disturbance or unilateral numbness or weakness. Duration:  3 days Frequency:  daily Frequency of abortive medication: Advil or Tylenol daily, Maxalt 2 days a week Triggers:  Certain smells, sausages, bananas Relieving factors:  exercise Activity:  aggravates Over past 3 months, she has had throbbing occipital headache associated with left facial numbness, facial twitching.  She started having a panic attack with palpitations.  She went to the ED on 06/26/18 for symptoms.  EKG showed sinus tachycardia.  She was treated with headache cocktail  Current NSAIDS:  Advil Current analgesics: Tylenol  Current triptans: Maxalt 10 mg Current ergotamine: None Current anti-emetic: None Current muscle relaxants: Cyclobenzaprine 10 mg (back pain) Current anti-anxiolytic: None Current sleep aide: None Current Antihypertensive medications: None Current Antidepressant medications: None Current Anticonvulsant medications: None Current anti-CGRP: None Current Vitamins/Herbal/Supplements:  Magnesium Current Antihistamines/Decongestants: None Other therapy:  none Hormone/birth control: Estradiol  Past NSAIDS: Ibuprofen, naproxen Past analgesics:  Excedrin, Lidocaine  nasal Past abortive triptans: Relpax, sumatriptan Past abortive ergotamine:  none Past muscle relaxants:  none Past anti-emetic:  none Past antihypertensive medications: Verapamil Past antidepressant medications: Imipramine Past anticonvulsant medications:  topiramate Past anti-CGRP:  Aimovig (aggravated migraine) Past vitamins/Herbal/Supplements: Riboflavin, magnesium Past antihistamines/decongestants:  None Other past therapies:  acupuncture  Caffeine:  Stopped coffee on 10/17 Diet:  5 to 6 bottles water daily Exercise:  Not routine Depression:  no; Anxiety:  Yes.  Feels agitated Other pain:  Back pain Sleep hygiene:  Poor.  Works third shift.  Prescribed trazodone but has not tried it yet. 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  06/26/18:  CMP with Na 138, K 3.9, Cl 101, CO2 27, glucose 107, BUN 11, Cr 1.01, t bili 0.3, ALP 70, AST 28, ALT 25; CBC with WBC 6.1, HGB 13.3, HCT 41.1, PLT 417.  PAST MEDICAL HISTORY: Past Medical History:  Diagnosis Date  . ACID REFLUX DISEASE 01/03/2008  . Anemia   . ANEMIA, IRON DEFICIENCY 12/23/2008   history  . Chronic tension type headache 01/13/2009   migraines  . Complication of anesthesia    pt states she just needs a small amount or she will sleep too long  . Dysplasia of cervix, low grade (CIN 1)   . Fibroid   . GERD (gastroesophageal reflux disease)   . HEART MURMUR, SYSTOLIC 97/98/9211   will have echo 06/18/2013  . 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 Irritable bowel syndrome 12/23/2008  . Migraine   . Ovarian cyst   . Ovarian cyst   . THYROID NODULE, RIGHT 04/21/2009    PAST SURGICAL HISTORY: Past Surgical History:  Procedure Laterality Date  .  ABDOMINAL HYSTERECTOMY      . COLONOSCOPY    . HYSTEROSCOPY    . LAPAROSCOPY    . NOVASURE ABLATION    . POLYPECTOMY    . TUBAL LIGATION    . UPPER GI ENDOSCOPY    . VAGINAL HYSTERECTOMY N/A 06/24/2013   Procedure: TOTAL HYSTERECTOMY VAGINAL;  Surgeon: Alwyn Pea, MD;  Location: [facility];  Service: Gynecology;  Laterality: N/A;    MEDICATIONS: Current Outpatient Medications on File Prior to Visit  Medication Sig Dispense Refill  . acetaminophen (TYLENOL) 325 MG tablet Take 650 mg by mouth every 6 (six) hours as needed for mild pain.    . cyclobenzaprine (FLEXERIL) 10 MG tablet Take 1 tablet (10 mg total) by mouth 3 (three) times daily as needed for muscle spasms. 30 tablet 0  . estradiol (ESTRACE) 2 MG tablet Take 2 mg by mouth daily.     Marland Kitchen levothyroxine (SYNTHROID, LEVOTHROID) 88 MCG tablet Take 1 tablet (88 mcg total) by mouth daily. 90 tablet 0  . pantoprazole (PROTONIX) 40 MG tablet Take 40 mg by mouth 2 (two) times daily.    . rizatriptan (MAXALT) 10 MG tablet Take 1 tablet (10 mg total) by mouth as needed for migraine. May repeat in 2 hours if needed 10 tablet 0   No current facility-administered medications on file prior to visit.     ALLERGIES: Allergies  Allergen Reactions  . Propoxyphene N-Acetaminophen   . Nabumetone Rash    REACTION: GI upset  . Topiramate Rash    REACTION: rash    FAMILY HISTORY: Family History  Problem Relation Age of Onset  . Goiter Mother   . Heart disease Mother   . Thyroid disease Mother   . Graves' disease Sister   . Asthma Sister   . Cancer Father   . Heart disease Father   . Goiter Other        Siblings 2  . Hypertension Paternal Grandfather   . Depression Neg Hx   . Alcohol abuse Neg Hx   . Drug abuse Neg Hx   . Early death Neg Hx   . Hearing loss Neg Hx   . Kidney disease Neg Hx   . Stroke Neg Hx    SOCIAL HISTORY: Social History   Socioeconomic History  . Marital status: Married    Spouse name: Not on file  . Number of children: Not on  file  . Years of education: Not on file  . Highest education level: Not on file  Occupational History  . Not on file  Social Needs  . Financial resource strain: Not on file  . Food insecurity:    Worry: Not on file    Inability: Not on file  . Transportation needs:    Medical: Not on file    Non-medical: Not on file  Tobacco Use  . Smoking status: Never Smoker  . Smokeless tobacco: Never Used  Substance and Sexual Activity  . Alcohol use: No    Comment: occ  . Drug use: No  . Sexual activity: Yes    Birth control/protection: Surgical    Comment: BTL  Lifestyle  . Physical activity:    Days per week: Not on file    Minutes per session: Not on file  . Stress: Not on file  Relationships  . Social connections:    Talks on phone: Not on file    Gets together: Not on file    Attends religious service: Not on  file    Active member of club or organization: Not on file    Attends meetings of clubs or organizations: Not on file    Relationship status: Not on file  . Intimate partner violence:    Fear of current or ex partner: Not on file    Emotionally abused: Not on file    Physically abused: Not on file    Forced sexual activity: Not on file  Other Topics Concern  . Not on file  Social History Narrative   Regular exercise-yes    REVIEW OF SYSTEMS: Constitutional: No fevers, chills, or sweats, no generalized fatigue, change in appetite Eyes: No visual changes, double vision, eye pain Ear, nose and throat: No hearing loss, ear pain, nasal congestion, sore throat Cardiovascular: No chest pain, palpitations Respiratory:  No shortness of breath at rest or with exertion, wheezes GastrointestinaI: No nausea, vomiting, diarrhea, abdominal pain, fecal incontinence Genitourinary:  No dysuria, urinary retention or frequency Musculoskeletal:  No neck pain, back pain Integumentary: No rash, pruritus, skin lesions Neurological: as above Psychiatric: No depression, insomnia,  anxiety Endocrine: No palpitations, fatigue, diaphoresis, mood swings, change in appetite, change in weight, increased thirst Hematologic/Lymphatic:  No purpura, petechiae. Allergic/Immunologic: no itchy/runny eyes, nasal congestion, recent allergic reactions, rashes  PHYSICAL EXAM: Blood pressure 108/64, pulse 100, height 5' (1.524 m), weight 139 lb (63 kg), last menstrual period 04/20/2013, SpO2 100 %. General: No acute distress.  Patient appears well-groomed.  Head:  Normocephalic/atraumatic Eyes:  fundi examined but not visualized Neck: supple, no paraspinal tenderness, full range of motion Back: No paraspinal tenderness Heart: regular rate and rhythm Lungs: Clear to auscultation bilaterally. Vascular: No carotid bruits. Neurological Exam: Mental status: alert and oriented to person, place, and time, recent and remote memory intact, fund of knowledge intact, attention and concentration intact, speech fluent and not dysarthric, language intact. Cranial nerves: CN I: not tested CN II: pupils equal, round and reactive to light, visual fields intact CN III, IV, VI:  full range of motion, no nystagmus, no ptosis CN V: facial sensation intact CN VII: upper and lower face symmetric CN VIII: hearing intact CN IX, X: gag intact, uvula midline CN XI: sternocleidomastoid and trapezius muscles intact CN XII: tongue midline Bulk & Tone: normal, no fasciculations. Motor:  5/5 throughout  Sensation:  temperature and vibration sensation intact. Deep Tendon Reflexes:  2+ throughout, toes downgoing.  Finger to nose testing:  Without dysmetria.  Heel to shin:  Without dysmetria.  Gait:  Normal station and stride.  Romberg negative.  IMPRESSION: Chronic migraine without aura, without status migrainosus, not intractable  PLAN: 1.  She would like a medication with the least possible systemic side effects.  For preventative management, we will submit preauthorization for Botox.  She has chronic  migraines (at least 3 consecutive months of daily headache, and failed multiple preventative medication). 2. She will stop Maxalt.  For abortive therapy, she will try Migranal 3.  Limit use of pain relievers to no more than 2 days out of week to prevent risk of rebound or medication-overuse headache. 4.  Keep headache diary 5.  Exercise, hydration, caffeine cessation, sleep hygiene, monitor for and avoid triggers 6.  Consider:  magnesium citrate 400mg  daily, riboflavin 400mg  daily, and coenzyme Q10 100mg  three times daily 7.  Follow up in 3 to 4 months.   Thank you for allowing me to take part in the care of this patient.  Metta Clines, DO  CC:  [name], MD

## 2018-07-09 NOTE — Progress Notes (Signed)
Subjective:  Patient ID: Chelsey Hunt, female    DOB: 1965/11/03  Age: 52 y.o. MRN: [id number]  CC: Hypothyroidism   HPI Chelsey Hunt presents for f/up - She was recently seen in the ED for headache and shaking.  She tells me that shaking is a new symptom for her so she has been referred to neurology.  She otherwise feels well and offers no complaints today.  Outpatient Medications Prior to Visit  Medication Sig Dispense Refill  . acetaminophen (TYLENOL) 325 MG tablet Take 650 mg by mouth every 6 (six) hours as needed for mild pain.    . cyclobenzaprine (FLEXERIL) 10 MG tablet Take 1 tablet (10 mg total) by mouth 3 (three) times daily as needed for muscle spasms. 30 tablet 0  . estradiol (ESTRACE) 2 MG tablet Take 2 mg by mouth daily.     . pantoprazole (PROTONIX) 40 MG tablet Take 40 mg by mouth 2 (two) times daily.    . rizatriptan (MAXALT) 10 MG tablet Take 1 tablet (10 mg total) by mouth as needed for migraine. May repeat in 2 hours if needed 10 tablet 0  . levothyroxine (SYNTHROID, LEVOTHROID) 88 MCG tablet Take 1 tablet (88 mcg total) by mouth daily. 90 tablet 0  . naproxen (NAPROSYN) 500 MG tablet Take 1 tablet (500 mg total) by mouth 2 (two) times daily with a meal. 30 tablet 0  . traZODone (DESYREL) 50 MG tablet Take 1 tablet (50 mg total) by mouth at bedtime. 90 tablet 1   No facility-administered medications prior to visit.     ROS Review of Systems  Constitutional: Negative for diaphoresis, fatigue and unexpected weight change.  HENT: Negative.   Respiratory: Negative.  Negative for cough, chest tightness, shortness of breath and wheezing.   Cardiovascular: Negative for chest pain, palpitations and leg swelling.  Gastrointestinal: Negative for abdominal pain, constipation, diarrhea, nausea and vomiting.  Endocrine: Negative for cold intolerance and heat intolerance.  Genitourinary: Negative.  Negative for difficulty urinating.  Musculoskeletal: Negative.   Negative for arthralgias and myalgias.  Skin: Negative for color change, pallor and rash.  Neurological: Positive for tremors and headaches. Negative for dizziness, weakness and numbness.  Hematological: Negative for adenopathy. Does not bruise/bleed easily.  Psychiatric/Behavioral: Negative.     Objective:  BP 122/82 (BP Location: Left Arm, Patient Position: Sitting, Cuff Size: Normal)   Pulse 65   Temp 97.8 F (36.6 C) (Oral)   Resp 16   Ht 5\' 2"  (1.575 m)   Wt 139 lb 8 oz (63.3 kg)   LMP 04/20/2013   SpO2 99%   BMI 25.51 kg/m   BP Readings from Last 3 Encounters:  07/10/18 108/64  07/09/18 122/82  07/01/18 124/80    Wt Readings from Last 3 Encounters:  07/10/18 139 lb (63 kg)  07/09/18 139 lb 8 oz (63.3 kg)  07/01/18 139 lb 3.2 oz (63.1 kg)    Physical Exam  Constitutional: She is oriented to person, place, and time. No distress.  HENT:  Mouth/Throat: Oropharynx is clear and moist. No oropharyngeal exudate.  Eyes: Conjunctivae are normal. No scleral icterus.  Neck: Normal range of motion. Neck supple. No JVD present. No thyromegaly present.  Cardiovascular: Normal rate, regular rhythm and normal heart sounds. Exam reveals no gallop.  No murmur heard. Pulmonary/Chest: Effort normal and breath sounds normal. No respiratory distress. She has no wheezes. She has no rales.  Abdominal: Soft. Bowel sounds are normal. She exhibits no mass. There is no hepatosplenomegaly.  There is no tenderness.  Musculoskeletal: Normal range of motion. She exhibits no edema, tenderness or deformity.  Lymphadenopathy:    She has no cervical adenopathy.  Neurological: She is alert and oriented to person, place, and time.  Skin: Skin is warm and dry. She is not diaphoretic. No pallor.  Vitals reviewed.   Lab Results  Component Value Date   WBC 6.1 06/26/2018   HGB 13.3 06/26/2018   HCT 41.1 06/26/2018   PLT 417 (H) 06/26/2018   GLUCOSE 107 (H) 06/26/2018   CHOL 259 (H) 02/26/2018    TRIG 153.0 (H) 02/26/2018   HDL 78.80 02/26/2018   LDLDIRECT 133.5 10/23/2013   LDLCALC 150 (H) 02/26/2018   ALT 25 06/26/2018   AST 28 06/26/2018   NA 138 06/26/2018   K 3.9 06/26/2018   CL 101 06/26/2018   CREATININE 1.01 (H) 06/26/2018   BUN 11 06/26/2018   CO2 27 06/26/2018   TSH 5.06 (H) 07/09/2018   HGBA1C 5.9 07/09/2018    Dg Chest 2 View  Result Date: 06/26/2018 CLINICAL DATA:  Shortness of breath and palpitations for the past week. EXAM: CHEST - 2 VIEW COMPARISON:  11/17/2015 FINDINGS: The heart size and mediastinal contours are within normal limits. Both lungs are clear. The visualized skeletal structures are unremarkable. IMPRESSION: No active cardiopulmonary disease. Electronically Signed   By: Lucienne Capers M.D.   On: 06/26/2018 05:21    Assessment & Plan:   Catharine was seen today for hypothyroidism.  Diagnoses and all orders for this visit:  Acquired hypothyroidism- Her TSH is slightly elevated so I have recommended an increase in her dose of levothyroxine. -     TSH; Future -     levothyroxine (SYNTHROID, LEVOTHROID) 100 MCG tablet; Take 1 tablet (100 mcg total) by mouth daily.  Hyperglycemia- Her A1c is up to 5.9%.  She has mild prediabetes.  Medical therapy is not indicated.  She was encouraged to improve her lifestyle modifications. -     Hemoglobin A1c; Future  Psychophysiological insomnia- She is not willing to take trazodone to treat this.  Migraine without aura and with status migrainosus, not intractable- She will see neurology about this.   I have discontinued Vanna Scotland levothyroxine, naproxen, and traZODone. I am also having her start on levothyroxine. Additionally, I am having her maintain her pantoprazole, estradiol, rizatriptan, acetaminophen, and cyclobenzaprine.  Meds ordered this encounter  Medications  . levothyroxine (SYNTHROID, LEVOTHROID) 100 MCG tablet    Sig: Take 1 tablet (100 mcg total) by mouth daily.    Dispense:   90 tablet    Refill:  1     Follow-up: Return in about 6 months (around 01/08/2019).  [name], MD

## 2018-07-10 ENCOUNTER — Encounter: Payer: Self-pay | Admitting: Neurology

## 2018-07-10 ENCOUNTER — Ambulatory Visit: Payer: 59 | Admitting: Neurology

## 2018-07-10 VITALS — BP 108/64 | HR 100 | Ht 60.0 in | Wt 139.0 lb

## 2018-07-10 DIAGNOSIS — G43709 Chronic migraine without aura, not intractable, without status migrainosus: Secondary | ICD-10-CM | POA: Diagnosis not present

## 2018-07-10 MED ORDER — DIHYDROERGOTAMINE MESYLATE 4 MG/ML NA SOLN: 1.0000 | NASAL | 12 refills | Status: DC | PRN

## 2018-07-10 NOTE — Patient Instructions (Signed)
Migraine Recommendations: 1.  We will submit pre-authorization for Botox. 2.  STOP MAXALT.  Take Migranal at earliest onset of headache.  1 spray in each nostril.  May repeat once in 15 minutes if needed.  No more than 2 days in a week.  If effective, contact me and I will send a prescription. 3.  Limit use of pain relievers to no more than 2 days out of the week.  These medications include acetaminophen, ibuprofen, triptans and narcotics.  This will help reduce risk of rebound headaches. 4.  Be aware of common food triggers such as processed sweets, processed foods with nitrites (such as deli meat, hot dogs, sausages), foods with MSG, alcohol (such as wine), chocolate, certain cheeses, certain fruits (dried fruits, bananas, pineapple), vinegar, diet soda. 4.  Avoid caffeine 5.  Routine exercise 6.  Proper sleep hygiene 7.  Stay adequately hydrated with water 8.  Keep a headache diary. 9.  Maintain proper stress management. 10.  Do not skip meals. 11.  Consider supplements:  Magnesium citrate 400mg  to 600mg  daily, riboflavin 400mg , Coenzyme Q 10 100mg  three times daily

## 2018-07-15 ENCOUNTER — Ambulatory Visit: Payer: 59 | Admitting: Internal Medicine

## 2018-07-15 DIAGNOSIS — N898 Other specified noninflammatory disorders of vagina: Secondary | ICD-10-CM | POA: Diagnosis not present

## 2018-07-15 DIAGNOSIS — K5900 Constipation, unspecified: Secondary | ICD-10-CM | POA: Diagnosis not present

## 2018-07-15 DIAGNOSIS — R102 Pelvic and perineal pain: Secondary | ICD-10-CM | POA: Diagnosis not present

## 2018-07-15 MED FILL — NYSTATIN-TRIAMCINOLONE OINT: 100000-0.1 | 15 days supply | Qty: 30 | Fill #0

## 2018-07-16 ENCOUNTER — Encounter: Payer: Self-pay | Admitting: *Deleted

## 2018-07-16 NOTE — Progress Notes (Signed)
No prior authorization required [facility] ref call # [id number] and Botox buy and bill is covered. Called and LMOVM.

## 2018-07-17 ENCOUNTER — Telehealth: Payer: Self-pay | Admitting: Neurology

## 2018-07-17 ENCOUNTER — Emergency Department (HOSPITAL_COMMUNITY)
Admission: EM | Admit: 2018-07-17 | Discharge: 2018-07-17 | Disposition: A | Discharge status: Home / Self Care | Payer: 59 | Attending: Emergency Medicine | Admitting: Emergency Medicine

## 2018-07-17 ENCOUNTER — Other Ambulatory Visit: Payer: Self-pay

## 2018-07-17 ENCOUNTER — Encounter (HOSPITAL_COMMUNITY): Payer: Self-pay | Admitting: *Deleted

## 2018-07-17 DIAGNOSIS — R000 Tachycardia, unspecified: Secondary | ICD-10-CM | POA: Diagnosis not present

## 2018-07-17 DIAGNOSIS — Z79899 Other long term (current) drug therapy: Secondary | ICD-10-CM | POA: Diagnosis not present

## 2018-07-17 DIAGNOSIS — R002 Palpitations: Secondary | ICD-10-CM | POA: Insufficient documentation

## 2018-07-17 DIAGNOSIS — E039 Hypothyroidism, unspecified: Secondary | ICD-10-CM | POA: Diagnosis not present

## 2018-07-17 LAB — CBC WITH DIFFERENTIAL/PLATELET
Abs Immature Granulocytes: 0.02 10*3/uL (ref 0.00–0.07)
Basophils Absolute: 0.1 10*3/uL (ref 0.0–0.1)
Basophils Relative: 1 %
EOS ABS: 0.2 10*3/uL (ref 0.0–0.5)
Eosinophils Relative: 3 %
HEMATOCRIT: 41.6 % (ref 36.0–46.0)
HEMOGLOBIN: 13.4 g/dL (ref 12.0–15.0)
IMMATURE GRANULOCYTES: 0 %
LYMPHS ABS: 3.6 10*3/uL (ref 0.7–4.0)
LYMPHS PCT: 51 %
MCH: 30 pg (ref 26.0–34.0)
MCHC: 32.2 g/dL (ref 30.0–36.0)
MCV: 93.1 fL (ref 80.0–100.0)
MONOS PCT: 6 %
Monocytes Absolute: 0.5 10*3/uL (ref 0.1–1.0)
NEUTROS ABS: 2.8 10*3/uL (ref 1.7–7.7)
NEUTROS PCT: 39 %
Platelets: 480 10*3/uL — ABNORMAL HIGH (ref 150–400)
RBC: 4.47 MIL/uL (ref 3.87–5.11)
RDW: 12.7 % (ref 11.5–15.5)
WBC: 7.2 10*3/uL (ref 4.0–10.5)
nRBC: 0 % (ref 0.0–0.2)

## 2018-07-17 LAB — I-STAT CHEM 8, ED
BUN: 11 mg/dL (ref 6–20)
CHLORIDE: 106 mmol/L (ref 98–111)
Calcium, Ion: 1.13 mmol/L — ABNORMAL LOW (ref 1.15–1.40)
Creatinine, Ser: 0.9 mg/dL (ref 0.44–1.00)
GLUCOSE: 114 mg/dL — AB (ref 70–99)
HEMATOCRIT: 41 % (ref 36.0–46.0)
Hemoglobin: 13.9 g/dL (ref 12.0–15.0)
POTASSIUM: 3.3 mmol/L — AB (ref 3.5–5.1)
Sodium: 141 mmol/L (ref 135–145)
TCO2: 26 mmol/L (ref 22–32)

## 2018-07-17 LAB — I-STAT TROPONIN, ED: Troponin i, poc: 0 ng/mL (ref 0.00–0.08)

## 2018-07-17 LAB — TSH: TSH: 2.808 u[IU]/mL (ref 0.350–4.500)

## 2018-07-17 MED ORDER — SODIUM CHLORIDE 0.9 % IV BOLUS
1000.0000 mL | Freq: Once | INTRAVENOUS | Status: AC
  Administered 2018-07-17: 1000 mL via INTRAVENOUS

## 2018-07-17 MED ORDER — METOPROLOL TARTRATE 5 MG/5ML IV SOLN
5.0000 mg | Freq: Once | INTRAVENOUS | Status: AC
  Administered 2018-07-17: 5 mg via INTRAVENOUS
  Filled 2018-07-17: qty 5

## 2018-07-17 MED FILL — ESTRADIOL 10 MCG TABS: 10 | 14 days supply | Qty: 14 | Fill #0

## 2018-07-17 NOTE — ED Provider Notes (Signed)
[facility] EMERGENCY DEPARTMENT Provider Note   CSN: [id number] Arrival date & time: 07/17/18  1028     History   Chief Complaint No chief complaint on file.   HPI Chelsey Hunt is a 52 y.o. female.  The history is provided by the patient and medical records. No language interpreter was used.     52 year old female with history of anemia, GERD, hypothyroidism presenting complaining of heart palpitation.  Patient report that 8 PM last night, she developed sensation of heart fluttering, along with bouts of lightheadedness, head pressure, and tingling sensation to her fingers and hands.  She went to sleep, woke up this morning with exact same symptom and decided to come here.  Symptoms moderate in severity, however still raising.  She denies any associated pain, no severe headache, chest pain, shortness of breath, productive cough, abdominal cramping, diaphoresis.  She denies any new medication changes and denies any increase caffeine intake.  Patient was last seen in the ED ED on 06/26/2018 for the exact same complaint.  She received IV fluid in the ED, subsequently discharged home to follow-up with her cardiologist and her neurologist.  She mentioned been seen by her cardiologist, and was placed on metoprolol but she has not been taking the medication due to concern of side effect.  She denies any increasing stress.  She denies any shortness of breath or having risk factor for PE.  She does take Synthroid for her thyroid disease but states she missed the last dose today.   Past Medical History:  Diagnosis Date  . ACID REFLUX DISEASE 01/03/2008  . Anemia   . ANEMIA, IRON DEFICIENCY 12/23/2008   history  . Chronic tension type headache 01/13/2009   migraines  . Complication of anesthesia    pt states she just needs a small amount or she will sleep too long  . Dysplasia of cervix, low grade (CIN 1)   . Fibroid   . GERD (gastroesophageal reflux disease)   . HEART  MURMUR, SYSTOLIC 09/18/3233   will have echo 06/18/2013  . 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 Irritable bowel syndrome 12/23/2008  . Migraine   . Ovarian cyst   . Ovarian cyst   . THYROID NODULE, RIGHT 04/21/2009    Patient Active Problem List   Diagnosis Date Noted  . Psychophysiological insomnia 07/07/2018  . Elevated platelet count 03/19/2018  . Seasonal allergic rhinitis due to pollen 08/08/2017  . Intractable migraine without aura and without status migrainosus 07/02/2016  . Hyperglycemia 11/28/2015  . Pancreas divisum of native pancreas 12/27/2014  . Routine general medical examination at a health care facility 07/30/2012  . GERD (gastroesophageal reflux disease)   . Migraine 07/29/2011  . Hypothyroidism 08/12/2009  . Irritable bowel syndrome 12/23/2008    Past Surgical History:  Procedure Laterality Date  . ABDOMINAL HYSTERECTOMY    . COLONOSCOPY    . HYSTEROSCOPY    . LAPAROSCOPY    . NOVASURE ABLATION    . POLYPECTOMY    . TUBAL LIGATION    . UPPER GI ENDOSCOPY    . VAGINAL HYSTERECTOMY N/A 06/24/2013   Procedure: TOTAL HYSTERECTOMY VAGINAL;  Surgeon: Alwyn Pea, MD;  Location: [facility];  Service: Gynecology;  Laterality: N/A;     OB History    Gravida  3   Para  3   Term      Preterm      AB      Living  3  SAB      TAB      Ectopic      Multiple      Live Births  3            Home Medications    Prior to Admission medications   Medication Sig Start Date End Date Taking? Authorizing Provider  acetaminophen (TYLENOL) 325 MG tablet Take 650 mg by mouth every 6 (six) hours as needed for mild pain.    [provider]  cyclobenzaprine (FLEXERIL) 10 MG tablet Take 1 tablet (10 mg total) by mouth 3 (three) times daily as needed for muscle spasms. 07/01/18   Marrian Salvage, FNP  dihydroergotamine (MIGRANAL) 4 MG/ML nasal spray Place 1 spray into the nose as needed for migraine. Use in one nostril as directed.   No more than 4 sprays in one hour 07/10/18   Tomi Likens, Adam R, DO  estradiol (ESTRACE) 2 MG tablet Take 2 mg by mouth daily.     [provider]  levothyroxine (SYNTHROID, LEVOTHROID) 100 MCG tablet Take 1 tablet (100 mcg total) by mouth daily. 07/09/18   Janith Lima, MD  pantoprazole (PROTONIX) 40 MG tablet Take 40 mg by mouth 2 (two) times daily.    [provider]  rizatriptan (MAXALT) 10 MG tablet Take 1 tablet (10 mg total) by mouth as needed for migraine. May repeat in 2 hours if needed 02/24/18   Janith Lima, MD    Family History Family History  Problem Relation Age of Onset  . Goiter Mother   . Heart disease Mother   . Thyroid disease Mother   . Heart attack Mother   . Graves' disease Sister   . Asthma Sister   . Cancer Father   . Heart disease Father   . Heart attack Father   . CVA Brother   . Hypertension Brother   . Hypertension Brother   . Alcoholism Brother   . Hepatitis Sister   . Stomach cancer Sister   . Goiter Other        Siblings 2  . Hypertension Paternal Grandfather   . Depression Neg Hx   . Alcohol abuse Neg Hx   . Drug abuse Neg Hx   . Early death Neg Hx   . Hearing loss Neg Hx   . Kidney disease Neg Hx   . Stroke Neg Hx     Social History Social History   Tobacco Use  . Smoking status: Never Smoker  . Smokeless tobacco: Never Used  Substance Use Topics  . Alcohol use: No    Comment: occ  . Drug use: No     Allergies   Propoxyphene n-acetaminophen; Nabumetone; and Topiramate   Review of Systems Review of Systems  All other systems reviewed and are negative.    Physical Exam Updated Vital Signs BP 133/85   Pulse 90   Resp (!) 21   Ht 5' (1.524 m)   Wt 63 kg   LMP 04/20/2013   SpO2 99%   BMI 27.15 kg/m   Physical Exam  Constitutional: She is oriented to person, place, and time. She appears well-developed and well-nourished. No distress.  HENT:  Head: Atraumatic.  Eyes: Conjunctivae are normal.    Neck: Neck supple. No thyromegaly present.  Cardiovascular:  Murmur heard. tachycardic  Pulmonary/Chest: Effort normal and breath sounds normal.  Abdominal: Soft. Bowel sounds are normal.  Musculoskeletal:  Mildly tremulous to bilateral hands  Neurological: She is alert and oriented  to person, place, and time.  Skin: No rash noted.  Psychiatric: She has a normal mood and affect.  Nursing note and vitals reviewed.    ED Treatments / Results  Labs (all labs ordered are listed, but only abnormal results are displayed) Labs Reviewed  CBC WITH DIFFERENTIAL/PLATELET - Abnormal; Notable for the following components:      Result Value   Platelets 480 (*)    All other components within normal limits  I-STAT CHEM 8, ED - Abnormal; Notable for the following components:   Potassium 3.3 (*)    Glucose, Bld 114 (*)    Calcium, Ion 1.13 (*)    All other components within normal limits  TSH  I-STAT TROPONIN, ED    EKG EKG Interpretation  Date/Time:  Thursday July 17 2018 10:38:21 EST Ventricular Rate:  119 PR Interval:    QRS Duration: 83 QT Interval:  308 QTC Calculation: 434 R Axis:   82 Text Interpretation:  Sinus tachycardia RSR' in V1 or V2, probably normal variant Borderline repolarization abnormality Baseline wander in lead(s) V1 V2 V3 V5 No significant change since last tracing Confirmed by Merrily Pew [phone]) on 07/17/2018 10:44:56 AM   Radiology No results found.  Procedures Procedures (including critical care time)  Medications Ordered in ED Medications  sodium chloride 0.9 % bolus 1,000 mL (0 mLs Intravenous Stopped 07/17/18 1222)  metoprolol tartrate (LOPRESSOR) injection 5 mg (5 mg Intravenous Given 07/17/18 1140)     Initial Impression / Assessment and Plan / ED Course  I have reviewed the triage vital signs and the nursing notes.  Pertinent labs & imaging results that were available during my care of the patient were reviewed by me and considered in  my medical decision making (see chart for details).     BP 133/85   Pulse 88   Resp (!) 24   Ht 5' (1.524 m)   Wt 63 kg   LMP 04/20/2013   SpO2 99%   BMI 27.15 kg/m    Final Clinical Impressions(s) / ED Diagnoses   Final diagnoses:  Heart palpitations    ED Discharge Orders    None     10:59 AM Patient here complaining of heart palpitation and generalized tremors along with lightheadedness.  Initial heart rate is 126, EKG shows sinus tachycardia without any concerning arrhythmia.  She does not look dehydrated.  She does have history of thyroid disease but has intact thyroid medication more than prescribed.  She denies any caffeine use.  He was recently placed on metoprolol for similar symptom but has not taken the medication yet.  She does not have any significant significant risk factor for PE, no complaints of shortness of breath.  Plan to obtain basic labs, check TSH, give IV fluid as well as metoprolol and will reassess.  Otherwise patient is stable.  1:06 PM Heart rate normalized after IVF and metoprolol.  Labs are reassuring.  Normal thyroid level.  Pt stable for discharge.  Encourage pt to take her metoprolol previously prescribed and to f/u with PCP for further care.  Return precaution discussed.    Domenic Moras, PA-C 07/17/18 1307    Mesner, Corene Cornea, MD 07/17/18 [phone]

## 2018-07-17 NOTE — ED Triage Notes (Signed)
Pt is here for palpitations which began around 8pm last night at rest.  Pt reports that she was having some lightheadedness with this.  No Cp, some anxiety and mild sob with this.  No focal weakness.  Pt HR on arrival is 130

## 2018-07-17 NOTE — Telephone Encounter (Signed)
Called Pt, LMOVM for her to rtrn my call

## 2018-07-17 NOTE — Discharge Instructions (Signed)
Please take your metoprolol as previously prescribed as it will help with your heart palpitation.  Call and follow up closely with your doctor for further management.  Your thyroid level is normal today.  Return if you have any concerns.

## 2018-07-17 NOTE — Telephone Encounter (Signed)
Patient is calling needing to discuss the issues with her head and shakiness. She also wants to discuss medications. Please call her back at [phone].Thanks!

## 2018-07-24 ENCOUNTER — Telehealth: Payer: Self-pay | Admitting: Neurology

## 2018-07-24 NOTE — Telephone Encounter (Signed)
Patient is calling in with a question regarding her next BOTOX appt . Please call her back at [phone]. Thanks!

## 2018-07-25 NOTE — Telephone Encounter (Signed)
Called and spoke with Pt. Cx 11/22 Botox appt, made appt for 11/27 to discuss Ajovy. Called Pt and advised her

## 2018-07-25 NOTE — Telephone Encounter (Signed)
Pt has decided she does not want to try Botox, she would like to consider one of the other anti-CGRP's, as Aimovig caused her headaches to worsen. She was afraid to try the Migranal, but will try it the next time she has a headache.   Per Dr. Tomi Likens, we will try Ajovy

## 2018-08-01 ENCOUNTER — Ambulatory Visit: Payer: 59 | Admitting: Neurology

## 2018-08-01 MED FILL — ESTRADIOL 2 MG TABLET: 2 | 30 days supply | Qty: 30 | Fill #8

## 2018-08-04 NOTE — Progress Notes (Addendum)
NEUROLOGY FOLLOW UP OFFICE NOTE  Darrien Laakso [id number]  HISTORY OF PRESENT ILLNESS: Chelsey Hunt is a 52 year old right-handed female with hypothyroidism who follows up for migraines.  UPDATE: She decided not to try the Botox.   Intensity:  severe Duration:  3 days Frequency:  daily Current NSAIDS: Advil Current analgesics: Tylenol Current triptans: No Current ergotamine: Migranal (not effective) Current anti-emetic: None Current muscle relaxants: Cyclobenzaprine 10 mg Current anti-anxiolytic: None Current sleep aide: None Current Antihypertensive medications: Propranolol ER 80mg  (she says it makes her feel dizzy) Current Antidepressant medications: None Current Anticonvulsant medications: None Current anti-CGRP: None Current Vitamins/Herbal/Supplements: None Current Antihistamines/Decongestants: None Other therapy: None  Caffeine: No Diet: 5-6 bottles of water daily Exercise: Not routine Depression: No; Anxiety: Yes Other pain: Pain Sleep hygiene: Poor  HISTORY:  Onset: First migraine was between 28 and 37 years old.  Migraine free until 2004 during her menstrual cycle.  Chronic for many years. Location:  Left more than right. Quality:  throbbing Initial intensity:  Severe.   Aura:  no Prodrome:  no Postdrome:  fatigue Associated symptoms: Right facial tingling, nausea, photophobia, phonophobia, osmophobia.  She denies associated vomiting, visual disturbance or unilateral numbness or weakness. Initial duration:  3 days Initial Frequency:  daily Initial Frequency of abortive medication: Advil or Tylenol daily, Maxalt 2 days a week Triggers: Certain smells, sausages, bananas Relieving factors:  exercise Activity:  aggravates Over past 3 months, she has had throbbing occipital headache associated with left facial numbness, facial twitching.  She started having a panic attack with palpitations.  She went to the ED on 06/26/18 for symptoms.  EKG showed  sinus tachycardia.  She was treated with headache cocktail  Past NSAIDS: Ibuprofen, naproxen Past analgesics:  Excedrin, Lidocaine nasal Past abortive triptans: Relpax, sumatriptan tablet, Maxalt Past abortive ergotamine:  none Past muscle relaxants:  none Past anti-emetic:  none Past antihypertensive medications: Verapamil Past antidepressant medications: Imipramine Past anticonvulsant medications:  topiramate Past anti-CGRP:  Aimovig (aggravated migraine) Past vitamins/Herbal/Supplements: Riboflavin, magnesium Past antihistamines/decongestants:  None Other past therapies:  acupuncture  Family history of headache:  Mom (migraines), daughter (migraines)  MRI and MRA of the head from 03/09/2009 was personally reviewed and revealed nonspecific small subcortical white matter hyperintensities and 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  PAST MEDICAL HISTORY: Past Medical History:  Diagnosis Date  . ACID REFLUX DISEASE 01/03/2008  . Anemia   . ANEMIA, IRON DEFICIENCY 12/23/2008   history  . Chronic tension type headache 01/13/2009   migraines  . Complication of anesthesia    pt states she just needs a small amount or she will sleep too long  . Dysplasia of cervix, low grade (CIN 1)   . Fibroid   . GERD (gastroesophageal reflux disease)   . HEART MURMUR, SYSTOLIC 29/93/7169   will have echo 06/18/2013  . 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 Irritable bowel syndrome 12/23/2008  . Migraine   . Ovarian cyst   . Ovarian cyst   . THYROID NODULE, RIGHT 04/21/2009    MEDICATIONS: Current Outpatient Medications on File Prior to Visit  Medication Sig Dispense Refill  . acetaminophen (TYLENOL) 325 MG tablet Take 650 mg by mouth every 6 (six) hours as needed for mild pain.    . cyclobenzaprine (FLEXERIL) 10 MG tablet Take 1 tablet (10 mg total) by mouth 3 (three) times daily as needed for muscle spasms. 30 tablet 0  . dihydroergotamine  (MIGRANAL) 4 MG/ML nasal spray Place  1 spray into the nose as needed for migraine. Use in one nostril as directed.  No more than 4 sprays in one hour 8 mL 12  . estradiol (ESTRACE) 2 MG tablet Take 2 mg by mouth daily.     Marland Kitchen levothyroxine (SYNTHROID, LEVOTHROID) 100 MCG tablet Take 1 tablet (100 mcg total) by mouth daily. 90 tablet 1  . pantoprazole (PROTONIX) 40 MG tablet Take 40 mg by mouth 2 (two) times daily.    . rizatriptan (MAXALT) 10 MG tablet Take 1 tablet (10 mg total) by mouth as needed for migraine. May repeat in 2 hours if needed 10 tablet 0   No current facility-administered medications on file prior to visit.     ALLERGIES: Allergies  Allergen Reactions  . Propoxyphene N-Acetaminophen   . Nabumetone Rash    REACTION: GI upset  . Topiramate Rash    REACTION: rash    FAMILY HISTORY: Family History  Problem Relation Age of Onset  . Goiter Mother   . Heart disease Mother   . Thyroid disease Mother   . Heart attack Mother   . Graves' disease Sister   . Asthma Sister   . Cancer Father   . Heart disease Father   . Heart attack Father   . CVA Brother   . Hypertension Brother   . Hypertension Brother   . Alcoholism Brother   . Hepatitis Sister   . Stomach cancer Sister   . Goiter Other        Siblings 2  . Hypertension Paternal Grandfather   . Depression Neg Hx   . Alcohol abuse Neg Hx   . Drug abuse Neg Hx   . Early death Neg Hx   . Hearing loss Neg Hx   . Kidney disease Neg Hx   . Stroke Neg Hx    SOCIAL HISTORY: Social History   Socioeconomic History  . Marital status: Married    Spouse name: Shanon Brow  . Number of children: 3  . Years of education: Not on file  . Highest education level: Associate degree: occupational, Hotel manager, or vocational program  Occupational History  . Occupation: WOMENS    Employer: [employer]  . Financial resource strain: Not on file  . Food insecurity:    Worry: Not on file    Inability: Not on file  .  Transportation needs:    Medical: Not on file    Non-medical: Not on file  Tobacco Use  . Smoking status: Never Smoker  . Smokeless tobacco: Never Used  Substance and Sexual Activity  . Alcohol use: No    Comment: occ  . Drug use: No  . Sexual activity: Yes    Birth control/protection: Surgical    Comment: BTL  Lifestyle  . Physical activity:    Days per week: Not on file    Minutes per session: Not on file  . Stress: Not on file  Relationships  . Social connections:    Talks on phone: Not on file    Gets together: Not on file    Attends religious service: Not on file    Active member of club or organization: Not on file    Attends meetings of clubs or organizations: Not on file    Relationship status: Not on file  . Intimate partner violence:    Fear of current or ex partner: Not on file    Emotionally abused: Not on file    Physically abused: Not on  file    Forced sexual activity: Not on file  Other Topics Concern  . Not on file  Social History Narrative   Regular exercise-yes      Patient is right-handed.She lives with her husband in a 1 story home. She has recently been avoiding caffeine. She has been unable to exercise lately due to a back injury.    REVIEW OF SYSTEMS: Constitutional: No fevers, chills, or sweats, no generalized fatigue, change in appetite Eyes: No visual changes, double vision, eye pain Ear, nose and throat: No hearing loss, ear pain, nasal congestion, sore throat Cardiovascular: No chest pain, palpitations Respiratory:  No shortness of breath at rest or with exertion, wheezes GastrointestinaI: No nausea, vomiting, diarrhea, abdominal pain, fecal incontinence Genitourinary:  No dysuria, urinary retention or frequency Musculoskeletal:  No neck pain, back pain Integumentary: No rash, pruritus, skin lesions Neurological: as above Psychiatric: No depression, insomnia, anxiety Endocrine: No palpitations, fatigue, diaphoresis, mood swings, change in  appetite, change in weight, increased thirst Hematologic/Lymphatic:  No purpura, petechiae. Allergic/Immunologic: no itchy/runny eyes, nasal congestion, recent allergic reactions, rashes  PHYSICAL EXAM: Blood pressure 100/70, pulse 76, height 5' (1.524 m), weight 138 lb 12.8 oz (63 kg), last menstrual period 04/20/2013, SpO2 97 %. General: No acute distress.  Patient appears well-groomed.  Head:  Normocephalic/atraumatic Eyes:  Fundi examined but not visualized Neck: supple, no paraspinal tenderness, full range of motion Heart:  Regular rate and rhythm Lungs:  Clear to auscultation bilaterally Back: No paraspinal tenderness Neurological Exam: alert and oriented to person, place, and time. Attention span and concentration intact, recent and remote memory intact, fund of knowledge intact.  Speech fluent and not dysarthric, language intact.  CN II-XII intact. Bulk and tone normal, muscle strength 5/5 throughout.  Sensation to light touch  intact.  Deep tendon reflexes 2+ throughout.  Finger to nose testing intact.  Gait normal, Romberg negative.  IMPRESSION: chronic migraine without aura, without status migrainosus,intractable  PLAN: 1.  She will stop propranolol.  For preventative management, Emgality 2.  She will stop Migranal.  For abortive therapy, Zembrace injection.   3. She may continue Phenergan for nausea and Flexeril for neck spasms 4.  May use Advil or Tylenol as well but must limit use of pain relievers to no more than 2 days out of week to prevent risk of rebound or medication-overuse headache. 5.  Keep headache diary 6.  Exercise, hydration, caffeine cessation, sleep hygiene, monitor for and avoid triggers 7.  Consider:  magnesium citrate 400mg  daily, riboflavin 400mg  daily, and coenzyme Q10 100mg  three times daily 8.  Follow up in 3 to 4 months.  Metta Clines, DO  CC: [name], MD

## 2018-08-06 ENCOUNTER — Encounter: Payer: Self-pay | Admitting: Neurology

## 2018-08-06 ENCOUNTER — Ambulatory Visit: Payer: 59 | Admitting: Neurology

## 2018-08-06 VITALS — BP 100/70 | HR 76 | Ht 60.0 in | Wt 138.8 lb

## 2018-08-06 DIAGNOSIS — G43719 Chronic migraine without aura, intractable, without status migrainosus: Secondary | ICD-10-CM | POA: Diagnosis not present

## 2018-08-06 MED ORDER — SUMATRIPTAN SUCCINATE 3 MG/0.5ML ~~LOC~~ SOAJ: 3.0000 mg | SUBCUTANEOUS | 0 refills | Status: DC

## 2018-08-06 MED ORDER — GALCANEZUMAB-GNLM 120 MG/ML ~~LOC~~ SOSY: 240.0000 mg | PREFILLED_SYRINGE | Freq: Once | SUBCUTANEOUS | 0 refills | Status: AC

## 2018-08-06 NOTE — Patient Instructions (Addendum)
1.  Stop propranolol  We will start Emgality 2.  Stop Migranal.  Instead when you get a migraine, take Zembrace injection at earliest onset.  May repeat dose once after one hour if needed.  No more than 2 doses in 24 hours.  You may still take phenergan and Flexeril. 3.  Limit use of pain relievers to no more than 2 days out of week to prevent risk of rebound or medication-overuse headache (this includes advil and tylenol)

## 2018-08-06 NOTE — Addendum Note (Signed)
Addended by: Clois Comber on: 08/06/2018 08:19 AM   Modules accepted: Orders

## 2018-08-11 ENCOUNTER — Encounter

## 2018-08-11 ENCOUNTER — Ambulatory Visit: Payer: 59 | Admitting: Neurology

## 2018-08-13 DIAGNOSIS — R102 Pelvic and perineal pain: Secondary | ICD-10-CM | POA: Diagnosis not present

## 2018-08-13 DIAGNOSIS — R1032 Left lower quadrant pain: Secondary | ICD-10-CM | POA: Diagnosis not present

## 2018-08-14 ENCOUNTER — Telehealth: Payer: Self-pay | Admitting: Neurology

## 2018-08-14 DIAGNOSIS — G43709 Chronic migraine without aura, not intractable, without status migrainosus: Secondary | ICD-10-CM

## 2018-08-14 NOTE — Telephone Encounter (Signed)
Patient is calling concerning the Zembrace injections. She has some questions. Please call her back at [phone]. Thanks!

## 2018-08-15 MED ORDER — SUMATRIPTAN SUCCINATE 3 MG/0.5ML ~~LOC~~ SOAJ: 3.0000 mg | SUBCUTANEOUS | 5 refills | Status: DC

## 2018-08-15 NOTE — Telephone Encounter (Signed)
Called Pt to advise Rx sent in

## 2018-08-15 NOTE — Telephone Encounter (Signed)
Patient is calling again needing an order for the [facility] because she is liking the medication. Please send a prescription to the [facility] outpatient Pharm. Please send!

## 2018-08-18 NOTE — Progress Notes (Signed)
PA initiated via [email or web address] for pt's  Zembrace SymTouch 3mg /0.65mL Auto-injectors

## 2018-08-20 ENCOUNTER — Telehealth: Payer: Self-pay | Admitting: Neurology

## 2018-08-20 ENCOUNTER — Other Ambulatory Visit: Payer: Self-pay

## 2018-08-20 DIAGNOSIS — G43709 Chronic migraine without aura, not intractable, without status migrainosus: Secondary | ICD-10-CM

## 2018-08-20 MED ORDER — GALCANEZUMAB-GNLM 120 MG/ML ~~LOC~~ SOSY: 120.0000 mg | PREFILLED_SYRINGE | SUBCUTANEOUS | 11 refills | Status: DC

## 2018-08-20 NOTE — Telephone Encounter (Signed)
Patient called regarding her Imgality shots? She said she uses [facility]. Thanks

## 2018-08-21 DIAGNOSIS — K219 Gastro-esophageal reflux disease without esophagitis: Secondary | ICD-10-CM | POA: Diagnosis not present

## 2018-08-22 NOTE — Progress Notes (Addendum)
**  LATE DOCUMENTATION**  Prior Authorization initiated jon 08/21/2018 via [email or web address] for pt's  Emgality 120 mg/mL syringes

## 2018-08-25 NOTE — Progress Notes (Signed)
Rcvd fax from [facility].   Emgality approved for loading dose of two 120 mg syringes 08/21/18 - 09/20/18  Also was included, approval for 5 fills 09/13/17 - 02/11/19 for one 120 mg syringe per 30 days

## 2018-08-25 NOTE — Progress Notes (Signed)
Received notice that pt's EMGALITY has been  Approved 08/21/2018 thru 02/11/2019

## 2018-08-25 NOTE — Progress Notes (Signed)
Received notice that pt's [facility] 3MG  syringe has been denied

## 2018-08-26 ENCOUNTER — Telehealth: Payer: Self-pay

## 2018-08-26 MED FILL — EMGALITY 120 MG/ML SOSY: 120 | 30 days supply | Qty: 1 | Fill #0

## 2018-08-26 NOTE — Telephone Encounter (Signed)
Pt called about Emgality approval, advised her approved. She states pharmacy does not have Rx. Also, she is out of Zembrace samples. Looks like Philippa Chester was denied. [facility], spoke with Anderson Malta. She had Rx on hold until PA was rcvd, advised her approved, she ran it while on phone with me. Called Pt and advised, and can pick up samples of Zembrace.

## 2018-08-29 DIAGNOSIS — Z01419 Encounter for gynecological examination (general) (routine) without abnormal findings: Secondary | ICD-10-CM | POA: Diagnosis not present

## 2018-08-29 DIAGNOSIS — Z1211 Encounter for screening for malignant neoplasm of colon: Secondary | ICD-10-CM | POA: Diagnosis not present

## 2018-08-29 DIAGNOSIS — N898 Other specified noninflammatory disorders of vagina: Secondary | ICD-10-CM | POA: Diagnosis not present

## 2018-08-29 DIAGNOSIS — Z1231 Encounter for screening mammogram for malignant neoplasm of breast: Secondary | ICD-10-CM | POA: Diagnosis not present

## 2018-08-29 DIAGNOSIS — Z79890 Hormone replacement therapy (postmenopausal): Secondary | ICD-10-CM | POA: Diagnosis not present

## 2018-08-29 DIAGNOSIS — Z6827 Body mass index (BMI) 27.0-27.9, adult: Secondary | ICD-10-CM | POA: Diagnosis not present

## 2018-08-29 MED FILL — TERCONAZOLE 80 MG SUPP: 80 | 3 days supply | Qty: 3 | Fill #0

## 2018-08-29 MED FILL — ESTRADIOL 0.1 MG/DAY PATCH: 0.1 | 84 days supply | Qty: 12 | Fill #0

## 2018-08-29 MED FILL — NYSTATIN-TRIAMCINOLONE OINT: 100000-0.1 | 10 days supply | Qty: 30 | Fill #0

## 2018-08-30 ENCOUNTER — Encounter (HOSPITAL_COMMUNITY): Payer: Self-pay

## 2018-08-30 ENCOUNTER — Ambulatory Visit (HOSPITAL_COMMUNITY)
Admission: EM | Admit: 2018-08-30 | Discharge: 2018-08-30 | Disposition: A | Discharge status: Home / Self Care | Payer: 59 | Attending: Family Medicine | Admitting: Family Medicine

## 2018-08-30 DIAGNOSIS — G43011 Migraine without aura, intractable, with status migrainosus: Secondary | ICD-10-CM | POA: Diagnosis not present

## 2018-08-30 MED ORDER — ONDANSETRON 4 MG PO TBDP
ORAL_TABLET | ORAL | Status: AC
  Filled 2018-08-30: qty 1

## 2018-08-30 MED ORDER — KETOROLAC TROMETHAMINE 60 MG/2ML IM SOLN
60.0000 mg | Freq: Once | INTRAMUSCULAR | Status: AC
  Administered 2018-08-30: 60 mg via INTRAMUSCULAR

## 2018-08-30 MED ORDER — ONDANSETRON 4 MG PO TBDP
4.0000 mg | ORAL_TABLET | Freq: Once | ORAL | Status: AC
  Administered 2018-08-30: 4 mg via ORAL

## 2018-08-30 MED ORDER — KETOROLAC TROMETHAMINE 60 MG/2ML IM SOLN
INTRAMUSCULAR | Status: AC
  Filled 2018-08-30: qty 2

## 2018-08-30 NOTE — ED Provider Notes (Signed)
[facility]    CSN: [id number] Arrival date & time: 08/30/18  1747     History   Chief Complaint Chief Complaint  Patient presents with  . Migraine    HPI Chelsey Hunt is a 52 y.o. female.   Who has been having persistent migraines  This week. She got worse yesterday and tried Zembrace which was her 3rd dose in 3 days. When she woke this am around 11 she still had a HA. So she took her last injection today and has not helped at all and this pm feels it is worse, and today the pain is located on her R parietal region. Has been nauseous. Has not taken anything. Has photosensitive and pain in R orbit. Pain level right now 6-7/10.      Past Medical History:  Diagnosis Date  . ACID REFLUX DISEASE 01/03/2008  . Anemia   . ANEMIA, IRON DEFICIENCY 12/23/2008   history  . Chronic tension type headache 01/13/2009   migraines  . Complication of anesthesia    pt states she just needs a small amount or she will sleep too long  . Dysplasia of cervix, low grade (CIN 1)   . Fibroid   . GERD (gastroesophageal reflux disease)   . HEART MURMUR, SYSTOLIC 35/36/1443   will have echo 06/18/2013  . 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 Irritable bowel syndrome 12/23/2008  . Migraine   . Ovarian cyst   . Ovarian cyst   . THYROID NODULE, RIGHT 04/21/2009    Patient Active Problem List   Diagnosis Date Noted  . Psychophysiological insomnia 07/07/2018  . Elevated platelet count 03/19/2018  . Seasonal allergic rhinitis due to pollen 08/08/2017  . Intractable migraine without aura and without status migrainosus 07/02/2016  . Hyperglycemia 11/28/2015  . Pancreas divisum of native pancreas 12/27/2014  . Routine general medical examination at a health care facility 07/30/2012  . GERD (gastroesophageal reflux disease)   . Migraine 07/29/2011  . Hypothyroidism 08/12/2009  . Irritable bowel syndrome 12/23/2008    Past Surgical History:  Procedure Laterality Date  .  ABDOMINAL HYSTERECTOMY    . COLONOSCOPY    . HYSTEROSCOPY    . LAPAROSCOPY    . NOVASURE ABLATION    . POLYPECTOMY    . TUBAL LIGATION    . UPPER GI ENDOSCOPY    . VAGINAL HYSTERECTOMY N/A 06/24/2013   Procedure: TOTAL HYSTERECTOMY VAGINAL;  Surgeon: Alwyn Pea, MD;  Location: [facility];  Service: Gynecology;  Laterality: N/A;    OB History    Gravida  3   Para  3   Term      Preterm      AB      Living  3     SAB      TAB      Ectopic      Multiple      Live Births  3            Home Medications    Prior to Admission medications   Medication Sig Start Date End Date Taking? Authorizing Provider  acetaminophen (TYLENOL) 325 MG tablet Take 650 mg by mouth every 6 (six) hours as needed for mild pain.    [provider]  cyclobenzaprine (FLEXERIL) 10 MG tablet Take 1 tablet (10 mg total) by mouth 3 (three) times daily as needed for muscle spasms. 07/01/18   Marrian Salvage, FNP  estradiol (ESTRACE) 2 MG tablet Take 2 mg by mouth daily.  [provider]  Galcanezumab-gnlm (EMGALITY) 120 MG/ML SOSY Inject 120 mg into the skin every 30 (thirty) days. 08/20/18   Pieter Partridge, DO  levothyroxine (SYNTHROID, LEVOTHROID) 100 MCG tablet Take 1 tablet (100 mcg total) by mouth daily. 07/09/18   Janith Lima, MD  pantoprazole (PROTONIX) 40 MG tablet Take 40 mg by mouth 2 (two) times daily.    [provider]  propranolol ER (INDERAL LA) 80 MG 24 hr capsule Take 80 mg by mouth daily.    [provider]  SUMAtriptan Succinate (ZEMBRACE SYMTOUCH) 3 MG/0.5ML SOAJ Inject 3 mg into the skin as directed. 08/06/18   Pieter Partridge, DO  SUMAtriptan Succinate (ZEMBRACE SYMTOUCH) 3 MG/0.5ML SOAJ Inject 3 mg into the skin as directed. May repeat in one hour if needed. No more than 2/24 08/15/18   Pieter Partridge, DO    Family History Family History  Problem Relation Age of Onset  . Goiter Mother   . Heart disease Mother   . Thyroid  disease Mother   . Heart attack Mother   . Graves' disease Sister   . Asthma Sister   . Cancer Father   . Heart disease Father   . Heart attack Father   . CVA Brother   . Hypertension Brother   . Hypertension Brother   . Alcoholism Brother   . Hepatitis Sister   . Stomach cancer Sister   . Goiter Other        Siblings 2  . Hypertension Paternal Grandfather   . Depression Neg Hx   . Alcohol abuse Neg Hx   . Drug abuse Neg Hx   . Early death Neg Hx   . Hearing loss Neg Hx   . Kidney disease Neg Hx   . Stroke Neg Hx     Social History Social History   Tobacco Use  . Smoking status: Never Smoker  . Smokeless tobacco: Never Used  Substance Use Topics  . Alcohol use: No    Comment: occ  . Drug use: No    Allergies   Propoxyphene n-acetaminophen; Nabumetone; and Topiramate  Review of Systems Review of Systems  Constitutional: Negative for chills, diaphoresis and fever.  HENT: Negative.   Eyes: Positive for photophobia. Negative for pain, discharge and visual disturbance.  Gastrointestinal: Positive for nausea. Negative for vomiting.  Musculoskeletal: Negative for arthralgias, gait problem, neck pain and neck stiffness.  Neurological: Positive for numbness. Negative for dizziness, facial asymmetry, speech difficulty and weakness.       On L face which is normal for her.   Psychiatric/Behavioral: Positive for sleep disturbance.       Works night shift and has trouble sleeping   Physical Exam Triage Vital Signs ED Triage Vitals  Enc Vitals Group     BP 08/30/18 1821 109/65     Pulse Rate 08/30/18 1821 81     Resp 08/30/18 1821 16     Temp 08/30/18 1821 98.2 F (36.8 C)     Temp Source 08/30/18 1821 Oral     SpO2 08/30/18 1821 100 %     Weight --      Height --      Head Circumference --      Peak Flow --      Pain Score 08/30/18 1825 8     Pain Loc --      Pain Edu? --      Excl. in [place]? --    No data found.  Updated Vital Signs BP 109/65 (BP Location:  Left Arm)   Pulse 81   Temp 98.2 F (36.8 C) (Oral)   Resp 16   LMP 04/20/2013   SpO2 100%   Visual Acuity Right Eye Distance:   Left Eye Distance:   Bilateral Distance:    Right Eye Near:   Left Eye Near:    Bilateral Near:     Physical Exam Vitals signs and nursing note reviewed.  Constitutional:      General: She is not in acute distress.    Appearance: Normal appearance. She is not ill-appearing, toxic-appearing or diaphoretic.  HENT:     Head: Normocephalic.     Right Ear: Tympanic membrane, ear canal and external ear normal.     Left Ear: Tympanic membrane, ear canal and external ear normal.     Nose: Nose normal. No congestion or rhinorrhea.     Mouth/Throat:     Mouth: Mucous membranes are moist.     Pharynx: No oropharyngeal exudate or posterior oropharyngeal erythema.  Eyes:     General: No scleral icterus.       Right eye: No discharge.        Left eye: No discharge.     Extraocular Movements: Extraocular movements intact.     Conjunctiva/sclera: Conjunctivae normal.     Pupils: Pupils are equal, round, and reactive to light.  Neck:     Musculoskeletal: Normal range of motion. No neck rigidity or muscular tenderness.  Cardiovascular:     Heart sounds: No murmur.  Pulmonary:     Effort: Pulmonary effort is normal.  Musculoskeletal: Normal range of motion.        General: No deformity.  Lymphadenopathy:     Cervical: No cervical adenopathy.  Skin:    General: Skin is warm and dry.  Neurological:     General: No focal deficit present.     Mental Status: She is alert and oriented to person, place, and time.     Cranial Nerves: No cranial nerve deficit.     Sensory: No sensory deficit.     Motor: No weakness.     Coordination: Coordination normal.     Gait: Gait normal.     Deep Tendon Reflexes: Reflexes normal.     Comments: Normal propioception  Psychiatric:        Mood and Affect: Mood normal.        Behavior: Behavior normal.        Thought  Content: Thought content normal.        Judgment: Judgment normal.    UC Treatments / Results  Labs (all labs ordered are listed, but only abnormal results are displayed) Labs Reviewed - No data to display  EKG None  Radiology No results found.  Procedures Procedures   Medications Ordered in UC Medications - No data to display  Initial Impression / Assessment and Plan / UC Course  I have reviewed the triage vital signs and the nursing notes. Persistent Migraine I Gave her Toradol 60 mg IM which she has responded in the past well and with no reactions. Pain level at discharge was 4/10 and  Nausea 90 % resolved.  I advised her to try Melatonin 3-10 mg with Magnesium at bed time to see if this would help her.  FU with neurologist prn.    Final Clinical Impressions(s) / UC Diagnoses   Final diagnoses:  None   Discharge Instructions   None    ED Prescriptions  None     Controlled Substance Prescriptions [place] Controlled Substance Registry consulted?    Shelby Mattocks, [place] 08/30/18 1919

## 2018-08-30 NOTE — ED Triage Notes (Signed)
Pt presents with ongoing migraine that is making her nausea, lightheaded and she photo sensitivity to light.

## 2018-08-30 NOTE — Discharge Instructions (Signed)
You may take Phenergan in 6 hours if you need it.  You can also try Melatonin and Magnesium at bed time to see if this may help your recurrent Has, a neurologist recommended this to one of my patients and he is responding great.

## 2018-09-02 ENCOUNTER — Telehealth: Payer: Self-pay | Admitting: Neurology

## 2018-09-02 NOTE — Telephone Encounter (Signed)
Called and spoke with patient and advised this was sent in to the pharmacy. After further investigation it looks like this was denied by insurance. Pharmacy made aware.

## 2018-09-02 NOTE — Telephone Encounter (Signed)
Patient changed her mind and will call the pharmacy

## 2018-09-02 NOTE — Telephone Encounter (Signed)
Spoke with patient and made her aware this medication denied by insurance. She will come pick up samples Thursday.

## 2018-09-02 NOTE — Telephone Encounter (Signed)
Patient called regarding her Imgality medication 1 x a month. She is asking about a medication for migraines in between? She said the pharmacy did not have order for Zembrace? Please Call. Thanks

## 2018-09-04 ENCOUNTER — Other Ambulatory Visit: Payer: Self-pay

## 2018-09-04 ENCOUNTER — Telehealth: Payer: Self-pay

## 2018-09-04 DIAGNOSIS — G43719 Chronic migraine without aura, intractable, without status migrainosus: Secondary | ICD-10-CM

## 2018-09-04 MED ORDER — PREDNISONE 10 MG (21) PO TBPK: ORAL_TABLET | ORAL | 0 refills | Status: DC

## 2018-09-04 MED FILL — predniSONE 10 MG TABS: 10 | 6 days supply | Qty: 21 | Fill #0

## 2018-09-04 NOTE — Telephone Encounter (Signed)
Pt called with hx of 5-6 day headache. She went to urgent care, rcvd Toradol injection, which did give some relief initially, but not completely. Pt has used all of her Zembrace samples.   Per Dr. Delice Lesch, ok to send in Prednisone taper.   Called Pt and advised her, of dosing 6,5,4,3,2,1, and also not to take NSAIDS while on Prednisone.

## 2018-09-08 ENCOUNTER — Telehealth: Payer: Self-pay | Admitting: Neurology

## 2018-09-08 NOTE — Telephone Encounter (Signed)
Patient stated that she is on the Prednisone medication and it making her blood pressure rise. The first time it was 208 over 123 and then 147 over 97. Please call her back at [phone]. Thanks!

## 2018-09-08 NOTE — Telephone Encounter (Signed)
Called and spoke with Pt. Asked her to recheck her BP, it is now 137/89. She has 2 tablets of prednisone to take today and one tomorrow. I advised her to rest and hydrate as much as possible and after completing prednisone contact us and we can try a N/S since Zembrace has been denied

## 2018-09-11 NOTE — Progress Notes (Deleted)
NEUROLOGY FOLLOW UP OFFICE NOTE  Chelsey Hunt [id number]  HISTORY OF PRESENT ILLNESS: Chelsey Hunt is a 53 year old right-handed female with hypothyroidism who follows up for migraines.  UPDATE: Intensity:  *** Duration:  *** Frequency:  *** Frequency of abortive medication: *** Current NSAIDS: Advil Current analgesics: Tylenol Current triptans: Zembrace-SymTouch injection Current ergotamine: None Current anti-emetic: Promethazine Current muscle relaxants: Cyclobenzaprine 10 mg Current anti-anxiolytic: None Current sleep aide: None Current Antihypertensive medications: None Current Antidepressant medications: None Current Anticonvulsant medications: None Current anti-CGRP: Emgality Current Vitamins/Herbal/Supplements: None Current Antihistamines/Decongestants: None Other therapy: None Hormone/birth control: None  Caffeine: No Diet: 5-6 bottles of water daily Exercise: Not routine Depression: No; Anxiety: Yes Other pain:  *** Sleep hygiene: Poor  HISTORY:  Onset: First migraine was between 78 and 63 years old.  Migraine free until 2004 during her menstrual cycle.  Chronic for many years. Location:Left more than right. Quality:throbbing Initial intensity:Severe.  Aura:no Prodrome:no Postdrome:fatigue Associated symptoms: Right facial tingling, nausea, photophobia, phonophobia, osmophobia.Shedenies associated vomiting, visual disturbance orunilateral numbness or weakness. Initial duration:3 days Initial Frequency:daily Initial Frequency of abortive medication:Advil or Tylenol daily, Maxalt 2 days a week Triggers: Certain smells, sausages, bananas Relieving factors:exercise Activity:aggravates During summer 2019, she has had throbbing occipital headache associated with left facial numbness, facial twitching. She started having a panic attack with palpitations. She went to the ED on 06/26/18 for symptoms. EKG showed sinus  tachycardia. She was treated with headache cocktail  Past NSAIDS:Ibuprofen, naproxen Past analgesics:Excedrin, Lidocaine nasal Past abortive triptans:Relpax, sumatriptan tablet, Maxalt Past abortive ergotamine:Migranal Past muscle relaxants:none Past anti-emetic:none Past antihypertensive medications:Verapamil, propranolol Past antidepressant medications:Imipramine Past anticonvulsant medications:topiramate Past anti-CGRP:Aimovig (aggravated migraine) Past vitamins/Herbal/Supplements:Riboflavin, magnesium Past antihistamines/decongestants:None Other past therapies:acupuncture  Family history of headache:Mom (migraines), daughter (migraines)  MRI and MRA of the head from 03/09/2009 was personally reviewed and revealed nonspecific small subcortical white matter hyperintensities and 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  PAST MEDICAL HISTORY: Past Medical History:  Diagnosis Date  . ACID REFLUX DISEASE 01/03/2008  . Anemia   . ANEMIA, IRON DEFICIENCY 12/23/2008   history  . Chronic tension type headache 01/13/2009   migraines  . Complication of anesthesia    pt states she just needs a small amount or she will sleep too long  . Dysplasia of cervix, low grade (CIN 1)   . Fibroid   . GERD (gastroesophageal reflux disease)   . HEART MURMUR, SYSTOLIC 58/52/7782   will have echo 06/18/2013  . 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 Irritable bowel syndrome 12/23/2008  . Migraine   . Ovarian cyst   . Ovarian cyst   . THYROID NODULE, RIGHT 04/21/2009    MEDICATIONS: Current Outpatient Medications on File Prior to Visit  Medication Sig Dispense Refill  . acetaminophen (TYLENOL) 325 MG tablet Take 650 mg by mouth every 6 (six) hours as needed for mild pain.    . cyclobenzaprine (FLEXERIL) 10 MG tablet Take 1 tablet (10 mg total) by mouth 3 (three) times daily as needed for muscle spasms. 30 tablet 0  . estradiol  (ESTRACE) 2 MG tablet Take 2 mg by mouth daily.     . Galcanezumab-gnlm (EMGALITY) 120 MG/ML SOSY Inject 120 mg into the skin every 30 (thirty) days. 1 Syringe 11  . levothyroxine (SYNTHROID, LEVOTHROID) 100 MCG tablet Take 1 tablet (100 mcg total) by mouth daily. 90 tablet 1  . pantoprazole (PROTONIX) 40 MG tablet Take 40 mg by mouth 2 (two) times daily.    Marland Kitchen  predniSONE (STERAPRED UNI-PAK 21 TAB) 10 MG (21) TBPK tablet As directed 21 tablet 0  . propranolol ER (INDERAL LA) 80 MG 24 hr capsule Take 80 mg by mouth daily.    . SUMAtriptan Succinate (ZEMBRACE SYMTOUCH) 3 MG/0.5ML SOAJ Inject 3 mg into the skin as directed. 2 pen 0  . SUMAtriptan Succinate (ZEMBRACE SYMTOUCH) 3 MG/0.5ML SOAJ Inject 3 mg into the skin as directed. May repeat in one hour if needed. No more than 2/24 9 pen 5   No current facility-administered medications on file prior to visit.     ALLERGIES: Allergies  Allergen Reactions  . Propoxyphene N-Acetaminophen   . Nabumetone Rash    REACTION: GI upset  . Topiramate Rash    REACTION: rash    FAMILY HISTORY: Family History  Problem Relation Age of Onset  . Goiter Mother   . Heart disease Mother   . Thyroid disease Mother   . Heart attack Mother   . Graves' disease Sister   . Asthma Sister   . Cancer Father   . Heart disease Father   . Heart attack Father   . CVA Brother   . Hypertension Brother   . Hypertension Brother   . Alcoholism Brother   . Hepatitis Sister   . Stomach cancer Sister   . Goiter Other        Siblings 2  . Hypertension Paternal Grandfather   . Depression Neg Hx   . Alcohol abuse Neg Hx   . Drug abuse Neg Hx   . Early death Neg Hx   . Hearing loss Neg Hx   . Kidney disease Neg Hx   . Stroke Neg Hx    ***.  SOCIAL HISTORY: Social History   Socioeconomic History  . Marital status: Married    Spouse name: Shanon Brow  . Number of children: 3  . Years of education: Not on file  . Highest education level: Associate degree:  occupational, Hotel manager, or vocational program  Occupational History  . Occupation: WOMENS    Employer: [employer]  . Financial resource strain: Not on file  . Food insecurity:    Worry: Not on file    Inability: Not on file  . Transportation needs:    Medical: Not on file    Non-medical: Not on file  Tobacco Use  . Smoking status: Never Smoker  . Smokeless tobacco: Never Used  Substance and Sexual Activity  . Alcohol use: No    Comment: occ  . Drug use: No  . Sexual activity: Yes    Birth control/protection: Surgical    Comment: BTL  Lifestyle  . Physical activity:    Days per week: Not on file    Minutes per session: Not on file  . Stress: Not on file  Relationships  . Social connections:    Talks on phone: Not on file    Gets together: Not on file    Attends religious service: Not on file    Active member of club or organization: Not on file    Attends meetings of clubs or organizations: Not on file    Relationship status: Not on file  . Intimate partner violence:    Fear of current or ex partner: Not on file    Emotionally abused: Not on file    Physically abused: Not on file    Forced sexual activity: Not on file  Other Topics Concern  . Not on file  Social History Narrative  Regular exercise-yes      Patient is right-handed.She lives with her husband in a 1 story home. She has recently been avoiding caffeine. She has been unable to exercise lately due to a back injury.    REVIEW OF SYSTEMS: Constitutional: No fevers, chills, or sweats, no generalized fatigue, change in appetite Eyes: No visual changes, double vision, eye pain Ear, nose and throat: No hearing loss, ear pain, nasal congestion, sore throat Cardiovascular: No chest pain, palpitations Respiratory:  No shortness of breath at rest or with exertion, wheezes GastrointestinaI: No nausea, vomiting, diarrhea, abdominal pain, fecal incontinence Genitourinary:  No dysuria, urinary  retention or frequency Musculoskeletal:  No neck pain, back pain Integumentary: No rash, pruritus, skin lesions Neurological: as above Psychiatric: No depression, insomnia, anxiety Endocrine: No palpitations, fatigue, diaphoresis, mood swings, change in appetite, change in weight, increased thirst Hematologic/Lymphatic:  No purpura, petechiae. Allergic/Immunologic: no itchy/runny eyes, nasal congestion, recent allergic reactions, rashes  PHYSICAL EXAM: *** General: No acute distress.  Patient appears ***-groomed.  *** body habitus. Head:  Normocephalic/atraumatic Eyes:  Fundi examined but not visualized Neck: supple, no paraspinal tenderness, full range of motion Heart:  Regular rate and rhythm Lungs:  Clear to auscultation bilaterally Back: No paraspinal tenderness Neurological Exam: alert and oriented to person, place, and time. Attention span and concentration intact, recent and remote memory intact, fund of knowledge intact.  Speech fluent and not dysarthric, language intact.  CN II-XII intact. Bulk and tone normal, muscle strength 5/5 throughout.  Sensation to light touch, temperature and vibration intact.  Deep tendon reflexes 2+ throughout, toes downgoing.  Finger to nose and heel to shin testing intact.  Gait normal, Romberg negative.  IMPRESSION: ***  PLAN: ***  Metta Clines, DO  CC: ***

## 2018-09-15 ENCOUNTER — Ambulatory Visit: Payer: 59 | Admitting: Neurology

## 2018-09-22 ENCOUNTER — Inpatient Hospital Stay (HOSPITAL_COMMUNITY)
Admission: AD | Admit: 2018-09-22 | Discharge: 2018-09-22 | Disposition: A | Discharge status: Home / Self Care | Payer: 59 | Attending: Obstetrics & Gynecology | Admitting: Obstetrics & Gynecology

## 2018-09-22 DIAGNOSIS — G43909 Migraine, unspecified, not intractable, without status migrainosus: Secondary | ICD-10-CM | POA: Insufficient documentation

## 2018-09-22 DIAGNOSIS — G43709 Chronic migraine without aura, not intractable, without status migrainosus: Secondary | ICD-10-CM

## 2018-09-22 NOTE — MAU Provider Note (Signed)
Ms. Taquanna Borras is a 53 y.o. G3P3 who presents to MAU today for migraine. She is coming from work on another unit and wanted medication to be able to go back to work.   BP (!) 121/54 (BP Location: Right Arm)   Pulse 85   Temp 97.6 F (36.4 C) (Oral)   Resp 18   Ht 5' (1.524 m)   Wt 62.6 kg   LMP 04/20/2013   BMI 26.95 kg/m  CONSTITUTIONAL: Well-developed, well-nourished female in no acute distress.  CARDIOVASCULAR: Normal heart rate noted RESPIRATORY: Effort and breath sounds normal PSYCHIATRIC: Normal mood and affect. Normal behavior. Normal judgment and thought content.  MDM Medical screening exam complete Educated and discussed with patient options of medications for migraine, no medication other than Tylenol or NSAIDs will allow her to go back to work. Has only taken Advil while at work which helped slightly then returned. Patient has chronic migraines where she sees her neurologist for, has Flexeril at home. Encouraged patient to go home and take Flexeril and sleep off migraine.  Patient verbalizes understanding.   A:  Migraine  P: Discharge home Patient advised to take home medication and follow up with Neurologist this week  Patient may return to MAU as needed or if her condition were to change or worsen  Lajean Manes, CNM 09/22/2018 3:32 AM

## 2018-09-22 NOTE — MAU Note (Signed)
Pt here with c/o migraine since about 1300 yesterday, took some Advil at 1815 and then it got a little better for a while; worsened again about 2300.

## 2018-09-25 MED FILL — EMGALITY 120 MG/ML SOSY: 120 | 30 days supply | Qty: 1 | Fill #1

## 2018-09-25 MED FILL — PANTOPRAZOLE SOD DR 40 MG T: 40 | 90 days supply | Qty: 180 | Fill #0

## 2018-09-25 MED FILL — SYNTHROID 100 MCG TABLET: 100 | 90 days supply | Qty: 90 | Fill #1

## 2018-09-26 ENCOUNTER — Telehealth: Payer: Self-pay

## 2018-09-26 NOTE — Telephone Encounter (Signed)
Rcvd fax from [facility] o/p pharmacy concerning Emgality. Note on fax indicates reason for change  "Pt would lie to change to the pre filled PEN device vs syringe. If you approve, this new device will need new PA" Called pharmacy, spoke with Junie Panning. We discussed that Emgality only comes in a pre filled auto injector pen, though in Epic our only option when entering order for Rx it does indicate syringe. Erin checked and Pt picked up [facility] 12/17. She will contact the Pt as well as check with associate who sent the fax. I advised her Pt works 3rd shift at [employer] and may be sleeping.

## 2018-09-29 NOTE — Progress Notes (Signed)
NEUROLOGY FOLLOW UP OFFICE NOTE  Chelsey Hunt [id number]  HISTORY OF PRESENT ILLNESS: Chelsey Hunt is a 53 year old right-handed female with hypothyroidism who follows up for migraines.  She is accompanied by her husband who supplements history.  UPDATE: Zembrace wasn't covered.  Frequency improved but duration still prolonged.  She had a particularly severe one last night associated with dizziness.  She is going on a cruise this weekend for 7 days. Intensity:  Moderate to severe Duration:  3 days Frequency:  15 days a month Current NSAIDS: Advil Current analgesics: Tylenol Current triptans: None Current ergotamine: None Current anti-emetic: Promethazine Current muscle relaxants: Cyclobenzaprine 10 mg Current anti-anxiolytic: None Current sleep aide: None Current Antihypertensive medications: None Current Antidepressant medications: None Current Anticonvulsant medications: None Current anti-CGRP: Emgality Current Vitamins/Herbal/Supplements: None Current Antihistamines/Decongestants: None Other therapy: None Hormone/birth control: None  Caffeine: No Diet: 5-6 bottles of water daily Exercise: Not routine Depression: No; Anxiety: Yes Other pain:  no Sleep hygiene: Poor  HISTORY:  Onset: First migraine was between 69 and 48 years old.  Migraine free until 2004 during her menstrual cycle.  Chronic for many years. Location:Left more than right. Quality:throbbing Initial intensity:Severe.  Aura:no Prodrome:no Postdrome:fatigue Associated symptoms: Right facial tingling, nausea, photophobia, phonophobia, osmophobia.Shedenies associated vomiting, visual disturbance orunilateral numbness or weakness. Initial duration:3 days Initial Frequency:daily Initial Frequency of abortive medication:Advil or Tylenol daily, Maxalt 2 days a week Triggers: Certain smells, sausages, bananas Relieving factors:exercise Activity:aggravates During summer  2019, she has had throbbing occipital headache associated with left facial numbness, facial twitching. She started having a panic attack with palpitations. She went to the ED on 06/26/18 for symptoms. EKG showed sinus tachycardia. She was treated with headache cocktail  Past NSAIDS:Ibuprofen, naproxen Past analgesics:Excedrin, Lidocaine nasal Past abortive triptans:Relpax, sumatriptan tablet, Maxalt Past abortive ergotamine:Migranal Past muscle relaxants:none Past anti-emetic:none Past antihypertensive medications:Verapamil, propranolol Past antidepressant medications:Imipramine Past anticonvulsant medications:topiramate Past anti-CGRP:Aimovig (aggravated migraine) Past vitamins/Herbal/Supplements:Riboflavin, magnesium Past antihistamines/decongestants:None Other past therapies:acupuncture  Family history of headache:Mom (migraines), daughter (migraines)  MRI and MRA of the head from 03/09/2009 was personally reviewed and revealed nonspecific small subcortical white matter hyperintensities and 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  PAST MEDICAL HISTORY: Past Medical History:  Diagnosis Date  . ACID REFLUX DISEASE 01/03/2008  . Anemia   . ANEMIA, IRON DEFICIENCY 12/23/2008   history  . Chronic tension type headache 01/13/2009   migraines  . Complication of anesthesia    pt states she just needs a small amount or she will sleep too long  . Dysplasia of cervix, low grade (CIN 1)   . Fibroid   . GERD (gastroesophageal reflux disease)   . HEART MURMUR, SYSTOLIC 35/32/9924   will have echo 06/18/2013  . 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 Irritable bowel syndrome 12/23/2008  . Migraine   . Ovarian cyst   . Ovarian cyst   . THYROID NODULE, RIGHT 04/21/2009    MEDICATIONS: Current Outpatient Medications on File Prior to Visit  Medication Sig Dispense Refill  . acetaminophen (TYLENOL) 325 MG tablet Take  650 mg by mouth every 6 (six) hours as needed for mild pain.    . cyclobenzaprine (FLEXERIL) 10 MG tablet Take 1 tablet (10 mg total) by mouth 3 (three) times daily as needed for muscle spasms. 30 tablet 0  . estradiol (ESTRACE) 2 MG tablet Take 2 mg by mouth daily.     . Galcanezumab-gnlm (EMGALITY) 120 MG/ML SOSY Inject 120 mg into the skin  every 30 (thirty) days. 1 Syringe 11  . levothyroxine (SYNTHROID, LEVOTHROID) 100 MCG tablet Take 1 tablet (100 mcg total) by mouth daily. 90 tablet 1  . pantoprazole (PROTONIX) 40 MG tablet Take 40 mg by mouth 2 (two) times daily.    . predniSONE (STERAPRED UNI-PAK 21 TAB) 10 MG (21) TBPK tablet As directed 21 tablet 0  . propranolol ER (INDERAL LA) 80 MG 24 hr capsule Take 80 mg by mouth daily.    . SUMAtriptan Succinate (ZEMBRACE SYMTOUCH) 3 MG/0.5ML SOAJ Inject 3 mg into the skin as directed. 2 pen 0  . SUMAtriptan Succinate (ZEMBRACE SYMTOUCH) 3 MG/0.5ML SOAJ Inject 3 mg into the skin as directed. May repeat in one hour if needed. No more than 2/24 9 pen 5   No current facility-administered medications on file prior to visit.     ALLERGIES: Allergies  Allergen Reactions  . Propoxyphene N-Acetaminophen   . Nabumetone Rash    REACTION: GI upset  . Topiramate Rash    REACTION: rash    FAMILY HISTORY: Family History  Problem Relation Age of Onset  . Goiter Mother   . Heart disease Mother   . Thyroid disease Mother   . Heart attack Mother   . Graves' disease Sister   . Asthma Sister   . Cancer Father   . Heart disease Father   . Heart attack Father   . CVA Brother   . Hypertension Brother   . Hypertension Brother   . Alcoholism Brother   . Hepatitis Sister   . Stomach cancer Sister   . Goiter Other        Siblings 2  . Hypertension Paternal Grandfather   . Depression Neg Hx   . Alcohol abuse Neg Hx   . Drug abuse Neg Hx   . Early death Neg Hx   . Hearing loss Neg Hx   . Kidney disease Neg Hx   . Stroke Neg Hx     SOCIAL  HISTORY: Social History   Socioeconomic History  . Marital status: Married    Spouse name: Shanon Brow  . Number of children: 3  . Years of education: Not on file  . Highest education level: Associate degree: occupational, Hotel manager, or vocational program  Occupational History  . Occupation: WOMENS    Employer: [employer]  . Financial resource strain: Not on file  . Food insecurity:    Worry: Not on file    Inability: Not on file  . Transportation needs:    Medical: Not on file    Non-medical: Not on file  Tobacco Use  . Smoking status: Never Smoker  . Smokeless tobacco: Never Used  Substance and Sexual Activity  . Alcohol use: No    Comment: occ  . Drug use: No  . Sexual activity: Yes    Birth control/protection: Surgical    Comment: BTL  Lifestyle  . Physical activity:    Days per week: Not on file    Minutes per session: Not on file  . Stress: Not on file  Relationships  . Social connections:    Talks on phone: Not on file    Gets together: Not on file    Attends religious service: Not on file    Active member of club or organization: Not on file    Attends meetings of clubs or organizations: Not on file    Relationship status: Not on file  . Intimate partner violence:    Fear  of current or ex partner: Not on file    Emotionally abused: Not on file    Physically abused: Not on file    Forced sexual activity: Not on file  Other Topics Concern  . Not on file  Social History Narrative   Regular exercise-yes      Patient is right-handed.She lives with her husband in a 1 story home. She has recently been avoiding caffeine. She has been unable to exercise lately due to a back injury.    REVIEW OF SYSTEMS: Constitutional: No fevers, chills, or sweats, no generalized fatigue, change in appetite Eyes: No visual changes, double vision, eye pain Ear, nose and throat: No hearing loss, ear pain, nasal congestion, sore throat Cardiovascular: No chest pain,  palpitations Respiratory:  No shortness of breath at rest or with exertion, wheezes GastrointestinaI: No nausea, vomiting, diarrhea, abdominal pain, fecal incontinence Genitourinary:  No dysuria, urinary retention or frequency Musculoskeletal:  No neck pain, back pain Integumentary: No rash, pruritus, skin lesions Neurological: as above Psychiatric: No depression, insomnia, anxiety Endocrine: No palpitations, fatigue, diaphoresis, mood swings, change in appetite, change in weight, increased thirst Hematologic/Lymphatic:  No purpura, petechiae. Allergic/Immunologic: no itchy/runny eyes, nasal congestion, recent allergic reactions, rashes  PHYSICAL EXAM: Blood pressure 100/66, pulse 95, height 5' (1.524 m), weight 133 lb (60.3 kg), last menstrual period 04/20/2013, SpO2 98 %. General: No acute distress.  Patient appears well-groomed.  Head:  Normocephalic/atraumatic Eyes:  Fundi examined but not visualized Neck: supple, no paraspinal tenderness, full range of motion Heart:  Regular rate and rhythm Lungs:  Clear to auscultation bilaterally Back: No paraspinal tenderness Neurological Exam: alert and oriented to person, place, and time. Attention span and concentration intact, recent and remote memory intact, fund of knowledge intact.  Speech fluent and not dysarthric, language intact.  CN II-XII intact. Bulk and tone normal, muscle strength 5/5 throughout.  Sensation to light touch, temperature and vibration intact.  Deep tendon reflexes 2+ throughout, toes downgoing.  Finger to nose and heel to shin testing intact.  Gait normal, Romberg negative.  IMPRESSION: Chronic migraine without aura, without status migrainosus, not intractable.  Headache frequency has decreased by at least 50% with Emgality.  However, we need to find an effective abortive medication.  I prescribed Zembrace because she can tolerate an auto-injector.  Instead, we can try Tosymra nasal spray  PLAN: 1.  For preventative  management, she will continue Emgality. 2.  For abortive therapy, Tosymra nasal spray 3.  Limit use of pain relievers to no more than 2 days out of week to prevent risk of rebound or medication-overuse headache. 4.  Keep headache diary 5.  Exercise, hydration, caffeine cessation, sleep hygiene, monitor for and avoid triggers 6.  Consider:  magnesium citrate 400mg  daily, riboflavin 400mg  daily, and coenzyme Q10 100mg  three times daily 7.  Follow up in 4 months.   Metta Clines, DO  CC: [name], MD

## 2018-09-30 ENCOUNTER — Encounter: Payer: Self-pay | Admitting: Neurology

## 2018-09-30 ENCOUNTER — Ambulatory Visit: Payer: 59 | Admitting: Neurology

## 2018-09-30 ENCOUNTER — Telehealth: Payer: Self-pay | Admitting: Neurology

## 2018-09-30 VITALS — BP 100/66 | HR 95 | Ht 60.0 in | Wt 133.0 lb

## 2018-09-30 DIAGNOSIS — G43709 Chronic migraine without aura, not intractable, without status migrainosus: Secondary | ICD-10-CM

## 2018-09-30 MED ORDER — ONDANSETRON 4 MG PO TBDP: 4.0000 mg | ORAL_TABLET | Freq: Three times a day (TID) | ORAL | 0 refills | Status: DC | PRN

## 2018-09-30 MED ORDER — SUMATRIPTAN 10 MG/ACT NA SOLN: 10.0000 mg | NASAL | 3 refills | Status: DC

## 2018-09-30 MED ORDER — SUMATRIPTAN 10 MG/ACT NA SOLN: 10.0000 mg | NASAL | 0 refills | Status: DC

## 2018-09-30 MED FILL — ONDANSETRON ODT 4 MG TABLET: 4 | 7 days supply | Qty: 20 | Fill #0

## 2018-09-30 NOTE — Telephone Encounter (Signed)
Patient called about medication for nausea (non drowsy) before she goes on her cruise. She confirmed [facility] Pharm. Please call her back or send something to pharm. Thanks!

## 2018-09-30 NOTE — Telephone Encounter (Signed)
yes

## 2018-09-30 NOTE — Patient Instructions (Signed)
1.  For preventative management, continue Emgality 2.  For abortive therapy, we will give you the new sumatriptan nasal spray Tosymra.  I spray in one nostril at earliest onset of migraine.  May repeat once in 1 hour if needed. 3.  Limit use of pain relievers to no more than 2 days out of week to prevent risk of rebound or medication-overuse headache. 4.  Keep headache diary 5.  Exercise, hydration, caffeine cessation, sleep hygiene, monitor for and avoid triggers 6.  Consider:  magnesium citrate 400mg  daily, riboflavin 400mg  daily, and coenzyme Q10 100mg  three times daily 7.  Follow up in 4 months.  Have fun on your cruise!

## 2018-10-01 ENCOUNTER — Telehealth: Payer: Self-pay | Admitting: Neurology

## 2018-10-01 NOTE — Telephone Encounter (Signed)
Patient calling in about Medication: Tosymra and her insurance wont cover it. She wasnted to know if there was something else or if she needed to stay on that and use her card? Please call her back at [phone]. Thanks!

## 2018-10-02 ENCOUNTER — Other Ambulatory Visit: Payer: Self-pay

## 2018-10-02 MED ORDER — SUMATRIPTAN SUCCINATE 3 MG/0.5ML ~~LOC~~ SOAJ: 3.0000 mg | SUBCUTANEOUS | 3 refills | Status: DC

## 2018-10-02 NOTE — Telephone Encounter (Signed)
Patient has called back regarding Tosymra. Please Call. Thanks

## 2018-10-02 NOTE — Telephone Encounter (Signed)
Called and explained to Pt how the co-pay card works and to take it to the pharmacy and she will receive the medication for $0 copay

## 2018-10-06 NOTE — Progress Notes (Signed)
Subjective:    Patient ID: Chelsey Hunt, female    DOB: Dec 09, 1965, 53 y.o.   MRN: [id number]  HPI The patient is here for an acute visit.   Painful urination:  Her symptoms started yesterday.  She has dysuria and possibly some lower abdominal/pelvic pain.  She also had some left lower back pain.  She denies any fever, chills, nausea, increasing urinary frequency, blood in the urine, change in urine color or odor to her urine.  She did start drinking cranberry juice yesterday and she thinks that helped.  Her symptoms are better today.  She denies any vaginal discharge or vulvar itching.  She does have vaginal dryness related to menopause and wonders if that could have caused some of her symptoms.  She does notice that she has pain with urination depending on if she is leaning forward or not.  The night prior to her symptoms starting she did make local her husband.     Medications and allergies reviewed with patient and updated if appropriate.  Patient Active Problem List   Diagnosis Date Noted  . Psychophysiological insomnia 07/07/2018  . Elevated platelet count 03/19/2018  . Seasonal allergic rhinitis due to pollen 08/08/2017  . Intractable migraine without aura and without status migrainosus 07/02/2016  . Hyperglycemia 11/28/2015  . Pancreas divisum of native pancreas 12/27/2014  . Routine general medical examination at a health care facility 07/30/2012  . GERD (gastroesophageal reflux disease)   . Migraine 07/29/2011  . Hypothyroidism 08/12/2009  . Irritable bowel syndrome 12/23/2008    Current Outpatient Medications on File Prior to Visit  Medication Sig Dispense Refill  . acetaminophen (TYLENOL) 325 MG tablet Take 650 mg by mouth every 6 (six) hours as needed for mild pain.    . cyclobenzaprine (FLEXERIL) 10 MG tablet Take 1 tablet (10 mg total) by mouth 3 (three) times daily as needed for muscle spasms. 30 tablet 0  . Galcanezumab-gnlm (EMGALITY) 120 MG/ML SOSY  Inject 120 mg into the skin every 30 (thirty) days. 1 Syringe 11  . ibuprofen (ADVIL,MOTRIN) 200 MG tablet Take 200 mg by mouth every 8 (eight) hours as needed.    Marland Kitchen levothyroxine (SYNTHROID, LEVOTHROID) 100 MCG tablet Take 1 tablet (100 mcg total) by mouth daily. 90 tablet 1  . ondansetron (ZOFRAN-ODT) 4 MG disintegrating tablet Take 1 tablet (4 mg total) by mouth every 8 (eight) hours as needed for nausea or vomiting. 20 tablet 0  . pantoprazole (PROTONIX) 40 MG tablet Take 40 mg by mouth 2 (two) times daily.    . SUMAtriptan (TOSYMRA) 10 MG/ACT SOLN Place 10 mg into the nose as directed. 2 each 0  . SUMAtriptan (TOSYMRA) 10 MG/ACT SOLN Place 10 mg into the nose as directed. 1 spray in nostril, may repeat in 1 hr PRN 9 each 3  . SUMAtriptan Succinate (ZEMBRACE SYMTOUCH) 3 MG/0.5ML SOAJ Inject 3 mg into the skin as directed. 2 pen 0  . SUMAtriptan Succinate (ZEMBRACE SYMTOUCH) 3 MG/0.5ML SOAJ Inject 3 mg into the skin as directed. May repeat in 1 hour if needed 5 pen 3   No current facility-administered medications on file prior to visit.     Past Medical History:  Diagnosis Date  . ACID REFLUX DISEASE 01/03/2008  . Anemia   . ANEMIA, IRON DEFICIENCY 12/23/2008   history  . Chronic tension type headache 01/13/2009   migraines  . Complication of anesthesia    pt states she just needs a small amount or she will  sleep too long  . Dysplasia of cervix, low grade (CIN 1)   . Fibroid   . GERD (gastroesophageal reflux disease)   . HEART MURMUR, SYSTOLIC 16/06/9603   will have echo 06/18/2013  . 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 Irritable bowel syndrome 12/23/2008  . Migraine   . Ovarian cyst   . Ovarian cyst   . THYROID NODULE, RIGHT 04/21/2009    Past Surgical History:  Procedure Laterality Date  . ABDOMINAL HYSTERECTOMY    . COLONOSCOPY    . HYSTEROSCOPY    . LAPAROSCOPY    . NOVASURE ABLATION    . POLYPECTOMY    . TUBAL LIGATION    . UPPER GI ENDOSCOPY    . VAGINAL  HYSTERECTOMY N/A 06/24/2013   Procedure: TOTAL HYSTERECTOMY VAGINAL;  Surgeon: Alwyn Pea, MD;  Location: [facility];  Service: Gynecology;  Laterality: N/A;    Social History   Socioeconomic History  . Marital status: Married    Spouse name: Shanon Brow  . Number of children: 3  . Years of education: Not on file  . Highest education level: Associate degree: occupational, Hotel manager, or vocational program  Occupational History  . Occupation: WOMENS    Employer: [employer]  . Financial resource strain: Not on file  . Food insecurity:    Worry: Not on file    Inability: Not on file  . Transportation needs:    Medical: Not on file    Non-medical: Not on file  Tobacco Use  . Smoking status: Never Smoker  . Smokeless tobacco: Never Used  Substance and Sexual Activity  . Alcohol use: No    Comment: occ  . Drug use: No  . Sexual activity: Yes    Birth control/protection: Surgical    Comment: BTL  Lifestyle  . Physical activity:    Days per week: Not on file    Minutes per session: Not on file  . Stress: Not on file  Relationships  . Social connections:    Talks on phone: Not on file    Gets together: Not on file    Attends religious service: Not on file    Active member of club or organization: Not on file    Attends meetings of clubs or organizations: Not on file    Relationship status: Not on file  Other Topics Concern  . Not on file  Social History Narrative   Regular exercise-yes      Patient is right-handed.She lives with her husband in a 1 story home. She has recently been avoiding caffeine. She has been unable to exercise lately due to a back injury.    Family History  Problem Relation Age of Onset  . Goiter Mother   . Heart disease Mother   . Thyroid disease Mother   . Heart attack Mother   . Graves' disease Sister   . Asthma Sister   . Cancer Father   . Heart disease Father   . Heart attack Father   . CVA Brother   . Hypertension Brother     . Hypertension Brother   . Alcoholism Brother   . Hepatitis Sister   . Stomach cancer Sister   . Goiter Other        Siblings 2  . Hypertension Paternal Grandfather   . Depression Neg Hx   . Alcohol abuse Neg Hx   . Drug abuse Neg Hx   . Early death Neg Hx   . Hearing loss Neg  Hx   . Kidney disease Neg Hx   . Stroke Neg Hx     Review of Systems  Constitutional: Negative for chills and fever.  Gastrointestinal: Negative for nausea.  Genitourinary: Positive for dysuria and pelvic pain. Negative for frequency and hematuria.       No urine odor  Musculoskeletal: Positive for back pain (left side).       Objective:   Vitals:   10/07/18 1007  BP: 124/86  Pulse: 71  Resp: 16  Temp: 98.1 F (36.7 C)  SpO2: 99%   BP Readings from Last 3 Encounters:  10/07/18 124/86  09/30/18 100/66  09/22/18 (!) 121/54   Wt Readings from Last 3 Encounters:  10/07/18 133 lb (60.3 kg)  09/30/18 133 lb (60.3 kg)  09/22/18 138 lb (62.6 kg)   Body mass index is 25.97 kg/m.   Physical Exam Constitutional:      Appearance: Normal appearance.  Abdominal:     General: Abdomen is flat. There is no distension.     Palpations: Abdomen is soft.     Tenderness: There is no abdominal tenderness. There is no right CVA tenderness or left CVA tenderness.  Genitourinary:    Comments: Deferred Musculoskeletal:        General: No tenderness (No lower back tenderness).  Neurological:     Mental Status: She is alert.            Assessment & Plan:    See Problem List for Assessment and Plan of chronic medical problems.

## 2018-10-07 ENCOUNTER — Ambulatory Visit: Payer: 59 | Admitting: Internal Medicine

## 2018-10-07 ENCOUNTER — Encounter: Payer: Self-pay | Admitting: Internal Medicine

## 2018-10-07 ENCOUNTER — Other Ambulatory Visit: Payer: 59

## 2018-10-07 VITALS — BP 124/86 | HR 71 | Temp 98.1°F | Resp 16 | Ht 60.0 in | Wt 133.0 lb

## 2018-10-07 DIAGNOSIS — R300 Dysuria: Secondary | ICD-10-CM

## 2018-10-07 LAB — POCT URINALYSIS DIPSTICK
Bilirubin, UA: NEGATIVE
Blood, UA: NEGATIVE
Glucose, UA: NEGATIVE
Ketones, UA: NEGATIVE
Leukocytes, UA: NEGATIVE
Nitrite, UA: NEGATIVE
Protein, UA: NEGATIVE
Spec Grav, UA: 1.01 (ref 1.010–1.025)
Urobilinogen, UA: NEGATIVE E.U./dL — AB
pH, UA: 6 (ref 5.0–8.0)

## 2018-10-07 MED ORDER — NORTRIPTYLINE HCL 10 MG PO CAPS: 10.0000 mg | ORAL_CAPSULE | Freq: Every day | ORAL | 3 refills | Status: DC

## 2018-10-07 NOTE — Progress Notes (Signed)
Nortriptyline 10mg  at bedtime

## 2018-10-07 NOTE — Addendum Note (Signed)
Addended by: Clois Comber on: 10/07/2018 04:45 PM   Modules accepted: Orders

## 2018-10-07 NOTE — Assessment & Plan Note (Signed)
Urine dip not suggestive of UTI Will send for culture and call her with results Discussed that her symptoms are likely related to vaginal atrophy/dryness She will discuss with her gynecologist Discussed using coconut oil, vaginal estrogen Continue increased fluids Call if symptoms worsen

## 2018-10-07 NOTE — Patient Instructions (Signed)
Lets hold off on antibiotics for now. Continue increased fluids.   We will call you with the results of the urine culture.   Call if no improvement or with any questions.

## 2018-10-07 NOTE — Progress Notes (Signed)
Pt came in to the office for me to administer her Emgality injection. It is a pre filled syringe instead of an auto-injector.  [facility], changed to auto injector.  Pt also was given 3 samples of Cambia lot # [id number]. She will try and call back if effective and we will send Rx to specialty pharmacy.  Pt would like to try a medication that she can take at bedtime for preventative that will help her sleep also. The medications she takes now for abortive do help her headaches, but makes her so restless she feels they are not worth it.

## 2018-10-07 NOTE — Progress Notes (Signed)
Called and advised Pt 

## 2018-10-08 LAB — URINE CULTURE
MICRO NUMBER:: 115314
Result:: NO GROWTH
SPECIMEN QUALITY:: ADEQUATE

## 2018-10-13 ENCOUNTER — Other Ambulatory Visit: Payer: Self-pay

## 2018-10-13 DIAGNOSIS — G43709 Chronic migraine without aura, not intractable, without status migrainosus: Secondary | ICD-10-CM

## 2018-10-22 ENCOUNTER — Emergency Department (HOSPITAL_COMMUNITY)
Admission: EM | Admit: 2018-10-22 | Discharge: 2018-10-22 | Disposition: A | Discharge status: Home / Self Care | Payer: 59 | Attending: Emergency Medicine | Admitting: Emergency Medicine

## 2018-10-22 ENCOUNTER — Encounter (HOSPITAL_COMMUNITY): Payer: Self-pay | Admitting: *Deleted

## 2018-10-22 DIAGNOSIS — G43909 Migraine, unspecified, not intractable, without status migrainosus: Secondary | ICD-10-CM | POA: Diagnosis not present

## 2018-10-22 DIAGNOSIS — Z5321 Procedure and treatment not carried out due to patient leaving prior to being seen by health care provider: Secondary | ICD-10-CM | POA: Insufficient documentation

## 2018-10-22 NOTE — ED Notes (Signed)
Pt states she is leaving. Would rather be at home

## 2018-10-22 NOTE — ED Triage Notes (Signed)
Pt in c/o migraine since last night, history of same, reports the pain is stabbing at times, also c/o pain to her right side of her jaw and ear which is not typical, denies vision changes but reports photophobia and nausea which are typical for her, no distress noted

## 2018-10-24 ENCOUNTER — Ambulatory Visit: Payer: 59 | Admitting: Internal Medicine

## 2018-10-24 ENCOUNTER — Other Ambulatory Visit (INDEPENDENT_AMBULATORY_CARE_PROVIDER_SITE_OTHER): Payer: 59

## 2018-10-24 ENCOUNTER — Encounter: Payer: Self-pay | Admitting: Internal Medicine

## 2018-10-24 VITALS — BP 120/90 | HR 112 | Temp 97.9°F | Ht 60.0 in | Wt 133.5 lb

## 2018-10-24 DIAGNOSIS — G4451 Hemicrania continua: Secondary | ICD-10-CM | POA: Diagnosis not present

## 2018-10-24 DIAGNOSIS — R7989 Other specified abnormal findings of blood chemistry: Secondary | ICD-10-CM

## 2018-10-24 DIAGNOSIS — E039 Hypothyroidism, unspecified: Secondary | ICD-10-CM

## 2018-10-24 DIAGNOSIS — G43719 Chronic migraine without aura, intractable, without status migrainosus: Secondary | ICD-10-CM | POA: Diagnosis not present

## 2018-10-24 DIAGNOSIS — G43019 Migraine without aura, intractable, without status migrainosus: Secondary | ICD-10-CM | POA: Diagnosis not present

## 2018-10-24 DIAGNOSIS — I10 Essential (primary) hypertension: Secondary | ICD-10-CM

## 2018-10-24 LAB — CBC WITH DIFFERENTIAL/PLATELET
Basophils Absolute: 0 10*3/uL (ref 0.0–0.1)
Basophils Relative: 0.8 % (ref 0.0–3.0)
Eosinophils Absolute: 0.1 10*3/uL (ref 0.0–0.7)
Eosinophils Relative: 1.8 % (ref 0.0–5.0)
HCT: 43 % (ref 36.0–46.0)
Hemoglobin: 14.3 g/dL (ref 12.0–15.0)
Lymphocytes Relative: 40.8 % (ref 12.0–46.0)
Lymphs Abs: 2.4 10*3/uL (ref 0.7–4.0)
MCHC: 33.3 g/dL (ref 30.0–36.0)
MCV: 91.4 fl (ref 78.0–100.0)
MONOS PCT: 5.9 % (ref 3.0–12.0)
Monocytes Absolute: 0.4 10*3/uL (ref 0.1–1.0)
NEUTROS PCT: 50.7 % (ref 43.0–77.0)
Neutro Abs: 3 10*3/uL (ref 1.4–7.7)
PLATELETS: 356 10*3/uL (ref 150.0–400.0)
RBC: 4.71 Mil/uL (ref 3.87–5.11)
RDW: 13.1 % (ref 11.5–15.5)
WBC: 6 10*3/uL (ref 4.0–10.5)

## 2018-10-24 LAB — BASIC METABOLIC PANEL
BUN: 14 mg/dL (ref 6–23)
CO2: 21 mEq/L (ref 19–32)
Calcium: 10.7 mg/dL — ABNORMAL HIGH (ref 8.4–10.5)
Chloride: 104 mEq/L (ref 96–112)
Creatinine, Ser: 1 mg/dL (ref 0.40–1.20)
GFR: 70.07 mL/min (ref >60.00)
Glucose, Bld: 103 mg/dL — ABNORMAL HIGH (ref 70–99)
Potassium: 4.5 mEq/L (ref 3.5–5.1)
Sodium: 143 mEq/L (ref 135–145)

## 2018-10-24 LAB — FOLATE: FOLATE: 18.7 ng/mL (ref >5.9)

## 2018-10-24 LAB — IBC PANEL
Iron: 74 ug/dL (ref 42–145)
SATURATION RATIOS: 17.6 % — AB (ref 20.0–50.0)
Transferrin: 300 mg/dL (ref 212.0–360.0)

## 2018-10-24 LAB — TSH: TSH: 0.24 u[IU]/mL — ABNORMAL LOW (ref 0.35–4.50)

## 2018-10-24 LAB — FERRITIN: Ferritin: 37.6 ng/mL (ref 10.0–291.0)

## 2018-10-24 LAB — VITAMIN B12: Vitamin B-12: 676 pg/mL (ref 211–911)

## 2018-10-24 MED ORDER — UBROGEPANT 50 MG PO TABS: 1.0000 | ORAL_TABLET | Freq: Every day | ORAL | 5 refills | Status: DC | PRN

## 2018-10-24 MED FILL — UBRELVY 50 MG TABS: 50 | 10 days supply | Qty: 10 | Fill #0

## 2018-10-24 NOTE — Progress Notes (Signed)
Subjective:  Patient ID: Chelsey Hunt, female    DOB: 08-20-1966  Age: 53 y.o. MRN: [id number]  CC: Hypertension and Headache   HPI Chelsey Hunt presents for f/up - She is having more frequent and more severe headaches.  She wants to do a scan of her brain.  She describes the pain as a stabbing sensation in the right parietal and right frontal region.  He has mild nausea and photophobia but no vomiting or paresthesias.  She has tried to control the headaches with Imitrex but she tells me it is causing palpitations.  Outpatient Medications Prior to Visit  Medication Sig Dispense Refill  . acetaminophen (TYLENOL) 325 MG tablet Take 650 mg by mouth every 6 (six) hours as needed for mild pain.    . cyclobenzaprine (FLEXERIL) 10 MG tablet Take 1 tablet (10 mg total) by mouth 3 (three) times daily as needed for muscle spasms. 30 tablet 0  . Galcanezumab-gnlm (EMGALITY) 120 MG/ML SOSY Inject 120 mg into the skin every 30 (thirty) days. 1 Syringe 11  . ibuprofen (ADVIL,MOTRIN) 200 MG tablet Take 200 mg by mouth every 8 (eight) hours as needed.    . nortriptyline (PAMELOR) 10 MG capsule Take 1 capsule (10 mg total) by mouth at bedtime. 30 capsule 3  . ondansetron (ZOFRAN-ODT) 4 MG disintegrating tablet Take 1 tablet (4 mg total) by mouth every 8 (eight) hours as needed for nausea or vomiting. 20 tablet 0  . pantoprazole (PROTONIX) 40 MG tablet Take 40 mg by mouth 2 (two) times daily.    Marland Kitchen levothyroxine (SYNTHROID, LEVOTHROID) 100 MCG tablet Take 1 tablet (100 mcg total) by mouth daily. 90 tablet 1  . SUMAtriptan (TOSYMRA) 10 MG/ACT SOLN Place 10 mg into the nose as directed. 1 spray in nostril, may repeat in 1 hr PRN 9 each 3  . SUMAtriptan Succinate (ZEMBRACE SYMTOUCH) 3 MG/0.5ML SOAJ Inject 3 mg into the skin as directed. May repeat in 1 hour if needed 5 pen 3  . SUMAtriptan (TOSYMRA) 10 MG/ACT SOLN Place 10 mg into the nose as directed. 2 each 0  . SUMAtriptan Succinate (ZEMBRACE  SYMTOUCH) 3 MG/0.5ML SOAJ Inject 3 mg into the skin as directed. 2 pen 0   No facility-administered medications prior to visit.     ROS Review of Systems  Constitutional: Negative.  Negative for chills, diaphoresis, fatigue and fever.  HENT: Negative.  Negative for sore throat and trouble swallowing.   Eyes: Negative for visual disturbance.  Respiratory: Negative for cough, chest tightness, shortness of breath and wheezing.   Cardiovascular: Positive for palpitations. Negative for chest pain and leg swelling.  Gastrointestinal: Positive for nausea. Negative for abdominal pain, constipation, diarrhea and vomiting.  Genitourinary: Negative.  Negative for difficulty urinating.  Musculoskeletal: Negative for arthralgias, back pain and neck pain.  Skin: Negative.  Negative for color change and rash.  Neurological: Positive for headaches. Negative for dizziness, syncope, speech difficulty, weakness and light-headedness.  Hematological: Negative for adenopathy. Does not bruise/bleed easily.  Psychiatric/Behavioral: Negative.     Objective:  BP 120/90 (BP Location: Left Arm, Patient Position: Sitting, Cuff Size: Normal)   Pulse (!) 112   Temp 97.9 F (36.6 C) (Oral)   Ht 5' (1.524 m)   Wt 133 lb 8 oz (60.6 kg)   LMP 04/20/2013   SpO2 99%   BMI 26.07 kg/m   BP Readings from Last 3 Encounters:  10/24/18 120/90  10/22/18 133/83  10/07/18 124/86    Wt Readings from  Last 3 Encounters:  10/24/18 133 lb 8 oz (60.6 kg)  10/07/18 133 lb (60.3 kg)  09/30/18 133 lb (60.3 kg)    Physical Exam Vitals signs reviewed.  Constitutional:      General: She is in acute distress.     Appearance: She is not ill-appearing, toxic-appearing or diaphoretic.  Eyes:     General: No visual field deficit or scleral icterus.    Extraocular Movements: Extraocular movements intact.     Right eye: Normal extraocular motion and no nystagmus.     Left eye: Normal extraocular motion and no nystagmus.      Pupils: Pupils are equal, round, and reactive to light. Pupils are equal.     Right eye: Pupil is round and reactive.     Left eye: Pupil is round and reactive.  Neck:     Musculoskeletal: Normal range of motion and neck supple. No neck rigidity.     Meningeal: Brudzinski's sign absent.  Cardiovascular:     Rate and Rhythm: Normal rate and regular rhythm.     Heart sounds: Normal heart sounds. No murmur.  Pulmonary:     Effort: Pulmonary effort is normal.     Breath sounds: No stridor. No wheezing, rhonchi or rales.  Abdominal:     General: There is no distension.     Palpations: Abdomen is soft. There is no mass.     Tenderness: There is no abdominal tenderness.  Musculoskeletal: Normal range of motion.        General: No swelling or tenderness.  Lymphadenopathy:     Cervical: No cervical adenopathy.  Skin:    General: Skin is warm and dry.     Coloration: Skin is not pale.  Neurological:     Mental Status: She is alert and oriented to person, place, and time. Mental status is at baseline.     Cranial Nerves: No cranial nerve deficit, dysarthria or facial asymmetry.     Sensory: No sensory deficit.     Motor: No weakness.     Coordination: Romberg sign negative. Coordination normal.     Gait: Gait normal.     Deep Tendon Reflexes: Reflexes normal. Babinski sign absent on the right side. Babinski sign absent on the left side.  Psychiatric:        Mood and Affect: Mood normal.        Speech: Speech normal.        Behavior: Behavior normal.     Lab Results  Component Value Date   WBC 6.0 10/24/2018   HGB 14.3 10/24/2018   HCT 43.0 10/24/2018   PLT 356.0 10/24/2018   GLUCOSE 103 (H) 10/24/2018   CHOL 259 (H) 02/26/2018   TRIG 153.0 (H) 02/26/2018   HDL 78.80 02/26/2018   LDLDIRECT 133.5 10/23/2013   LDLCALC 150 (H) 02/26/2018   ALT 25 06/26/2018   AST 28 06/26/2018   NA 143 10/24/2018   K 4.5 10/24/2018   CL 104 10/24/2018   CREATININE 1.00 10/24/2018   BUN 14  10/24/2018   CO2 21 10/24/2018   TSH 0.24 (L) 10/24/2018   HGBA1C 5.9 07/09/2018    No results found.  Assessment & Plan:   Yana was seen today for hypertension and headache.  Diagnoses and all orders for this visit:  Intractable migraine without aura and without status migrainosus- She is having side effects with the triptan so I have asked her to try 1 of the newer CGRP antagonist agents for abortive therapy.  I will also scan her brain with an MRI to see if they are any structural lesions. -     Ubrogepant (UBRELVY) 50 MG TABS; Take 1 tablet by mouth daily as needed. -     MR Brain Wo Contrast; Future  Intractable chronic migraine without aura and without status migrainosus -     Ubrogepant (UBRELVY) 50 MG TABS; Take 1 tablet by mouth daily as needed. -     MR Brain Wo Contrast; Future  Hemicrania continua -     MR Brain Wo Contrast; Future  Elevated platelet count- Her platelet count is normal now as well as her vitamin levels were all normal. -     CBC with Differential/Platelet; Future -     Vitamin B12; Future -     IBC panel; Future -     Ferritin; Future -     Folate; Future  Acquired hypothyroidism- Her TSH is suppressed so I have asked her to lower her levothyroxine dose. -     TSH; Future -     levothyroxine (SYNTHROID, LEVOTHROID) 88 MCG tablet; Take 1 tablet (88 mcg total) by mouth daily.  Essential hypertension- Her blood pressure is adequately well controlled. -     Basic metabolic panel; Future  Hypercalcemia- She is not taking any medications that would cause this.  She is currently asymptomatic with this.  I have asked to return in about 2 months for me to recheck her calcium level.   I have discontinued Vanna Scotland levothyroxine, SUMAtriptan Succinate, SUMAtriptan, SUMAtriptan, and SUMAtriptan Succinate. I am also having her start on Ubrogepant and levothyroxine. Additionally, I am having her maintain her pantoprazole, acetaminophen,  cyclobenzaprine, Galcanezumab-gnlm, ibuprofen, ondansetron, and nortriptyline.  Meds ordered this encounter  Medications  . Ubrogepant (UBRELVY) 50 MG TABS    Sig: Take 1 tablet by mouth daily as needed.    Dispense:  10 tablet    Refill:  5  . levothyroxine (SYNTHROID, LEVOTHROID) 88 MCG tablet    Sig: Take 1 tablet (88 mcg total) by mouth daily.    Dispense:  90 tablet    Refill:  0     Follow-up: Return in about 2 months (around 12/23/2018).  [name], MD

## 2018-10-24 NOTE — Patient Instructions (Signed)

## 2018-10-26 ENCOUNTER — Encounter: Payer: Self-pay | Admitting: Internal Medicine

## 2018-10-26 MED ORDER — LEVOTHYROXINE SODIUM 88 MCG PO TABS: 88.0000 ug | ORAL_TABLET | Freq: Every day | ORAL | 0 refills | Status: DC

## 2018-10-27 MED FILL — SYNTHROID 88 MCG TABLET: 88 | 90 days supply | Qty: 90 | Fill #0

## 2018-11-03 ENCOUNTER — Telehealth: Payer: Self-pay | Admitting: Internal Medicine

## 2018-11-03 NOTE — Telephone Encounter (Signed)
Copied from [place] [id number]. Topic: Quick Communication - See Telephone Encounter >> Nov 03, 2018  2:27 PM Bea Graff, NT wrote: CRM for notification. See Telephone encounter for: 11/03/18. Lynette with [employer] calling for additional information for the PA for Ubrogepant (UBRELVY) 50 MG TABS. She is needing a list of any tripton meds the pt has tried and the plan only allows 16 tablets for 30 days and she would like to verify this is ok for the pt. They do not accept call backs and request info to be faxed. The PA expires today. Please advise. Fax#: [phone]

## 2018-11-04 NOTE — Telephone Encounter (Signed)
Faxed additional information yesterday.

## 2018-11-06 ENCOUNTER — Telehealth: Payer: Self-pay | Admitting: Neurology

## 2018-11-06 NOTE — Telephone Encounter (Signed)
Patient wanting to know about the Emgality medication was waiting on approval from pharmacy on file. Thanks

## 2018-11-07 ENCOUNTER — Other Ambulatory Visit: Payer: Self-pay

## 2018-11-07 MED ORDER — GALCANEZUMAB-GNLM 120 MG/ML ~~LOC~~ SOAJ: 120.0000 mg | SUBCUTANEOUS | 11 refills | Status: DC

## 2018-11-07 NOTE — Telephone Encounter (Signed)
Called [facility], spoke with Gerald Stabs. The Emgality syringe was approved, not the pen. Am correcting that now.

## 2018-11-07 NOTE — Telephone Encounter (Signed)
PA was approved  Pt informed of same.  

## 2018-11-11 ENCOUNTER — Telehealth: Payer: Self-pay | Admitting: Neurology

## 2018-11-11 NOTE — Telephone Encounter (Signed)
Emgality shot to the [facility] outpatient for the auto injector. Please call the patient at [phone] about this approval. Thanks!

## 2018-11-12 NOTE — Telephone Encounter (Signed)
Rcvd approval for Emgality auto-injectors  12 fills from 11/10/2018 to 11/09/2019

## 2018-11-12 NOTE — Telephone Encounter (Signed)
Called and spoke with May at [facility], determination will be within the next 24 hrs. Called Pt and advised her she can come by in the morning and get a sample.

## 2018-11-13 MED FILL — EMGALITY 120 MG/ML SOAJ: 120 | 30 days supply | Qty: 1 | Fill #0

## 2018-11-21 ENCOUNTER — Other Ambulatory Visit: Payer: Self-pay

## 2018-11-21 ENCOUNTER — Ambulatory Visit
Admission: RE | Admit: 2018-11-21 | Discharge: 2018-11-21 | Disposition: A | Discharge status: Home / Self Care | Payer: 59 | Source: Ambulatory Visit | Attending: Internal Medicine | Admitting: Internal Medicine

## 2018-11-21 DIAGNOSIS — R410 Disorientation, unspecified: Secondary | ICD-10-CM | POA: Diagnosis not present

## 2018-11-21 DIAGNOSIS — G43719 Chronic migraine without aura, intractable, without status migrainosus: Secondary | ICD-10-CM

## 2018-11-21 DIAGNOSIS — G43019 Migraine without aura, intractable, without status migrainosus: Secondary | ICD-10-CM

## 2018-11-21 DIAGNOSIS — R251 Tremor, unspecified: Secondary | ICD-10-CM | POA: Diagnosis not present

## 2018-11-21 DIAGNOSIS — G4451 Hemicrania continua: Secondary | ICD-10-CM

## 2018-11-22 ENCOUNTER — Encounter: Payer: Self-pay | Admitting: Internal Medicine

## 2018-11-23 NOTE — Progress Notes (Signed)
NEUROLOGY FOLLOW UP OFFICE NOTE  Chelsey Hunt [id number]  HISTORY OF PRESENT ILLNESS: Chelsey Hunt is a 53 year old right-handed woman with hypothyroidism who follows up for migraines.  She is accompanied by her husband who supplements history.    UPDATE: For abortive therapy, she tried Tosymra NS .  She tried [place] which was ineffective.  She also tried [name], which is helpful. She had an MRI of the brain on 11/21/2018 which was personally reviewed and demonstrated nonspecific white matter hyperintensities in the cerebral white matter, likely chronic small vessel ischemic changes, but no acute abnormality.  Intensity:  Moderate to severe Duration:  2 days Frequency:  18-20 headache days a month Current NSAIDS: Advil Current analgesics: Tylenol Current triptans: Zembrace Current ergotamine: None Current abortive anti-CGRP:  Ubrelvy Current anti-emetic: Promethazine Current muscle relaxants: Cyclobenzaprine 10 mg Current anti-anxiolytic: None Current sleep aide: None Current Antihypertensive medications: None Current Antidepressant medications: None Current Anticonvulsant medications: None Current anti-CGRP: Emgality Current Vitamins/Herbal/Supplements: None Current Antihistamines/Decongestants: None Other therapy: None Hormone/birth control: None  Caffeine: No Diet: 5-6 bottles of water daily Exercise: Not routine Depression: No; Anxiety: Yes Other pain:  no Sleep hygiene: Poor  HISTORY: Onset: First migraine was between 40 and 25 years old. Migraine free until 2004 during her menstrual cycle. Chronic for many years. Location:Left more than right. Quality:throbbing Initial intensity:Severe.  Aura:no Prodrome:no Postdrome:fatigue Associated symptoms: Right facial tingling, nausea, photophobia, phonophobia, osmophobia.Shedenies associated vomiting, visual disturbance orunilateral numbness or weakness. Initial duration:3 days  InitialFrequency:daily InitialFrequency of abortive medication:Advil or Tylenol daily, Maxalt 2 days a week Triggers: Certain smells, sausages, bananas Relieving factors:exercise Activity:aggravates During summer 2019, she has had throbbing occipital headache associated with left facial numbness, facial twitching. She started having a panic attack with palpitations. She went to the ED on 06/26/18 for symptoms. EKG showed sinus tachycardia. She was treated with headache cocktail  Past NSAIDS:Ibuprofen, naproxen Past analgesics:Excedrin, Lidocaine nasal Past abortive triptans:Relpax, sumatriptantablet, Maxalt, Tosymra NS, Zembrace.   Past abortive ergotamine:Migranal Past muscle relaxants:none Past anti-emetic:Zofran Past antihypertensive medications:Verapamil, propranolol Past antidepressant medications:Imipramine, nortriptyline Past anticonvulsant medications:topiramate Past anti-CGRP:Aimovig (aggravated migraine) Past vitamins/Herbal/Supplements:Riboflavin, magnesium Past antihistamines/decongestants:None Other past therapies:acupuncture  Family history of headache:Mom (migraines), daughter (migraines)  MRI and MRA of the head from 03/09/2009 was personally reviewed and revealed nonspecific small subcortical white matter hyperintensities and 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 PAST MEDICAL HISTORY: Past Medical History:  Diagnosis Date  . ACID REFLUX DISEASE 01/03/2008  . Anemia   . ANEMIA, IRON DEFICIENCY 12/23/2008   history  . Chronic tension type headache 01/13/2009   migraines  . Complication of anesthesia    pt states she just needs a small amount or she will sleep too long  . Dysplasia of cervix, low grade (CIN 1)   . Fibroid   . GERD (gastroesophageal reflux disease)   . HEART MURMUR, SYSTOLIC 54/00/8676   will have echo 06/18/2013  . 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  Irritable bowel syndrome 12/23/2008  . Migraine   . Ovarian cyst   . Ovarian cyst   . THYROID NODULE, RIGHT 04/21/2009    MEDICATIONS: Current Outpatient Medications on File Prior to Visit  Medication Sig Dispense Refill  . acetaminophen (TYLENOL) 325 MG tablet Take 650 mg by mouth every 6 (six) hours as needed for mild pain.    . cyclobenzaprine (FLEXERIL) 10 MG tablet Take 1 tablet (10 mg total) by mouth 3 (three) times daily as needed for muscle  spasms. 30 tablet 0  . Galcanezumab-gnlm (EMGALITY) 120 MG/ML SOAJ Inject 120 mg into the skin every 30 (thirty) days. 1 pen 11  . Galcanezumab-gnlm (EMGALITY) 120 MG/ML SOSY Inject 120 mg into the skin every 30 (thirty) days. 1 Syringe 11  . ibuprofen (ADVIL,MOTRIN) 200 MG tablet Take 200 mg by mouth every 8 (eight) hours as needed.    Marland Kitchen levothyroxine (SYNTHROID, LEVOTHROID) 88 MCG tablet Take 1 tablet (88 mcg total) by mouth daily. 90 tablet 0  . nortriptyline (PAMELOR) 10 MG capsule Take 1 capsule (10 mg total) by mouth at bedtime. 30 capsule 3  . ondansetron (ZOFRAN-ODT) 4 MG disintegrating tablet Take 1 tablet (4 mg total) by mouth every 8 (eight) hours as needed for nausea or vomiting. 20 tablet 0  . pantoprazole (PROTONIX) 40 MG tablet Take 40 mg by mouth 2 (two) times daily.    Marland Kitchen Ubrogepant (UBRELVY) 50 MG TABS Take 1 tablet by mouth daily as needed. 10 tablet 5   No current facility-administered medications on file prior to visit.     ALLERGIES: Allergies  Allergen Reactions  . Imitrex [Sumatriptan] Palpitations  . Propoxyphene N-Acetaminophen   . Nabumetone Rash    REACTION: GI upset  . Topiramate Rash    REACTION: rash    FAMILY HISTORY: Family History  Problem Relation Age of Onset  . Goiter Mother   . Heart disease Mother   . Thyroid disease Mother   . Heart attack Mother   . Graves' disease Sister   . Asthma Sister   . Cancer Father   . Heart disease Father   . Heart attack Father   . CVA Brother   . Hypertension  Brother   . Hypertension Brother   . Alcoholism Brother   . Hepatitis Sister   . Stomach cancer Sister   . Goiter Other        Siblings 2  . Hypertension Paternal Grandfather   . Depression Neg Hx   . Alcohol abuse Neg Hx   . Drug abuse Neg Hx   . Early death Neg Hx   . Hearing loss Neg Hx   . Kidney disease Neg Hx   . Stroke Neg Hx    SOCIAL HISTORY: Social History   Socioeconomic History  . Marital status: Married    Spouse name: Shanon Brow  . Number of children: 3  . Years of education: Not on file  . Highest education level: Associate degree: occupational, Hotel manager, or vocational program  Occupational History  . Occupation: WOMENS    Employer: [employer]  . Financial resource strain: Not on file  . Food insecurity:    Worry: Not on file    Inability: Not on file  . Transportation needs:    Medical: Not on file    Non-medical: Not on file  Tobacco Use  . Smoking status: Never Smoker  . Smokeless tobacco: Never Used  Substance and Sexual Activity  . Alcohol use: No    Comment: occ  . Drug use: No  . Sexual activity: Yes    Birth control/protection: Surgical    Comment: BTL  Lifestyle  . Physical activity:    Days per week: Not on file    Minutes per session: Not on file  . Stress: Not on file  Relationships  . Social connections:    Talks on phone: Not on file    Gets together: Not on file    Attends religious service: Not on file  Active member of club or organization: Not on file    Attends meetings of clubs or organizations: Not on file    Relationship status: Not on file  . Intimate partner violence:    Fear of current or ex partner: Not on file    Emotionally abused: Not on file    Physically abused: Not on file    Forced sexual activity: Not on file  Other Topics Concern  . Not on file  Social History Narrative   Regular exercise-yes      Patient is right-handed.She lives with her husband in a 1 story home. She has recently  been avoiding caffeine. She has been unable to exercise lately due to a back injury.    REVIEW OF SYSTEMS: Constitutional: No fevers, chills, or sweats, no generalized fatigue, change in appetite Eyes: No visual changes, double vision, eye pain Ear, nose and throat: No hearing loss, ear pain, nasal congestion, sore throat Cardiovascular: No chest pain, palpitations Respiratory:  No shortness of breath at rest or with exertion, wheezes GastrointestinaI: No nausea, vomiting, diarrhea, abdominal pain, fecal incontinence Genitourinary:  No dysuria, urinary retention or frequency Musculoskeletal:  No neck pain, back pain Integumentary: No rash, pruritus, skin lesions Neurological: as above Psychiatric: No depression, insomnia, anxiety Endocrine: No palpitations, fatigue, diaphoresis, mood swings, change in appetite, change in weight, increased thirst Hematologic/Lymphatic:  No purpura, petechiae. Allergic/Immunologic: no itchy/runny eyes, nasal congestion, recent allergic reactions, rashes  PHYSICAL EXAM: Blood pressure 102/64, pulse 86, temperature 97.9 F (36.6 C), height 5' (1.524 m), weight 134 lb (60.8 kg), last menstrual period 04/20/2013, SpO2 96 %. General: No acute distress.  Patient appears well-groomed.   Head:  Normocephalic/atraumatic Eyes:  Fundi examined but not visualized Neck: supple, no paraspinal tenderness, full range of motion Heart:  Regular rate and rhythm Lungs:  Clear to auscultation bilaterally Back: No paraspinal tenderness Neurological Exam: alert and oriented to person, place, and time. Attention span and concentration intact, recent and remote memory intact, fund of knowledge intact.  Speech fluent and not dysarthric, language intact.  CN II-XII intact. Bulk and tone normal, muscle strength 5/5 throughout.  Sensation to light touch, temperature and vibration intact.  Deep tendon reflexes 2+ throughout, toes downgoing.  Finger to nose and heel to shin testing  intact.  Gait normal, Romberg negative.  IMPRESSION: Chronic migraine without aura, without status migrainosus, not intractable.  Cerebrovascular disease as per MRI   PLAN: 1.  For preventative management, will stop Emgality and try Ajovy.  If ineffective, will proceed with Botox 2.  For abortive therapy, will try Reyvow.  If ineffective, she may resume Ubrelvy 3.  Limit use of pain relievers to no more than 2 days out of week to prevent risk of rebound or medication-overuse headache. 4.  Keep headache diary 5.  Exercise, hydration, caffeine cessation, sleep hygiene, monitor for and avoid triggers 6.  Consider:  magnesium citrate 400mg  daily, riboflavin 400mg  daily, and coenzyme Q10 100mg  three times daily 7.  Recommend starting ASA 81mg  daily, blood pressure and cholesterol control. 8. Follow up in May as already scheduled   Metta Clines, DO  CC: [name], MD

## 2018-11-24 ENCOUNTER — Encounter: Payer: Self-pay | Admitting: Neurology

## 2018-11-24 ENCOUNTER — Other Ambulatory Visit: Payer: Self-pay

## 2018-11-24 ENCOUNTER — Ambulatory Visit: Payer: 59 | Admitting: Neurology

## 2018-11-24 VITALS — BP 102/64 | HR 86 | Temp 97.9°F | Ht 60.0 in | Wt 134.0 lb

## 2018-11-24 DIAGNOSIS — G43719 Chronic migraine without aura, intractable, without status migrainosus: Secondary | ICD-10-CM | POA: Diagnosis not present

## 2018-11-24 MED ORDER — LASMIDITAN SUCCINATE 100 MG PO TABS: 100.0000 mg | ORAL_TABLET | ORAL | 0 refills | Status: DC

## 2018-11-24 MED ORDER — LASMIDITAN SUCCINATE 100 MG PO TABS: 1.0000 | ORAL_TABLET | Freq: Every day | ORAL | 3 refills | Status: DC | PRN

## 2018-11-24 MED ORDER — FREMANEZUMAB-VFRM 225 MG/1.5ML ~~LOC~~ SOSY: 225.0000 mg | PREFILLED_SYRINGE | SUBCUTANEOUS | 11 refills | Status: DC

## 2018-11-24 MED ORDER — FREMANEZUMAB-VFRM 225 MG/1.5ML ~~LOC~~ SOSY: 225.0000 mg | PREFILLED_SYRINGE | Freq: Once | SUBCUTANEOUS | 0 refills | Status: AC

## 2018-11-24 NOTE — Patient Instructions (Signed)
1.  For preventative management, we will start Ajovy.  Stop Emgality 2.  Stop Ubrelvy.  For abortive therapy, try Reyvow.  Take 1 tablet.  Maximum 1 tablet in 24 hours. 3.  Limit use of pain relievers to no more than 2 days out of week to prevent risk of rebound or medication-overuse headache. 4.  Keep headache diary 5.  Exercise, hydration, caffeine cessation, sleep hygiene, monitor for and avoid triggers 6.  Consider:  magnesium citrate 400mg  daily, riboflavin 400mg  daily, and coenzyme Q10 100mg  three times daily 7.  Follow up in May as scheduled.

## 2018-12-03 ENCOUNTER — Encounter: Payer: Self-pay | Admitting: *Deleted

## 2018-12-03 NOTE — Progress Notes (Addendum)
[name] (Key: [id number])  Rx #: [id number]  Reyvow 100MG  tablets  Form [employer] ePA Form  Created  14 days ago  Sent to Plan  5 days ago  Plan Response  5 days ago  Submit Clinical Questions  5 days ago  Determination  Unfavorable  3 days ago  Prior authorization for Reyvow has been denied. You may complete an appeal and request a re-evaluation of the coverage determination for this patient. Information about how to complete an appeal will be sent to you shortly. If you would like to start an appeal, please call CoverMyMeds at [phone]. Message from plan: The request has been partially approved. The authorization is effective for a maximum of 6 fills from 11/24/2018 to 05/26/2019, as long as the member is enrolled in their current health plan. We were asked to cover a larger amount of the drug listed at the top of this letter than allowed under our prior authorization rule. Your doctor asked [place] to cover 16 tablets for a 30-day supply. This request has been approved for 8 tablets per 30 days. For the acute (sudden) treatment of migraine, our guideline named LASMIDITAN (generic Reyvow), allows for approval of a maximum of 8 tablets per 30-day supply for Reyvow 100mg  tablets. This is why you were approved for a lower quantity than requested by your doctor. Please talk to your doctor about use of this medication within the authorized quantity limit or other alternative medications further. A written notification letter will follow with additional details.      Covermymeds sent to plan 12/03/2018 TAL NEER (Key: [id number])  Rx #: [id number]  Reyvow 100MG  tablets   [employer] ePA Form  If [employer] has not replied within 24 hours for urgent requests or within 48 hours for standard requests, please contact [employer] at [phone]

## 2019-01-08 ENCOUNTER — Ambulatory Visit (INDEPENDENT_AMBULATORY_CARE_PROVIDER_SITE_OTHER): Payer: 59 | Admitting: Internal Medicine

## 2019-01-08 ENCOUNTER — Encounter: Payer: Self-pay | Admitting: Internal Medicine

## 2019-01-08 ENCOUNTER — Other Ambulatory Visit (INDEPENDENT_AMBULATORY_CARE_PROVIDER_SITE_OTHER): Payer: 59

## 2019-01-08 DIAGNOSIS — M79675 Pain in left toe(s): Secondary | ICD-10-CM

## 2019-01-08 DIAGNOSIS — E039 Hypothyroidism, unspecified: Secondary | ICD-10-CM

## 2019-01-08 DIAGNOSIS — R7989 Other specified abnormal findings of blood chemistry: Secondary | ICD-10-CM

## 2019-01-08 DIAGNOSIS — K5904 Chronic idiopathic constipation: Secondary | ICD-10-CM | POA: Diagnosis not present

## 2019-01-08 DIAGNOSIS — B351 Tinea unguium: Secondary | ICD-10-CM | POA: Diagnosis not present

## 2019-01-08 LAB — BASIC METABOLIC PANEL
BUN: 12 mg/dL (ref 6–23)
CO2: 26 mEq/L (ref 19–32)
Calcium: 9.6 mg/dL (ref 8.4–10.5)
Chloride: 102 mEq/L (ref 96–112)
Creatinine, Ser: 0.89 mg/dL (ref 0.40–1.20)
GFR: 80.09 mL/min (ref >60.00)
Glucose, Bld: 94 mg/dL (ref 70–99)
Potassium: 4.1 mEq/L (ref 3.5–5.1)
Sodium: 136 mEq/L (ref 135–145)

## 2019-01-08 LAB — COMPREHENSIVE METABOLIC PANEL
ALT: 29 U/L (ref 0–35)
AST: 22 U/L (ref 0–37)
Albumin: 4.1 g/dL (ref 3.5–5.2)
Alkaline Phosphatase: 91 U/L (ref 39–117)
BUN: 12 mg/dL (ref 6–23)
CO2: 26 mEq/L (ref 19–32)
Calcium: 9.6 mg/dL (ref 8.4–10.5)
Chloride: 102 mEq/L (ref 96–112)
Creatinine, Ser: 0.89 mg/dL (ref 0.40–1.20)
GFR: 80.09 mL/min (ref >60.00)
Glucose, Bld: 94 mg/dL (ref 70–99)
Potassium: 4.1 mEq/L (ref 3.5–5.1)
Sodium: 136 mEq/L (ref 135–145)
Total Bilirubin: 0.2 mg/dL (ref 0.2–1.2)
Total Protein: 7.4 g/dL (ref 6.0–8.3)

## 2019-01-08 LAB — IBC PANEL
Iron: 54 ug/dL (ref 42–145)
Saturation Ratios: 15.1 % — ABNORMAL LOW (ref 20.0–50.0)
Transferrin: 256 mg/dL (ref 212.0–360.0)

## 2019-01-08 LAB — TSH: TSH: 0.02 u[IU]/mL — ABNORMAL LOW (ref 0.35–4.50)

## 2019-01-08 MED ORDER — LEVOTHYROXINE SODIUM 75 MCG PO TABS: 75.0000 ug | ORAL_TABLET | Freq: Every day | ORAL | 0 refills | Status: DC

## 2019-01-08 MED ORDER — PLECANATIDE 3 MG PO TABS: 1.0000 | ORAL_TABLET | Freq: Every day | ORAL | 1 refills | Status: DC

## 2019-01-08 NOTE — Progress Notes (Signed)
Virtual Visit via Video Note  I connected with Chelsey Hunt on 01/08/2019 at  2:00 PM EDT by a video enabled telemedicine application and verified that I am speaking with the correct person using two identifiers.   I discussed the limitations of evaluation and management by telemedicine and the availability of in person appointments. The patient expressed understanding and agreed to proceed.  History of Present Illness: She checked in for a virtual visit.  She was not willing to come in for an office visit due to the COVID-19 pandemic.  She complains of recurrent headaches up to 2-3 times per week.  She is, however, not using 1 of the CGRP antagonists that has been prescribed or taking any other medications for the headaches.  She also complains of fatigue, weight gain, and constipation.  She has tried to control the constipation with MiraLAX but has not gotten much symptom relief.  She says her sleep pattern is normal during the night but she craves naps during the day.  She denies chest pain, shortness of breath, edema, anxiety, depression, or panic attacks.    Observations/Objective: Obese female.  Calm, cooperative, and appropriate.  No acute distress noted.   Lab Results  Component Value Date   WBC 6.0 10/24/2018   HGB 14.3 10/24/2018   HCT 43.0 10/24/2018   PLT 356.0 10/24/2018   GLUCOSE 94 01/08/2019   GLUCOSE 94 01/08/2019   CHOL 259 (H) 02/26/2018   TRIG 153.0 (H) 02/26/2018   HDL 78.80 02/26/2018   LDLDIRECT 133.5 10/23/2013   LDLCALC 150 (H) 02/26/2018   ALT 29 01/08/2019   AST 22 01/08/2019   NA 136 01/08/2019   NA 136 01/08/2019   K 4.1 01/08/2019   K 4.1 01/08/2019   CL 102 01/08/2019   CL 102 01/08/2019   CREATININE 0.89 01/08/2019   CREATININE 0.89 01/08/2019   BUN 12 01/08/2019   BUN 12 01/08/2019   CO2 26 01/08/2019   CO2 26 01/08/2019   TSH 0.02 (L) 01/08/2019   HGBA1C 5.9 07/09/2018     Assessment and Plan: Her TSH is suppressed down to 0.02.  I  have therefore decreased her dose of levothyroxine.    She complains of migraine headaches though she is not willing to treat them so I am concerned that there may be some secondary gain here.  Will continue to follow this.    She is also not getting much symptom relief for the constipation with MiraLAX so I have asked her to try Trulance.   Follow Up Instructions: Her symptoms are not related to an underactive thyroid gland.  I have asked her to improve her lifestyle modifications with diet/exercise/weight loss.  For treatment of the constipation she will try the true [facility] and she will also increase her intake of fresh fruits and vegetables and water.  She will let me know if she develops any new or worsening symptoms.  She agrees to change the dose of her thyroid supplement.    I discussed the assessment and treatment plan with the patient. The patient was provided an opportunity to ask questions and all were answered. The patient agreed with the plan and demonstrated an understanding of the instructions.   The patient was advised to call back or seek an in-person evaluation if the symptoms worsen or if the condition fails to improve as anticipated.  I provided 25 minutes of non-face-to-face time during this encounter.   [name], MD

## 2019-01-09 ENCOUNTER — Telehealth: Payer: Self-pay | Admitting: Internal Medicine

## 2019-01-09 DIAGNOSIS — K5904 Chronic idiopathic constipation: Secondary | ICD-10-CM

## 2019-01-09 DIAGNOSIS — E039 Hypothyroidism, unspecified: Secondary | ICD-10-CM

## 2019-01-09 MED ORDER — LEVOTHYROXINE SODIUM 75 MCG PO TABS: 75.0000 ug | ORAL_TABLET | Freq: Every day | ORAL | 0 refills | Status: DC

## 2019-01-09 MED ORDER — PLECANATIDE 3 MG PO TABS: 1.0000 | ORAL_TABLET | Freq: Every day | ORAL | 1 refills | Status: DC

## 2019-01-09 MED FILL — SYNTHROID 75 MCG TABLET: 75 | 90 days supply | Qty: 90 | Fill #0

## 2019-01-09 NOTE — Telephone Encounter (Signed)
I spoke to pt- she is requesting brand name Synthroid and for both meds PCP rx'd on 01/08/19 to be sent to [facility]. Rxs sent. See meds. Pt informed.

## 2019-01-09 NOTE — Telephone Encounter (Signed)
Copied from [place] [id number]. Topic: Quick Communication - See Telephone Encounter >> Jan 09, 2019 10:06 AM Robina Ade, Helene Kelp D wrote: CRM for notification. See Telephone encounter for: 01/09/19. Patient called and would like to talk to Dr. Ronnald Ramp CMA about her medication levothyroxine (SYNTHROID) 75 MCG tablet. Please call patient back.

## 2019-01-12 ENCOUNTER — Encounter: Payer: Self-pay | Admitting: Internal Medicine

## 2019-01-15 LAB — PTH, INTACT AND CALCIUM
Calcium: 9.8 mg/dL (ref 8.6–10.4)
PTH: 31 pg/mL (ref 14–64)

## 2019-01-20 ENCOUNTER — Telehealth: Payer: Self-pay

## 2019-01-20 NOTE — Telephone Encounter (Signed)
Key: [id number]  PA has been approved. Left detailed message for pt informing of same.

## 2019-01-22 MED FILL — TRULANCE 3 MG TABLET: 3 | 90 days supply | Qty: 90 | Fill #0

## 2019-01-28 NOTE — Progress Notes (Signed)
Virtual Visit via Video Note The purpose of this virtual visit is to provide medical care while limiting exposure to the novel coronavirus.    Consent was obtained for video visit:  Yes Answered questions that patient had about telehealth interaction:  Yes I discussed the limitations, risks, security and privacy concerns of performing an evaluation and management service by telemedicine. I also discussed with the patient that there may be a patient responsible charge related to this service. The patient expressed understanding and agreed to proceed.  Pt location: Home Physician Location: Home Name of referring provider:  Janith Lima, MD I connected with Chelsey Hunt at patients initiation/request on 01/29/2019 at  2:10 PM EDT by video enabled telemedicine application and verified that I am speaking with the correct person using two identifiers. Pt MRN:  [id number] Pt DOB:  March 04, 1966 Video Participants:  Chelsey Hunt   History of Present Illness:  Chelsey Hunt is a 53 year old right-handed woman with hypothyroidism who follows up for migraines.  UPDATE: She took one dose of Ajovy but didn't like the fact it wasn't an autoinject Intensity: Moderate to severe Duration:2 days Frequency:18-20 headache days a month Current NSAIDS: Advil Current analgesics: Tylenol Current triptans:None Current ergotamine: None Current abortive anti-CGRP:  none Other current abortive therapy:  Reyvow Current anti-emetic: Promethazine Current muscle relaxants: Cyclobenzaprine 10 mg Current anti-anxiolytic: None Current sleep aide: None Current Antihypertensive medications: None Current Antidepressant medications: None Current Anticonvulsant medications: None Current anti-CGRP: Ajovy Current Vitamins/Herbal/Supplements: Co Q-10 Current Antihistamines/Decongestants: None Other therapy: None Hormone/birth control: None  Caffeine: No Diet: 5-6 bottles of water daily  Exercise: Not routine Depression: No; Anxiety: Yes Other pain:no Sleep hygiene: Poor  HISTORY: Onset: First migraine was between 1 and 77 years old. Migraine free until 2004 during her menstrual cycle. Chronic for many years. Location:Left more than right. Quality:throbbing Initial intensity:Severe.  Aura:no Prodrome:no Postdrome:fatigue Associated symptoms:  Right facial tingling, nausea, photophobia, phonophobia, osmophobia.Shedenies associated vomiting, visual disturbance orunilateral numbness or weakness. Initial duration:3 days InitialFrequency:daily InitialFrequency of abortive medication:Advil or Tylenol daily, Maxalt 2 days a week Triggers:  Certain smells, sausages, bananas Relieving factors:  exercise Activity:aggravates During summer 2019, she has had throbbing occipital headache associated with left facial numbness, facial twitching. She started having a panic attack with palpitations. She went to the ED on 06/26/18 for symptoms. EKG showed sinus tachycardia. She was treated with headache cocktail  Past NSAIDS:Ibuprofen, naproxen, Cambia Past analgesics:Excedrin, Lidocaine nasal Past abortive triptans:Relpax, sumatriptantablet, Maxalt, Tosymra NS, Zembrace.   Past abortive ergotamine:Migranal Past muscle relaxants:none Past anti-emetic:Zofran Past antihypertensive medications:Verapamil, propranolol Past antidepressant medications:Imipramine, nortriptyline Past anticonvulsant medications:topiramate Past anti-CGRP:Aimovig (aggravated migraine), Emgality (ineffective), Ubrelvy (ineffective) Past vitamins/Herbal/Supplements:Riboflavin, magnesium Past antihistamines/decongestants:None Other past therapies:acupuncture  Family history of headache:Mom (migraines), daughter (migraines)  MRI and MRA of the head from 03/09/2009 was personally reviewed and revealed nonspecific small subcortical white matter  hyperintensities and 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  To evaluate worsening headaches, she had an MRI of the brain on 11/21/2018 which demonstrated nonspecific white matter hyperintensities in the cerebral white matter, likely chronic small vessel ischemic changes, but no acute abnormality.  Past Medical History: Past Medical History:  Diagnosis Date  . ACID REFLUX DISEASE 01/03/2008  . Anemia   . ANEMIA, IRON DEFICIENCY 12/23/2008   history  . Chronic tension type headache 01/13/2009   migraines  . Complication of anesthesia    pt states she just needs a small amount or  she will sleep too long  . Dysplasia of cervix, low grade (CIN 1)   . Fibroid   . GERD (gastroesophageal reflux disease)   . HEART MURMUR, SYSTOLIC 87/56/4332   will have echo 06/18/2013  . 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 Irritable bowel syndrome 12/23/2008  . Migraine   . Ovarian cyst   . Ovarian cyst   . THYROID NODULE, RIGHT 04/21/2009    Medications: Outpatient Encounter Medications as of 01/29/2019  Medication Sig  . acetaminophen (TYLENOL) 325 MG tablet Take 650 mg by mouth every 6 (six) hours as needed for mild pain.  . Fremanezumab-vfrm (AJOVY [place]) Inject into the skin.  Marland Kitchen ibuprofen (ADVIL,MOTRIN) 200 MG tablet Take 200 mg by mouth every 8 (eight) hours as needed.  [place] Succinate (REYVOW) 100 MG TABS Take 1 tablet by mouth daily as needed.  Marland Kitchen levothyroxine (SYNTHROID) 75 MCG tablet Take 1 tablet (75 mcg total) by mouth daily.  . pantoprazole (PROTONIX) 40 MG tablet Take 40 mg by mouth 2 (two) times daily.  Marland Kitchen Plecanatide (TRULANCE) 3 MG TABS Take 1 tablet by mouth daily.   No facility-administered encounter medications on file as of 01/29/2019.     Allergies: Allergies  Allergen Reactions  . Imitrex [Sumatriptan] Palpitations  . Propoxyphene N-Acetaminophen   . Nabumetone Rash    REACTION: GI upset  . Topiramate Rash    REACTION:  rash    Family History: Family History  Problem Relation Age of Onset  . Goiter Mother   . Heart disease Mother   . Thyroid disease Mother   . Heart attack Mother   . Graves' disease Sister   . Asthma Sister   . Cancer Father   . Heart disease Father   . Heart attack Father   . CVA Brother   . Hypertension Brother   . Hypertension Brother   . Alcoholism Brother   . Hepatitis Sister   . Stomach cancer Sister   . Goiter Other        Siblings 2  . Hypertension Paternal Grandfather   . Depression Neg Hx   . Alcohol abuse Neg Hx   . Drug abuse Neg Hx   . Early death Neg Hx   . Hearing loss Neg Hx   . Kidney disease Neg Hx   . Stroke Neg Hx     Social History: Social History   Socioeconomic History  . Marital status: Married    Spouse name: Shanon Brow  . Number of children: 3  . Years of education: Not on file  . Highest education level: Associate degree: occupational, Hotel manager, or vocational program  Occupational History  . Occupation: WOMENS    Employer: [employer]  . Financial resource strain: Not on file  . Food insecurity:    Worry: Not on file    Inability: Not on file  . Transportation needs:    Medical: Not on file    Non-medical: Not on file  Tobacco Use  . Smoking status: Never Smoker  . Smokeless tobacco: Never Used  Substance and Sexual Activity  . Alcohol use: No    Comment: occ  . Drug use: No  . Sexual activity: Yes    Birth control/protection: Surgical    Comment: BTL  Lifestyle  . Physical activity:    Days per week: Not on file    Minutes per session: Not on file  . Stress: Not on file  Relationships  . Social connections:  Talks on phone: Not on file    Gets together: Not on file    Attends religious service: Not on file    Active member of club or organization: Not on file    Attends meetings of clubs or organizations: Not on file    Relationship status: Not on file  . Intimate partner violence:    Fear of current  or ex partner: Not on file    Emotionally abused: Not on file    Physically abused: Not on file    Forced sexual activity: Not on file  Other Topics Concern  . Not on file  Social History Narrative   Regular exercise-yes      Patient is right-handed.She lives with her husband in a 1 story home. She has recently been avoiding caffeine. She has been unable to exercise lately due to a back injury.    Observations/Objective:   Blood pressure 106/79, temperature 98.6 F (37 C), height 5' (1.524 m), weight 135 lb (61.2 kg), last menstrual period 04/20/2013. No acute distress.  Alert and oriented.  Speech fluent and not dysarthric.  Language intact.  Face symmetric.    Assessment and Plan:   Chronic migraine without aura, without status migrainosus, not intractablel  1.  For preventative management, we will retry Ajovy as it is now available as an autoinjector.  If ineffective, consider Botox or Cefaly device. 2.  For abortive therapy, she will stop Reyvow and try Nurtec 3.  Limit use of pain relievers to no more than 2 days out of week to prevent risk of rebound or medication-overuse headache. 4.  Keep headache diary 5.  Exercise, hydration, caffeine cessation, sleep hygiene, monitor for and avoid triggers 6.  Consider:  magnesium citrate 400mg  daily, riboflavin 400mg  daily, and coenzyme Q10 100mg  three times daily 7. Always keep in mind that currently taking a hormone or birth control may be a possible trigger or aggravating factor for migraine. 8. Follow up in 3 to 4 months.   Follow Up Instructions:    -I discussed the assessment and treatment plan with the patient. The patient was provided an opportunity to ask questions and all were answered. The patient agreed with the plan and demonstrated an understanding of the instructions.   The patient was advised to call back or seek an in-person evaluation if the symptoms worsen or if the condition fails to improve as anticipated.     Dudley Major, DO

## 2019-01-29 ENCOUNTER — Other Ambulatory Visit: Payer: Self-pay

## 2019-01-29 ENCOUNTER — Telehealth (INDEPENDENT_AMBULATORY_CARE_PROVIDER_SITE_OTHER): Payer: 59 | Admitting: Neurology

## 2019-01-29 ENCOUNTER — Encounter: Payer: Self-pay | Admitting: Neurology

## 2019-01-29 ENCOUNTER — Telehealth: Payer: Self-pay

## 2019-01-29 VITALS — BP 106/79 | Temp 98.6°F | Ht 60.0 in | Wt 135.0 lb

## 2019-01-29 DIAGNOSIS — G43709 Chronic migraine without aura, not intractable, without status migrainosus: Secondary | ICD-10-CM

## 2019-01-29 MED ORDER — FREMANEZUMAB-VFRM 225 MG/1.5ML ~~LOC~~ SOAJ: 225.0000 mg | SUBCUTANEOUS | 11 refills | Status: DC

## 2019-01-29 MED ORDER — RIMEGEPANT SULFATE 75 MG PO TBDP: 1.0000 | ORAL_TABLET | Freq: Every day | ORAL | 3 refills | Status: DC | PRN

## 2019-01-29 NOTE — Telephone Encounter (Signed)
Called Pt and advised I have co-pay cards for both Ajovy and Nurtec available for her to pick up at the front desk.

## 2019-02-05 ENCOUNTER — Ambulatory Visit: Payer: 59 | Admitting: Neurology

## 2019-02-12 ENCOUNTER — Telehealth: Payer: Self-pay

## 2019-02-12 NOTE — Telephone Encounter (Signed)
Prior authorization for Nurtec initiated via covermymeds.  Waiting determination.

## 2019-02-12 NOTE — Telephone Encounter (Signed)
Pt called c/o having pains in her shoulder, tingling and numbness in fingers mostly at night; She thinks it might be circulation issues; this has been going on for like 2 weeks

## 2019-02-12 NOTE — Telephone Encounter (Signed)
Her vascular exam was normal 6 months ago. Her symptoms suggest neuropathic pain probably from cervical spine

## 2019-02-13 NOTE — Telephone Encounter (Signed)
What should she do about this

## 2019-02-14 NOTE — Telephone Encounter (Signed)
D/W PCP about arthritis and consider further evaluation

## 2019-02-17 NOTE — Telephone Encounter (Addendum)
Rcvd fax from [place]. Nurtec has been partially approved for a maximum of 6 fills from  02/10/19 - 08/11/19  Called [facility], LMOVM advising of approval

## 2019-02-19 MED FILL — NURTEC 75 MG TBDP: 75 | 30 days supply | Qty: 8 | Fill #0

## 2019-02-19 NOTE — Progress Notes (Signed)
Prior auth for nurtec was approved.  Pharmacy aware.  Patient aware.

## 2019-03-04 ENCOUNTER — Other Ambulatory Visit (INDEPENDENT_AMBULATORY_CARE_PROVIDER_SITE_OTHER): Payer: 59

## 2019-03-04 ENCOUNTER — Other Ambulatory Visit: Payer: Self-pay

## 2019-03-04 ENCOUNTER — Ambulatory Visit (INDEPENDENT_AMBULATORY_CARE_PROVIDER_SITE_OTHER): Payer: 59 | Admitting: Internal Medicine

## 2019-03-04 ENCOUNTER — Encounter: Payer: Self-pay | Admitting: Internal Medicine

## 2019-03-04 VITALS — BP 120/80 | HR 98 | Temp 98.1°F | Ht 60.0 in | Wt 137.0 lb

## 2019-03-04 DIAGNOSIS — I10 Essential (primary) hypertension: Secondary | ICD-10-CM

## 2019-03-04 DIAGNOSIS — Z0000 Encounter for general adult medical examination without abnormal findings: Secondary | ICD-10-CM | POA: Diagnosis not present

## 2019-03-04 DIAGNOSIS — E039 Hypothyroidism, unspecified: Secondary | ICD-10-CM | POA: Diagnosis not present

## 2019-03-04 DIAGNOSIS — Z1231 Encounter for screening mammogram for malignant neoplasm of breast: Secondary | ICD-10-CM | POA: Insufficient documentation

## 2019-03-04 DIAGNOSIS — G43019 Migraine without aura, intractable, without status migrainosus: Secondary | ICD-10-CM

## 2019-03-04 LAB — BASIC METABOLIC PANEL
BUN: 12 mg/dL (ref 6–23)
CO2: 26 mEq/L (ref 19–32)
Calcium: 9.7 mg/dL (ref 8.4–10.5)
Chloride: 103 mEq/L (ref 96–112)
Creatinine, Ser: 0.88 mg/dL (ref 0.40–1.20)
GFR: 81.1 mL/min (ref >60.00)
Glucose, Bld: 89 mg/dL (ref 70–99)
Potassium: 3.7 mEq/L (ref 3.5–5.1)
Sodium: 139 mEq/L (ref 135–145)

## 2019-03-04 LAB — LIPID PANEL
Cholesterol: 215 mg/dL — ABNORMAL HIGH (ref 0–200)
HDL: 65.6 mg/dL (ref >39.00)
LDL Cholesterol: 135 mg/dL — ABNORMAL HIGH (ref 0–99)
NonHDL: 149.18
Total CHOL/HDL Ratio: 3
Triglycerides: 69 mg/dL (ref 0.0–149.0)
VLDL: 13.8 mg/dL (ref 0.0–40.0)

## 2019-03-04 LAB — TSH: TSH: 0.36 u[IU]/mL (ref 0.35–4.50)

## 2019-03-04 MED ORDER — UBRELVY 50 MG PO TABS: 1.0000 | ORAL_TABLET | Freq: Once | ORAL | 5 refills | Status: AC

## 2019-03-04 NOTE — Patient Instructions (Signed)

## 2019-03-04 NOTE — Progress Notes (Signed)
Subjective:  Patient ID: Chelsey Hunt, female    DOB: 12/16/1965  Age: 53 y.o. MRN: [id number]  CC: Hypothyroidism and Annual Exam   HPI Chelsey Hunt presents for a CPX.  She continues to have intermittent headaches and tells me she is getting the most symptom relief with UBRELVY.  She also complains of fatigue and weight gain.  Outpatient Medications Prior to Visit  Medication Sig Dispense Refill   acetaminophen (TYLENOL) 325 MG tablet Take 650 mg by mouth every 6 (six) hours as needed for mild pain.     Fremanezumab-vfrm (AJOVY) 225 MG/1.5ML SOAJ Inject 225 mg into the skin every 30 (thirty) days. 1 pen 11   ibuprofen (ADVIL,MOTRIN) 200 MG tablet Take 200 mg by mouth every 8 (eight) hours as needed.     levothyroxine (SYNTHROID) 75 MCG tablet Take 1 tablet (75 mcg total) by mouth daily. 90 tablet 0   pantoprazole (PROTONIX) 40 MG tablet Take 40 mg by mouth 2 (two) times daily.     Plecanatide (TRULANCE) 3 MG TABS Take 1 tablet by mouth daily. 90 tablet 1   co-enzyme Q-10 30 MG capsule Take 30 mg by mouth 3 (three) times daily.     Fremanezumab-vfrm (AJOVY [place]) Inject into the skin.     Lasmiditan Succinate (REYVOW) 100 MG TABS Take 1 tablet by mouth daily as needed. 16 tablet 3   Rimegepant Sulfate (NURTEC) 75 MG TBDP Take 1 tablet by mouth daily as needed. For migraine. 20 tablet 3   No facility-administered medications prior to visit.     ROS Review of Systems  Constitutional: Positive for fatigue and unexpected weight change. Negative for diaphoresis.  HENT: Negative.   Eyes: Negative for visual disturbance.  Respiratory: Negative for cough, chest tightness, shortness of breath and wheezing.   Cardiovascular: Negative for chest pain, palpitations and leg swelling.  Gastrointestinal: Negative for abdominal pain, constipation, diarrhea, nausea and vomiting.  Endocrine: Negative for cold intolerance and heat intolerance.  Genitourinary: Negative.  Negative  for difficulty urinating.  Musculoskeletal: Negative.  Negative for arthralgias and myalgias.  Skin: Negative.  Negative for color change.  Neurological: Positive for headaches. Negative for dizziness, weakness and light-headedness.  Hematological: Negative for adenopathy. Does not bruise/bleed easily.  Psychiatric/Behavioral: Negative.     Objective:  BP 120/80 (BP Location: Left Arm, Patient Position: Sitting, Cuff Size: Normal)    Pulse 98    Temp 98.1 F (36.7 C) (Oral)    Ht 5' (1.524 m)    Wt 137 lb (62.1 kg)    LMP 04/20/2013    SpO2 99%    BMI 26.76 kg/m   BP Readings from Last 3 Encounters:  03/04/19 120/80  01/29/19 106/79  11/24/18 102/64    Wt Readings from Last 3 Encounters:  03/04/19 137 lb (62.1 kg)  01/29/19 135 lb (61.2 kg)  11/24/18 134 lb (60.8 kg)    Physical Exam Constitutional:      Appearance: She is not ill-appearing or diaphoretic.  HENT:     Nose: Nose normal.     Mouth/Throat:     Mouth: Mucous membranes are moist.  Eyes:     General: No scleral icterus.    Conjunctiva/sclera: Conjunctivae normal.  Neck:     Musculoskeletal: Normal range of motion. No neck rigidity or muscular tenderness.  Cardiovascular:     Rate and Rhythm: Normal rate and regular rhythm.     Heart sounds: No murmur.  Pulmonary:     Effort: Pulmonary effort  is normal.     Breath sounds: No stridor. No wheezing, rhonchi or rales.  Abdominal:     General: Abdomen is protuberant. There is no distension.     Palpations: There is no hepatomegaly or splenomegaly.     Tenderness: There is no abdominal tenderness.  Musculoskeletal: Normal range of motion.     Right lower leg: No edema.     Left lower leg: No edema.  Lymphadenopathy:     Cervical: No cervical adenopathy.  Skin:    General: Skin is dry.     Coloration: Skin is not pale.  Neurological:     General: No focal deficit present.     Mental Status: She is alert and oriented to person, place, and time. Mental status  is at baseline.  Psychiatric:        Mood and Affect: Mood normal.        Behavior: Behavior normal.     Lab Results  Component Value Date   WBC 6.0 10/24/2018   HGB 14.3 10/24/2018   HCT 43.0 10/24/2018   PLT 356.0 10/24/2018   GLUCOSE 89 03/04/2019   CHOL 215 (H) 03/04/2019   TRIG 69.0 03/04/2019   HDL 65.60 03/04/2019   LDLDIRECT 133.5 10/23/2013   LDLCALC 135 (H) 03/04/2019   ALT 29 01/08/2019   AST 22 01/08/2019   NA 139 03/04/2019   K 3.7 03/04/2019   CL 103 03/04/2019   CREATININE 0.88 03/04/2019   BUN 12 03/04/2019   CO2 26 03/04/2019   TSH 0.36 03/04/2019   HGBA1C 5.9 07/09/2018    Mr Brain Wo Contrast  Result Date: 11/21/2018 CLINICAL DATA:  Headache.  Tremors.  Nausea and confusion. EXAM: MRI HEAD WITHOUT CONTRAST TECHNIQUE: Multiplanar, multiecho pulse sequences of the brain and surrounding structures were obtained without intravenous contrast. COMPARISON:  Head CT 11/24/2009.  MRI 03/08/2009 FINDINGS: Brain: Diffusion imaging does not show any acute or subacute infarction. Brainstem and cerebellum are normal. Cerebral hemispheres show moderate scattered punctate foci of T2 and FLAIR signal within the deep and subcortical white matter consistent with an early manifestation of small vessel disease. Compared to the study of 2010, these are progressive. No cortical or large vessel territory infarction. No mass lesion, hemorrhage, hydrocephalus or extra-axial collection. Vascular: Major vessels at the base of the brain show flow. Skull and upper cervical spine: Negative Sinuses/Orbits: Clear/normal Other: None IMPRESSION: Progressive foci of T2 and FLAIR signal within the cerebral hemispheric deep and subcortical white matter, when compared to the study of 2010. Most likely diagnosis is progressive small-vessel change of the white matter. The differential diagnosis does include demyelinating disease or migraine related foci, but those are felt less likely. Electronically  Signed   By: Chelsey Hunt M.D.   On: 11/21/2018 11:33    Assessment & Plan:   Chelsey Hunt was seen today for hypothyroidism and annual exam.  Diagnoses and all orders for this visit:  Essential hypertension- Her blood pressure is adequately well controlled.  Electrolytes and renal function are normal. -     Basic metabolic panel; Future  Acquired hypothyroidism- Her TSH is in the normal range.  She will remain on the current dose of levothyroxine. -     TSH; Future  Hypercalcemia  Routine general medical examination at a health care facility- Exam completed, labs reviewed, Pap and colon cancer screening are up-to-date, she is referred for screening mammogram, patient education material was given. -     Lipid panel; Future -  HIV Antibody (routine testing w rflx); Future  Visit for screening mammogram -     MM DIGITAL SCREENING BILATERAL; Future  Intractable migraine without aura and without status migrainosus -     Ubrogepant (UBRELVY) 50 MG TABS; Take 1 tablet by mouth once for 1 dose.   I have discontinued Crystel Cousar's Lasmiditan Succinate, co-enzyme Q-10, and Rimegepant Sulfate. I am also having her start on Ubrelvy. Additionally, I am having her maintain her pantoprazole, acetaminophen, ibuprofen, Plecanatide, levothyroxine, and Fremanezumab-vfrm.  Meds ordered this encounter  Medications   Ubrogepant (UBRELVY) 50 MG TABS    Sig: Take 1 tablet by mouth once for 1 dose.    Dispense:  10 tablet    Refill:  5     Follow-up: Return in about 6 months (around 09/03/2019).  [name], MD

## 2019-03-05 ENCOUNTER — Encounter: Payer: Self-pay | Admitting: Internal Medicine

## 2019-03-05 LAB — HIV ANTIBODY (ROUTINE TESTING W REFLEX): HIV 1&2 Ab, 4th Generation: NONREACTIVE

## 2019-03-09 MED FILL — PANTOPRAZOLE SOD DR 40 MG T: 40 | 90 days supply | Qty: 180 | Fill #0

## 2019-03-19 ENCOUNTER — Telehealth: Payer: Self-pay | Admitting: Neurology

## 2019-03-19 NOTE — Telephone Encounter (Signed)
New Message  Patient is calling to find out if her FMLA paperwork is complete.  I do not see a note stating when paperwork was dropped off or if it is completed.  Please f/u with pt

## 2019-03-24 NOTE — Telephone Encounter (Signed)
Called and LMOVM advising Pt when the paperwork is ready, I will contact her.

## 2019-03-25 ENCOUNTER — Other Ambulatory Visit: Payer: Self-pay

## 2019-03-25 ENCOUNTER — Ambulatory Visit (INDEPENDENT_AMBULATORY_CARE_PROVIDER_SITE_OTHER): Payer: 59 | Admitting: Internal Medicine

## 2019-03-25 ENCOUNTER — Encounter: Payer: Self-pay | Admitting: Internal Medicine

## 2019-03-25 VITALS — BP 120/84 | HR 78 | Temp 98.1°F | Resp 16 | Ht 60.0 in | Wt 137.5 lb

## 2019-03-25 DIAGNOSIS — T148XXA Other injury of unspecified body region, initial encounter: Secondary | ICD-10-CM | POA: Diagnosis not present

## 2019-03-25 MED ORDER — CYCLOBENZAPRINE HCL 5 MG PO TABS: 5.0000 mg | ORAL_TABLET | Freq: Three times a day (TID) | ORAL | 1 refills | Status: DC | PRN

## 2019-03-25 MED FILL — CYCLOBENZAPRINE 5 MG TABLET: 5 | 10 days supply | Qty: 30 | Fill #0

## 2019-03-25 NOTE — Patient Instructions (Signed)
Musculoskeletal Pain Musculoskeletal pain refers to aches and pains in your bones, joints, muscles, and the tissues that surround them. This pain can occur in any part of the body. It can last for a short time (acute) or a long time (chronic). A physical exam, lab tests, and imaging studies may be done to find the cause of your musculoskeletal pain. Follow these instructions at home:  Lifestyle  Try to control or lower your stress levels. Stress increases muscle tension and can worsen musculoskeletal pain. It is important to recognize when you are anxious or stressed and learn ways to manage it. This may include: ? Meditation or yoga. ? Cognitive or behavioral therapy. ? Acupuncture or massage therapy.  You may continue all activities unless the activities cause more pain. When the pain gets better, slowly resume your normal activities. Gradually increase the intensity and duration of your activities or exercise. Managing pain, stiffness, and swelling  Take over-the-counter and prescription medicines only as told by your health care provider.  When your pain is severe, bed rest may be helpful. Lie or sit in any position that is comfortable, but get out of bed and walk around at least every couple of hours.  If directed, apply heat to the affected area as often as told by your health care provider. Use the heat source that your health care provider recommends, such as a moist heat pack or a heating pad. ? Place a towel between your skin and the heat source. ? Leave the heat on for 20-30 minutes. ? Remove the heat if your skin turns bright red. This is especially important if you are unable to feel pain, heat, or cold. You may have a greater risk of getting burned.  If directed, put ice on the painful area. ? Put ice in a plastic bag. ? Place a towel between your skin and the bag. ? Leave the ice on for 20 minutes, 2-3 times a day. General instructions  Your health care provider may  recommend that you see a physical therapist. This person can help you come up with a safe exercise program. Do any exercises as told by your physical therapist.  Keep all follow-up visits, including any physical therapy visits, as told by your health care providers. This is important. Contact a health care provider if:  Your pain gets worse.  Medicines do not help ease your pain.  You cannot use the part of your body that hurts, such as your arm, leg, or neck.  You have trouble sleeping.  You have trouble doing your normal activities. Get help right away if:  You have a new injury and your pain is worse or different.  You feel numb or you have tingling in the painful area. Summary  Musculoskeletal pain refers to aches and pains in your bones, joints, muscles, and the tissues that surround them.  This pain can occur in any part of the body.  Your health care provider may recommend that you see a physical therapist. This person can help you come up with a safe exercise program. Do any exercises as told by your physical therapist.  Lower your stress level. Stress can worsen musculoskeletal pain. Ways to lower stress may include meditation, yoga, cognitive or behavioral therapy, acupuncture, and massage therapy. This information is not intended to replace advice given to you by your health care provider. Make sure you discuss any questions you have with your health care provider. Document Released: 08/27/2005 Document Revised: 08/09/2017 Document Reviewed:   09/26/2016 Elsevier Patient Education  2020 [employer]  

## 2019-03-26 DIAGNOSIS — K602 Anal fissure, unspecified: Secondary | ICD-10-CM | POA: Diagnosis not present

## 2019-03-27 NOTE — Progress Notes (Addendum)
Subjective:  Patient ID: Chelsey Hunt, female    DOB: 12/27/1965  Age: 53 y.o. MRN: [id number]  CC: Muscle Pain   HPI Chelsey Hunt presents for f/up - She complains of a 2-day history of diffuse musculoskeletal pain, spasms, and knots around her neck, shoulders, shoulder blades, and trapezius.  The symptoms are little worse on the left than the right.  The symptoms started after she was doing some reaching.  She has gotten some symptom relief with ibuprofen.  There is no pain that radiates to the arms or legs and she denies paresthesias.  Outpatient Medications Prior to Visit  Medication Sig Dispense Refill   acetaminophen (TYLENOL) 325 MG tablet Take 650 mg by mouth every 6 (six) hours as needed for mild pain.     Fremanezumab-vfrm (AJOVY) 225 MG/1.5ML SOAJ Inject 225 mg into the skin every 30 (thirty) days. 1 pen 11   ibuprofen (ADVIL,MOTRIN) 200 MG tablet Take 200 mg by mouth every 8 (eight) hours as needed.     levothyroxine (SYNTHROID) 75 MCG tablet Take 1 tablet (75 mcg total) by mouth daily. 90 tablet 0   pantoprazole (PROTONIX) 40 MG tablet Take 40 mg by mouth 2 (two) times daily.     Plecanatide (TRULANCE) 3 MG TABS Take 1 tablet by mouth daily. 90 tablet 1   No facility-administered medications prior to visit.     ROS Review of Systems  Constitutional: Negative.  Negative for diaphoresis.  HENT: Negative.  Negative for trouble swallowing.   Eyes: Negative.   Respiratory: Negative for cough, shortness of breath and wheezing.   Cardiovascular: Negative for chest pain, palpitations and leg swelling.  Gastrointestinal: Negative for abdominal pain, diarrhea, nausea and vomiting.  Endocrine: Negative.   Genitourinary: Negative.  Negative for difficulty urinating.  Musculoskeletal: Positive for arthralgias, myalgias and neck pain. Negative for back pain.  Skin: Negative.  Negative for rash.  Neurological: Negative.  Negative for weakness, numbness and  headaches.  Hematological: Negative for adenopathy. Does not bruise/bleed easily.  Psychiatric/Behavioral: Negative.     Objective:  BP 120/84 (BP Location: Right Arm, Patient Position: Sitting, Cuff Size: Normal)    Pulse 78    Temp 98.1 F (36.7 C) (Oral)    Resp 16    Ht 5' (1.524 m)    Wt 137 lb 8 oz (62.4 kg)    LMP 04/20/2013    SpO2 98%    BMI 26.85 kg/m   BP Readings from Last 3 Encounters:  03/25/19 120/84  03/04/19 120/80  01/29/19 106/79    Wt Readings from Last 3 Encounters:  03/25/19 137 lb 8 oz (62.4 kg)  03/04/19 137 lb (62.1 kg)  01/29/19 135 lb (61.2 kg)    Physical Exam Constitutional:      General: She is not in acute distress.    Appearance: She is not toxic-appearing.  HENT:     Nose: Nose normal.     Mouth/Throat:     Mouth: Mucous membranes are moist.  Eyes:     Conjunctiva/sclera: Conjunctivae normal.  Neck:     Musculoskeletal: Normal range of motion and neck supple. No neck rigidity or muscular tenderness.  Cardiovascular:     Rate and Rhythm: Normal rate and regular rhythm.     Heart sounds: No murmur.  Pulmonary:     Effort: Pulmonary effort is normal.     Breath sounds: Normal breath sounds. No wheezing or rhonchi.  Abdominal:     General: Abdomen is protuberant. There  is no distension.     Palpations: There is no hepatomegaly, splenomegaly or mass.     Tenderness: There is no abdominal tenderness.  Musculoskeletal: Normal range of motion.     Right shoulder: Normal. She exhibits normal range of motion, no tenderness and no deformity.     Left shoulder: Normal. She exhibits normal range of motion, no tenderness and no deformity.     Cervical back: She exhibits spasm. She exhibits normal range of motion, no tenderness, no bony tenderness, no swelling, no edema, no deformity and no pain.     Right lower leg: No edema.     Left lower leg: No edema.  Lymphadenopathy:     Cervical: No cervical adenopathy.  Skin:    General: Skin is warm and  dry.  Neurological:     General: No focal deficit present.     Mental Status: She is alert and oriented to person, place, and time. Mental status is at baseline.     Motor: Motor function is intact. No weakness.     Coordination: Coordination is intact.     Deep Tendon Reflexes: Reflexes are normal and symmetric.  Psychiatric:        Mood and Affect: Mood normal.        Behavior: Behavior normal.     Lab Results  Component Value Date   WBC 6.0 10/24/2018   HGB 14.3 10/24/2018   HCT 43.0 10/24/2018   PLT 356.0 10/24/2018   GLUCOSE 89 03/04/2019   CHOL 215 (H) 03/04/2019   TRIG 69.0 03/04/2019   HDL 65.60 03/04/2019   LDLDIRECT 133.5 10/23/2013   LDLCALC 135 (H) 03/04/2019   ALT 29 01/08/2019   AST 22 01/08/2019   NA 139 03/04/2019   K 3.7 03/04/2019   CL 103 03/04/2019   CREATININE 0.88 03/04/2019   BUN 12 03/04/2019   CO2 26 03/04/2019   TSH 0.36 03/04/2019   HGBA1C 5.9 07/09/2018    Mr Brain Wo Contrast  Result Date: 11/21/2018 CLINICAL DATA:  Headache.  Tremors.  Nausea and confusion. EXAM: MRI HEAD WITHOUT CONTRAST TECHNIQUE: Multiplanar, multiecho pulse sequences of the brain and surrounding structures were obtained without intravenous contrast. COMPARISON:  Head CT 11/24/2009.  MRI 03/08/2009 FINDINGS: Brain: Diffusion imaging does not show any acute or subacute infarction. Brainstem and cerebellum are normal. Cerebral hemispheres show moderate scattered punctate foci of T2 and FLAIR signal within the deep and subcortical white matter consistent with an early manifestation of small vessel disease. Compared to the study of 2010, these are progressive. No cortical or large vessel territory infarction. No mass lesion, hemorrhage, hydrocephalus or extra-axial collection. Vascular: Major vessels at the base of the brain show flow. Skull and upper cervical spine: Negative Sinuses/Orbits: Clear/normal Other: None IMPRESSION: Progressive foci of T2 and FLAIR signal within the  cerebral hemispheric deep and subcortical white matter, when compared to the study of 2010. Most likely diagnosis is progressive small-vessel change of the white matter. The differential diagnosis does include demyelinating disease or migraine related foci, but those are felt less likely. Electronically Signed   By: Nelson Chimes M.D.   On: 11/21/2018 11:33    Assessment & Plan:   Chelsey Hunt was seen today for muscle pain.  Diagnoses and all orders for this visit:  Musculoskeletal strain -     cyclobenzaprine (FLEXERIL) 5 MG tablet; Take 1 tablet (5 mg total) by mouth 3 (three) times daily as needed for muscle spasms.   I am having  Chelsey Hunt start on cyclobenzaprine. I am also having her maintain her pantoprazole, acetaminophen, ibuprofen, Plecanatide, levothyroxine, and Fremanezumab-vfrm.  Meds ordered this encounter  Medications   cyclobenzaprine (FLEXERIL) 5 MG tablet    Sig: Take 1 tablet (5 mg total) by mouth 3 (three) times daily as needed for muscle spasms.    Dispense:  30 tablet    Refill:  1     Follow-up: Return if symptoms worsen or fail to improve.  [name], MD

## 2019-04-10 ENCOUNTER — Ambulatory Visit (INDEPENDENT_AMBULATORY_CARE_PROVIDER_SITE_OTHER)
Admission: RE | Admit: 2019-04-10 | Discharge: 2019-04-10 | Disposition: A | Discharge status: Home / Self Care | Payer: 59 | Source: Ambulatory Visit | Attending: Family | Admitting: Family

## 2019-04-10 ENCOUNTER — Other Ambulatory Visit: Payer: Self-pay

## 2019-04-10 ENCOUNTER — Encounter: Payer: Self-pay | Admitting: Family

## 2019-04-10 ENCOUNTER — Telehealth: Payer: Self-pay | Admitting: Emergency Medicine

## 2019-04-10 ENCOUNTER — Ambulatory Visit (INDEPENDENT_AMBULATORY_CARE_PROVIDER_SITE_OTHER): Payer: 59 | Admitting: Family

## 2019-04-10 VITALS — BP 124/82 | HR 99 | Temp 98.4°F | Ht 60.0 in | Wt 136.0 lb

## 2019-04-10 DIAGNOSIS — M25512 Pain in left shoulder: Secondary | ICD-10-CM

## 2019-04-10 DIAGNOSIS — T148XXA Other injury of unspecified body region, initial encounter: Secondary | ICD-10-CM | POA: Diagnosis not present

## 2019-04-10 DIAGNOSIS — M542 Cervicalgia: Secondary | ICD-10-CM | POA: Diagnosis not present

## 2019-04-10 MED ORDER — CYCLOBENZAPRINE HCL 5 MG PO TABS: 5.0000 mg | ORAL_TABLET | Freq: Three times a day (TID) | ORAL | 1 refills | Status: DC | PRN

## 2019-04-10 MED ORDER — METHYLPREDNISOLONE 4 MG PO TBPK: ORAL_TABLET | ORAL | 0 refills | Status: DC

## 2019-04-10 MED FILL — METHYLPREDNISOLONE 4 MG TAB: 4 | 6 days supply | Qty: 21 | Fill #0

## 2019-04-10 MED FILL — CYCLOBENZAPRINE 5 MG TABLET: 5 | 10 days supply | Qty: 30 | Fill #0

## 2019-04-10 NOTE — Telephone Encounter (Signed)
Pt is requesting to switch to female provider. Are you both ok with this?

## 2019-04-10 NOTE — Progress Notes (Signed)
Chelsey Hunt is a 53 y.o. female with the following history as recorded in Epic[employer]:  Patient Active Problem List   Diagnosis Date Noted  . Musculoskeletal strain 03/25/2019  . Visit for screening mammogram 03/04/2019  . Chronic idiopathic constipation 01/08/2019  . Essential hypertension 10/24/2018  . Psychophysiological insomnia 07/07/2018  . Seasonal allergic rhinitis due to pollen 08/08/2017  . Intractable migraine without aura and without status migrainosus 07/02/2016  . Hyperglycemia 11/28/2015  . Pancreas divisum of native pancreas 12/27/2014  . Routine general medical examination at a health care facility 07/30/2012  . GERD (gastroesophageal reflux disease)   . Hypothyroidism 08/12/2009    Current Outpatient Medications  Medication Sig Dispense Refill  . acetaminophen (TYLENOL) 325 MG tablet Take 650 mg by mouth every 6 (six) hours as needed for mild pain.    . cyclobenzaprine (FLEXERIL) 5 MG tablet Take 1 tablet (5 mg total) by mouth 3 (three) times daily as needed for muscle spasms. 30 tablet 1  . Fremanezumab-vfrm (AJOVY) 225 MG/1.5ML SOAJ Inject 225 mg into the skin every 30 (thirty) days. 1 pen 11  . ibuprofen (ADVIL,MOTRIN) 200 MG tablet Take 200 mg by mouth every 8 (eight) hours as needed.    Marland Kitchen levothyroxine (SYNTHROID) 75 MCG tablet Take 1 tablet (75 mcg total) by mouth daily. 90 tablet 0  . pantoprazole (PROTONIX) 40 MG tablet Take 40 mg by mouth 2 (two) times daily.    Marland Kitchen Plecanatide (TRULANCE) 3 MG TABS Take 1 tablet by mouth daily. 90 tablet 1  . methylPREDNISolone (MEDROL DOSEPAK) 4 MG TBPK tablet Taper as directed 21 tablet 0   No current facility-administered medications for this visit.     Allergies: Imitrex [sumatriptan], Propoxyphene n-acetaminophen, Nabumetone, and Topiramate  Past Medical History:  Diagnosis Date  . ACID REFLUX DISEASE 01/03/2008  . Anemia   . ANEMIA, IRON DEFICIENCY 12/23/2008   history  . Chronic tension type headache 01/13/2009   migraines  . Complication of anesthesia    pt states she just needs a small amount or she will sleep too long  . Dysplasia of cervix, low grade (CIN 1)   . Fibroid   . GERD (gastroesophageal reflux disease)   . HEART MURMUR, SYSTOLIC 58/05/9832   will have echo 06/18/2013  . 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 Irritable bowel syndrome 12/23/2008  . Migraine   . Ovarian cyst   . Ovarian cyst   . THYROID NODULE, RIGHT 04/21/2009    Past Surgical History:  Procedure Laterality Date  . ABDOMINAL HYSTERECTOMY    . COLONOSCOPY    . HYSTEROSCOPY    . LAPAROSCOPY    . NOVASURE ABLATION    . POLYPECTOMY    . TUBAL LIGATION    . UPPER GI ENDOSCOPY    . VAGINAL HYSTERECTOMY N/A 06/24/2013   Procedure: TOTAL HYSTERECTOMY VAGINAL;  Surgeon: Alwyn Pea, MD;  Location: [facility];  Service: Gynecology;  Laterality: N/A;    Family History  Problem Relation Age of Onset  . Goiter Mother   . Heart disease Mother   . Thyroid disease Mother   . Heart attack Mother   . Graves' disease Sister   . Asthma Sister   . Cancer Father   . Heart disease Father   . Heart attack Father   . CVA Brother   . Hypertension Brother   . Hypertension Brother   . Alcoholism Brother   . Stomach cancer Sister   . Hepatitis Sister   . Stomach cancer  Sister   . Goiter Other        Siblings 2  . Hypertension Paternal Grandfather   . Depression Neg Hx   . Alcohol abuse Neg Hx   . Drug abuse Neg Hx   . Early death Neg Hx   . Hearing loss Neg Hx   . Kidney disease Neg Hx   . Stroke Neg Hx     Social History   Tobacco Use  . Smoking status: Never Smoker  . Smokeless tobacco: Never Used  Substance Use Topics  . 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  Head: Normocephalic and atraumatic  Lungs: Respirations unlabored; Musculoskeletal: No deformities; no active joint inflammation  Extremities: No edema, cyanosis, clubbing  Vessels: Symmetric bilaterally  Neurologic: Alert and oriented; speech intact; face symmetrical; moves all extremities well; CNII-XII intact without focal deficit   Assessment:  1. Left shoulder pain, unspecified chronicity   2. Musculoskeletal strain     Plan:  ? Cervical spine source; check left shoulder and cervical spine X-ray; apply heat to affected area; Rx for Medrol Dose pak and Flexeril; follow up to be determined.   No follow-ups on file.  Orders Placed This Encounter  Procedures  . DG Cervical Spine Complete    Standing Status:   Future    Standing Expiration Date:   06/09/2020    Order Specific Question:   Reason for Exam (SYMPTOM  OR DIAGNOSIS REQUIRED)    Answer:   left shoulder pain    Order Specific Question:   Is patient pregnant?    Answer:   No    Order Specific Question:   Preferred imaging location?    Answer:   [facility]    Order Specific Question:   Radiology Contrast Protocol - do NOT remove file path    Answer:   [email or web address]  . DG Shoulder Left    Standing Status:   Future    Standing Expiration Date:   06/09/2020    Order Specific Question:   Reason for Exam (SYMPTOM  OR DIAGNOSIS REQUIRED)    Answer:   left shoulder pain    Order Specific Question:   Is patient pregnant?    Answer:   No    Order Specific Question:   Preferred imaging location?     Answer:   [facility]    Order Specific Question:   Radiology Contrast Protocol - do NOT remove file path    Answer:   [email or web address]    Requested Prescriptions   Signed Prescriptions Disp Refills  . methylPREDNISolone (MEDROL DOSEPAK) 4 MG TBPK tablet 21 tablet 0    Sig: Taper as directed  . cyclobenzaprine (FLEXERIL) 5 MG tablet 30 tablet 1    Sig: Take 1 tablet (5 mg total)  by mouth 3 (three) times daily as needed for muscle spasms.

## 2019-04-11 NOTE — Telephone Encounter (Signed)
Yes, I am ok with this 

## 2019-04-13 NOTE — Telephone Encounter (Addendum)
Okay- thanks.  Please don't call until after 3 pm- works nights.

## 2019-04-14 ENCOUNTER — Other Ambulatory Visit: Payer: Self-pay | Admitting: Internal Medicine

## 2019-04-14 DIAGNOSIS — E039 Hypothyroidism, unspecified: Secondary | ICD-10-CM

## 2019-04-14 MED FILL — SYNTHROID 75 MCG TABLET: 75 | 90 days supply | Qty: 90 | Fill #0

## 2019-04-22 DIAGNOSIS — K219 Gastro-esophageal reflux disease without esophagitis: Secondary | ICD-10-CM | POA: Diagnosis not present

## 2019-04-22 DIAGNOSIS — K581 Irritable bowel syndrome with constipation: Secondary | ICD-10-CM | POA: Diagnosis not present

## 2019-05-04 NOTE — Progress Notes (Signed)
NEUROLOGY FOLLOW UP OFFICE NOTE  Chelsey Hunt [id number]  HISTORY OF PRESENT ILLNESS: Chelsey Hunt is a 53 year old right-handed woman with hypothyroidism who follows up for migraines.  UPDATE: She used the sample of Ajovy but then her insurance had not approved it.  So, she has not had the Ajovy in 2 months.  She has not tried Nurtec yet Intensity:  Moderate to severe Duration:  2 days Frequency:  8 days a month following Ajovy but increased this past month.  Current NSAIDS: Advil Current analgesics: Tylenol Current triptans:None Current ergotamine: None Current abortive anti-CGRP: none Current anti-emetic: Promethazine Current muscle relaxants: Cyclobenzaprine 10 mg Current anti-anxiolytic: None Current sleep aide: None Current Antihypertensive medications: None Current Antidepressant medications: None Current Anticonvulsant medications: None Current anti-CGRP: Ajovy, Nurtec (not yet tried) Current Vitamins/Herbal/Supplements: Co Q-10 Current Antihistamines/Decongestants: None Other therapy: None Hormone/birth control: None  Caffeine: No Diet: 5-6 bottles of water daily Exercise: Not routine Depression: No; Anxiety: Yes Other pain:neck pain.  She has cervical spondylosis Sleep hygiene: Poor  HISTORY: Onset: First migraine was between 37 and 61 years old. Migraine free until 2004 during her menstrual cycle. Chronic for many years. Location:Left more than right. Quality:throbbing Initial intensity:Severe.  Aura:no Prodrome:no Postdrome:fatigue Associated symptoms:  Right facial tingling, nausea, photophobia, phonophobia, osmophobia.Shedenies associated vomiting, visual disturbance orunilateral numbness or weakness. Initial duration:3 days InitialFrequency:daily InitialFrequency of abortive medication:Advil or Tylenol daily, Maxalt 2 days a week Triggers:  Certain smells, sausages, bananas Relieving factors: Exercise  Activity:aggravates During summer 2019, she has had throbbing occipital headache associated with left facial numbness, facial twitching. She started having a panic attack with palpitations. She went to the ED on 06/26/18 for symptoms. EKG showed sinus tachycardia. She was treated with headache cocktail  Past NSAIDS:Ibuprofen, naproxen, Cambia Past analgesics:Excedrin, Lidocaine nasal Past abortive triptans:Relpax, sumatriptantablet, Maxalt, Tosymra NS, Zembrace. Past abortive ergotamine:Migranal Past muscle relaxants:none Past anti-emetic:Zofran Past antihypertensive medications:Verapamil, propranolol Past antidepressant medications:Imipramine, nortriptyline Past anticonvulsant medications:topiramate Past anti-CGRP:Aimovig (aggravated migraine), Emgality (ineffective), Ubrelvy (ineffective) Past vitamins/Herbal/Supplements:Riboflavin, magnesium Past antihistamines/decongestants:None Other past therapies:acupuncture, Reyvow  Family history of headache:Mom (migraines), daughter (migraines)  MRI and MRA of the head from 03/09/2009 was personally reviewed and revealed nonspecific small subcortical white matter hyperintensities and 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  To evaluate worsening headaches, she had an MRI of the brain on 11/21/2018 which demonstrated nonspecific white matter hyperintensities in the cerebral white matter, likely chronic small vessel ischemic changes, but no acute abnormality.  PAST MEDICAL HISTORY: Past Medical History:  Diagnosis Date  . ACID REFLUX DISEASE 01/03/2008  . Anemia   . ANEMIA, IRON DEFICIENCY 12/23/2008   history  . Chronic tension type headache 01/13/2009   migraines  . Complication of anesthesia    pt states she just needs a small amount or she will sleep too long  . Dysplasia of cervix, low grade (CIN 1)   . Fibroid   . GERD (gastroesophageal reflux disease)   .  HEART MURMUR, SYSTOLIC [id number]   will have echo 06/18/2013  . 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 Irritable bowel syndrome 12/23/2008  . Migraine   . Ovarian cyst   . Ovarian cyst   . THYROID NODULE, RIGHT 04/21/2009    MEDICATIONS: Current Outpatient Medications on File Prior to Visit  Medication Sig Dispense Refill  . acetaminophen (TYLENOL) 325 MG tablet Take 650 mg by mouth every 6 (six) hours as needed for mild pain.    . cyclobenzaprine (FLEXERIL)  5 MG tablet Take 1 tablet (5 mg total) by mouth 3 (three) times daily as needed for muscle spasms. 30 tablet 1  . Fremanezumab-vfrm (AJOVY) 225 MG/1.5ML SOAJ Inject 225 mg into the skin every 30 (thirty) days. 1 pen 11  . ibuprofen (ADVIL,MOTRIN) 200 MG tablet Take 200 mg by mouth every 8 (eight) hours as needed.    . methylPREDNISolone (MEDROL DOSEPAK) 4 MG TBPK tablet Taper as directed 21 tablet 0  . pantoprazole (PROTONIX) 40 MG tablet Take 40 mg by mouth 2 (two) times daily.    Marland Kitchen Plecanatide (TRULANCE) 3 MG TABS Take 1 tablet by mouth daily. 90 tablet 1  . SYNTHROID 75 MCG tablet TAKE 1 TABLET BY MOUTH DAILY. 90 tablet 0   No current facility-administered medications on file prior to visit.     ALLERGIES: Allergies  Allergen Reactions  . Imitrex [Sumatriptan] Palpitations  . Propoxyphene N-Acetaminophen   . Nabumetone Rash    REACTION: GI upset  . Topiramate Rash    REACTION: rash    FAMILY HISTORY: Family History  Problem Relation Age of Onset  . Goiter Mother   . Heart disease Mother   . Thyroid disease Mother   . Heart attack Mother   . Graves' disease Sister   . Asthma Sister   . Cancer Father   . Heart disease Father   . Heart attack Father   . CVA Brother   . Hypertension Brother   . Hypertension Brother   . Alcoholism Brother   . Stomach cancer Sister   . Hepatitis Sister   . Stomach cancer Sister   . Goiter Other        Siblings 2  . Hypertension Paternal Grandfather   . Depression Neg Hx    . Alcohol abuse Neg Hx   . Drug abuse Neg Hx   . Early death Neg Hx   . Hearing loss Neg Hx   . Kidney disease Neg Hx   . Stroke Neg Hx     SOCIAL HISTORY: Social History   Socioeconomic History  . Marital status: Married    Spouse name: Shanon Brow  . Number of children: 3  . Years of education: Not on file  . Highest education level: Associate degree: occupational, Hotel manager, or vocational program  Occupational History  . Occupation: WOMENS    Employer: [employer]  . Financial resource strain: Not on file  . Food insecurity    Worry: Not on file    Inability: Not on file  . Transportation needs    Medical: Not on file    Non-medical: Not on file  Tobacco Use  . Smoking status: Never Smoker  . Smokeless tobacco: Never Used  Substance and Sexual Activity  . Alcohol use: No    Comment: occ  . Drug use: No  . Sexual activity: Yes    Birth control/protection: Surgical    Comment: BTL  Lifestyle  . Physical activity    Days per week: Not on file    Minutes per session: Not on file  . Stress: Not on file  Relationships  . Social Herbalist on phone: Not on file    Gets together: Not on file    Attends religious service: Not on file    Active member of club or organization: Not on file    Attends meetings of clubs or organizations: Not on file    Relationship status: Not on file  . Intimate  partner violence    Fear of current or ex partner: Not on file    Emotionally abused: Not on file    Physically abused: Not on file    Forced sexual activity: Not on file  Other Topics Concern  . Not on file  Social History Narrative   Regular exercise-yes      Patient is right-handed.She lives with her husband in a 1 story home. She has recently been avoiding caffeine. She has been unable to exercise lately due to a back injury.    REVIEW OF SYSTEMS: Constitutional: No fevers, chills, or sweats, no generalized fatigue, change in appetite Eyes: No  visual changes, double vision, eye pain Ear, nose and throat: No hearing loss, ear pain, nasal congestion, sore throat Cardiovascular: No chest pain, palpitations Respiratory:  No shortness of breath at rest or with exertion, wheezes GastrointestinaI: No nausea, vomiting, diarrhea, abdominal pain, fecal incontinence Genitourinary:  No dysuria, urinary retention or frequency Musculoskeletal:  No neck pain, back pain Integumentary: No rash, pruritus, skin lesions Neurological: as above Psychiatric: No depression, insomnia, anxiety Endocrine: No palpitations, fatigue, diaphoresis, mood swings, change in appetite, change in weight, increased thirst Hematologic/Lymphatic:  No purpura, petechiae. Allergic/Immunologic: no itchy/runny eyes, nasal congestion, recent allergic reactions, rashes  PHYSICAL EXAM: Blood pressure 109/71, pulse 74, temperature 98 F (36.7 C), height 5' (1.524 m), weight 137 lb (62.1 kg), last menstrual period 04/20/2013, SpO2 96 %. General: No acute distress.  Patient appears well-groomed.   Head:  Normocephalic/atraumatic Eyes:  Fundi examined but not visualized Neck: supple, paraspinal tenderness, full range of motion Heart:  Regular rate and rhythm Lungs:  Clear to auscultation bilaterally Back: No paraspinal tenderness Neurological Exam: alert and oriented to person, place, and time. Attention span and concentration intact, recent and remote memory intact, fund of knowledge intact.  Speech fluent and not dysarthric, language intact.  CN II-XII intact. Bulk and tone normal, muscle strength 5/5 throughout.  Sensation to light touch  intact.  Deep tendon reflexes 2+ throughout.  Finger to nose testing intact.  Gait normal, Romberg negative.  IMPRESSION: 1.  Migraine without aura, without status migrainosus, not intractable.  Did improve on Ajovy 2.  Cervical spondylosis  PLAN: 1.  For preventative management, will submit for approval for Ajovy (it has been effective)  2.  For abortive therapy, will try Nurtec.  If effective, she will contact us for prescription 3. Flexeril at bedtime PRN for neck pain 4.  Limit use of pain relievers to no more than 2 days out of week to prevent risk of rebound or medication-overuse headache. 5.  Keep headache diary 6.  Exercise, hydration, caffeine cessation, sleep hygiene, monitor for and avoid triggers 7.  Consider:  magnesium citrate 400mg  daily, riboflavin 400mg  daily, and coenzyme Q10 100mg  three times daily 8. Always keep in mind that currently taking a hormone or birth control may be a possible trigger or aggravating factor for migraine. 9. Follow up 4 months.  Metta Clines, DO

## 2019-05-05 ENCOUNTER — Ambulatory Visit: Payer: 59 | Admitting: Neurology

## 2019-05-05 ENCOUNTER — Encounter: Payer: Self-pay | Admitting: Neurology

## 2019-05-05 ENCOUNTER — Encounter: Payer: Self-pay | Admitting: *Deleted

## 2019-05-05 ENCOUNTER — Other Ambulatory Visit: Payer: Self-pay

## 2019-05-05 VITALS — BP 109/71 | HR 74 | Temp 98.0°F | Ht 60.0 in | Wt 137.0 lb

## 2019-05-05 DIAGNOSIS — G43019 Migraine without aura, intractable, without status migrainosus: Secondary | ICD-10-CM | POA: Diagnosis not present

## 2019-05-05 DIAGNOSIS — M47812 Spondylosis without myelopathy or radiculopathy, cervical region: Secondary | ICD-10-CM

## 2019-05-05 NOTE — Progress Notes (Addendum)
[name] - [place] Case [id number] - Rx #[id number] Need help? Call us at [phone] Outcome Approvedon August 26 The request has been approved. The authorization is effective for a maximum of 6 fills from 05/04/2019 to 11/03/2019, as long as the member is enrolled in their current health plan. The request was approved as submitted. This request is approved for 1.33mL (1 pen) per month. Renewal requires that the patient has experienced a reduction in migraine or headache frequency of at least 2 days per month, OR that the patient has experienced a reduction in migraine severity OR migraine duration with Ajovy therapy. A written notification letter will follow with additional details. Drug Ajovy 225MG /1.5ML auto-injectors Form [employer] ePA Form Original Claim Info [place] [name] - [place] Case ID: [id number] - Rx #: [id number] Need help? Call us at [phone]  Status  Sent to [employer]  DrugAjovy 225MG /1.5ML auto-injectors  FormMedImpact ePA Form  Original Claim Info75  [employer] is reviewing your PA request. You may close this dialog, return to your dashboard, and perform other tasks. To check for an update later, open this request again from your dashboard. If [employer] has not replied within 24 hours for urgent requests or within 48 hours for standard requests, please contact [employer] at [phone]. Roxy Horseman (Key: [id number]) Rx #: [id number] Aimovig 140MG /ML auto-injectors   Form [facility] Electronic Prior Authorization Form (2017 NCPDP) Created 26 days ago Sent to Plan 1 hour ago Plan Response 1 hour ago Submit Clinical Questions Expires 3 days from now

## 2019-05-05 NOTE — Patient Instructions (Signed)
1.  We will get you Ajovy 2.  When you get another migraine, try the Nurtec.  1 tablet (cannot repeat for 24 hours).  If helpful, contact us and we can send in a prescription. 3.  Use Flexeril at bedtime for neck pain if needed. 4.  Follow up in 4 months

## 2019-05-07 ENCOUNTER — Telehealth: Payer: Self-pay | Admitting: *Deleted

## 2019-05-07 MED FILL — AJOVY 225 MG/1.5ML SOAJ: 225 | 30 days supply | Qty: 2 | Fill #0

## 2019-05-07 NOTE — Telephone Encounter (Signed)
Faxed prior auth request form (signed by Dr. Metta Clines)  to [facility] at [phone] for Ajovy autoinjectors 225mg /1.68ml.

## 2019-05-07 NOTE — Telephone Encounter (Signed)
Dr Tomi Likens completed patient's FMLA paperwork. Faxed to [employer] at [phone] with confirmation received. Called patient and made her aware it was sent. She is coming tomorrow to pick up a copy of it for her records.

## 2019-05-08 DIAGNOSIS — H52223 Regular astigmatism, bilateral: Secondary | ICD-10-CM | POA: Diagnosis not present

## 2019-06-06 ENCOUNTER — Telehealth (HOSPITAL_COMMUNITY): Payer: Self-pay

## 2019-06-06 NOTE — Telephone Encounter (Signed)
2150 - I received a message that Chelsey Hunt called, and I returned her call. She had a question regarding her daughter, who has a 57 month old baby. Her daughter has a rash that appeared suddenly this evening after visiting a family member. It is over her whole body including her breasts. She reports small bumps and some bumps that resemble mosquito bites. They plan to call the doctor in the morning for an appointment. In the meantime, Chelsey Hunt wanted to know if it was safe to take benadryl while breast feeding or pumping her milk. I could not verify the origin or type of skin reaction she was having, but if it's an allergic reaction, I recommended that breast feeding was still indicated. Also, I discussed that benadryl is an L2 in [facility] Mother's Milk and Medications with possible side effect of sedation or drowsiness, but otherwise no known pediatric concerns. I also recommended that cetirizine may be another alternative allergy medication that is probably compatible with breast feeding according to [facility]. I encouraged Chelsey Hunt to follow up tomorrow if any additional questions or concerns arise.

## 2019-06-12 MED FILL — AJOVY 225 MG/1.5ML SOAJ: 225 | 30 days supply | Qty: 2 | Fill #1

## 2019-06-15 ENCOUNTER — Other Ambulatory Visit: Payer: Self-pay

## 2019-06-15 DIAGNOSIS — Z20822 Contact with and (suspected) exposure to covid-19: Secondary | ICD-10-CM

## 2019-06-15 DIAGNOSIS — Z20828 Contact with and (suspected) exposure to other viral communicable diseases: Secondary | ICD-10-CM | POA: Diagnosis not present

## 2019-06-16 ENCOUNTER — Other Ambulatory Visit: Payer: Self-pay

## 2019-06-17 LAB — NOVEL CORONAVIRUS, NAA: SARS-CoV-2, NAA: DETECTED — AB

## 2019-06-30 ENCOUNTER — Encounter: Payer: Self-pay | Admitting: Family

## 2019-07-01 ENCOUNTER — Encounter: Payer: Self-pay | Admitting: Internal Medicine

## 2019-07-01 ENCOUNTER — Ambulatory Visit (INDEPENDENT_AMBULATORY_CARE_PROVIDER_SITE_OTHER): Payer: 59 | Admitting: Internal Medicine

## 2019-07-01 DIAGNOSIS — R21 Rash and other nonspecific skin eruption: Secondary | ICD-10-CM | POA: Diagnosis not present

## 2019-07-01 NOTE — Progress Notes (Signed)
Virtual Visit via Video Note  I connected with Chelsey Hunt on 07/01/19 at  3:00 PM EDT by a video enabled telemedicine application and verified that I am speaking with the correct person using two identifiers.   I discussed the limitations of evaluation and management by telemedicine and the availability of in person appointments. The patient expressed understanding and agreed to proceed.  The patient is currently at home and I am in the office.    No referring provider.    History of Present Illness: This is an acute visit for a rash.   3 days ago her legs started itching in the back of her calf -she scratched it.  She did try putting alcohol on it.  It has continued to bother her.  Yesterday morning she wore her compression socks, which are not new and after she started having itching above the knee.  There is an area of a small rash there.  The areas of rash are small, dry slightly itchy.  She denies any new products or anything known that could be causing it.  She denies rash elsewhere.  She did try an old cream from the dermatologist once and it seemed to help a little bit, but she was unsure if she could continue to use that.   The cream was triamcinolone-nystatin.   Social History   Socioeconomic History  . Marital status: Married    Spouse name: Shanon Brow  . Number of children: 3  . Years of education: Not on file  . Highest education level: Associate degree: occupational, Hotel manager, or vocational program  Occupational History  . Occupation: WOMENS    Employer: [employer]  . Financial resource strain: Not on file  . Food insecurity    Worry: Not on file    Inability: Not on file  . Transportation needs    Medical: Not on file    Non-medical: Not on file  Tobacco Use  . Smoking status: Never Smoker  . Smokeless tobacco: Never Used  Substance and Sexual Activity  . Alcohol use: No    Comment: occ  . Drug use: No  . Sexual activity: Yes    Birth  control/protection: Surgical    Comment: BTL  Lifestyle  . Physical activity    Days per week: Not on file    Minutes per session: Not on file  . Stress: Not on file  Relationships  . Social Herbalist on phone: Not on file    Gets together: Not on file    Attends religious service: Not on file    Active member of club or organization: Not on file    Attends meetings of clubs or organizations: Not on file    Relationship status: Not on file  Other Topics Concern  . Not on file  Social History Narrative   Regular exercise-yes      Patient is right-handed.She lives with her husband in a 1 story home. She has recently been avoiding caffeine. Walks.     Observations/Objective: Appears well in NAD Did not attempt to visualize the rash due to poor quality video  Assessment and Plan:  See Problem List for Assessment and Plan of chronic medical problems.   Follow Up Instructions:    I discussed the assessment and treatment plan with the patient. The patient was provided an opportunity to ask questions and all were answered. The patient agreed with the plan and demonstrated an understanding of the instructions.  The patient was advised to call back or seek an in-person evaluation if the symptoms worsen or if the condition fails to improve as anticipated.    Binnie Rail, MD

## 2019-07-01 NOTE — Assessment & Plan Note (Signed)
Nonspecific rash, no obvious cause Possible slight improvement with nystatin-triamcinolone cream she had at home, but she only tried it once and wanted to make sure that was something she could try or if she needed to do something different Likely steroid responsive no matter what the causes Continue nystatin-triamcinolone cream twice daily for several days Call if no improvement

## 2019-07-06 ENCOUNTER — Ambulatory Visit: Payer: Self-pay

## 2019-07-06 ENCOUNTER — Other Ambulatory Visit: Payer: Self-pay

## 2019-07-06 ENCOUNTER — Emergency Department (HOSPITAL_COMMUNITY)
Admission: EM | Admit: 2019-07-06 | Discharge: 2019-07-06 | Disposition: A | Discharge status: Home / Self Care | Payer: 59 | Attending: Emergency Medicine | Admitting: Emergency Medicine

## 2019-07-06 ENCOUNTER — Emergency Department (HOSPITAL_COMMUNITY): Payer: 59

## 2019-07-06 DIAGNOSIS — I10 Essential (primary) hypertension: Secondary | ICD-10-CM | POA: Insufficient documentation

## 2019-07-06 DIAGNOSIS — R072 Precordial pain: Secondary | ICD-10-CM | POA: Diagnosis not present

## 2019-07-06 DIAGNOSIS — E039 Hypothyroidism, unspecified: Secondary | ICD-10-CM | POA: Insufficient documentation

## 2019-07-06 DIAGNOSIS — Z79899 Other long term (current) drug therapy: Secondary | ICD-10-CM | POA: Insufficient documentation

## 2019-07-06 DIAGNOSIS — R0602 Shortness of breath: Secondary | ICD-10-CM | POA: Diagnosis not present

## 2019-07-06 DIAGNOSIS — R079 Chest pain, unspecified: Secondary | ICD-10-CM | POA: Diagnosis not present

## 2019-07-06 LAB — TROPONIN I (HIGH SENSITIVITY)
Troponin I (High Sensitivity): <2 ng/L (ref <18)
Troponin I (High Sensitivity): <2 ng/L (ref <18)

## 2019-07-06 LAB — BASIC METABOLIC PANEL
Anion gap: 10 (ref 5–15)
BUN: 9 mg/dL (ref 6–20)
CO2: 25 mmol/L (ref 22–32)
Calcium: 10.1 mg/dL (ref 8.9–10.3)
Chloride: 107 mmol/L (ref 98–111)
Creatinine, Ser: 1.05 mg/dL — ABNORMAL HIGH (ref 0.44–1.00)
GFR calc Af Amer: >60 mL/min (ref >60)
GFR calc non Af Amer: >60 mL/min (ref >60)
Glucose, Bld: 108 mg/dL — ABNORMAL HIGH (ref 70–99)
Potassium: 3.8 mmol/L (ref 3.5–5.1)
Sodium: 142 mmol/L (ref 135–145)

## 2019-07-06 LAB — CBC
HCT: 40.9 % (ref 36.0–46.0)
Hemoglobin: 13.2 g/dL (ref 12.0–15.0)
MCH: 30.3 pg (ref 26.0–34.0)
MCHC: 32.3 g/dL (ref 30.0–36.0)
MCV: 93.8 fL (ref 80.0–100.0)
Platelets: 414 10*3/uL — ABNORMAL HIGH (ref 150–400)
RBC: 4.36 MIL/uL (ref 3.87–5.11)
RDW: 13.4 % (ref 11.5–15.5)
WBC: 6 10*3/uL (ref 4.0–10.5)
nRBC: 0 % (ref 0.0–0.2)

## 2019-07-06 LAB — I-STAT BETA HCG BLOOD, ED (MC, WL, AP ONLY): I-stat hCG, quantitative: <5 m[IU]/mL (ref <5)

## 2019-07-06 MED ORDER — SODIUM CHLORIDE 0.9% FLUSH: 3.0000 mL | Freq: Once | INTRAVENOUS | Status: DC

## 2019-07-06 NOTE — ED Triage Notes (Signed)
Patient reports central chest pain radiating to back, intermittent and pressure-like in nature x 2 days. States bending down and certain movements make it worse. Also endorsing shortness of breath and lightheadedness at times. Some relief with Tylenol. Tested positive for COVID back in September (was cleared earlier this month). Denies other symptoms at this time. Resp e/u, skin w/d.

## 2019-07-06 NOTE — ED Notes (Signed)
Patient verbalizes understanding of discharge instructions. Opportunity for questioning and answers were provided. Armband removed by staff, pt discharged from ED.  

## 2019-07-06 NOTE — Telephone Encounter (Signed)
Phone call from pt.  Reported onset 2 days ago of "pressure in center of chest, radiating through to the back, just below the shoulder blades."  Denied radiation of pain into the neck, jaw or left arm.  Stated the pain is at level 5/10.  Reported the pain comes and goes; lasts approx. For 10-15 minute intervals.  C/o mild shortness of breath with activity.  Denied nausea.  Denied fever or cough.  Reported she had COVID in early October, but did not experience these same symptoms.  Significant family hx for heart attack.  Advised she needed to go to ER for evaluation at this time.  Advised that someone should drive her.  Pt. Verb. Understanding.  Agreed with plan.     Reason for Disposition . [1] Chest pain lasts > 5 minutes AND [2] occurred in past 3 days (72 hours)  Answer Assessment - Initial Assessment Questions 1. LOCATION: "Where does it hurt?"       Center of chest, between breasts and center of back, beneath shoulder blades  2. RADIATION: "Does the pain go anywhere else?" (e.g., into neck, jaw, arms, back)     Radiates from front to back  3. ONSET: "When did the chest pain begin?" (Minutes, hours or days)     2 days ago 4. PATTERN "Does the pain come and go, or has it been constant since it started?"  "Does it get worse with exertion?"      Comes and goes 5. DURATION: "How long does it last" (e.g., seconds, minutes, hours)     10-15 minutes 6. SEVERITY: "How bad is the pain?"  (e.g., Scale 1-10; mild, moderate, or severe)    - MILD (1-3): doesn't interfere with normal activities     - MODERATE (4-7): interferes with normal activities or awakens from sleep    - SEVERE (8-10): excruciating pain, unable to do any normal activities       5/10  7. CARDIAC RISK FACTORS: "Do you have any history of heart problems or risk factors for heart disease?" (e.g., angina, prior heart attack; diabetes, high blood pressure, high cholesterol, smoker, or strong family history of heart disease)    Denied DM,  denied HTN, high cholesterol, denied smoking; mother and father died of heart attack 8. PULMONARY RISK FACTORS: "Do you have any history of lung disease?"  (e.g., blood clots in lung, asthma, emphysema, birth control pills)     Denied hx of 9. CAUSE: "What do you think is causing the chest pain?"    Denied  10. OTHER SYMPTOMS: "Do you have any other symptoms?" (e.g., dizziness, nausea, vomiting, sweating, fever, difficulty breathing, cough)      Denied fever, nausea.  Reports she has intermittent sweats since in Menopause.  c/o mild shortness of breath with activity 11. PREGNANCY: "Is there any chance you are pregnant?" "When was your last menstrual period?"      In menopause  Protocols used: CHEST PAIN-A-AH

## 2019-07-06 NOTE — ED Provider Notes (Signed)
[facility] Provider Note   CSN: [id number] Arrival date & time: 07/06/19  [id number]     History   Chief Complaint Chief Complaint  Patient presents with  . Chest Pain  . Shortness of Breath    HPI [facility] is a 53 y.o. female.     HPI   Patient presents today after having sternal chest pain since Saturday (2 days). CP is intermittent, positional - worse when she bends her head, and better with her head up, and transitions to back between her shoulder blades when her head is bent back - and about a 2/10. Pain is only present in her chest or back at one time. It is not a contiguous pain that radiates from her chest to her back. She states she had some relief with tylenol. Lifting items seems to make symptoms worse. Denies worsening symptoms with exertion, previous trauma, fevers, chills, other symptoms. States that she was diagnosed with COVID in September, but she was cleared earlier this month.   Past Medical History:  Diagnosis Date  . ACID REFLUX DISEASE 01/03/2008  . Anemia   . ANEMIA, IRON DEFICIENCY 12/23/2008   history  . Chronic tension type headache 01/13/2009   migraines  . Complication of anesthesia    pt states she just needs a small amount or she will sleep too long  . Dysplasia of cervix, low grade (CIN 1)   . Fibroid   . GERD (gastroesophageal reflux disease)   . HEART MURMUR, SYSTOLIC 123456   will have echo 06/18/2013  . 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 Irritable bowel syndrome 12/23/2008  . Migraine   . Ovarian cyst   . Ovarian cyst   . THYROID NODULE, RIGHT 04/21/2009    Patient Active Problem List   Diagnosis Date Noted  . Rash and nonspecific skin eruption 07/01/2019  . Musculoskeletal strain 03/25/2019  . Visit for screening mammogram 03/04/2019  . Chronic idiopathic constipation 01/08/2019  . Essential hypertension 10/24/2018  . Psychophysiological insomnia 07/07/2018  . Seasonal  allergic rhinitis due to pollen 08/08/2017  . Intractable migraine without aura and without status migrainosus 07/02/2016  . Hyperglycemia 11/28/2015  . Pancreas divisum of native pancreas 12/27/2014  . Routine general medical examination at a health care facility 07/30/2012  . GERD (gastroesophageal reflux disease)   . Hypothyroidism 08/12/2009    Past Surgical History:  Procedure Laterality Date  . ABDOMINAL HYSTERECTOMY    . COLONOSCOPY    . HYSTEROSCOPY    . LAPAROSCOPY    . NOVASURE ABLATION    . POLYPECTOMY    . TUBAL LIGATION    . UPPER GI ENDOSCOPY    . VAGINAL HYSTERECTOMY N/A 06/24/2013   Procedure: TOTAL HYSTERECTOMY VAGINAL;  Surgeon: Alwyn Pea, MD;  Location: [facility];  Service: Gynecology;  Laterality: N/A;     OB History    Gravida  3   Para  3   Term      Preterm      AB      Living  3     SAB      TAB      Ectopic      Multiple      Live Births  3            Home Medications    Prior to Admission medications   Medication Sig Start Date End Date Taking? Authorizing Provider  acetaminophen (TYLENOL) 325 MG tablet Take 650 mg by  mouth every 6 (six) hours as needed for mild pain.    [provider]  co-enzyme Q-10 30 MG capsule Take 200 mg by mouth 3 (three) times daily.    [provider]  cyclobenzaprine (FLEXERIL) 5 MG tablet Take 1 tablet (5 mg total) by mouth 3 (three) times daily as needed for muscle spasms. 04/10/19   Marrian Salvage, FNP  Fremanezumab-vfrm (AJOVY) 225 MG/1.5ML SOAJ Inject 225 mg into the skin every 30 (thirty) days. 01/29/19   Pieter Partridge, DO  ibuprofen (ADVIL,MOTRIN) 200 MG tablet Take 200 mg by mouth every 8 (eight) hours as needed.    [provider]  pantoprazole (PROTONIX) 40 MG tablet Take 40 mg by mouth 2 (two) times daily.    [provider]  Plecanatide (TRULANCE) 3 MG TABS Take 1 tablet by mouth daily. Patient not taking: Reported on 05/05/2019 01/09/19   Janith Lima, MD  SYNTHROID 75 MCG tablet TAKE 1 TABLET BY MOUTH DAILY. 04/14/19   Janith Lima, MD    Family History Family History  Problem Relation Age of Onset  . Goiter Mother   . Heart disease Mother   . Thyroid disease Mother   . Heart attack Mother   . Graves' disease Sister   . Asthma Sister   . Cancer Father   . Heart disease Father   . Heart attack Father   . CVA Brother   . Hypertension Brother   . Hypertension Brother   . Alcoholism Brother   . Stomach cancer Sister   . Hepatitis Sister   . Stomach cancer Sister   . Goiter Other        Siblings 2  . Hypertension Paternal Grandfather   . Depression Neg Hx   . Alcohol abuse Neg Hx   . Drug abuse Neg Hx   . Early death Neg Hx   . Hearing loss Neg Hx   . Kidney disease Neg Hx   . Stroke Neg Hx     Social History Social History   Tobacco Use  . Smoking status: Never Smoker  . Smokeless tobacco: Never Used  Substance Use Topics  . Alcohol use: No    Comment: occ  . Drug use: No     Allergies   Imitrex [sumatriptan], Propoxyphene n-acetaminophen, Nabumetone, and Topiramate   Review of Systems Review of Systems  Constitutional: Negative for chills and fever.  Eyes: Negative for visual disturbance.  Respiratory: Negative for cough and shortness of breath.   Cardiovascular: Positive for chest pain.  Gastrointestinal: Negative for abdominal pain, nausea and vomiting.  Musculoskeletal: Positive for back pain.  Skin: Negative for rash and wound.  Neurological: Negative for syncope.  All other systems reviewed and are negative.    Physical Exam Updated Vital Signs BP 124/76   Pulse 83   Temp 98.7 F (37.1 C) (Oral)   Resp 16   Ht 5' (1.524 m)   Wt 61.7 kg   LMP 04/20/2013   SpO2 100%   BMI 26.56 kg/m   Physical Exam Vitals signs and nursing note reviewed.  Constitutional:      Appearance: She is well-developed. She is not toxic-appearing.  HENT:     Head: Normocephalic and atraumatic.   Eyes:     Conjunctiva/sclera: Conjunctivae normal.  Neck:     Comments: No cervical midline tenderness to palpation. FROM. Supple. Cardiovascular:     Rate and Rhythm: Normal rate and regular rhythm.  Heart sounds: No murmur.     Comments: HR 85 during my exam Pulmonary:     Effort: Pulmonary effort is normal. No tachypnea, accessory muscle usage or respiratory distress.     Breath sounds: Normal breath sounds.  Chest:     Chest wall: Tenderness present.     Comments: Chest wall TTP over the sternum when patient brings her head forward to her chin. Back not tender at that time. Back (between shoulder blades) tender when patient brings her neck back. Chest not tender at that time. Abdominal:     Palpations: Abdomen is soft.     Tenderness: There is no abdominal tenderness.  Skin:    General: Skin is warm and dry.     Capillary Refill: Capillary refill takes less than 2 seconds.  Neurological:     General: No focal deficit present.     Mental Status: She is alert and oriented to person, place, and time.     Cranial Nerves: No cranial nerve deficit.     Motor: No weakness.      ED Treatments / Results  Labs (all labs ordered are listed, but only abnormal results are displayed) Labs Reviewed  BASIC METABOLIC PANEL - Abnormal; Notable for the following components:      Result Value   Glucose, Bld 108 (*)    Creatinine, Ser 1.05 (*)    All other components within normal limits  CBC - Abnormal; Notable for the following components:   Platelets 414 (*)    All other components within normal limits  I-STAT BETA HCG BLOOD, ED ([facility], [facility], [facility] ONLY)  TROPONIN I (HIGH SENSITIVITY)  TROPONIN I (HIGH SENSITIVITY)    EKG None  Radiology Dg Chest 2 View  Result Date: 07/06/2019 CLINICAL DATA:  Chest pain, shortness of breath EXAM: CHEST - 2 VIEW COMPARISON:  06/26/2018 FINDINGS: Heart and mediastinal contours are within normal limits. No focal opacities or effusions. No acute bony  abnormality. IMPRESSION: No active cardiopulmonary disease. Electronically Signed   By: Rolm Baptise M.D.   On: 07/06/2019 10:48    Procedures Procedures (including critical care time)  Medications Ordered in ED Medications  sodium chloride flush (NS) 0.9 % injection 3 mL (has no administration in time range)     Initial Impression / Assessment and Plan / ED Course  I have reviewed the triage vital signs and the nursing notes.  Pertinent labs & imaging results that were available during my care of the patient were reviewed by me and considered in my medical decision making (see chart for details).        [facility] is a 53 y.o. female presents today for likely musculoskeletal pain. Doubt vertebral artery dissection considering no trauma, intermittent pain, and pain with palpation. Doubt dissection as no contiguous chest pain that radiates to the back. Doubt ACS as triage labs showed normal troponin. HEAR score 1. Normal chest x ray in triage, so doubt pneumothorax or pneumonia. Triage labs showed no significant leukocytosis, anemia, electrolyte abnormality.  Recommend tylenol or motrin for pain. Patient has a prescription filled for flexeril at home. All questions answered. Supportive care recommended. Care of patient was discussed with the supervising attending.  Final Clinical Impressions(s) / ED Diagnoses   Final diagnoses:  None    ED Discharge Orders    None       Julianne Rice, MD 07/07/19 0009    Lajean Saver, MD 07/09/19 1444

## 2019-07-06 NOTE — Discharge Instructions (Signed)
Please use tylenol or motrin for pain. Please make sure to follow up with your PCP within a week to make sure you're feeling better.

## 2019-07-20 ENCOUNTER — Ambulatory Visit: Payer: 59 | Admitting: Family

## 2019-07-20 MED FILL — PANTOPRAZOLE SOD DR 40 MG T: 40 | 90 days supply | Qty: 180 | Fill #0

## 2019-07-20 MED FILL — AJOVY 225 MG/1.5ML SOAJ: 225 | 30 days supply | Qty: 2 | Fill #2

## 2019-07-21 ENCOUNTER — Encounter: Payer: Self-pay | Admitting: Family

## 2019-07-21 ENCOUNTER — Other Ambulatory Visit: Payer: Self-pay

## 2019-07-21 ENCOUNTER — Ambulatory Visit (INDEPENDENT_AMBULATORY_CARE_PROVIDER_SITE_OTHER): Payer: 59 | Admitting: Family

## 2019-07-21 VITALS — BP 116/78 | HR 80 | Temp 98.5°F | Ht 60.0 in | Wt 134.8 lb

## 2019-07-21 DIAGNOSIS — R0789 Other chest pain: Secondary | ICD-10-CM | POA: Diagnosis not present

## 2019-07-21 NOTE — Progress Notes (Signed)
[facility] is a 53 y.o. female with the following history as recorded in EpicCare:  Patient Active Problem List   Diagnosis Date Noted  . Rash and nonspecific skin eruption 07/01/2019  . Musculoskeletal strain 03/25/2019  . Visit for screening mammogram 03/04/2019  . Chronic idiopathic constipation 01/08/2019  . Essential hypertension 10/24/2018  . Psychophysiological insomnia 07/07/2018  . Seasonal allergic rhinitis due to pollen 08/08/2017  . Intractable migraine without aura and without status migrainosus 07/02/2016  . Hyperglycemia 11/28/2015  . Pancreas divisum of native pancreas 12/27/2014  . Routine general medical examination at a health care facility 07/30/2012  . GERD (gastroesophageal reflux disease)   . Hypothyroidism 08/12/2009    Current Outpatient Medications  Medication Sig Dispense Refill  . acetaminophen (TYLENOL) 325 MG tablet Take 650 mg by mouth every 6 (six) hours as needed for mild pain.    Marland Kitchen co-enzyme Q-10 30 MG capsule Take 200 mg by mouth 3 (three) times daily.    . cyclobenzaprine (FLEXERIL) 5 MG tablet Take 1 tablet (5 mg total) by mouth 3 (three) times daily as needed for muscle spasms. 30 tablet 1  . Fremanezumab-vfrm (AJOVY) 225 MG/1.5ML SOAJ Inject 225 mg into the skin every 30 (thirty) days. 1 pen 11  . ibuprofen (ADVIL,MOTRIN) 200 MG tablet Take 200 mg by mouth every 8 (eight) hours as needed.    . pantoprazole (PROTONIX) 40 MG tablet Take 40 mg by mouth 2 (two) times daily.    Marland Kitchen SYNTHROID 75 MCG tablet TAKE 1 TABLET BY MOUTH DAILY. 90 tablet 0  . Plecanatide (TRULANCE) 3 MG TABS Take 1 tablet by mouth daily. (Patient not taking: Reported on 05/05/2019) 90 tablet 1   No current facility-administered medications for this visit.     Allergies: Imitrex [sumatriptan], Propoxyphene n-acetaminophen, Nabumetone, and Topiramate  Past Medical History:  Diagnosis Date  . ACID REFLUX DISEASE 01/03/2008  . Anemia   . ANEMIA, IRON DEFICIENCY 12/23/2008    history  . Chronic tension type headache 01/13/2009   migraines  . Complication of anesthesia    pt states she just needs a small amount or she will sleep too long  . Dysplasia of cervix, low grade (CIN 1)   . Fibroid   . GERD (gastroesophageal reflux disease)   . HEART MURMUR, SYSTOLIC 123456   will have echo 06/18/2013  . 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 Irritable bowel syndrome 12/23/2008  . Migraine   . Ovarian cyst   . Ovarian cyst   . THYROID NODULE, RIGHT 04/21/2009    Past Surgical History:  Procedure Laterality Date  . ABDOMINAL HYSTERECTOMY    . COLONOSCOPY    . HYSTEROSCOPY    . LAPAROSCOPY    . NOVASURE ABLATION    . POLYPECTOMY    . TUBAL LIGATION    . UPPER GI ENDOSCOPY    . VAGINAL HYSTERECTOMY N/A 06/24/2013   Procedure: TOTAL HYSTERECTOMY VAGINAL;  Surgeon: Alwyn Pea, MD;  Location: [facility];  Service: Gynecology;  Laterality: N/A;    Family History  Problem Relation Age of Onset  . Goiter Mother   . Heart disease Mother   . Thyroid disease Mother   . Heart attack Mother   . Graves' disease Sister   . Asthma Sister   . Cancer Father   . Heart disease Father   . Heart attack Father   . CVA Brother   . Hypertension Brother   . Hypertension Brother   . Alcoholism Brother   .  Stomach cancer Sister   . Hepatitis Sister   . Stomach cancer Sister   . Goiter Other        Siblings 2  . Hypertension Paternal Grandfather   . Depression Neg Hx   . Alcohol abuse Neg Hx   . Drug abuse Neg Hx   . Early death Neg Hx   . Hunt loss Neg Hx   . Kidney disease Neg Hx   . Stroke Neg Hx     Social History   Tobacco Use  . Smoking status: Never Smoker  . Smokeless tobacco: Never Used  Substance Use Topics  . Alcohol use: No    Comment: occ    Subjective:  Patient was seen at the ER on 07/06/2019 with suspected muscular chest pain; notes that symptoms resolved within 2-3 days after being seen in the ER; no chest pain or shortness of  breath on exertion; Had been diagnosed with COVID almost 3 weeks prior and went to the ER to make sure she was not experiencing complications.   Objective:  Vitals:   07/21/19 1013  BP: 116/78  Pulse: 80  Temp: 98.5 F (36.9 C)  TempSrc: Oral  SpO2: 99%  Weight: 134 lb 12.8 oz (61.1 kg)  Height: 5' (1.524 m)    General: Well developed, well nourished, in no acute distress  Skin : Warm and dry.  Head: Normocephalic and atraumatic  Lungs: Respirations unlabored; clear to auscultation bilaterally without wheeze, rales, rhonchi  CVS exam: normal rate and regular rhythm.  Neurologic: Alert and oriented; speech intact; face symmetrical; moves all extremities well; CNII-XII intact without focal deficit   Assessment:  1. Atypical chest pain     Plan:  Felt to be muscular in nature; patient is completely asymptomatic and work up at ER was unremarkable; do not feel further work- up warranted at this time; Patient will plan to follow-up in June 2020 for TSH re-check; she will continue with her GYN and neurologist as well.   No follow-ups on file.  No orders of the defined types were placed in this encounter.   Requested Prescriptions    No prescriptions requested or ordered in this encounter

## 2019-07-27 ENCOUNTER — Other Ambulatory Visit: Payer: Self-pay | Admitting: Family

## 2019-07-27 ENCOUNTER — Other Ambulatory Visit: Payer: Self-pay | Admitting: Internal Medicine

## 2019-07-27 DIAGNOSIS — E039 Hypothyroidism, unspecified: Secondary | ICD-10-CM

## 2019-07-27 NOTE — Telephone Encounter (Signed)
Copied from [place] [id number]. Topic: Quick Communication - Rx Refill/Question >> Jul 27, 2019  2:56 PM Rainey Pines A wrote: Medication: SYNTHROID 75 MCG tablet (Patient is requesting medication be called in.) (Agent: If no, request that the patient contact the pharmacy for the refill.) (Agent: If yes, when and what did the pharmacy advise?)Contact PCP  Preferred Pharmacy (with phone number or street name): [facility], [place]. [phone] (Phone) [phone] (Fax)    Agent: Please be advised that RX refills may take up to 3 business days. We ask that you follow-up with your pharmacy.

## 2019-07-27 NOTE — Telephone Encounter (Signed)
Requested medication (s) are due for refill today: yes  Requested medication (s) are on the active medication list: yes  Last refill:  04/14/2019  Future visit scheduled: no  Notes to clinic: last filled by different provider  Review for refill   Requested Prescriptions  Pending Prescriptions Disp Refills   SYNTHROID 75 MCG tablet 90 tablet 0    Sig: Take 1 tablet (75 mcg total) by mouth daily.     Endocrinology:  Hypothyroid Agents Failed - 07/27/2019  3:09 PM      Failed - TSH needs to be rechecked within 3 months after an abnormal result. Refill until TSH is due.      Passed - TSH in normal range and within 360 days    TSH  Date Value Ref Range Status  03/04/2019 0.36 0.35 - 4.50 uIU/mL Final         Passed - Valid encounter within last 12 months    Recent Outpatient Visits          6 days ago Atypical chest pain   [place], Marvis Repress, FNP   3 weeks ago Rash and nonspecific skin eruption   [place], MD   3 months ago Left shoulder pain, unspecified chronicity   [place], Marvis Repress, FNP   4 months ago Musculoskeletal strain   [facility] -Mayer Camel, MD   4 months ago Essential hypertension   [place], Thomas L, MD      Future Appointments            In 1 month Pieter Partridge, DO [facility]

## 2019-07-28 MED ORDER — SYNTHROID 75 MCG PO TABS: 75.0000 ug | ORAL_TABLET | Freq: Every day | ORAL | 0 refills | Status: DC

## 2019-07-28 MED FILL — SYNTHROID 75 MCG TABLET: 75 | 90 days supply | Qty: 90 | Fill #0

## 2019-07-30 ENCOUNTER — Ambulatory Visit (INDEPENDENT_AMBULATORY_CARE_PROVIDER_SITE_OTHER): Payer: 59 | Admitting: Internal Medicine

## 2019-07-30 ENCOUNTER — Encounter: Payer: Self-pay | Admitting: Internal Medicine

## 2019-07-30 ENCOUNTER — Other Ambulatory Visit: Payer: Self-pay

## 2019-07-30 VITALS — BP 130/84 | HR 84 | Temp 98.1°F | Ht 60.0 in | Wt 131.0 lb

## 2019-07-30 DIAGNOSIS — F419 Anxiety disorder, unspecified: Secondary | ICD-10-CM | POA: Diagnosis not present

## 2019-07-30 DIAGNOSIS — F5104 Psychophysiologic insomnia: Secondary | ICD-10-CM | POA: Diagnosis not present

## 2019-07-30 DIAGNOSIS — R739 Hyperglycemia, unspecified: Secondary | ICD-10-CM

## 2019-07-30 MED ORDER — GABAPENTIN 300 MG PO CAPS: 300.0000 mg | ORAL_CAPSULE | Freq: Every day | ORAL | 1 refills | Status: DC

## 2019-07-30 MED ORDER — CITALOPRAM HYDROBROMIDE 10 MG PO TABS: 10.0000 mg | ORAL_TABLET | Freq: Every day | ORAL | 3 refills | Status: DC

## 2019-07-30 MED FILL — GABAPENTIN 300 MG CAPSULE: 300 | 90 days supply | Qty: 90 | Fill #0

## 2019-07-30 MED FILL — CITALOPRAM HBR 10 MG TABLET: 10 | 90 days supply | Qty: 90 | Fill #0

## 2019-07-30 NOTE — Progress Notes (Signed)
Subjective:    Patient ID: [facility], female    DOB: 1965/12/23, 53 y.o.   MRN: [id number]  HPI  Here with c/o 2 days HA near the crown, has hx of migraine but this is different with "active nerve ending" acting up at the crown, twitching intermittent about the right eye, and jerking of the arms to trying to go to sleep last night.  Denies worsening depressive symptoms, suicidal ideation, or panic; has ongoing anxiety,  increased recently with social stressors.  Had MRI head neg for acute in mar 2020.  Pt denies chest pain, increased sob or doe, wheezing, orthopnea, PND, increased LE swelling, palpitations, dizziness or syncope.  Pt denies new neurological symptoms such as new headache, or facial or extremity weakness or numbness  Pt denies polydipsia, polyuria Past Medical History:  Diagnosis Date   ACID REFLUX DISEASE 01/03/2008   Anemia    ANEMIA, IRON DEFICIENCY 12/23/2008   history   Chronic tension type headache 01/13/2009   migraines   Complication of anesthesia    pt states she just needs a small amount or she will sleep too long   Dysplasia of cervix, low grade (CIN 1)    Fibroid    GERD (gastroesophageal reflux disease)    HEART MURMUR, SYSTOLIC 123456   will have echo 06/18/2013   HYPOTHYROIDISM, POST-RADIATION 08/12/2009   Irritable bowel syndrome 12/23/2008   Migraine    Ovarian cyst    Ovarian cyst    THYROID NODULE, RIGHT 04/21/2009   Past Surgical History:  Procedure Laterality Date   ABDOMINAL HYSTERECTOMY     COLONOSCOPY     HYSTEROSCOPY     LAPAROSCOPY     NOVASURE ABLATION     POLYPECTOMY     TUBAL LIGATION     UPPER GI ENDOSCOPY     VAGINAL HYSTERECTOMY N/A 06/24/2013   Procedure: TOTAL HYSTERECTOMY VAGINAL;  Surgeon: Alwyn Pea, MD;  Location: [facility];  Service: Gynecology;  Laterality: N/A;    reports that she has never smoked. She has never used smokeless tobacco. She reports that she does not drink alcohol or use  drugs. family history includes Alcoholism in her brother; Asthma in her sister; CVA in her brother; Cancer in her father; Goiter in her mother and another family member; Berenice Primas' disease in her sister; Heart attack in her father and mother; Heart disease in her father and mother; Hepatitis in her sister; Hypertension in her brother, brother, and paternal grandfather; Stomach cancer in her sister and sister; Thyroid disease in her mother. Allergies  Allergen Reactions   Imitrex [Sumatriptan] Palpitations   Propoxyphene N-Acetaminophen    Nabumetone Rash    REACTION: GI upset   Topiramate Rash    REACTION: rash   Current Outpatient Medications on File Prior to Visit  Medication Sig Dispense Refill   acetaminophen (TYLENOL) 325 MG tablet Take 650 mg by mouth every 6 (six) hours as needed for mild pain.     co-enzyme Q-10 30 MG capsule Take 200 mg by mouth 3 (three) times daily.     cyclobenzaprine (FLEXERIL) 5 MG tablet Take 1 tablet (5 mg total) by mouth 3 (three) times daily as needed for muscle spasms. 30 tablet 1   Fremanezumab-vfrm (AJOVY) 225 MG/1.5ML SOAJ Inject 225 mg into the skin every 30 (thirty) days. 1 pen 11   ibuprofen (ADVIL,MOTRIN) 200 MG tablet Take 200 mg by mouth every 8 (eight) hours as needed.     pantoprazole (PROTONIX) 40 MG  tablet Take 40 mg by mouth 2 (two) times daily.     Plecanatide (TRULANCE) 3 MG TABS Take 1 tablet by mouth daily. 90 tablet 1   Rimegepant Sulfate (NURTEC) 75 MG TBDP Take by mouth as needed.     SYNTHROID 75 MCG tablet Take 1 tablet (75 mcg total) by mouth daily. 90 tablet 0   No current facility-administered medications on file prior to visit.    Review of Systems  Constitutional: Negative for other unusual diaphoresis or sweats HENT: Negative for ear discharge or swelling Eyes: Negative for other worsening visual disturbances Respiratory: Negative for stridor or other swelling  Gastrointestinal: Negative for worsening distension  or other blood Genitourinary: Negative for retention or other urinary change Musculoskeletal: Negative for other MSK pain or swelling Skin: Negative for color change or other new lesions Neurological: Negative for worsening tremors and other numbness  Psychiatric/Behavioral: Negative for worsening agitation or other fatigue All otherwise neg per pt     Objective:   Physical Exam BP 130/84 (BP Location: Left Arm, Patient Position: Sitting, Cuff Size: Normal)    Pulse 84    Temp 98.1 F (36.7 C) (Oral)    Ht 5' (1.524 m)    Wt 131 lb (59.4 kg)    LMP 04/20/2013    SpO2 99%    BMI 25.58 kg/m  VS noted,  Constitutional: Pt appears in NAD HENT: Head: NCAT.  Right Ear: External ear normal.  Left Ear: External ear normal.  Eyes: . Pupils are equal, round, and reactive to light. Conjunctivae and EOM are normal Nose: without d/c or deformity Neck: Neck supple. Gross normal ROM Cardiovascular: Normal rate and regular rhythm.   Pulmonary/Chest: Effort normal and breath sounds without rales or wheezing.  Abd:  Soft, NT, ND, + BS, no organomegaly Neurological: Pt is alert. At baseline orientation, motor grossly intact Skin: Skin is warm. No rashes, other new lesions, no LE edema Psychiatric: Pt behavior is normal without agitation but 2+ nervous All otherwise neg per pt Lab Results  Component Value Date   WBC 6.0 07/06/2019   HGB 13.2 07/06/2019   HCT 40.9 07/06/2019   PLT 414 (H) 07/06/2019   GLUCOSE 108 (H) 07/06/2019   CHOL 215 (H) 03/04/2019   TRIG 69.0 03/04/2019   HDL 65.60 03/04/2019   LDLDIRECT 133.5 10/23/2013   LDLCALC 135 (H) 03/04/2019   ALT 29 01/08/2019   AST 22 01/08/2019   NA 142 07/06/2019   K 3.8 07/06/2019   CL 107 07/06/2019   CREATININE 1.05 (H) 07/06/2019   BUN 9 07/06/2019   CO2 25 07/06/2019   TSH 0.36 03/04/2019   HGBA1C 5.9 07/09/2018        Assessment & Plan:

## 2019-07-30 NOTE — Progress Notes (Signed)
Nurtec 75mg prior authorization submitted via covermymeds.  Waiting response.  

## 2019-07-30 NOTE — Patient Instructions (Signed)
Please take all new medication as prescribed - the celexa 10 mg per day for nerves  Please take all new medication as prescribed - the gabapentin 300 mg at night  Please continue all other medications as before, and refills have been done if requested.  Please have the pharmacy call with any other refills you may need.  Please keep your appointments with your specialists as you may have planned

## 2019-07-30 NOTE — Assessment & Plan Note (Addendum)
For gabapentin 300 qhs, may help current HA and jerks as well

## 2019-07-30 NOTE — Assessment & Plan Note (Signed)
Mild to mod, for celexa 10 qd,  to f/u any worsening symptoms or concerns 

## 2019-07-30 NOTE — Assessment & Plan Note (Signed)
stable overall by history and exam, recent data reviewed with pt, and pt to continue medical treatment as before,  to f/u any worsening symptoms or concerns  

## 2019-08-03 NOTE — Progress Notes (Signed)
Hard fax from [place]  REF# [id number] PLAN [id number]: [id number]  The authorization is effective for a maximum of 12 fills from 08/12/19 to 08/10/2020 As long as patient is enrolled as a member of your current health plan.  This request is approved for 16 tablets per 30 days.  We were ask to cover a larger amount of the drug listed at the top of this letter than allowed under our [place] rule. Your doctor requested a qty of 20 tablets for 30 days, but for the treatment of migraines headaches, our guideline names RIMEGEPANT (NURTEC ODT) allows for approval of a maximum of 16 tablets for 30 day supply. The safety of treating more then 8 migraines in a 30 day period has not been established

## 2019-08-03 NOTE — Progress Notes (Signed)
Virtual Visit via Video Note The purpose of this virtual visit is to provide medical care while limiting exposure to the novel coronavirus.    Consent was obtained for video visit:  Yes.   Answered questions that patient had about telehealth interaction:  Yes.   I discussed the limitations, risks, security and privacy concerns of performing an evaluation and management service by telemedicine. I also discussed with the patient that there may be a patient responsible charge related to this service. The patient expressed understanding and agreed to proceed.  Pt location: Home Physician Location: office Name of referring provider:  Marrian Salvage,* I connected with [name] at patients initiation/request on 08/05/2019 at  8:30 AM EST by video enabled telemedicine application and verified that I am speaking with the correct person using two identifiers. Pt MRN:  [id number] Pt DOB:  09-10-1966 Video Participants:  [name]   History of Present Illness:  Chelsey Hunt is a 53 year old right-handed woman with hypothyroidism who follows up for migraines.  UPDATE: She had COVID-19 in late September, which aggravated her headaches.   The burning tingling headache in back and top of head with tinnitus occurs every couple of days, lasting all day. Typical migraines are moderate-severe, lasting 1 hour with Nurtec and occurring 3 to 4 over past month  Current NSAIDS: Advil Current analgesics: Tylenol Current triptans:None Current ergotamine: None Current abortive anti-CGRP:Nurtec Current anti-emetic: Promethazine Current muscle relaxants: Cyclobenzaprine 10 mg Current anti-anxiolytic: None Current sleep aide: None Current Antihypertensive medications: None Current Antidepressant medications: None Current Anticonvulsant medications: None Current anti-CGRP:Ajovy, Nurtec  Current Vitamins/Herbal/Supplements:Co Q-10 Current Antihistamines/Decongestants:  None Other therapy: None Hormone/birth control: None  Caffeine: No Diet: 5-6 bottles of water daily Exercise: Not routine Depression: No; Anxiety: Yes Other pain:neck pain.  She has cervical spondylosis Sleep hygiene: Poor  HISTORY: Onset:First migraine was between 86 and 55 years old. Migraine free until 2004 during her menstrual cycle. Chronic for many years. Location:Left more than right. Quality:throbbing Initial intensity:Severe.  Aura:no Prodrome:no Postdrome:fatigue Associated symptoms: Right facial tingling, nausea, photophobia, phonophobia, osmophobia.Shedenies associated vomiting, visual disturbance orunilateral numbness or weakness. Initial duration:3 days InitialFrequency:daily InitialFrequency of abortive medication:Advil or Tylenol daily, Maxalt 2 days a week Triggers:  Certain smells, sausages, bananas Relieving factors: Exercise Activity:aggravates  During summer 2019, she has had throbbing occipital headache, a burning and vibrating sensation, associated with tinnitus, sometimes left or right sided facial numbness, facial twitching. She started having a panic attack with palpitations. She went to the ED on 06/26/18 for symptoms. EKG showed sinus tachycardia. She was treated with headache cocktail  Past NSAIDS:Ibuprofen, naproxen, Cambia Past analgesics:Excedrin, Lidocaine nasal Past abortive triptans:Relpax, sumatriptantablet, Maxalt, Tosymra NS, Zembrace. Past abortive ergotamine:Migranal Past muscle relaxants:none Past anti-emetic:Zofran Past antihypertensive medications:Verapamil, propranolol Past antidepressant medications:Imipramine, nortriptyline Past anticonvulsant medications:topiramate Past anti-CGRP:Aimovig (aggravated migraine), Emgality (ineffective), Ubrelvy (ineffective) Past vitamins/Herbal/Supplements:Riboflavin, magnesium Past antihistamines/decongestants:None Other past  therapies:acupuncture, Reyvow  Family history of headache:Mom (migraines), daughter (migraines)  MRI and MRA of the head from 03/09/2009 revealed nonspecific small subcortical white matter hyperintensities and 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  To evaluate worsening headaches, shehad an MRI of the brain on 11/21/2018 which demonstrated nonspecific white matter hyperintensities in the cerebral white matter, likely chronic small vessel ischemic changes, but no acute abnormality.  Past Medical History: Past Medical History:  Diagnosis Date  . ACID REFLUX DISEASE 01/03/2008  . Anemia   . ANEMIA, IRON DEFICIENCY 12/23/2008   history  .  Chronic tension type headache 01/13/2009   migraines  . Complication of anesthesia    pt states she just needs a small amount or she will sleep too long  . Dysplasia of cervix, low grade (CIN 1)   . Fibroid   . GERD (gastroesophageal reflux disease)   . HEART MURMUR, SYSTOLIC 123456   will have echo 06/18/2013  . 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 Irritable bowel syndrome 12/23/2008  . Migraine   . Ovarian cyst   . Ovarian cyst   . THYROID NODULE, RIGHT 04/21/2009    Medications: Outpatient Encounter Medications as of 08/05/2019  Medication Sig  . acetaminophen (TYLENOL) 325 MG tablet Take 650 mg by mouth every 6 (six) hours as needed for mild pain.  . citalopram (CELEXA) 10 MG tablet Take 1 tablet (10 mg total) by mouth daily.  Marland Kitchen co-enzyme Q-10 30 MG capsule Take 200 mg by mouth 3 (three) times daily.  . cyclobenzaprine (FLEXERIL) 5 MG tablet Take 1 tablet (5 mg total) by mouth 3 (three) times daily as needed for muscle spasms.  . Fremanezumab-vfrm (AJOVY) 225 MG/1.5ML SOAJ Inject 225 mg into the skin every 30 (thirty) days.  Marland Kitchen gabapentin (NEURONTIN) 300 MG capsule Take 1 capsule (300 mg total) by mouth at bedtime.  Marland Kitchen ibuprofen (ADVIL,MOTRIN) 200 MG tablet Take 200 mg by mouth every 8  (eight) hours as needed.  . pantoprazole (PROTONIX) 40 MG tablet Take 40 mg by mouth 2 (two) times daily.  Marland Kitchen Plecanatide (TRULANCE) 3 MG TABS Take 1 tablet by mouth daily.  . Rimegepant Sulfate (NURTEC) 75 MG TBDP Take by mouth as needed.  Marland Kitchen SYNTHROID 75 MCG tablet Take 1 tablet (75 mcg total) by mouth daily.   No facility-administered encounter medications on file as of 08/05/2019.     Allergies: Allergies  Allergen Reactions  . Imitrex [Sumatriptan] Palpitations  . Propoxyphene N-Acetaminophen   . Nabumetone Rash    REACTION: GI upset  . Topiramate Rash    REACTION: rash    Family History: Family History  Problem Relation Age of Onset  . Goiter Mother   . Heart disease Mother   . Thyroid disease Mother   . Heart attack Mother   . Graves' disease Sister   . Asthma Sister   . Cancer Father   . Heart disease Father   . Heart attack Father   . CVA Brother   . Hypertension Brother   . Hypertension Brother   . Alcoholism Brother   . Stomach cancer Sister   . Hepatitis Sister   . Stomach cancer Sister   . Goiter Other        Siblings 2  . Hypertension Paternal Grandfather   . Depression Neg Hx   . Alcohol abuse Neg Hx   . Drug abuse Neg Hx   . Early death Neg Hx   . Hearing loss Neg Hx   . Kidney disease Neg Hx   . Stroke Neg Hx     Social History: Social History   Socioeconomic History  . Marital status: Married    Spouse name: Shanon Brow  . Number of children: 3  . Years of education: Not on file  . Highest education level: Associate degree: occupational, Hotel manager, or vocational program  Occupational History  . Occupation: WOMENS    Employer: [employer]  . Financial resource strain: Not on file  . Food insecurity    Worry: Not on file    Inability: Not on file  .  Transportation needs    Medical: Not on file    Non-medical: Not on file  Tobacco Use  . Smoking status: Never Smoker  . Smokeless tobacco: Never Used  Substance and Sexual  Activity  . Alcohol use: No    Comment: occ  . Drug use: No  . Sexual activity: Yes    Birth control/protection: Surgical    Comment: BTL  Lifestyle  . Physical activity    Days per week: Not on file    Minutes per session: Not on file  . Stress: Not on file  Relationships  . Social Herbalist on phone: Not on file    Gets together: Not on file    Attends religious service: Not on file    Active member of club or organization: Not on file    Attends meetings of clubs or organizations: Not on file    Relationship status: Not on file  . Intimate partner violence    Fear of current or ex partner: Not on file    Emotionally abused: Not on file    Physically abused: Not on file    Forced sexual activity: Not on file  Other Topics Concern  . Not on file  Social History Narrative   Regular exercise-yes      Patient is right-handed.She lives with her husband in a 1 story home. She has recently been avoiding caffeine. Walks.    Observations/Objective:   Height 5' (1.524 m), weight 131 lb (59.4 kg), last menstrual period 04/20/2013. No acute distress.  Alert and oriented.  Speech fluent and not dysarthric.  Language intact.  Eyes orthophoric on primary gaze.  Face symmetric.  Assessment and Plan:   1.  Migraine without aura, without status migrainosus, not intractable 2.  Possible occipital neuralgia  1. For burning posterior pain, start gabapentin 300mg  at bedtime  2. For preventative management, Ajovy 3.  For abortive therapy, Nurtec 4.  Limit use of pain relievers to no more than 2 days out of week to prevent risk of rebound or medication-overuse headache. 5.  Keep headache diary 6.  Exercise, hydration, caffeine cessation, sleep hygiene, monitor for and avoid triggers 7.  Consider:  magnesium citrate 400mg  daily, riboflavin 400mg  daily, and coenzyme Q10 100mg  three times daily 8. Follow up 4 months.   Follow Up Instructions:    -I discussed the assessment and  treatment plan with the patient. The patient was provided an opportunity to ask questions and all were answered. The patient agreed with the plan and demonstrated an understanding of the instructions.   The patient was advised to call back or seek an in-person evaluation if the symptoms worsen or if the condition fails to improve as anticipated.  Dudley Major, DO

## 2019-08-05 ENCOUNTER — Other Ambulatory Visit: Payer: Self-pay

## 2019-08-05 ENCOUNTER — Telehealth (INDEPENDENT_AMBULATORY_CARE_PROVIDER_SITE_OTHER): Payer: 59 | Admitting: Neurology

## 2019-08-05 ENCOUNTER — Encounter: Payer: Self-pay | Admitting: Neurology

## 2019-08-05 VITALS — Ht 60.0 in | Wt 131.0 lb

## 2019-08-05 DIAGNOSIS — G43009 Migraine without aura, not intractable, without status migrainosus: Secondary | ICD-10-CM | POA: Diagnosis not present

## 2019-08-05 DIAGNOSIS — M5481 Occipital neuralgia: Secondary | ICD-10-CM

## 2019-08-14 MED FILL — AJOVY 225 MG/1.5ML SOAJ: 225 | 30 days supply | Qty: 2 | Fill #3

## 2019-08-27 MED FILL — NYSTATIN-TRIAMCINOLONE OINT: 100000-0.1 | 10 days supply | Qty: 30 | Fill #1

## 2019-09-07 ENCOUNTER — Telehealth: Payer: 59 | Admitting: Neurology

## 2019-09-17 ENCOUNTER — Encounter: Payer: Self-pay | Admitting: Internal Medicine

## 2019-09-17 ENCOUNTER — Other Ambulatory Visit: Payer: Self-pay

## 2019-09-17 ENCOUNTER — Ambulatory Visit: Payer: Self-pay

## 2019-09-17 ENCOUNTER — Ambulatory Visit (INDEPENDENT_AMBULATORY_CARE_PROVIDER_SITE_OTHER): Payer: 59 | Admitting: Internal Medicine

## 2019-09-17 VITALS — BP 108/64 | HR 81 | Temp 98.1°F | Resp 12 | Ht 60.0 in | Wt 131.0 lb

## 2019-09-17 DIAGNOSIS — I10 Essential (primary) hypertension: Secondary | ICD-10-CM | POA: Diagnosis not present

## 2019-09-17 DIAGNOSIS — R252 Cramp and spasm: Secondary | ICD-10-CM | POA: Insufficient documentation

## 2019-09-17 DIAGNOSIS — R739 Hyperglycemia, unspecified: Secondary | ICD-10-CM

## 2019-09-17 DIAGNOSIS — E039 Hypothyroidism, unspecified: Secondary | ICD-10-CM | POA: Diagnosis not present

## 2019-09-17 DIAGNOSIS — I7300 Raynaud's syndrome without gangrene: Secondary | ICD-10-CM | POA: Diagnosis not present

## 2019-09-17 DIAGNOSIS — G5602 Carpal tunnel syndrome, left upper limb: Secondary | ICD-10-CM | POA: Insufficient documentation

## 2019-09-17 LAB — BASIC METABOLIC PANEL
BUN: 14 mg/dL (ref 6–23)
CO2: 30 mEq/L (ref 19–32)
Calcium: 10.2 mg/dL (ref 8.4–10.5)
Chloride: 102 mEq/L (ref 96–112)
Creatinine, Ser: 0.95 mg/dL (ref 0.40–1.20)
GFR: 74.09 mL/min (ref >60.00)
Glucose, Bld: 84 mg/dL (ref 70–99)
Potassium: 4 mEq/L (ref 3.5–5.1)
Sodium: 138 mEq/L (ref 135–145)

## 2019-09-17 LAB — VITAMIN B12: Vitamin B-12: 404 pg/mL (ref 211–911)

## 2019-09-17 LAB — HEMOGLOBIN A1C: Hgb A1c MFr Bld: 5.8 % (ref 4.6–6.5)

## 2019-09-17 LAB — MAGNESIUM: Magnesium: 2 mg/dL (ref 1.5–2.5)

## 2019-09-17 MED FILL — AJOVY 225 MG/1.5ML SOAJ: 225 | 30 days supply | Qty: 2 | Fill #4

## 2019-09-17 MED FILL — NURTEC 75 MG TBDP: 75 | 30 days supply | Qty: 8 | Fill #0

## 2019-09-17 NOTE — Telephone Encounter (Signed)
Pt. Reports 1 week ago started noticing numbness and tingling in her fingers. No neck or wrist pain. States it is worse in the morning when she gets up. Warm transfer to [facility] in the practice.  Answer Assessment - Initial Assessment Questions 1. SYMPTOM: "What is the main symptom you are concerned about?" (e.g., weakness, numbness)     Tingling and numbness to fingers 2. ONSET: "When did this start?" (minutes, hours, days; while sleeping)     Started 1 week ago 3. LAST NORMAL: "When was the last time you were normal (no symptoms)?"     1 week ago 4. PATTERN "Does this come and go, or has it been constant since it started?"  "Is it present now?"     When she wakes up has it 5. CARDIAC SYMPTOMS: "Have you had any of the following symptoms: chest pain, difficulty breathing, palpitations?"     No 6. NEUROLOGIC SYMPTOMS: "Have you had any of the following symptoms: headache, dizziness, vision loss, double vision, changes in speech, unsteady on your feet?"     No 7. OTHER SYMPTOMS: "Do you have any other symptoms?"     No 8. PREGNANCY: "Is there any chance you are pregnant?" "When was your last menstrual period?"     No  Protocols used: NEUROLOGIC DEFICIT-A-AH

## 2019-09-17 NOTE — Progress Notes (Signed)
Subjective:    Patient ID: Chelsey Hunt, female    DOB: [birth year], 54 y.o.   MRN: [id number]  HPI  Here with multiple concerns; first c/o 3rd finger right hand 1 mo intermittent color changes with turning white, then blue then back in about 1 hour with mild discomfort but no other skin changes or ulcer or gangrene; no fever, rash, or prior hx.    Warm water can make better.  Also c/o numbness and mild discomfort of the first 3 fingers of the left hand off and on, worse in the PM after work, nothing else really makes better or worse.  Pt denies chest pain, increased sob or doe, wheezing, orthopnea, PND, increased LE swelling, palpitations, dizziness or syncope.  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 Past Medical History:  Diagnosis Date  . ACID REFLUX DISEASE 01/03/2008  . Anemia   . ANEMIA, IRON DEFICIENCY 12/23/2008   history  . Chronic tension type headache 01/13/2009   migraines  . Complication of anesthesia    pt states she just needs a small amount or she will sleep too long  . Dysplasia of cervix, low grade (CIN 1)   . Fibroid   . GERD (gastroesophageal reflux disease)   . HEART MURMUR, SYSTOLIC 123456   will have echo 06/18/2013  . 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 Irritable bowel syndrome 12/23/2008  . Migraine   . Ovarian cyst   . Ovarian cyst   . THYROID NODULE, RIGHT 04/21/2009   Past Surgical History:  Procedure Laterality Date  . ABDOMINAL HYSTERECTOMY    . COLONOSCOPY    . HYSTEROSCOPY    . LAPAROSCOPY    . NOVASURE ABLATION    .  POLYPECTOMY    . TUBAL LIGATION    . UPPER GI ENDOSCOPY    . VAGINAL HYSTERECTOMY N/A 06/24/2013   Procedure: TOTAL HYSTERECTOMY VAGINAL;  Surgeon: Alwyn Pea, MD;  Location: [facility];  Service: Gynecology;  Laterality: N/A;    reports that she has never smoked. She has never used smokeless tobacco. She reports that she does not drink alcohol or use drugs. family history includes Alcoholism in her brother; Asthma in her sister; CVA in her brother; Cancer in her father; Goiter in her mother and another family member; Berenice Primas' disease in her sister; Heart attack in her father and mother; Heart disease in her father and mother; Hepatitis in her sister; Hypertension in her brother, brother, and paternal grandfather; Stomach cancer in her sister and sister; Thyroid disease in her mother. Allergies  Allergen Reactions  . Imitrex [Sumatriptan] Palpitations  . Propoxyphene N-Acetaminophen   . Nabumetone Rash    REACTION: GI upset  . Topiramate Rash    REACTION: rash   Current Outpatient Medications on File Prior to Visit  Medication Sig Dispense Refill  . acetaminophen (TYLENOL) 325 MG tablet Take 650 mg by mouth every 6 (six) hours as needed for mild pain.    . citalopram (CELEXA) 10 MG tablet Take 1 tablet (10 mg total) by mouth daily. 90 tablet 3  . co-enzyme Q-10 30  MG capsule Take 200 mg by mouth 3 (three) times daily.    . cyclobenzaprine (FLEXERIL) 5 MG tablet Take 1 tablet (5 mg total) by mouth 3 (three) times daily as needed for muscle spasms. 30 tablet 1  . Fremanezumab-vfrm (AJOVY) 225 MG/1.5ML SOAJ Inject 225 mg into the skin every 30 (thirty) days. 1 pen 11  . gabapentin (NEURONTIN) 300 MG capsule Take 1 capsule (300 mg total) by mouth at bedtime. 90 capsule 1  . ibuprofen (ADVIL,MOTRIN) 200 MG tablet Take 200 mg by mouth every 8 (eight) hours as needed.    . pantoprazole (PROTONIX) 40 MG tablet Take 40 mg by mouth 2 (two) times daily.    Marland Kitchen SYNTHROID 75 MCG tablet Take 1 tablet (75  mcg total) by mouth daily. 90 tablet 0  . Rimegepant Sulfate (NURTEC) 75 MG TBDP Take by mouth as needed.     No current facility-administered medications on file prior to visit.   Review of Systems  Constitutional: Negative for other unusual diaphoresis or sweats HENT: Negative for ear discharge or swelling Eyes: Negative for other worsening visual disturbances Respiratory: Negative for stridor or other swelling  Gastrointestinal: Negative for worsening distension or other blood Genitourinary: Negative for retention or other urinary change Musculoskeletal: Negative for other MSK pain or swelling Skin: Negative for color change or other new lesions Neurological: Negative for worsening tremors and other numbness  Psychiatric/Behavioral: Negative for worsening agitation or other fatigue All otherwise neg per pt     Objective:   Physical Exam BP 108/64   Pulse 81   Temp 98.1 F (36.7 C)   Resp 12   Ht 5' (1.524 m)   Wt 131 lb (59.4 kg)   LMP 04/20/2013   SpO2 100%   BMI 25.58 kg/m  VS noted,  Constitutional: Pt appears in NAD HENT: Head: NCAT.  Right Ear: External ear normal.  Left Ear: External ear normal.  Eyes: . Pupils are equal, round, and reactive to light. Conjunctivae and EOM are normal Nose: without d/c or deformity Neck: Neck supple. Gross normal ROM Cardiovascular: Normal rate and regular rhythm.   Pulmonary/Chest: Effort normal and breath sounds without rales or wheezing.  Abd:  Soft, NT, ND, + BS, no organomegaly Neurological: Pt is alert. At baseline orientation, motor grossly intact Skin: Skin is warm. No rashes, other new lesions, no LE edema Psychiatric: Pt behavior is normal without agitation  All otherwise neg per pt Lab Results  Component Value Date   WBC 6.0 07/06/2019   HGB 13.2 07/06/2019   HCT 40.9 07/06/2019   PLT 414 (H) 07/06/2019   GLUCOSE 84 09/17/2019   CHOL 215 (H) 03/04/2019   TRIG 69.0 03/04/2019   HDL 65.60 03/04/2019   LDLDIRECT  133.5 10/23/2013   LDLCALC 135 (H) 03/04/2019   ALT 29 01/08/2019   AST 22 01/08/2019   NA 138 09/17/2019   K 4.0 09/17/2019   CL 102 09/17/2019   CREATININE 0.95 09/17/2019   BUN 14 09/17/2019   CO2 30 09/17/2019   TSH 0.36 03/04/2019   HGBA1C 5.8 09/17/2019      Assessment & Plan:

## 2019-09-17 NOTE — Patient Instructions (Signed)
Ok to continue to monitor the right middle finger, and use warm water for episodes  Please try a left wrist splint (at night only) to help take the pressure off the nerve  Please continue all other medications as before, and refills have been done if requested.  Please have the pharmacy call with any other refills you may need.  Please keep your appointments with your specialists as you may have planned  Please go to the LAB at the blood drawing area for the tests to be done  You will be contacted by phone if any changes need to be made immediately.  Otherwise, you will receive a letter about your results with an explanation, but please check with MyChart first.  Please remember to sign up for MyChart if you have not done so, as this will be important to you in the future with finding out test results, communicating by private email, and scheduling acute appointments online when needed.  Please see Jodi Mourning NP in July as you have planned

## 2019-09-20 ENCOUNTER — Encounter: Payer: Self-pay | Admitting: Internal Medicine

## 2019-09-20 NOTE — Assessment & Plan Note (Signed)
stable overall by history and exam, recent data reviewed with pt, and pt to continue medical treatment as before,  to f/u any worsening symptoms or concerns  

## 2019-09-20 NOTE — Assessment & Plan Note (Addendum)
Mild intermittent, no associated rheum d/o, ok to follow prn,  to f/u any worsening symptoms or concerns  Note:  Total time for pt hx, exam, review of record with pt in the room, determination of diagnoses and plan for further eval and tx is > 40 min, with over 50% spent in coordination and counseling of patient including the differential dx, tx, further evaluation and other management of raynauds, hand cramps, CTS left, hypothryoidism, Hyperglycemia, HTN

## 2019-09-20 NOTE — Assessment & Plan Note (Signed)
Likely due to overuse, for lab today, declines muscle relaxer prn,  to f/u any worsening symptoms or concerns

## 2019-09-20 NOTE — Assessment & Plan Note (Signed)
Mild, for left wrist splint use qhs,  to f/u any worsening symptoms or concerns

## 2019-09-21 DIAGNOSIS — Z01419 Encounter for gynecological examination (general) (routine) without abnormal findings: Secondary | ICD-10-CM | POA: Diagnosis not present

## 2019-09-21 DIAGNOSIS — Z1231 Encounter for screening mammogram for malignant neoplasm of breast: Secondary | ICD-10-CM | POA: Diagnosis not present

## 2019-09-21 DIAGNOSIS — Z6826 Body mass index (BMI) 26.0-26.9, adult: Secondary | ICD-10-CM | POA: Diagnosis not present

## 2019-09-21 DIAGNOSIS — Z1211 Encounter for screening for malignant neoplasm of colon: Secondary | ICD-10-CM | POA: Diagnosis not present

## 2019-09-23 ENCOUNTER — Telehealth: Payer: 59 | Admitting: Neurology

## 2019-10-12 ENCOUNTER — Other Ambulatory Visit: Payer: Self-pay | Admitting: Neurology

## 2019-10-12 MED ORDER — PROMETHAZINE HCL 25 MG PO TABS: 25.0000 mg | ORAL_TABLET | Freq: Four times a day (QID) | ORAL | 5 refills | Status: DC | PRN

## 2019-10-12 MED ORDER — GABAPENTIN 100 MG PO CAPS: 100.0000 mg | ORAL_CAPSULE | Freq: Every day | ORAL | 5 refills | Status: DC

## 2019-10-12 MED FILL — GABAPENTIN 100 MG CAPSULE: 100 | 30 days supply | Qty: 30 | Fill #0

## 2019-10-12 MED FILL — PROMETHAZINE 25 MG TABLET: 25 | 8 days supply | Qty: 30 | Fill #0

## 2019-10-22 MED FILL — AJOVY 225 MG/1.5ML SOAJ: 225 | 30 days supply | Qty: 2 | Fill #5

## 2019-10-23 ENCOUNTER — Encounter: Payer: Self-pay | Admitting: *Deleted

## 2019-10-23 NOTE — Progress Notes (Signed)
Chelsey Hunt [id number] - PA Case ID: [id number] help? Call us at [phone] Status Sent to [place] 225MG /1.5ML auto-injectors Form [employer] ePA Form 2017 NCPDP

## 2019-10-27 NOTE — Progress Notes (Signed)
[name] [id number] - PA Case ID: [id number] help? Call us at [phone] Outcome Approvedon February 15 The request has been approved. The authorization is effective for a maximum of 12 fills from 10/26/2019 to 10/24/2020, as long as the member is enrolled in their current health plan. The request was approved as submitted. The request was approved for 1.72mL (1 autoinjector) per 30 days. A written notification letter will follow with additional details. Drug Ajovy 225MG /1.5ML auto-injectors Form [employer] ePA Form 2017 NCPDP

## 2019-10-27 NOTE — Progress Notes (Deleted)
Subjective:    Patient ID: Chelsey Hunt is a 54 y.o. female.  HPI   Review of Systems  Objective:  Neurological Exam  Physical Exam

## 2019-10-30 ENCOUNTER — Telehealth: Payer: Self-pay | Admitting: Neurology

## 2019-10-30 NOTE — Telephone Encounter (Signed)
Please let patient know, he just faxed it in. Thanks

## 2019-10-30 NOTE — Telephone Encounter (Signed)
Patient is wanting to make sure we got her FMLA paperwork please call

## 2019-10-30 NOTE — Telephone Encounter (Signed)
Let patient know.

## 2019-11-02 ENCOUNTER — Other Ambulatory Visit: Payer: Self-pay | Admitting: Family

## 2019-11-02 DIAGNOSIS — E039 Hypothyroidism, unspecified: Secondary | ICD-10-CM

## 2019-11-02 MED FILL — SYNTHROID 75 MCG TABLET: 75 | 90 days supply | Qty: 90 | Fill #0

## 2019-11-06 IMAGING — CR DG CHEST 2V
2 series · 2 of 2 positions shown · non-contrast
Comparison: 11/17/2015

CLINICAL DATA: Shortness of breath and palpitations for the past
week.

EXAM:
CHEST - 2 VIEW

[chest pa]
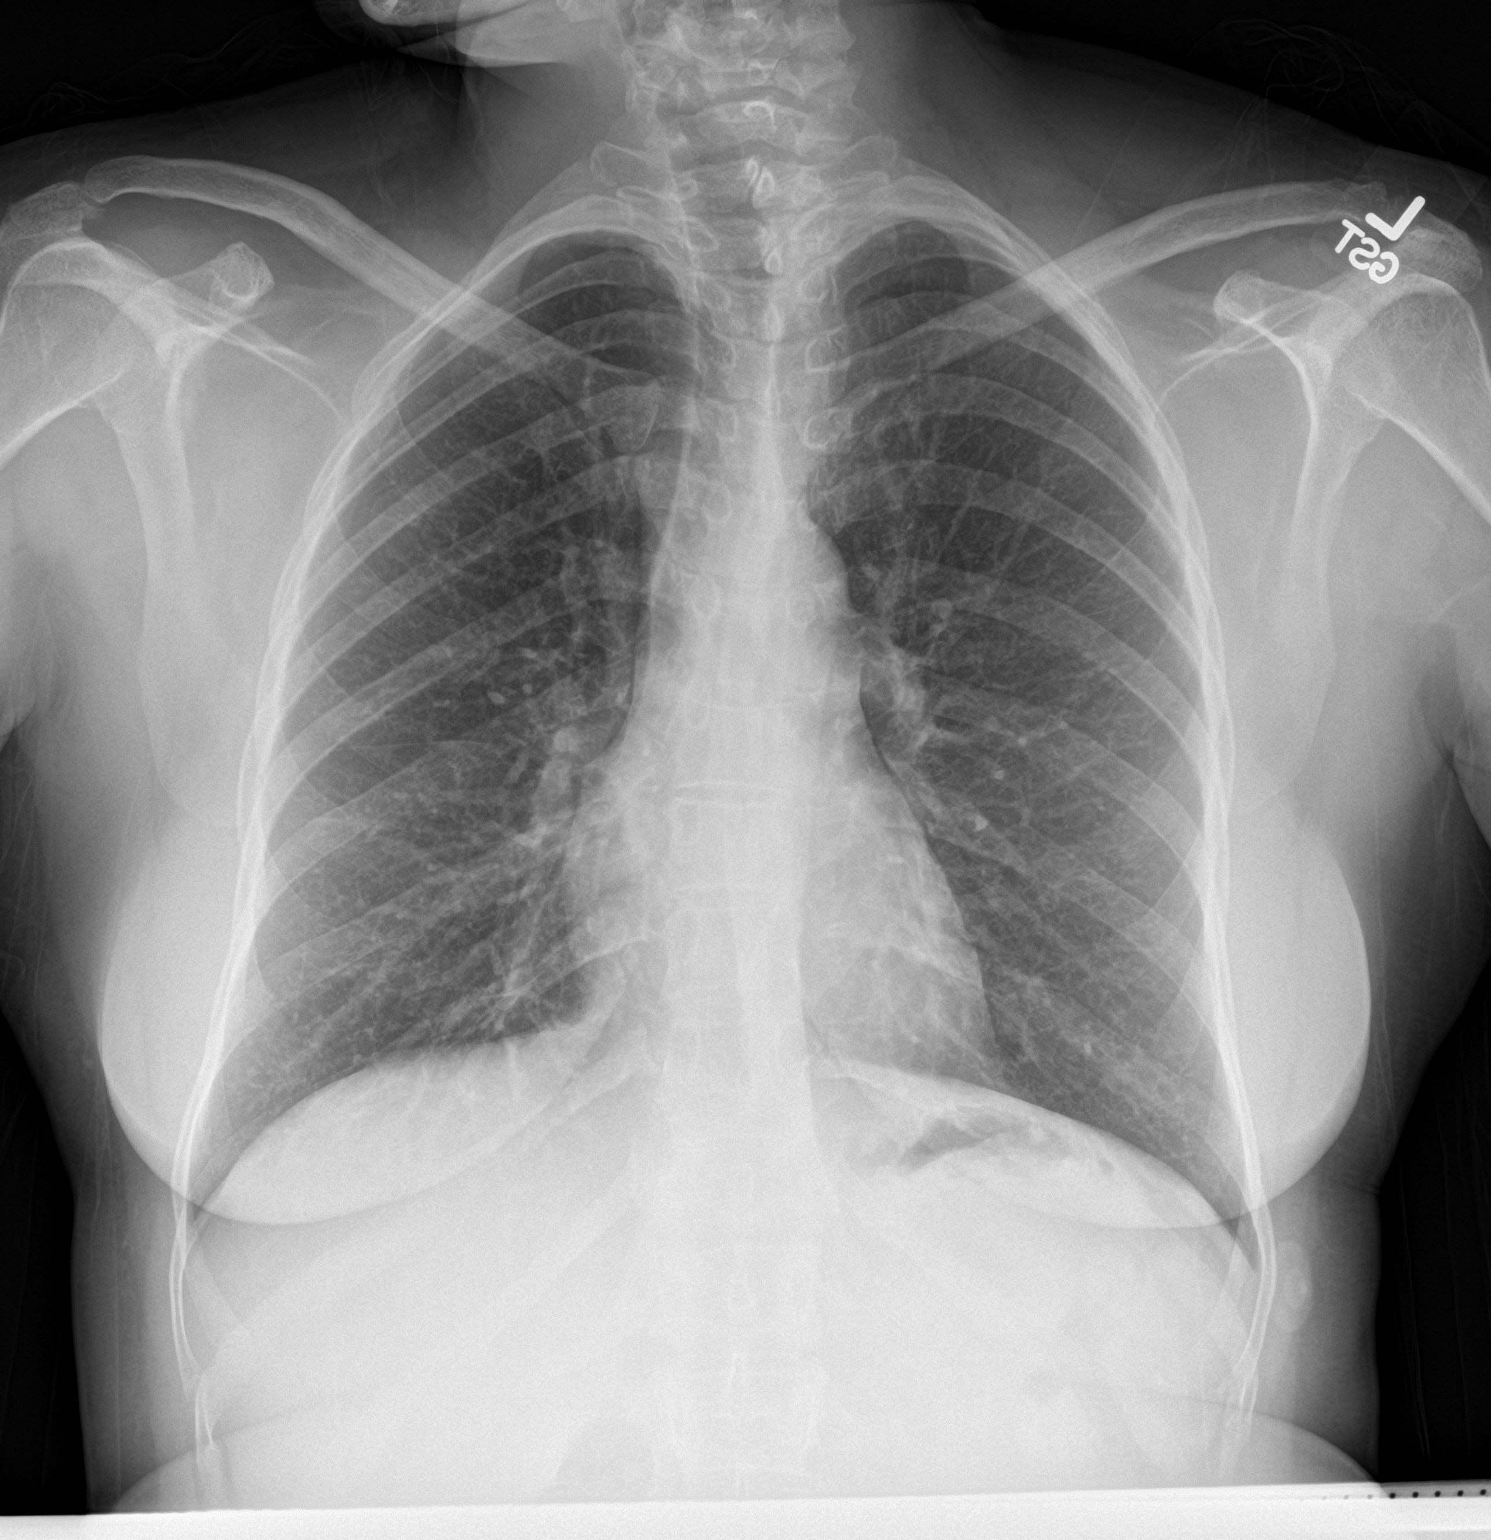

[chest lat]
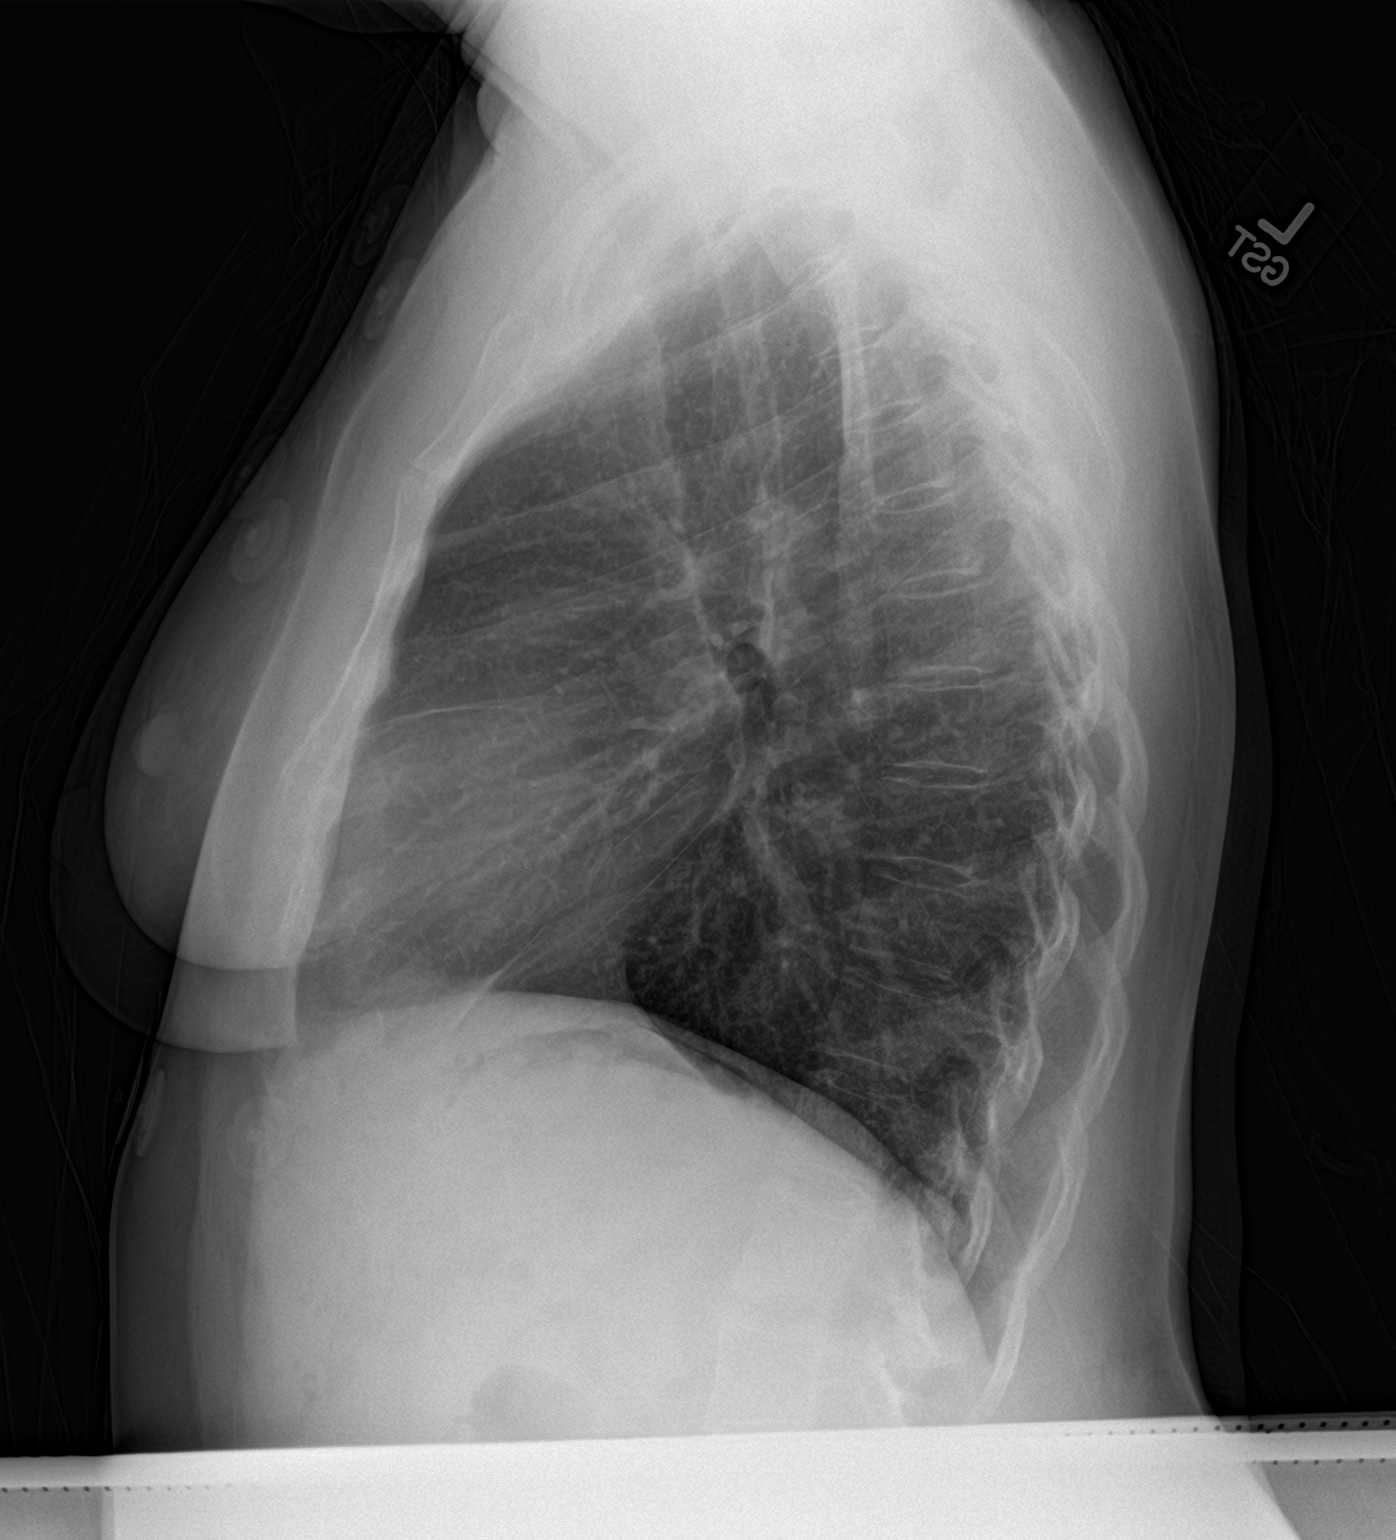

[2 of 2 positions shown; findings below may reference images not displayed]

FINDINGS: The heart size and mediastinal contours are within normal limits.
Both lungs are clear. The visualized skeletal structures are
unremarkable.
IMPRESSION: No active cardiopulmonary disease.

## 2019-11-16 MED FILL — AJOVY 225 MG/1.5ML SOAJ: 225 | 30 days supply | Qty: 2 | Fill #6

## 2019-11-17 NOTE — Progress Notes (Signed)
NEUROLOGY FOLLOW UP OFFICE NOTE  Chelsey Hunt [id number]  HISTORY OF PRESENT ILLNESS: Chelsey Hunt is a 54 year old right-handed woman with hypothyroidism who follows up for migraines.  UPDATE: For burning scalp neuralgia, she was started on gabapentin.  Couldn't tolerate it.  Now only takes gabapentin 100mg  PRN when neuralgia acts up  She says her headaches have gotten worse over past month.  She reports daily headaches for past 2 weeks.   Diffuse pounding headaches and pressure in the temples.  Still with right sided neck pain but now has shooting pain in right occipital region.  Overall, scalp burning has improved but still present.  She cannot identify any particular trigger.  She continues to have tinnitus.  ENT evaluation was okay.  Current NSAIDS: Advil Current analgesics: Tylenol Current triptans:None Current ergotamine: None Current abortive anti-CGRP:Nurtec Current anti-emetic: Promethazine Current muscle relaxants: Cyclobenzaprine 10 mg Current anti-anxiolytic: None Current sleep aide: None Current Antihypertensive medications: None Current Antidepressant medications: none Current Anticonvulsant medications: gabapentin 300mg  at bedtime Current anti-CGRP:Ajovy, Nurtec Current Vitamins/Herbal/Supplements:Co Q-10; turmeric Current Antihistamines/Decongestants: None Other therapy: None Hormone/birth control: None  Caffeine: No Diet: 5-6 bottles of water daily Exercise: Not routine Depression: No; Anxiety: Yes Other pain:neck pain. She has cervical spondylosis Sleep hygiene: Poor  HISTORY: Onset:First migraine was between61 and 54 years old. Migraine free until 2004 during her menstrual cycle. Chronic for many years. Location:Left more than right. Quality:throbbing Initial intensity:Severe.  Aura:no Prodrome:no Postdrome:fatigue Associated symptoms:Right facial tingling,nausea, photophobia, phonophobia,  osmophobia.Shedenies associated vomiting, visual disturbance orunilateral numbness or weakness. Initial duration:3 days InitialFrequency:daily InitialFrequency of abortive medication:Advil or Tylenol daily, Maxalt 2 days a week Triggers:  Certain smells, sausages, bananas; COVID-19 (contracted in September 2020) Relieving factors: Exercise Activity:aggravates  During summer 2019, she has had throbbing occipital headache, a burning scalp neuralgia, associated with tinnitus, sometimes left or right sided facial numbness, facial twitching. She started having a panic attack with palpitations. She went to the ED on 06/26/18 for symptoms. EKG showed sinus tachycardia. She was treated with headache cocktail  Past NSAIDS:Ibuprofen, naproxen, Cambia Past analgesics:Excedrin, Lidocaine nasal Past abortive triptans:Relpax, sumatriptantablet, Maxalt, Tosymra NS, Zembrace. Past abortive ergotamine:Migranal Past muscle relaxants:none Past anti-emetic:Zofran Past antihypertensive medications:Verapamil, propranolol Past antidepressant medications:Imipramine, nortriptyline Past anticonvulsant medications:topiramate Past anti-CGRP:Aimovig (aggravated migraine), Emgality (ineffective), Ubrelvy (ineffective) Past vitamins/Herbal/Supplements:Riboflavin, magnesium Past antihistamines/decongestants:None Other past therapies:acupuncture, Reyvow  Family history of headache:Mom (migraines), daughter (migraines)  MRI and MRA of the head from 03/09/2009 revealed nonspecific small subcortical white matter hyperintensities and 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  To evaluate worsening headaches, shehad an MRI of the brain on 11/21/2018 which demonstrated nonspecific white matter hyperintensities in the cerebral white matter, likely chronic small vessel ischemic changes, but no acute abnormality.  For further evaluation of  neck pain, cervical spine X-ray on 04/10/2019 showed slight refersal of the normal cervical lordosis with minimal retrolisthesis of C5 on C6 and cervical spondylosis with osteophytes most notable at C4-5 and C5-6.  PAST MEDICAL HISTORY: Past Medical History:  Diagnosis Date  . ACID REFLUX DISEASE 01/03/2008  . Anemia   . ANEMIA, IRON DEFICIENCY 12/23/2008   history  . Chronic tension type headache 01/13/2009   migraines  . Complication of anesthesia    pt states she just needs a small amount or she will sleep too long  . Dysplasia of cervix, low grade (CIN 1)   . Fibroid   . GERD (gastroesophageal reflux disease)   . HEART MURMUR,  SYSTOLIC [id number]   will have echo 06/18/2013  . 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 Irritable bowel syndrome 12/23/2008  . Migraine   . Ovarian cyst   . Ovarian cyst   . THYROID NODULE, RIGHT 04/21/2009    MEDICATIONS: Current Outpatient Medications on File Prior to Visit  Medication Sig Dispense Refill  . acetaminophen (TYLENOL) 325 MG tablet Take 650 mg by mouth every 6 (six) hours as needed for mild pain.    . citalopram (CELEXA) 10 MG tablet Take 1 tablet (10 mg total) by mouth daily. 90 tablet 3  . co-enzyme Q-10 30 MG capsule Take 200 mg by mouth 3 (three) times daily.    . cyclobenzaprine (FLEXERIL) 5 MG tablet Take 1 tablet (5 mg total) by mouth 3 (three) times daily as needed for muscle spasms. 30 tablet 1  . Fremanezumab-vfrm (AJOVY) 225 MG/1.5ML SOAJ Inject 225 mg into the skin every 30 (thirty) days. 1 pen 11  . gabapentin (NEURONTIN) 100 MG capsule Take 1 capsule (100 mg total) by mouth at bedtime. 30 capsule 5  . ibuprofen (ADVIL,MOTRIN) 200 MG tablet Take 200 mg by mouth every 8 (eight) hours as needed.    . pantoprazole (PROTONIX) 40 MG tablet Take 40 mg by mouth 2 (two) times daily.    . promethazine (PHENERGAN) 25 MG tablet Take 1 tablet (25 mg total) by mouth every 6 (six) hours as needed for nausea or vomiting. 30 tablet 5  .  Rimegepant Sulfate (NURTEC) 75 MG TBDP Take by mouth as needed.    Marland Kitchen SYNTHROID 75 MCG tablet TAKE 1 TABLET (75 MCG TOTAL) BY MOUTH DAILY. 90 tablet 0   No current facility-administered medications on file prior to visit.    ALLERGIES: Allergies  Allergen Reactions  . Imitrex [Sumatriptan] Palpitations  . Propoxyphene N-Acetaminophen   . Nabumetone Rash    REACTION: GI upset  . Topiramate Rash    REACTION: rash    FAMILY HISTORY: Family History  Problem Relation Age of Onset  . Goiter Mother   . Heart disease Mother   . Thyroid disease Mother   . Heart attack Mother   . Graves' disease Sister   . Asthma Sister   . Cancer Father   . Heart disease Father   . Heart attack Father   . CVA Brother   . Hypertension Brother   . Hypertension Brother   . Alcoholism Brother   . Stomach cancer Sister   . Hepatitis Sister   . Stomach cancer Sister   . Goiter Other        Siblings 2  . Hypertension Paternal Grandfather   . Depression Neg Hx   . Alcohol abuse Neg Hx   . Drug abuse Neg Hx   . Early death Neg Hx   . Hearing loss Neg Hx   . Kidney disease Neg Hx   . Stroke Neg Hx     SOCIAL HISTORY: Social History   Socioeconomic History  . Marital status: Married    Spouse name: Shanon Brow  . Number of children: 3  . Years of education: Not on file  . Highest education level: Associate degree: occupational, Hotel manager, or vocational program  Occupational History  . Occupation: WOMENS    Employer: [employer]  Tobacco Use  . Smoking status: Never Smoker  . Smokeless tobacco: Never Used  Substance and Sexual Activity  . Alcohol use: No    Comment: occ  . Drug use: No  . Sexual activity: Yes  Birth control/protection: Surgical    Comment: BTL  Other Topics Concern  . Not on file  Social History Narrative   Regular exercise-yes      Patient is right-handed.She lives with her husband in a 1 story home. She has recently been avoiding caffeine. Walks.   Social  Determinants of Health   Financial Resource Strain:   . Difficulty of Paying Living Expenses: Not on file  Food Insecurity:   . Worried About Charity fundraiser in the Last Year: Not on file  . Ran Out of Food in the Last Year: Not on file  Transportation Needs:   . Lack of Transportation (Medical): Not on file  . Lack of Transportation (Non-Medical): Not on file  Physical Activity:   . Days of Exercise per Week: Not on file  . Minutes of Exercise per Session: Not on file  Stress:   . Feeling of Stress : Not on file  Social Connections:   . Frequency of Communication with Friends and Family: Not on file  . Frequency of Social Gatherings with Friends and Family: Not on file  . Attends Religious Services: Not on file  . Active Member of Clubs or Organizations: Not on file  . Attends Archivist Meetings: Not on file  . Marital Status: Not on file  Intimate Partner Violence:   . Fear of Current or Ex-Partner: Not on file  . Emotionally Abused: Not on file  . Physically Abused: Not on file  . Sexually Abused: Not on file    PHYSICAL EXAM: Blood pressure 132/83, pulse 95, resp. rate 18, height 5' (1.524 m), weight 137 lb (62.1 kg), last menstrual period 04/20/2013, SpO2 100 %. General: No acute distress.  Patient appears well-groomed.   Head:  Normocephalic/atraumatic Eyes:  Fundi examined but not visualized Neck: supple, no paraspinal tenderness, full range of motion Heart:  Regular rate and rhythm Lungs:  Clear to auscultation bilaterally Back: No paraspinal tenderness Neurological Exam: alert and oriented to person, place, and time. Attention span and concentration intact, recent and remote memory intact, fund of knowledge intact.  Speech fluent and not dysarthric, language intact.  CN II-XII intact. Bulk and tone normal, muscle strength 5/5 throughout.  Sensation to light touch, temperature and vibration intact.  Deep tendon reflexes 2+ throughout, toes downgoing.   Finger to nose and heel to shin testing intact.  Gait normal, Romberg negative.  IMPRESSION: 1.  Migraine without aura, without status migrainosus, not intractable.  Recent increased frequency.  I suspect that anxiety is playing a role.   2.  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  3.  Cervical spondylosis with cervicalgia  PLAN: 1.  In case this flare up is temporary, will continue Ajovy.  If ineffective, consider Botox or possibly Vyepti. 2.  For occipital neuralgia, she will try taking gabapentin 100mg  every night at bedtime.  She defers other potential medications such as Cymbalta or Lyrica. 3.  Refer to physical therapy for neck pain. 4.  Nurtec for migraine abortive therapy. 5.  Follow up in 4 months.  Metta Clines, DO  CC: Marrian Salvage, [place]

## 2019-11-18 ENCOUNTER — Encounter: Payer: Self-pay | Admitting: Neurology

## 2019-11-18 ENCOUNTER — Ambulatory Visit (INDEPENDENT_AMBULATORY_CARE_PROVIDER_SITE_OTHER): Payer: 59 | Admitting: Neurology

## 2019-11-18 ENCOUNTER — Other Ambulatory Visit: Payer: Self-pay

## 2019-11-18 VITALS — BP 132/83 | HR 95 | Resp 18 | Ht 60.0 in | Wt 137.0 lb

## 2019-11-18 DIAGNOSIS — M5481 Occipital neuralgia: Secondary | ICD-10-CM | POA: Diagnosis not present

## 2019-11-18 DIAGNOSIS — M47812 Spondylosis without myelopathy or radiculopathy, cervical region: Secondary | ICD-10-CM | POA: Diagnosis not present

## 2019-11-18 DIAGNOSIS — G43009 Migraine without aura, not intractable, without status migrainosus: Secondary | ICD-10-CM

## 2019-11-18 NOTE — Patient Instructions (Addendum)
1. Continue Ajovy 2.  Start taking gabapentin 100mg  every night 3.  Use Nurtec and muscle relaxant if needed 4.  Physical therapy for neck pain 5.  Follow up in 3 to 4 months.   Physical therapy is to be scheduled at [facility].

## 2019-11-25 ENCOUNTER — Ambulatory Visit: Payer: 59 | Attending: Neurology | Admitting: Physical Therapy

## 2019-11-25 ENCOUNTER — Other Ambulatory Visit: Payer: Self-pay

## 2019-11-25 ENCOUNTER — Encounter: Payer: Self-pay | Admitting: Physical Therapy

## 2019-11-25 DIAGNOSIS — M62838 Other muscle spasm: Secondary | ICD-10-CM | POA: Diagnosis not present

## 2019-11-25 DIAGNOSIS — R293 Abnormal posture: Secondary | ICD-10-CM | POA: Insufficient documentation

## 2019-11-25 DIAGNOSIS — M542 Cervicalgia: Secondary | ICD-10-CM | POA: Diagnosis not present

## 2019-11-25 NOTE — Therapy (Signed)
[facility] [place], [place], [address] Phone: [phone]   Fax:  [phone]  Physical Therapy Evaluation  Patient Details  Name: Chelsey Hunt MRN: [id number] Date of Birth: 1966-04-21 Referring Provider (PT): Pieter Partridge, DO    Encounter Date: 11/25/2019  PT End of Session - 11/25/19 1618    Visit Number  1    Number of Visits  13    Date for PT Re-Evaluation  01/20/20    Authorization Type  [facility]    PT Start Time  1623    PT Stop Time  1710    PT Time Calculation (min)  47 min    Activity Tolerance  Patient tolerated treatment well    Behavior During Therapy  [facility] for tasks assessed/performed       Past Medical History:  Diagnosis Date  . ACID REFLUX DISEASE 01/03/2008  . Anemia   . ANEMIA, IRON DEFICIENCY 12/23/2008   history  . Anxiety   . Chronic tension type headache 01/13/2009   migraines  . Complication of anesthesia    pt states she just needs a small amount or she will sleep too long  . Dysplasia of cervix, low grade (CIN 1)   . Fibroid   . GERD (gastroesophageal reflux disease)   . HEART MURMUR, SYSTOLIC [id number]   will have echo 06/18/2013  . 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 Irritable bowel syndrome 12/23/2008  . Migraine   . Ovarian cyst   . Ovarian cyst   . THYROID NODULE, RIGHT 04/21/2009    Past Surgical History:  Procedure Laterality Date  . ABDOMINAL HYSTERECTOMY    . COLONOSCOPY    . HYSTEROSCOPY    . LAPAROSCOPY    . NOVASURE ABLATION    . POLYPECTOMY    . TUBAL LIGATION    . UPPER GI ENDOSCOPY    . VAGINAL HYSTERECTOMY N/A 06/24/2013   Procedure: TOTAL HYSTERECTOMY VAGINAL;  Surgeon: Alwyn Pea, MD;  Location: [facility];  Service: Gynecology;  Laterality: N/A;    There were no vitals filed for this visit.   Subjective Assessment - 11/25/19 1630    Subjective  pt is a 54 y.o with CC of chronic HA/ migraines and neck pain that started abotu 6  months ago with no specific MOI. She reported feeling like th eneck pain is related to stress. pain stays in the neck  bil with L>R. she reports intermittent N/T into the face and bil hands that occurs with migraines. She reprots no changes since onset.    How long can you sit comfortably?  unlimited    How long can you stand comfortably?  unlimited    How long can you walk comfortably?  unlimited    Diagnostic tests  11/21/2018 Progressive foci of T2 and FLAIR signal within the cerebralhemispheric deep and subcortical white matter, when compared to thestudy of 2010. Most likely diagnosis is progressive small-vesselchange of the white matter. The differential diagnosis does includedemyelinating disease or migraine related foci, but those are feltless likely.   x-ray 04/10/2019 IMPRESSION:1. Reversal of the normal cervical lordosis with a minimalretrolisthesis of C5 on C6.2. Cervical spine spondylosis most notable at C4-C5 and C5-C6.    Patient Stated Goals  getting back to exercise    Currently in Pain?  Yes    Pain Score  2    last took meds for pain around 11 am   Pain Location  Neck    Pain Orientation  Right;Left   L>R   Pain Descriptors / Indicators  Aching;Throbbing;Burning;Sharp    Pain Type  Chronic pain    Pain Onset  More than a month ago    Pain Frequency  Constant    Aggravating Factors   moving around, laying down,    Pain Relieving Factors  laying down         [facility] PT Assessment - 11/25/19 1619      Assessment   Medical Diagnosis  Migraine without aura and without status migrainosus, not intractable G43.009, Occipital neuralgia of right side M54.81, Cervical spondylosis without myelopathy [id number]    Referring Provider (PT)  Pieter Partridge, DO     Onset Date/Surgical Date  --   migraines 13 years, and neck 6 months   Hand Dominance  Right    Next MD Visit  July 2021    Prior Therapy  no      Precautions   Precautions  None      Restrictions   Weight Bearing  Restrictions  No      Balance Screen   Has the patient fallen in the past 6 months  No    Has the patient had a decrease in activity level because of a fear of falling?   No    Is the patient reluctant to leave their home because of a fear of falling?   No      Home Environment   Living Environment  Private residence    Living Arrangements  Spouse/significant other    Available Help at Discharge  Family    Type of [place] to enter    Entrance Stairs-Number of Steps  4    Entrance Stairs-Rails  Can reach both    [place]  One level      Prior Function   Level of Independence  Independent with basic ADLs    Vocation  Full time employment    Vocation Requirements  walking. standing, lifting. pushing, rolling,       Cognition   Overall Cognitive Status  Within Functional Limits for tasks assessed      Observation/Other Assessments   Focus on Therapeutic Outcomes (FOTO)   39% limited, predicted 33% limited      Posture/Postural Control   Posture/Postural Control  Postural limitations    Postural Limitations  Decreased thoracic kyphosis      ROM / Strength   AROM / PROM / Strength  AROM;Strength      AROM   Overall AROM Comments  shoulder AROM WFL    AROM Assessment Site  Cervical;Shoulder    Right/Left Shoulder  Right;Left    Cervical Flexion  44    Cervical Extension  42   concordant pain at end range   Cervical - Right Side Bend  30    Cervical - Left Side Bend  38   contralateral stitffness end range   Cervical - Right Rotation  63    Cervical - Left Rotation  63   end range stiffness on the L     Strength   Strength Assessment Site  Shoulder    Right/Left Shoulder  Right;Left    Right Shoulder Flexion  4+/5    Right Shoulder Extension  4+/5    Right Shoulder ABduction  4-/5    Right Shoulder Internal Rotation  4+/5    Right Shoulder External Rotation  4+/5    Left Shoulder Flexion  4+/5    Left Shoulder Extension  4/5    Left  Shoulder ABduction  4-/5    Left Shoulder Internal Rotation  4+/5    Left Shoulder External Rotation  4/5      Palpation   Palpation comment  TTP along along bil upper trap / levator scapulare, sub-occipitals with multiple trigger points noted.                 Objective measurements completed on examination: See above findings.      [place] Adult PT Treatment/Exercise - 11/25/19 1619      Exercises   Exercises  Neck      Neck Exercises: Seated   Other Seated Exercise  scapular retraction 1 x 10       Neck Exercises: Supine   Neck Retraction  10 reps;5 secs      Manual Therapy   Manual therapy comments  MTPR along bil upper trap / levator scapuale x1 ea. Sub-occipital release       Neck Exercises: Stretches   Upper Trapezius Stretch  2 reps;30 seconds;Left;Right    Levator Stretch  1 rep;Left;Right;30 seconds             PT Education - 11/25/19 1620    Education Details  evaluation findings, POC, goals, HEP with proper form/ rationale. What TPDN is, benefits and whato expect.    Person(s) Educated  Patient    Methods  Explanation;Verbal cues;Handout    Comprehension  Verbalized understanding;Verbal cues required       PT Short Term Goals - 11/25/19 1734      PT SHORT TERM GOAL #1   Title  pt to be I with inital HEP    Time  3    Period  Weeks    Status  New    Target Date  12/16/19      PT SHORT TERM GOAL #2   Title  pt to report decrease upper trap and surrounding muscle spasm to reduce max pain to </=2/10    Time  3    Period  Weeks    Status  New    Target Date  12/16/19        PT Long Term Goals - 11/25/19 1734      PT LONG TERM GOAL #1   Title  pt to verbalize/ demo efficient posture and lifting mechanics to reduce and prevent neck pain    Time  6    Period  Weeks    Status  New    Target Date  01/20/20      PT LONG TERM GOAL #2   Title  increase FOTO score to </= 33% limited to demo improvement in function    Time  6    Period   Weeks    Status  New    Target Date  01/20/20      PT LONG TERM GOAL #3   Title  pt to report no HA for >/= 1 week to promote improvement in condition and QOL    Time  6    Period  Weeks    Status  New    Target Date  01/20/20      PT LONG TERM GOAL #4   Title  pt to return to personal exercise routine per pt's personal goals to promote QOL    Time  6    Period  Weeks    Status  New    Target Date  01/20/20      PT LONG TERM GOAL #5   Title  pt to be I with all HEP given as of last visit to maintain and progress current level of function    Time  6    Period  Weeks    Status  New    Target Date  01/20/20             Plan - 11/25/19 1638    Clinical Impression Statement  pt presents to OPPT with CC or chronic migraines and neck pain started 6 months ago with no specific MOI. She has functional neck ROM noting end range concordant pain. mild weakness noted in the LUE compard bil with concordant symptoms during testing. multiple trigger points in bil upper trap/levator scap and sub-occipitals. Pt responded well to MTRP and stretching reporting significant reduction in HA and neck pain. She would benefit from physical therapy to decrease neck pain, reduce muscle spasm, decreaes HA frequency and maximize function by addressing the deficits listed.    Personal Factors and Comorbidities  Comorbidity 2    Comorbidities  hx of anemia, anxiety    Stability/Clinical Decision Making  Evolving/Moderate complexity    Clinical Decision Making  Moderate    Rehab Potential  Good    PT Frequency  2x / week    PT Duration  6 weeks    PT Treatment/Interventions  ADLs/Self Care Home Management;Cryotherapy;Electrical Stimulation;Iontophoresis 4mg /ml Dexamethasone;Moist Heat;Traction;Ultrasound;Therapeutic activities;Therapeutic exercise;Neuromuscular re-education;Manual techniques;Dry needling;Taping;Patient/family education    PT Next Visit Plan  review/ update HEP. provide posture handout,  dicuss potential DN, STW along bil upper trap/ levator scapulae, posterior chain strengthening.    PT Home Exercise Plan  [phone] W - chin tuck (supine), upper trap stretch, levator scapulae stretch, scapular retraction    Consulted and Agree with Plan of Care  Patient       Patient will benefit from skilled therapeutic intervention in order to improve the following deficits and impairments:  Improper body mechanics, Increased muscle spasms, Decreased strength, Pain  Visit Diagnosis: Cervicalgia  Abnormal posture  Other muscle spasm     Problem List Patient Active Problem List   Diagnosis Date Noted  . Raynaud phenomenon 09/17/2019  . Hand cramps 09/17/2019  . Carpal tunnel syndrome on left 09/17/2019  . Anxiety 07/30/2019  . Rash and nonspecific skin eruption 07/01/2019  . Musculoskeletal strain 03/25/2019  . Visit for screening mammogram 03/04/2019  . Chronic idiopathic constipation 01/08/2019  . Essential hypertension 10/24/2018  . Psychophysiological insomnia 07/07/2018  . Seasonal allergic rhinitis due to pollen 08/08/2017  . Intractable migraine without aura and without status migrainosus 07/02/2016  . Hyperglycemia 11/28/2015  . Pancreas divisum of native pancreas 12/27/2014  . Routine general medical examination at a health care facility 07/30/2012  . GERD (gastroesophageal reflux disease)   . Hypothyroidism 08/12/2009   Starr Lake PT, DPT, LAT, ATC  11/25/19  5:43 PM      [facility] [address] [place], [place], [address] Phone: [phone]   Fax:  [phone]  Name: Kharizma Mosbey MRN: [id number] Date of Birth: Dec 11, 1965

## 2019-11-30 ENCOUNTER — Ambulatory Visit: Payer: 59 | Admitting: Physical Therapy

## 2019-11-30 ENCOUNTER — Other Ambulatory Visit: Payer: Self-pay

## 2019-11-30 DIAGNOSIS — M542 Cervicalgia: Secondary | ICD-10-CM

## 2019-11-30 DIAGNOSIS — R293 Abnormal posture: Secondary | ICD-10-CM

## 2019-11-30 DIAGNOSIS — M62838 Other muscle spasm: Secondary | ICD-10-CM

## 2019-11-30 NOTE — Therapy (Signed)
[place], [place], [address] Phone: [phone]   Fax:  [phone]  Physical Therapy Treatment  Patient Details  Name: Chelsey Hunt MRN: [id number] Date of Birth: 01/20/1966 Referring Provider (PT): Pieter Partridge, DO    Encounter Date: 11/30/2019  PT End of Session - 11/30/19 0800    Visit Number  2    Number of Visits  13    Date for PT Re-Evaluation  01/20/20    Authorization Type  [employer]    PT Start Time  0800    PT Stop Time  0840    PT Time Calculation (min)  40 min    Activity Tolerance  Patient tolerated treatment well    Behavior During Therapy  [facility] for tasks assessed/performed       Past Medical History:  Diagnosis Date  . ACID REFLUX DISEASE 01/03/2008  . Anemia   . ANEMIA, IRON DEFICIENCY 12/23/2008   history  . Anxiety   . Chronic tension type headache 01/13/2009   migraines  . Complication of anesthesia    pt states she just needs a small amount or she will sleep too long  . Dysplasia of cervix, low grade (CIN 1)   . Fibroid   . GERD (gastroesophageal reflux disease)   . HEART MURMUR, SYSTOLIC [id number]   will have echo 06/18/2013  . 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 Irritable bowel syndrome 12/23/2008  . Migraine   . Ovarian cyst   . Ovarian cyst   . THYROID NODULE, RIGHT 04/21/2009    Past Surgical History:  Procedure Laterality Date  . ABDOMINAL HYSTERECTOMY    . COLONOSCOPY    . HYSTEROSCOPY    . LAPAROSCOPY    . NOVASURE ABLATION    . POLYPECTOMY    . TUBAL LIGATION    . UPPER GI ENDOSCOPY    . VAGINAL HYSTERECTOMY N/A 06/24/2013   Procedure: TOTAL HYSTERECTOMY VAGINAL;  Surgeon: Alwyn Pea, MD;  Location: [facility];  Service: Gynecology;  Laterality: N/A;    There were no vitals filed for this visit.  Subjective Assessment - 11/30/19 0801    Subjective  "I am feeling better today. I did get some soreness at work last night but did the  stretches and it helped"    Diagnostic tests  11/21/2018 Progressive foci of T2 and FLAIR signal within the cerebralhemispheric deep and subcortical white matter, when compared to thestudy of 2010. Most likely diagnosis is progressive small-vesselchange of the white matter. The differential diagnosis does includedemyelinating disease or migraine related foci, but those are feltless likely.   x-ray 04/10/2019 IMPRESSION:1. Reversal of the normal cervical lordosis with a minimalretrolisthesis of C5 on C6.2. Cervical spine spondylosis most notable at C4-C5 and C5-C6.    Currently in Pain?  Yes    Pain Score  0-No pain    Pain Location  Neck    Pain Orientation  Right;Left    Pain Onset  More than a month ago         [facility] PT Assessment - 11/30/19 0001      Assessment   Medical Diagnosis  Migraine without aura and without status migrainosus, not intractable G43.009, Occipital neuralgia of right side M54.81, Cervical spondylosis without myelopathy [id number]    Referring Provider (PT)  Pieter Partridge, DO                    [facility] Adult PT Treatment/Exercise - 11/30/19 0001  Self-Care   Self-Care  Other Self-Care Comments    Other Self-Care Comments   manual trigger point release and how to perform at home and tools that can assist with treatment      Neck Exercises: Machines for Strengthening   UBE (Upper Arm Bike)  L2 x 4 min    FWD/ BWD x 2 min     Neck Exercises: Theraband   Other Theraband Exercises  scapular retraction with bil ER 2 x 10 with green theraband   cues to keep chin tucked      Neck Exercises: Seated   Neck Retraction  10 reps;5 secs    Other Seated Exercise  thoracic rotation 2 x 10 with arms crossed and thoracic extension over back of the chair with cues       Neck Exercises: Stretches   Upper Trapezius Stretch  2 reps;30 seconds;Left;Right   reviewed stretch and to let contralateral side relax   Levator Stretch  2 reps;30 seconds   reviewed stretch and  to let contralateral side relax            PT Education - 11/30/19 0842    Education Details  reviewed HEP    Person(s) Educated  Patient    Methods  Explanation;Verbal cues    Comprehension  Verbalized understanding;Verbal cues required       PT Short Term Goals - 11/25/19 1734      PT SHORT TERM GOAL #1   Title  pt to be I with inital HEP    Time  3    Period  Weeks    Status  New    Target Date  12/16/19      PT SHORT TERM GOAL #2   Title  pt to report decrease upper trap and surrounding muscle spasm to reduce max pain to </=2/10    Time  3    Period  Weeks    Status  New    Target Date  12/16/19        PT Long Term Goals - 11/25/19 1734      PT LONG TERM GOAL #1   Title  pt to verbalize/ demo efficient posture and lifting mechanics to reduce and prevent neck pain    Time  6    Period  Weeks    Status  New    Target Date  01/20/20      PT LONG TERM GOAL #2   Title  increase FOTO score to </= 33% limited to demo improvement in function    Time  6    Period  Weeks    Status  New    Target Date  01/20/20      PT LONG TERM GOAL #3   Title  pt to report no HA for >/= 1 week to promote improvement in condition and QOL    Time  6    Period  Weeks    Status  New    Target Date  01/20/20      PT LONG TERM GOAL #4   Title  pt to return to personal exercise routine per pt's personal goals to promote QOL    Time  6    Period  Weeks    Status  New    Target Date  01/20/20      PT LONG TERM GOAL #5   Title  pt to be I with all HEP given as of last visit to maintain and progress current  level of function    Time  6    Period  Weeks    Status  New    Target Date  01/20/20            Plan - 11/30/19 [id number]    Clinical Impression Statement  pt reports consistency with her HEP and reports no pain today and had some pain at work and noted reduced pain with exercies. pt opted to hold off on TPDN so focused on MTPR using tools and provided handout for and  where she can purchase one. she did well with posterior shoulder strengthening and thoracic mobility exercises.    PT Next Visit Plan  review/ update HEP. provide posture handout, dicuss potential DN, STW along bil upper trap/ levator scapulae, posterior chain strengthening.    PT Home Exercise Plan  [phone] W - chin tuck (supine), upper trap stretch, levator scapulae stretch, scapular retraction       Patient will benefit from skilled therapeutic intervention in order to improve the following deficits and impairments:  Improper body mechanics, Increased muscle spasms, Decreased strength, Pain  Visit Diagnosis: Cervicalgia  Abnormal posture  Other muscle spasm     Problem List Patient Active Problem List   Diagnosis Date Noted  . Raynaud phenomenon 09/17/2019  . Hand cramps 09/17/2019  . Carpal tunnel syndrome on left 09/17/2019  . Anxiety 07/30/2019  . Rash and nonspecific skin eruption 07/01/2019  . Musculoskeletal strain 03/25/2019  . Visit for screening mammogram 03/04/2019  . Chronic idiopathic constipation 01/08/2019  . Essential hypertension 10/24/2018  . Psychophysiological insomnia 07/07/2018  . Seasonal allergic rhinitis due to pollen 08/08/2017  . Intractable migraine without aura and without status migrainosus 07/02/2016  . Hyperglycemia 11/28/2015  . Pancreas divisum of native pancreas 12/27/2014  . Routine general medical examination at a health care facility 07/30/2012  . GERD (gastroesophageal reflux disease)   . Hypothyroidism 08/12/2009   Starr Lake PT, DPT, LAT, ATC  11/30/19  8:43 AM      [facility] [address] [place], [place], [address] Phone: [phone]   Fax:  [phone]  Name: Chelsey Hunt MRN: [id number] Date of Birth: 1966-05-06

## 2019-12-01 MED FILL — PANTOPRAZOLE SOD DR 40 MG T: 40 | 90 days supply | Qty: 180 | Fill #0

## 2019-12-07 ENCOUNTER — Other Ambulatory Visit: Payer: Self-pay

## 2019-12-07 ENCOUNTER — Ambulatory Visit: Payer: 59 | Admitting: Physical Therapy

## 2019-12-07 DIAGNOSIS — R293 Abnormal posture: Secondary | ICD-10-CM | POA: Diagnosis not present

## 2019-12-07 DIAGNOSIS — M62838 Other muscle spasm: Secondary | ICD-10-CM | POA: Diagnosis not present

## 2019-12-07 DIAGNOSIS — M542 Cervicalgia: Secondary | ICD-10-CM

## 2019-12-07 NOTE — Therapy (Signed)
[place], [place], [address] Phone: [phone]   Fax:  [phone]  Physical Therapy Treatment  Patient Details  Name: Chelsey Hunt MRN: [id number] Date of Birth: 09/13/1965 Referring Provider (PT): Pieter Partridge, DO    Encounter Date: 12/07/2019  PT End of Session - 12/07/19 0802    Visit Number  3    Number of Visits  13    Date for PT Re-Evaluation  01/20/20    Authorization Type  [employer]    PT Start Time  0801    PT Stop Time  0841    PT Time Calculation (min)  40 min    Activity Tolerance  Patient tolerated treatment well    Behavior During Therapy  [facility] for tasks assessed/performed       Past Medical History:  Diagnosis Date  . ACID REFLUX DISEASE 01/03/2008  . Anemia   . ANEMIA, IRON DEFICIENCY 12/23/2008   history  . Anxiety   . Chronic tension type headache 01/13/2009   migraines  . Complication of anesthesia    pt states she just needs a small amount or she will sleep too long  . Dysplasia of cervix, low grade (CIN 1)   . Fibroid   . GERD (gastroesophageal reflux disease)   . HEART MURMUR, SYSTOLIC 123456   will have echo 06/18/2013  . 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 Irritable bowel syndrome 12/23/2008  . Migraine   . Ovarian cyst   . Ovarian cyst   . THYROID NODULE, RIGHT 04/21/2009    Past Surgical History:  Procedure Laterality Date  . ABDOMINAL HYSTERECTOMY    . COLONOSCOPY    . HYSTEROSCOPY    . LAPAROSCOPY    . NOVASURE ABLATION    . POLYPECTOMY    . TUBAL LIGATION    . UPPER GI ENDOSCOPY    . VAGINAL HYSTERECTOMY N/A 06/24/2013   Procedure: TOTAL HYSTERECTOMY VAGINAL;  Surgeon: Alwyn Pea, MD;  Location: [facility];  Service: Gynecology;  Laterality: N/A;    There were no vitals filed for this visit.  Subjective Assessment - 12/07/19 0803    Subjective  "No pain or issues today. Everything is doing okay, yesterday I did have a HA but I think  it was from fasting"    Diagnostic tests  11/21/2018 Progressive foci of T2 and FLAIR signal within the cerebralhemispheric deep and subcortical white matter, when compared to thestudy of 2010. Most likely diagnosis is progressive small-vesselchange of the white matter. The differential diagnosis does includedemyelinating disease or migraine related foci, but those are feltless likely.   x-ray 04/10/2019 IMPRESSION:1. Reversal of the normal cervical lordosis with a minimalretrolisthesis of C5 on C6.2. Cervical spine spondylosis most notable at C4-C5 and C5-C6.    Currently in Pain?  Yes    Pain Score  0-No pain    Pain Orientation  Right;Left    Pain Onset  More than a month ago    Pain Frequency  Intermittent    Aggravating Factors   intermitent with increased with activity    Pain Relieving Factors  self trigger point         [facility] PT Assessment - 12/07/19 0001      Assessment   Medical Diagnosis  Migraine without aura and without status migrainosus, not intractable G43.009, Occipital neuralgia of right side M54.81, Cervical spondylosis without myelopathy [id number]    Referring Provider (PT)  Pieter Partridge, DO  [place] Adult PT Treatment/Exercise - 12/07/19 0001      Neck Exercises: Machines for Strengthening   UBE (Upper Arm Bike)  L1 x 5 min    fwd/bwd 2:30     Neck Exercises: Theraband   Other Theraband Exercises  scapular retraction with bil ER 2 x 10 with red therband      Neck Exercises: Seated   Cervical Isometrics  Right rotation;Left rotation;Right lateral flexion;Left lateral flexion;5 secs;10 reps    Other Seated Exercise  self rib mobs using sheet and breathing in/out deeply for MWM      Manual Therapy   Manual Therapy  Joint mobilization;Soft tissue mobilization;Manual Traction    Joint Mobilization  C3-C7 PA grade III,  R lateral gapping C3-C7 grade III,  R first rib mob grade III    Soft tissue mobilization  sub-occipital release and MTPR  along levator scapuale and upper trap R only    Manual Traction  5 x 1 min hold      Neck Exercises: Stretches   Upper Trapezius Stretch  2 reps;30 seconds;Right    Levator Stretch  2 reps;30 seconds;Right             PT Education - 12/07/19 [id number]    Education Details  reviewed and updated HEP    Person(s) Educated  Patient    Methods  Explanation;Verbal cues;Handout    Comprehension  Verbalized understanding;Verbal cues required       PT Short Term Goals - 11/25/19 1734      PT SHORT TERM GOAL #1   Title  pt to be I with inital HEP    Time  3    Period  Weeks    Status  New    Target Date  12/16/19      PT SHORT TERM GOAL #2   Title  pt to report decrease upper trap and surrounding muscle spasm to reduce max pain to </=2/10    Time  3    Period  Weeks    Status  New    Target Date  12/16/19        PT Long Term Goals - 11/25/19 1734      PT LONG TERM GOAL #1   Title  pt to verbalize/ demo efficient posture and lifting mechanics to reduce and prevent neck pain    Time  6    Period  Weeks    Status  New    Target Date  01/20/20      PT LONG TERM GOAL #2   Title  increase FOTO score to </= 33% limited to demo improvement in function    Time  6    Period  Weeks    Status  New    Target Date  01/20/20      PT LONG TERM GOAL #3   Title  pt to report no HA for >/= 1 week to promote improvement in condition and QOL    Time  6    Period  Weeks    Status  New    Target Date  01/20/20      PT LONG TERM GOAL #4   Title  pt to return to personal exercise routine per pt's personal goals to promote QOL    Time  6    Period  Weeks    Status  New    Target Date  01/20/20      PT LONG TERM GOAL #5   Title  pt to be I with all HEP given as of last visit to maintain and progress current level of function    Time  6    Period  Weeks    Status  New    Target Date  01/20/20            Plan - 12/07/19 [id number]    Clinical Impression Statement  [name]  continues to make progress with physical therapy noting decreased frequency of HA. pt cotninues to hold off on TPDN so focused manual trigger point release techinques and cervical/ rib mobs. continued working posterior shoulder strengthening and cervical isometircs which where given as HEP today. end of session she noted no pain.    PT Treatment/Interventions  ADLs/Self Care Home Management;Cryotherapy;Electrical Stimulation;Iontophoresis 4mg /ml Dexamethasone;Moist Heat;Traction;Ultrasound;Therapeutic activities;Therapeutic exercise;Neuromuscular re-education;Manual techniques;Dry needling;Taping;Patient/family education    PT Next Visit Plan  review/ update HEP. provide posture handout, dicuss potential DN, STW along bil upper trap/ levator scapulae, posterior chain strengthening.    PT Home Exercise Plan  [phone] W - chin tuck (supine), upper trap stretch, levator scapulae stretch, scapular retraction, self rib mobs, money, cervical isometrics    Consulted and Agree with Plan of Care  Patient       Patient will benefit from skilled therapeutic intervention in order to improve the following deficits and impairments:  Improper body mechanics, Increased muscle spasms, Decreased strength, Pain  Visit Diagnosis: Cervicalgia  Abnormal posture  Other muscle spasm     Problem List Patient Active Problem List   Diagnosis Date Noted  . Raynaud phenomenon 09/17/2019  . Hand cramps 09/17/2019  . Carpal tunnel syndrome on left 09/17/2019  . Anxiety 07/30/2019  . Rash and nonspecific skin eruption 07/01/2019  . Musculoskeletal strain 03/25/2019  . Visit for screening mammogram 03/04/2019  . Chronic idiopathic constipation 01/08/2019  . Essential hypertension 10/24/2018  . Psychophysiological insomnia 07/07/2018  . Seasonal allergic rhinitis due to pollen 08/08/2017  . Intractable migraine without aura and without status migrainosus 07/02/2016  . Hyperglycemia 11/28/2015  . Pancreas divisum of  native pancreas 12/27/2014  . Routine general medical examination at a health care facility 07/30/2012  . GERD (gastroesophageal reflux disease)   . Hypothyroidism 08/12/2009   Starr Lake PT, DPT, LAT, ATC  12/07/19  8:44 AM      [facility] [address] [place], [place], [address] Phone: [phone]   Fax:  [phone]  Name: Tawonna Kalp MRN: [id number] Date of Birth: 18-Dec-1965

## 2019-12-15 ENCOUNTER — Ambulatory Visit: Payer: 59 | Attending: Neurology | Admitting: Physical Therapy

## 2019-12-15 ENCOUNTER — Other Ambulatory Visit: Payer: Self-pay

## 2019-12-15 DIAGNOSIS — R293 Abnormal posture: Secondary | ICD-10-CM | POA: Insufficient documentation

## 2019-12-15 DIAGNOSIS — M62838 Other muscle spasm: Secondary | ICD-10-CM | POA: Diagnosis not present

## 2019-12-15 DIAGNOSIS — M542 Cervicalgia: Secondary | ICD-10-CM | POA: Insufficient documentation

## 2019-12-15 MED FILL — AJOVY 225 MG/1.5ML SOAJ: 225 | 30 days supply | Qty: 2 | Fill #7

## 2019-12-15 NOTE — Therapy (Signed)
[place], [place], [address] Phone: [phone]   Fax:  [phone]  Physical Therapy Treatment  Patient Details  Name: Chelsey Hunt MRN: [id number] Date of Birth: February 17, 1966 Referring Provider (PT): Pieter Partridge, DO    Encounter Date: 12/15/2019  PT End of Session - 12/15/19 0809    Visit Number  4    Number of Visits  13    Date for PT Re-Evaluation  01/20/20    Authorization Type  [facility] [employer]    PT Start Time  0802    PT Stop Time  0841    PT Time Calculation (min)  39 min    Activity Tolerance  Patient tolerated treatment well    Behavior During Therapy  [facility] for tasks assessed/performed       Past Medical History:  Diagnosis Date  . ACID REFLUX DISEASE 01/03/2008  . Anemia   . ANEMIA, IRON DEFICIENCY 12/23/2008   history  . Anxiety   . Chronic tension type headache 01/13/2009   migraines  . Complication of anesthesia    pt states she just needs a small amount or she will sleep too long  . Dysplasia of cervix, low grade (CIN 1)   . Fibroid   . GERD (gastroesophageal reflux disease)   . HEART MURMUR, SYSTOLIC [id number]   will have echo 06/18/2013  . 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 Irritable bowel syndrome 12/23/2008  . Migraine   . Ovarian cyst   . Ovarian cyst   . THYROID NODULE, RIGHT 04/21/2009    Past Surgical History:  Procedure Laterality Date  . ABDOMINAL HYSTERECTOMY    . COLONOSCOPY    . HYSTEROSCOPY    . LAPAROSCOPY    . NOVASURE ABLATION    . POLYPECTOMY    . TUBAL LIGATION    . UPPER GI ENDOSCOPY    . VAGINAL HYSTERECTOMY N/A 06/24/2013   Procedure: TOTAL HYSTERECTOMY VAGINAL;  Surgeon: Alwyn Pea, MD;  Location: [facility];  Service: Gynecology;  Laterality: N/A;    There were no vitals filed for this visit.  Subjective Assessment - 12/15/19 0806    Subjective  "I am doing pretty good, I did get a tool and I have been using it alot at home"    Currently in Pain?  Yes    Pain Score  2     Pain Orientation  Right    Pain Descriptors / Indicators  Aching    Pain Type  Chronic pain    Aggravating Factors   work and increased activity    Pain Relieving Factors  self trigger point release         [facility] PT Assessment - 12/15/19 0001      Assessment   Medical Diagnosis  Migraine without aura and without status migrainosus, not intractable G43.009, Occipital neuralgia of right side M54.81, Cervical spondylosis without myelopathy [id number]    Referring Provider (PT)  Pieter Partridge, DO                    [facility] Adult PT Treatment/Exercise - 12/15/19 0001      Neck Exercises: Machines for Strengthening   UBE (Upper Arm Bike)  L3 x 5 min    2:30 FWD/BWD     Neck Exercises: Seated   Other Seated Exercise  seated on green physioball protroaction into row/ retraction with external rotation with red theraband      Neck Exercises: Supine  Other Supine Exercise  thoracic extension over pink bolster 2 x 10 with hands behind head and elbows adducted    Other Supine Exercise  foam roll rountine: ceiling punches, horizontal abd/adduction, alternating ceiling punches, X to Y and back stroke 2 x 10 ea.   foam roll parallel to spine     Neck Exercises: Prone   Other Prone Exercise  in quadruped shoulder Y's with contralateral shoulder protraction 2 x 10 bil   2nd set with 1#      Neck Exercises: Stretches   Upper Trapezius Stretch  2 reps;30 seconds;Right   with over pressure   Levator Stretch  2 reps;30 seconds;Right   with over pressure              PT Short Term Goals - 11/25/19 1734      PT SHORT TERM GOAL #1   Title  pt to be I with inital HEP    Time  3    Period  Weeks    Status  New    Target Date  12/16/19      PT SHORT TERM GOAL #2   Title  pt to report decrease upper trap and surrounding muscle spasm to reduce max pain to </=2/10    Time  3    Period  Weeks    Status  New    Target Date   12/16/19        PT Long Term Goals - 11/25/19 1734      PT LONG TERM GOAL #1   Title  pt to verbalize/ demo efficient posture and lifting mechanics to reduce and prevent neck pain    Time  6    Period  Weeks    Status  New    Target Date  01/20/20      PT LONG TERM GOAL #2   Title  increase FOTO score to </= 33% limited to demo improvement in function    Time  6    Period  Weeks    Status  New    Target Date  01/20/20      PT LONG TERM GOAL #3   Title  pt to report no HA for >/= 1 week to promote improvement in condition and QOL    Time  6    Period  Weeks    Status  New    Target Date  01/20/20      PT LONG TERM GOAL #4   Title  pt to return to personal exercise routine per pt's personal goals to promote QOL    Time  6    Period  Weeks    Status  New    Target Date  01/20/20      PT LONG TERM GOAL #5   Title  pt to be I with all HEP given as of last visit to maintain and progress current level of function    Time  6    Period  Weeks    Status  New    Target Date  01/20/20            Plan - 12/15/19 0835    Clinical Impression Statement  pt reports she is doing better noting only 2/10 pain today. She opted to hold off on TPDN today and since she has [employer] at home, focused on thoracic mobility and posterior shoulder strengthening. She responded well to strengthening today, and report muscle soreness with no pain.    PT  Treatment/Interventions  ADLs/Self Care Home Management;Cryotherapy;Electrical Stimulation;Iontophoresis 4mg /ml Dexamethasone;Moist Heat;Traction;Ultrasound;Therapeutic activities;Therapeutic exercise;Neuromuscular re-education;Manual techniques;Dry needling;Taping;Patient/family education    PT Next Visit Plan  review/ update HEP. provide posture handout, dicuss potential DN, STW along bil upper trap/ levator scapulae, posterior chain strengthening.    PT Home Exercise Plan  [phone] W - chin tuck (supine), upper trap stretch, levator scapulae  stretch, scapular retraction, self rib mobs, money, cervical isometrics    Consulted and Agree with Plan of Care  Patient       Patient will benefit from skilled therapeutic intervention in order to improve the following deficits and impairments:     Visit Diagnosis: Cervicalgia  Abnormal posture  Other muscle spasm     Problem List Patient Active Problem List   Diagnosis Date Noted  . Raynaud phenomenon 09/17/2019  . Hand cramps 09/17/2019  . Carpal tunnel syndrome on left 09/17/2019  . Anxiety 07/30/2019  . Rash and nonspecific skin eruption 07/01/2019  . Musculoskeletal strain 03/25/2019  . Visit for screening mammogram 03/04/2019  . Chronic idiopathic constipation 01/08/2019  . Essential hypertension 10/24/2018  . Psychophysiological insomnia 07/07/2018  . Seasonal allergic rhinitis due to pollen 08/08/2017  . Intractable migraine without aura and without status migrainosus 07/02/2016  . Hyperglycemia 11/28/2015  . Pancreas divisum of native pancreas 12/27/2014  . Routine general medical examination at a health care facility 07/30/2012  . GERD (gastroesophageal reflux disease)   . Hypothyroidism 08/12/2009    Starr Lake PT, DPT, LAT, ATC  12/15/19  8:41 AM       [facility]: [place] [address] [place], [place], [address] Phone: [phone]   Fax:  [phone]  Name: Sharane Deibel MRN: [id number] Date of Birth: [birth year]

## 2019-12-17 ENCOUNTER — Ambulatory Visit: Payer: 59 | Admitting: Physical Therapy

## 2019-12-17 ENCOUNTER — Other Ambulatory Visit: Payer: Self-pay

## 2019-12-17 DIAGNOSIS — R293 Abnormal posture: Secondary | ICD-10-CM

## 2019-12-17 DIAGNOSIS — M62838 Other muscle spasm: Secondary | ICD-10-CM

## 2019-12-17 DIAGNOSIS — M542 Cervicalgia: Secondary | ICD-10-CM | POA: Diagnosis not present

## 2019-12-17 NOTE — Therapy (Signed)
[place], [place], [address] Phone: [phone]   Fax:  [phone]  Physical Therapy Treatment  Patient Details  Name: Chelsey Hunt MRN: [id number] Date of Birth: 1966/02/16 Referring Provider (PT): Pieter Partridge, DO    Encounter Date: 12/17/2019  PT End of Session - 12/17/19 0807    Visit Number  5    Number of Visits  13    Date for PT Re-Evaluation  01/20/20    Authorization Type  [employer]    PT Start Time  0802    PT Stop Time  0851    PT Time Calculation (min)  49 min    Activity Tolerance  Patient tolerated treatment well    Behavior During Therapy  [facility] for tasks assessed/performed       Past Medical History:  Diagnosis Date  . ACID REFLUX DISEASE 01/03/2008  . Anemia   . ANEMIA, IRON DEFICIENCY 12/23/2008   history  . Anxiety   . Chronic tension type headache 01/13/2009   migraines  . Complication of anesthesia    pt states she just needs a small amount or she will sleep too long  . Dysplasia of cervix, low grade (CIN 1)   . Fibroid   . GERD (gastroesophageal reflux disease)   . HEART MURMUR, SYSTOLIC [id number]   will have echo 06/18/2013  . 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 Irritable bowel syndrome 12/23/2008  . Migraine   . Ovarian cyst   . Ovarian cyst   . THYROID NODULE, RIGHT 04/21/2009    Past Surgical History:  Procedure Laterality Date  . ABDOMINAL HYSTERECTOMY    . COLONOSCOPY    . HYSTEROSCOPY    . LAPAROSCOPY    . NOVASURE ABLATION    . POLYPECTOMY    . TUBAL LIGATION    . UPPER GI ENDOSCOPY    . VAGINAL HYSTERECTOMY N/A 06/24/2013   Procedure: TOTAL HYSTERECTOMY VAGINAL;  Surgeon: Alwyn Pea, MD;  Location: [facility];  Service: Gynecology;  Laterality: N/A;    There were no vitals filed for this visit.  Subjective Assessment - 12/17/19 0805    Subjective  "I am doing pretty good, I just got off a shift and I am alittle stiff with no HA"    Diagnostic tests  11/21/2018 Progressive foci of T2 and FLAIR signal within the cerebralhemispheric deep and subcortical white matter, when compared to thestudy of 2010. Most likely diagnosis is progressive small-vesselchange of the white matter. The differential diagnosis does includedemyelinating disease or migraine related foci, but those are feltless likely.   x-ray 04/10/2019 IMPRESSION:1. Reversal of the normal cervical lordosis with a minimalretrolisthesis of C5 on C6.2. Cervical spine spondylosis most notable at C4-C5 and C5-C6.    Currently in Pain?  Yes    Pain Score  0-No pain    Pain Orientation  Right    Pain Descriptors / Indicators  Aching    Pain Type  Chronic pain    Pain Onset  More than a month ago    Pain Frequency  Intermittent         [facility] PT Assessment - 12/17/19 0001      Assessment   Medical Diagnosis  Migraine without aura and without status migrainosus, not intractable G43.009, Occipital neuralgia of right side M54.81, Cervical spondylosis without myelopathy [id number]    Referring Provider (PT)  Pieter Partridge, DO  [facility] Treatment/Exercise - 12/17/19 0001      Neck Exercises: Machines for Strengthening   UBE (Upper Arm Bike)  L2 x 5 min    2:30 FWD/ BWD     Neck Exercises: Seated   Neck Retraction  10 reps;5 secs      Neck Exercises: Supine   Neck Retraction  10 reps;5 secs    Other Supine Exercise  foam roll rountine: ceiling punches, horizontal abd/adduction, alternating ceiling punches, X to Y and back stroke 2 x 10 ea. (cued TRA activation and noted how to palpate)   instead of foam roller used rolled up towels     Lumbar Exercises: Supine   Dead Bug  5 reps   keeping core tight, and tactile cues for form x 10 sec     Modalities   Modalities  Moist Heat      Moist Heat Therapy   Number Minutes Moist Heat  10 Minutes    Moist Heat Location  Cervical   in supine     Manual Therapy   Manual therapy comments   skilled palpation and monitoring of pt throughout TPDN    Soft tissue mobilization  IASTM sub-occipitals and cervical paraspinals      Neck Exercises: Stretches   Upper Trapezius Stretch  2 reps;30 seconds;Right    Levator Stretch  2 reps;30 seconds;Right       Trigger Point Dry Needling - 12/17/19 0001    Consent Given?  Yes    Education Handout Provided  Yes    Muscles Treated Head and Neck  Suboccipitals    Suboccipitals Response  Twitch response elicited   R only          PT Education - 12/17/19 0824    Education Details  updated HEP for foam roll routine with towels. muscle anatomy and what TPDN is /benefits and what to expect.    Person(s) Educated  Patient    Methods  Explanation;Verbal cues;Handout    Comprehension  Verbalized understanding;Verbal cues required       PT Short Term Goals - 11/25/19 1734      PT SHORT TERM GOAL #1   Title  pt to be I with inital HEP    Time  3    Period  Weeks    Status  New    Target Date  12/16/19      PT SHORT TERM GOAL #2   Title  pt to report decrease upper trap and surrounding muscle spasm to reduce max pain to </=2/10    Time  3    Period  Weeks    Status  New    Target Date  12/16/19        PT Long Term Goals - 11/25/19 1734      PT LONG TERM GOAL #1   Title  pt to verbalize/ demo efficient posture and lifting mechanics to reduce and prevent neck pain    Time  6    Period  Weeks    Status  New    Target Date  01/20/20      PT LONG TERM GOAL #2   Title  increase FOTO score to </= 33% limited to demo improvement in function    Time  6    Period  Weeks    Status  New    Target Date  01/20/20      PT LONG TERM GOAL #3   Title  pt to report no  HA for >/= 1 week to promote improvement in condition and QOL    Time  6    Period  Weeks    Status  New    Target Date  01/20/20      PT LONG TERM GOAL #4   Title  pt to return to personal exercise routine per pt's personal goals to promote QOL    Time  6     Period  Weeks    Status  New    Target Date  01/20/20      PT LONG TERM GOAL #5   Title  pt to be I with all HEP given as of last visit to maintain and progress current level of function    Time  6    Period  Weeks    Status  New    Target Date  01/20/20            Plan - 12/17/19 0837    Clinical Impression Statement  Mrs Seegert contineus to progress appropriately with physical therapy noting no pain or HA especially after a full shift at work. educated and consent was provided for TPDN focusing on the R sub-occipitals followed continued working on scapular thoraic mobility and core strengthening. updated HEP for foam roll routine at home. pt reported minimal soreness end of session likely from the TPDN.    PT Treatment/Interventions  ADLs/Self Care Home Management;Cryotherapy;Electrical Stimulation;Iontophoresis 4mg /ml Dexamethasone;Moist Heat;Traction;Ultrasound;Therapeutic activities;Therapeutic exercise;Neuromuscular re-education;Manual techniques;Dry needling;Taping;Patient/family education    PT Next Visit Plan  review/ update HEP. provide posture handout, dicuss potential DN, STW along bil upper trap/ levator scapulae, posterior chain strengthening.    PT Home Exercise Plan  [phone] W - chin tuck (supine), upper trap stretch, levator scapulae stretch, scapular retraction, self rib mobs, money, cervical isometrics, foam roll rountine    Consulted and Agree with Plan of Care  Patient       Patient will benefit from skilled therapeutic intervention in order to improve the following deficits and impairments:  Improper body mechanics, Increased muscle spasms, Decreased strength, Pain  Visit Diagnosis: Cervicalgia  Abnormal posture  Other muscle spasm     Problem List Patient Active Problem List   Diagnosis Date Noted  . Raynaud phenomenon 09/17/2019  . Hand cramps 09/17/2019  . Carpal tunnel syndrome on left 09/17/2019  . Anxiety 07/30/2019  . Rash and nonspecific  skin eruption 07/01/2019  . Musculoskeletal strain 03/25/2019  . Visit for screening mammogram 03/04/2019  . Chronic idiopathic constipation 01/08/2019  . Essential hypertension 10/24/2018  . Psychophysiological insomnia 07/07/2018  . Seasonal allergic rhinitis due to pollen 08/08/2017  . Intractable migraine without aura and without status migrainosus 07/02/2016  . Hyperglycemia 11/28/2015  . Pancreas divisum of native pancreas 12/27/2014  . Routine general medical examination at a health care facility 07/30/2012  . GERD (gastroesophageal reflux disease)   . Hypothyroidism 08/12/2009    Starr Lake PT, DPT, LAT, ATC  12/17/19  8:46 AM      [facility] [address] [place], [place], [address] Phone: [phone]   Fax:  [phone]  Name: Elzena Stoffers MRN: [id number] Date of Birth: [birth year]

## 2019-12-22 ENCOUNTER — Ambulatory Visit: Payer: 59 | Admitting: Physical Therapy

## 2019-12-22 ENCOUNTER — Other Ambulatory Visit: Payer: Self-pay

## 2019-12-22 DIAGNOSIS — R293 Abnormal posture: Secondary | ICD-10-CM

## 2019-12-22 DIAGNOSIS — M62838 Other muscle spasm: Secondary | ICD-10-CM

## 2019-12-22 DIAGNOSIS — M542 Cervicalgia: Secondary | ICD-10-CM
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
## 2019-12-24 ENCOUNTER — Ambulatory Visit: Payer: 59 | Admitting: Physical Therapy

## 2019-12-24 ENCOUNTER — Encounter: Payer: Self-pay | Admitting: Physical Therapy

## 2019-12-24 ENCOUNTER — Other Ambulatory Visit: Payer: Self-pay

## 2019-12-24 DIAGNOSIS — M62838 Other muscle spasm: Secondary | ICD-10-CM

## 2019-12-24 DIAGNOSIS — M542 Cervicalgia: Secondary | ICD-10-CM | POA: Diagnosis not present

## 2019-12-24 DIAGNOSIS — R293 Abnormal posture: Secondary | ICD-10-CM | POA: Diagnosis not present

## 2019-12-24 NOTE — Therapy (Addendum)
[place], [place], [address] Phone: [phone]   Fax:  [phone]  Physical Therapy Treatment  Patient Details  Name: Chelsey Hunt MRN: [id number] Date of Birth: 1966-06-28 Referring Provider (PT): Pieter Partridge, DO    Encounter Date: 12/24/2019  PT End of Session - 12/24/19 0759    Visit Number  7    Number of Visits  13    Date for PT Re-Evaluation  01/20/20    Authorization Type  [employer]    PT Start Time  0800    PT Stop Time  0854    PT Time Calculation (min)  54 min    Activity Tolerance  Patient tolerated treatment well    Behavior During Therapy  [facility] for tasks assessed/performed       Past Medical History:  Diagnosis Date  . ACID REFLUX DISEASE 01/03/2008  . Anemia   . ANEMIA, IRON DEFICIENCY 12/23/2008   history  . Anxiety   . Chronic tension type headache 01/13/2009   migraines  . Complication of anesthesia    pt states she just needs a small amount or she will sleep too long  . Dysplasia of cervix, low grade (CIN 1)   . Fibroid   . GERD (gastroesophageal reflux disease)   . HEART MURMUR, SYSTOLIC [id number]   will have echo 06/18/2013  . 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 Irritable bowel syndrome 12/23/2008  . Migraine   . Ovarian cyst   . Ovarian cyst   . THYROID NODULE, RIGHT 04/21/2009    Past Surgical History:  Procedure Laterality Date  . ABDOMINAL HYSTERECTOMY    . COLONOSCOPY    . HYSTEROSCOPY    . LAPAROSCOPY    . NOVASURE ABLATION    . POLYPECTOMY    . TUBAL LIGATION    . UPPER GI ENDOSCOPY    . VAGINAL HYSTERECTOMY N/A 06/24/2013   Procedure: TOTAL HYSTERECTOMY VAGINAL;  Surgeon: Alwyn Pea, MD;  Location: [facility];  Service: Gynecology;  Laterality: N/A;    There were no vitals filed for this visit.  Subjective Assessment - 12/24/19 0759    Subjective  " I felt good after the last needling session, I would like to try it today. I'm a little  sore and tight." Patient reports 2/10 pain.    Diagnostic tests  11/21/2018 Progressive foci of T2 and FLAIR signal within the cerebralhemispheric deep and subcortical white matter, when compared to thestudy of 2010. Most likely diagnosis is progressive small-vesselchange of the white matter. The differential diagnosis does includedemyelinating disease or migraine related foci, but those are feltless likely.   x-ray 04/10/2019 IMPRESSION:1. Reversal of the normal cervical lordosis with a minimalretrolisthesis of C5 on C6.2. Cervical spine spondylosis most notable at C4-C5 and C5-C6.    Currently in Pain?  Yes    Pain Score  2     Pain Location  Neck    Pain Orientation  Right    Pain Type  Chronic pain    Pain Frequency  Intermittent                       [facility] Adult PT Treatment/Exercise - 12/24/19 0001      Neck Exercises: Machines for Strengthening   UBE (Upper Arm Bike)  L2 x 5 min    reverse at 2:30     Neck Exercises: Seated   Cervical Isometrics  Flexion;Right lateral flexion;Left lateral flexion;5 reps;5 secs  Other Seated Exercise  seated on green physioball protroaction with red theraband    Other Seated Exercise  seated on green physioball scap retraction with red theraband      Neck Exercises: Supine   Neck Retraction  10 reps      Modalities   Modalities  Moist Heat      Moist Heat Therapy   Number Minutes Moist Heat  10 Minutes    Moist Heat Location  Cervical;Shoulder      Manual Therapy   Manual therapy comments  skilled palpation and monitoring of pt throughout TPDN    Soft tissue mobilization  IASTM sub-occipitals and upper traps      Neck Exercises: Stretches   Upper Trapezius Stretch  2 reps;10 seconds;Left;Right    Levator Stretch  Right;Left;2 reps;10 seconds       Trigger Point Dry Needling - 12/24/19 0001    Consent Given?  Yes    Education Handout Provided  Previously provided    Muscles Treated Head and Neck  Suboccipitals;Upper  trapezius    Upper Trapezius Response  Twitch reponse elicited    Suboccipitals Response  Palpable increased muscle length             PT Short Term Goals - 11/25/19 1734      PT SHORT TERM GOAL #1   Title  pt to be I with inital HEP    Time  3    Period  Weeks    Status  New    Target Date  12/16/19      PT SHORT TERM GOAL #2   Title  pt to report decrease upper trap and surrounding muscle spasm to reduce max pain to </=2/10    Time  3    Period  Weeks    Status  New    Target Date  12/16/19        PT Long Term Goals - 11/25/19 1734      PT LONG TERM GOAL #1   Title  pt to verbalize/ demo efficient posture and lifting mechanics to reduce and prevent neck pain    Time  6    Period  Weeks    Status  New    Target Date  01/20/20      PT LONG TERM GOAL #2   Title  increase FOTO score to </= 33% limited to demo improvement in function    Time  6    Period  Weeks    Status  New    Target Date  01/20/20      PT LONG TERM GOAL #3   Title  pt to report no HA for >/= 1 week to promote improvement in condition and QOL    Time  6    Period  Weeks    Status  New    Target Date  01/20/20      PT LONG TERM GOAL #4   Title  pt to return to personal exercise routine per pt's personal goals to promote QOL    Time  6    Period  Weeks    Status  New    Target Date  01/20/20      PT LONG TERM GOAL #5   Title  pt to be I with all HEP given as of last visit to maintain and progress current level of function    Time  6    Period  Weeks    Status  New  Target Date  01/20/20            Plan - 12/24/19 0849    Clinical Impression Statement  Patient presents to the clinic with neck pain. She reports that she is mostly having stiffness after working in the yard with her husband. She had some twich responses when the upper trap and suboccipitals were needled. Patient reported that she felt better after doing some exercises. She tolerated exercises well, and noted that  she understood that she would be sore for 24-48 hours due to needling. She demonstrates improvements in core strength. She would benefit from PT in order to further address pain and strength deficits.    Personal Factors and Comorbidities  Comorbidity 2    Comorbidities  hx of anemia, anxiety    Stability/Clinical Decision Making  Evolving/Moderate complexity    Clinical Decision Making  Moderate    Rehab Potential  Good    PT Frequency  2x / week    PT Duration  6 weeks    PT Treatment/Interventions  ADLs/Self Care Home Management;Cryotherapy;Electrical Stimulation;Iontophoresis 4mg /ml Dexamethasone;Moist Heat;Traction;Ultrasound;Therapeutic activities;Therapeutic exercise;Neuromuscular re-education;Manual techniques;Dry needling;Taping;Patient/family education    PT Next Visit Plan  Assess response to DN, Continue core strengthening, Cervical mm. strengthening    PT Home Exercise Plan  [id number] W - chin tuck (supine), upper trap stretch, levator scapulae stretch, scapular retraction, self rib mobs, money, cervical isometrics, foam roll rountine    Consulted and Agree with Plan of Care  Patient       Patient will benefit from skilled therapeutic intervention in order to improve the following deficits and impairments:  Improper body mechanics, Increased muscle spasms, Decreased strength, Pain  Visit Diagnosis: Cervicalgia  Abnormal posture  Other muscle spasm     Problem List Patient Active Problem List   Diagnosis Date Noted  . Raynaud phenomenon 09/17/2019  . Hand cramps 09/17/2019  . Carpal tunnel syndrome on left 09/17/2019  . Anxiety 07/30/2019  . Rash and nonspecific skin eruption 07/01/2019  . Musculoskeletal strain 03/25/2019  . Visit for screening mammogram 03/04/2019  . Chronic idiopathic constipation 01/08/2019  . Essential hypertension 10/24/2018  . Psychophysiological insomnia 07/07/2018  . Seasonal allergic rhinitis due to pollen 08/08/2017  . Intractable migraine  without aura and without status migrainosus 07/02/2016  . Hyperglycemia 11/28/2015  . Pancreas divisum of native pancreas 12/27/2014  . Routine general medical examination at a health care facility 07/30/2012  . GERD (gastroesophageal reflux disease)   . Hypothyroidism 08/12/2009    Laveda Norman, SPT 12/24/2019, 9:08 AM  [facility] [address] [place], [place], [address] Phone: [phone]   Fax:  [phone]  Name: Shaelynn Burkemper MRN: [id number] Date of Birth: [birth year]

## 2019-12-29 ENCOUNTER — Other Ambulatory Visit: Payer: Self-pay

## 2019-12-29 ENCOUNTER — Ambulatory Visit: Payer: 59 | Admitting: Physical Therapy

## 2019-12-29 ENCOUNTER — Encounter: Payer: Self-pay | Admitting: Physical Therapy

## 2019-12-29 DIAGNOSIS — R293 Abnormal posture: Secondary | ICD-10-CM | POA: Diagnosis not present

## 2019-12-29 DIAGNOSIS — M542 Cervicalgia: Secondary | ICD-10-CM

## 2019-12-29 DIAGNOSIS — M62838 Other muscle spasm: Secondary | ICD-10-CM

## 2019-12-29 NOTE — Therapy (Signed)
[place], [place], [address] Phone: [phone]   Fax:  [phone]  Physical Therapy Treatment  Patient Details  Name: Chelsey Hunt MRN: [id number] Date of Birth: 03/27/1966 Referring Provider (PT): Pieter Partridge, DO    Encounter Date: 12/29/2019  PT End of Session - 12/29/19 0824    Visit Number  8    Number of Visits  13    Date for PT Re-Evaluation  01/20/20    Authorization Type  [facility]    PT Start Time  0800    PT Stop Time  0840    PT Time Calculation (min)  40 min    Activity Tolerance  Patient tolerated treatment well    Behavior During Therapy  [facility] for tasks assessed/performed       Past Medical History:  Diagnosis Date  . ACID REFLUX DISEASE 01/03/2008  . Anemia   . ANEMIA, IRON DEFICIENCY 12/23/2008   history  . Anxiety   . Chronic tension type headache 01/13/2009   migraines  . Complication of anesthesia    pt states she just needs a small amount or she will sleep too long  . Dysplasia of cervix, low grade (CIN 1)   . Fibroid   . GERD (gastroesophageal reflux disease)   . HEART MURMUR, SYSTOLIC [id number]   will have echo 06/18/2013  . 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 Irritable bowel syndrome 12/23/2008  . Migraine   . Ovarian cyst   . Ovarian cyst   . THYROID NODULE, RIGHT 04/21/2009    Past Surgical History:  Procedure Laterality Date  . ABDOMINAL HYSTERECTOMY    . COLONOSCOPY    . HYSTEROSCOPY    . LAPAROSCOPY    . NOVASURE ABLATION    . POLYPECTOMY    . TUBAL LIGATION    . UPPER GI ENDOSCOPY    . VAGINAL HYSTERECTOMY N/A 06/24/2013   Procedure: TOTAL HYSTERECTOMY VAGINAL;  Surgeon: Alwyn Pea, MD;  Location: [facility];  Service: Gynecology;  Laterality: N/A;    There were no vitals filed for this visit.  Subjective Assessment - 12/29/19 0802    Subjective  "I noticed I could move better with the DN last session. I am doing much better with the HA  and I do the exercises when I feel it coming and it helps"    Patient Stated Goals  getting back to exercise    Currently in Pain?  Yes    Pain Score  0-No pain   reports only stiffness   Pain Location  Neck    Pain Orientation  Right    Pain Descriptors / Indicators  Tightness    Pain Onset  More than a month ago    Pain Frequency  Once a week    Aggravating Factors   working/ stress    Pain Relieving Factors  exercies and stretching.         [facility] PT Assessment - 12/29/19 0001      Assessment   Medical Diagnosis  Migraine without aura and without status migrainosus, not intractable G43.009, Occipital neuralgia of right side M54.81, Cervical spondylosis without myelopathy [id number]    Referring Provider (PT)  Pieter Partridge, DO                    [facility] Adult PT Treatment/Exercise - 12/29/19 0001      Neck Exercises: Machines for Strengthening   UBE (Upper Arm Bike)  L5  x 6 min    fwd/bwd x 3 min     Neck Exercises: Supine   Cervical Isometrics  Right lateral flexion;Left lateral flexion;10 secs;10 reps   in R / L sidelying   Neck Retraction  10 reps;10 secs   with chin tuck head lift   Other Supine Exercise  dead bug 1 x 5 holding 10 seconds    Other Supine Exercise  supine scapular series: bil shoulder protraction 2 x 15, scap retraction with ER 2 x 15, horizontal abduction 2 x 10, bil shoulder flexion/ extension with sutained shoulder abduction to mid range 2 x 10, PNF D2 bil with green theraband 2 x 10 ea.      Neck Exercises: Stretches   Upper Trapezius Stretch  2 reps;Left;Right;30 seconds    Levator Stretch  2 reps;Left;Right;30 seconds               PT Short Term Goals - 12/29/19 [id number]      PT SHORT TERM GOAL #1   Title  pt to be I with inital HEP    Period  Weeks    Status  Achieved      PT SHORT TERM GOAL #2   Title  pt to report decrease upper trap and surrounding muscle spasm to reduce max pain to </=2/10    Period  Weeks    Status   Achieved        PT Long Term Goals - 12/29/19 [id number]      PT LONG TERM GOAL #1   Title  pt to verbalize/ demo efficient posture and lifting mechanics to reduce and prevent neck pain    Status  On-going      PT LONG TERM GOAL #2   Title  increase FOTO score to </= 33% limited to demo improvement in function    Period  Weeks    Status  Unable to assess      PT LONG TERM GOAL #3   Title  pt to report no HA for >/= 1 week to promote improvement in condition and QOL    Period  Weeks    Status  On-going      PT LONG TERM GOAL #4   Title  pt to return to personal exercise routine per pt's personal goals to promote QOL    Period  Weeks    Status  On-going      PT LONG TERM GOAL #5   Title  pt to be I with all HEP given as of last visit to maintain and progress current level of function    Period  Weeks    Status  On-going            Plan - 12/29/19 0819    Clinical Impression Statement  [name] arrives to session following working a full shift noting no pain but reports only stiffness. Focused session on posterior shoulder and scapular stabilizing exercises which she did well with. plan to see how pt responds to session and if she continues to do well then next session plan to update HEP and discharge.    PT Treatment/Interventions  ADLs/Self Care Home Management;Cryotherapy;Electrical Stimulation;Iontophoresis 4mg /ml Dexamethasone;Moist Heat;Traction;Ultrasound;Therapeutic activities;Therapeutic exercise;Neuromuscular re-education;Manual techniques;Dry needling;Taping;Patient/family education    PT Next Visit Plan  reviewContinue core strengthening, Cervical mm. strengthening    PT Home Exercise Plan  [id number] W - chin tuck (supine), upper trap stretch, levator scapulae stretch, scapular retraction, self rib mobs, money, cervical isometrics, foam roll rountine  Consulted and Agree with Plan of Care  Patient       Patient will benefit from skilled therapeutic intervention in  order to improve the following deficits and impairments:     Visit Diagnosis: Cervicalgia  Abnormal posture  Other muscle spasm     Problem List Patient Active Problem List   Diagnosis Date Noted  . Raynaud phenomenon 09/17/2019  . Hand cramps 09/17/2019  . Carpal tunnel syndrome on left 09/17/2019  . Anxiety 07/30/2019  . Rash and nonspecific skin eruption 07/01/2019  . Musculoskeletal strain 03/25/2019  . Visit for screening mammogram 03/04/2019  . Chronic idiopathic constipation 01/08/2019  . Essential hypertension 10/24/2018  . Psychophysiological insomnia 07/07/2018  . Seasonal allergic rhinitis due to pollen 08/08/2017  . Intractable migraine without aura and without status migrainosus 07/02/2016  . Hyperglycemia 11/28/2015  . Pancreas divisum of native pancreas 12/27/2014  . Routine general medical examination at a health care facility 07/30/2012  . GERD (gastroesophageal reflux disease)   . Hypothyroidism 08/12/2009   Starr Lake PT, DPT, LAT, ATC  12/29/19  8:42 AM      [facility] [address] [place], [place], [address] Phone: [phone]   Fax:  [phone]  Name: Chelsey Hunt MRN: [id number] Date of Birth: 02-Oct-1965

## 2019-12-31 ENCOUNTER — Ambulatory Visit: Payer: 59 | Admitting: Physical Therapy

## 2019-12-31 ENCOUNTER — Other Ambulatory Visit: Payer: Self-pay

## 2019-12-31 ENCOUNTER — Encounter: Payer: Self-pay | Admitting: Physical Therapy

## 2019-12-31 DIAGNOSIS — M62838 Other muscle spasm: Secondary | ICD-10-CM

## 2019-12-31 DIAGNOSIS — R293 Abnormal posture: Secondary | ICD-10-CM | POA: Diagnosis not present

## 2019-12-31 DIAGNOSIS — M542 Cervicalgia: Secondary | ICD-10-CM

## 2019-12-31 NOTE — Therapy (Signed)
[place], [place], [address] Phone: [phone]   Fax:  [phone]  Physical Therapy Treatment / Discharge  Patient Details  Name: Chelsey Hunt MRN: [id number] Date of Birth: 11/16/1965 Referring Provider (PT): Pieter Partridge, DO    Encounter Date: 12/31/2019  PT End of Session - 12/31/19 0806    Visit Number  9    Number of Visits  13    Date for PT Re-Evaluation  01/20/20    Authorization Type  [facility] [employer]    PT Start Time  0801    PT Stop Time  0847    PT Time Calculation (min)  46 min    Activity Tolerance  Patient tolerated treatment well    Behavior During Therapy  [facility] for tasks assessed/performed       Past Medical History:  Diagnosis Date  . ACID REFLUX DISEASE 01/03/2008  . Anemia   . ANEMIA, IRON DEFICIENCY 12/23/2008   history  . Anxiety   . Chronic tension type headache 01/13/2009   migraines  . Complication of anesthesia    pt states she just needs a small amount or she will sleep too long  . Dysplasia of cervix, low grade (CIN 1)   . Fibroid   . GERD (gastroesophageal reflux disease)   . HEART MURMUR, SYSTOLIC 62/26/3335   will have echo 06/18/2013  . 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 Irritable bowel syndrome 12/23/2008  . Migraine   . Ovarian cyst   . Ovarian cyst   . THYROID NODULE, RIGHT 04/21/2009    Past Surgical History:  Procedure Laterality Date  . ABDOMINAL HYSTERECTOMY    . COLONOSCOPY    . HYSTEROSCOPY    . LAPAROSCOPY    . NOVASURE ABLATION    . POLYPECTOMY    . TUBAL LIGATION    . UPPER GI ENDOSCOPY    . VAGINAL HYSTERECTOMY N/A 06/24/2013   Procedure: TOTAL HYSTERECTOMY VAGINAL;  Surgeon: Alwyn Pea, MD;  Location: [facility];  Service: Gynecology;  Laterality: N/A;    There were no vitals filed for this visit.  Subjective Assessment - 12/31/19 0803    Subjective  "I'm good today, but my migraine is a 3. I woke up with a headache. I was doing  good before I went to bed. I sleep on my shoulders."    Diagnostic tests  11/21/2018 Progressive foci of T2 and FLAIR signal within the cerebralhemispheric deep and subcortical white matter, when compared to thestudy of 2010. Most likely diagnosis is progressive small-vesselchange of the white matter. The differential diagnosis does includedemyelinating disease or migraine related foci, but those are feltless likely.   x-ray 04/10/2019 IMPRESSION:1. Reversal of the normal cervical lordosis with a minimalretrolisthesis of C5 on C6.2. Cervical spine spondylosis most notable at C4-C5 and C5-C6.    Currently in Pain?  Yes    Pain Score  3     Pain Location  Neck    Pain Orientation  Right    Pain Descriptors / Indicators  Tightness    Pain Type  Chronic pain    Pain Onset  More than a month ago    Pain Frequency  Once a week    Aggravating Factors   working/stress    Pain Relieving Factors  exercise and stretching         [facility] PT Assessment - 12/31/19 0001      Assessment   Medical Diagnosis  Migraine without aura and without  status migrainosus, not intractable G43.009, Occipital neuralgia of right side M54.81, Cervical spondylosis without myelopathy M47.812    Referring Provider (PT)  Pieter Partridge, DO       Observation/Other Assessments   Focus on Therapeutic Outcomes (FOTO)   37% limited       AROM   Cervical Flexion  40   increased stiffness at end range, initally 44 with pain   Cervical Extension  50   inital ROM 42   Cervical - Right Side Bend  40   inital score 30   Cervical - Left Side Bend  40   inital score 38   Cervical - Right Rotation  70   inital measure 63   Cervical - Left Rotation  60   initial measure 63     Strength   Right Shoulder Flexion  4+/5    Right Shoulder Extension  5/5    Right Shoulder ABduction  4/5    Right Shoulder Internal Rotation  5/5    Right Shoulder External Rotation  5/5    Left Shoulder Flexion  4+/5    Left Shoulder Extension  5/5     Left Shoulder ABduction  4/5    Left Shoulder Internal Rotation  5/5    Left Shoulder External Rotation  5/5                   [facility] Adult PT Treatment/Exercise - 12/31/19 0001      Neck Exercises: Machines for Strengthening   UBE (Upper Arm Bike)  L5 x 5 min       Neck Exercises: Supine   Cervical Isometrics  Right lateral flexion;Left lateral flexion;10 secs;10 reps    Other Supine Exercise  Chin tuck holds, 5 reps for 20 sec       Neck Exercises: Prone   Other Prone Exercise  I's and T's 2 x 10   first set of I's 2#, second set 1#     Neck Exercises: Stretches   Upper Trapezius Stretch  2 reps;Left;Right;30 seconds   with over pressure   Levator Stretch  2 reps;Left;Right;30 seconds   with overpressure            PT Education - 12/31/19 0834    Education Details  reviewed sleeping positing and benefits of neutral positioning of the neck. Updated HEP and reviewed how to progress exercise with reps/ sets to promote endurance. reviewed pt functional progress and FOTO survey    Person(s) Educated  Patient    Methods  Explanation;Verbal cues    Comprehension  Verbal cues required;Verbalized understanding       PT Short Term Goals - 12/29/19 2800      PT SHORT TERM GOAL #1   Title  pt to be I with inital HEP    Period  Weeks    Status  Achieved      PT SHORT TERM GOAL #2   Title  pt to report decrease upper trap and surrounding muscle spasm to reduce max pain to </=2/10    Period  Weeks    Status  Achieved        PT Long Term Goals - 12/31/19 3491      PT LONG TERM GOAL #1   Title  pt to verbalize/ demo efficient posture and lifting mechanics to reduce and prevent neck pain    Status  Achieved      PT LONG TERM GOAL #2   Title  increase FOTO  score to </= 33% limited to demo improvement in function    Period  Weeks    Status  Partially Met      PT LONG TERM GOAL #3   Title  pt to report no HA for >/= 1 week to promote improvement in condition  and QOL    Period  Weeks    Status  Achieved      PT LONG TERM GOAL #4   Title  pt to return to personal exercise routine per pt's personal goals to promote QOL    Period  Weeks    Status  On-going      PT LONG TERM GOAL #5   Title  pt to be I with all HEP given as of last visit to maintain and progress current level of function    Period  Weeks    Status  Achieved            Plan - 12/31/19 0843    Clinical Impression Statement  Patient presents to the clinic with a 3/10 pain. SHe reports that she slept wrong and woke up with a HA. Her FOTO score has improved compared to initial assessment. Upon further assessment, Pt. has made great improvements with many of ROM and strength measurements. Patient reported a 1/10 pain after the session. She reports that she has improved a lot since starting PT. We agreed that she has the tools needed to be successful outside of physical therapy, and will be discharged today.    Personal Factors and Comorbidities  Comorbidity 2    Comorbidities  hx of anemia, anxiety    Stability/Clinical Decision Making  Evolving/Moderate complexity    Clinical Decision Making  Moderate    Rehab Potential  Good    PT Frequency  2x / week    PT Duration  6 weeks    PT Treatment/Interventions  ADLs/Self Care Home Management;Cryotherapy;Electrical Stimulation;Iontophoresis 48m/ml Dexamethasone;Moist Heat;Traction;Ultrasound;Therapeutic activities;Therapeutic exercise;Neuromuscular re-education;Manual techniques;Dry needling;Taping;Patient/family education    PT Next Visit Plan  Discharge today    PT Home Exercise Plan  [phone] - ), upper trap stretch, levator scapulae stretch, scapular retraction, self rib mobs, money, cervical isometrics, foam roll rountine, Chin tuck head lift, prone I's, T's and Y's, dead bug    Consulted and Agree with Plan of Care  Patient       Patient will benefit from skilled therapeutic intervention in order to improve the following  deficits and impairments:  Improper body mechanics, Increased muscle spasms, Decreased strength, Pain  Visit Diagnosis: Cervicalgia  Abnormal posture  Other muscle spasm     Problem List Patient Active Problem List   Diagnosis Date Noted  . Raynaud phenomenon 09/17/2019  . Hand cramps 09/17/2019  . Carpal tunnel syndrome on left 09/17/2019  . Anxiety 07/30/2019  . Rash and nonspecific skin eruption 07/01/2019  . Musculoskeletal strain 03/25/2019  . Visit for screening mammogram 03/04/2019  . Chronic idiopathic constipation 01/08/2019  . Essential hypertension 10/24/2018  . Psychophysiological insomnia 07/07/2018  . Seasonal allergic rhinitis due to pollen 08/08/2017  . Intractable migraine without aura and without status migrainosus 07/02/2016  . Hyperglycemia 11/28/2015  . Pancreas divisum of native pancreas 12/27/2014  . Routine general medical examination at a health care facility 07/30/2012  . GERD (gastroesophageal reflux disease)   . Hypothyroidism 08/12/2009    LStarr Lake4/22/2021, 8:57 AM  [facility] [address]Phone: [phone]  Fax:  [phone]  Name: Chelsey Hunt MRN: [id number] Date of Birth: 06/01/1966     PHYSICAL THERAPY DISCHARGE SUMMARY  Visits from Start of Care: 9  Current functional level related to goals / functional outcomes: See goals, FOTO 37% limited   Remaining deficits: Intermittent Headache with associated stiffness in the neck that reduces with exercise and stretching.    Education / Equipment: HEP, theraband, posture, anatomy  Plan: Patient agrees to discharge.  Patient goals were partially met. Patient is being discharged due to being pleased with the current functional level.  ?????         Senaya Dicenso PT, DPT, LAT, ATC  12/31/19  8:59 AM

## 2020-01-05 ENCOUNTER — Ambulatory Visit: Payer: 59 | Admitting: Family

## 2020-01-05 ENCOUNTER — Encounter: Payer: Self-pay | Admitting: Family

## 2020-01-05 ENCOUNTER — Other Ambulatory Visit: Payer: Self-pay

## 2020-01-05 ENCOUNTER — Other Ambulatory Visit: Payer: Self-pay | Admitting: Family

## 2020-01-05 VITALS — BP 138/88 | HR 87 | Temp 98.6°F | Wt 135.2 lb

## 2020-01-05 DIAGNOSIS — E039 Hypothyroidism, unspecified: Secondary | ICD-10-CM

## 2020-01-05 DIAGNOSIS — R5383 Other fatigue: Secondary | ICD-10-CM | POA: Diagnosis not present

## 2020-01-05 DIAGNOSIS — R030 Elevated blood-pressure reading, without diagnosis of hypertension: Secondary | ICD-10-CM | POA: Diagnosis not present

## 2020-01-05 LAB — COMPREHENSIVE METABOLIC PANEL
ALT: 15 U/L (ref 0–35)
AST: 16 U/L (ref 0–37)
Albumin: 4.3 g/dL (ref 3.5–5.2)
Alkaline Phosphatase: 94 U/L (ref 39–117)
BUN: 12 mg/dL (ref 6–23)
CO2: 31 mEq/L (ref 19–32)
Calcium: 10.1 mg/dL (ref 8.4–10.5)
Chloride: 104 mEq/L (ref 96–112)
Creatinine, Ser: 0.99 mg/dL (ref 0.40–1.20)
GFR: 70.57 mL/min (ref >60.00)
Glucose, Bld: 91 mg/dL (ref 70–99)
Potassium: 4.1 mEq/L (ref 3.5–5.1)
Sodium: 139 mEq/L (ref 135–145)
Total Bilirubin: 0.3 mg/dL (ref 0.2–1.2)
Total Protein: 7.7 g/dL (ref 6.0–8.3)

## 2020-01-05 LAB — CBC WITH DIFFERENTIAL/PLATELET
Basophils Absolute: 0 10*3/uL (ref 0.0–0.1)
Basophils Relative: 0.5 % (ref 0.0–3.0)
Eosinophils Absolute: 0.1 10*3/uL (ref 0.0–0.7)
Eosinophils Relative: 2.1 % (ref 0.0–5.0)
HCT: 40.1 % (ref 36.0–46.0)
Hemoglobin: 13.3 g/dL (ref 12.0–15.0)
Lymphocytes Relative: 42.3 % (ref 12.0–46.0)
Lymphs Abs: 2 10*3/uL (ref 0.7–4.0)
MCHC: 33.2 g/dL (ref 30.0–36.0)
MCV: 93.2 fl (ref 78.0–100.0)
Monocytes Absolute: 0.3 10*3/uL (ref 0.1–1.0)
Monocytes Relative: 5.5 % (ref 3.0–12.0)
Neutro Abs: 2.3 10*3/uL (ref 1.4–7.7)
Neutrophils Relative %: 49.6 % (ref 43.0–77.0)
Platelets: 385 10*3/uL (ref 150.0–400.0)
RBC: 4.3 Mil/uL (ref 3.87–5.11)
RDW: 13.7 % (ref 11.5–15.5)
WBC: 4.7 10*3/uL (ref 4.0–10.5)

## 2020-01-05 LAB — TSH: TSH: 3.61 u[IU]/mL (ref 0.35–4.50)

## 2020-01-05 NOTE — Progress Notes (Signed)
Chelsey Hunt is a 54 y.o. female with the following history as recorded in EpicCare:  Patient Active Problem List   Diagnosis Date Noted  . Raynaud phenomenon 09/17/2019  . Hand cramps 09/17/2019  . Carpal tunnel syndrome on left 09/17/2019  . Anxiety 07/30/2019  . Rash and nonspecific skin eruption 07/01/2019  . Musculoskeletal strain 03/25/2019  . Visit for screening mammogram 03/04/2019  . Chronic idiopathic constipation 01/08/2019  . Essential hypertension 10/24/2018  . Psychophysiological insomnia 07/07/2018  . Seasonal allergic rhinitis due to pollen 08/08/2017  . Intractable migraine without aura and without status migrainosus 07/02/2016  . Hyperglycemia 11/28/2015  . Pancreas divisum of native pancreas 12/27/2014  . Routine general medical examination at a health care facility 07/30/2012  . GERD (gastroesophageal reflux disease)   . Hypothyroidism 08/12/2009    Current Outpatient Medications  Medication Sig Dispense Refill  . acetaminophen (TYLENOL) 325 MG tablet Take 650 mg by mouth every 6 (six) hours as needed for mild pain.    Marland Kitchen co-enzyme Q-10 30 MG capsule Take 200 mg by mouth 3 (three) times daily.    . cyclobenzaprine (FLEXERIL) 5 MG tablet Take 1 tablet (5 mg total) by mouth 3 (three) times daily as needed for muscle spasms. 30 tablet 1  . Fremanezumab-vfrm (AJOVY) 225 MG/1.5ML SOAJ Inject 225 mg into the skin every 30 (thirty) days. 1 pen 11  . gabapentin (NEURONTIN) 100 MG capsule Take 1 capsule (100 mg total) by mouth at bedtime. 30 capsule 5  . ibuprofen (ADVIL,MOTRIN) 200 MG tablet Take 200 mg by mouth every 8 (eight) hours as needed.    . pantoprazole (PROTONIX) 40 MG tablet Take 40 mg by mouth 2 (two) times daily.    . promethazine (PHENERGAN) 25 MG tablet Take 1 tablet (25 mg total) by mouth every 6 (six) hours as needed for nausea or vomiting. 30 tablet 5  . Rimegepant Sulfate (NURTEC) 75 MG TBDP Take by mouth as needed.    Marland Kitchen SYNTHROID 75 MCG tablet  TAKE 1 TABLET (75 MCG TOTAL) BY MOUTH DAILY. 90 tablet 0   No current facility-administered medications for this visit.    Allergies: Imitrex [sumatriptan], Propoxyphene n-acetaminophen, Nabumetone, and Topiramate  Past Medical History:  Diagnosis Date  . ACID REFLUX DISEASE 01/03/2008  . Anemia   . ANEMIA, IRON DEFICIENCY 12/23/2008   history  . Anxiety   . Chronic tension type headache 01/13/2009   migraines  . Complication of anesthesia    pt states she just needs a small amount or she will sleep too long  . Dysplasia of cervix, low grade (CIN 1)   . Fibroid   . GERD (gastroesophageal reflux disease)   . HEART MURMUR, SYSTOLIC 62/70/3500   will have echo 06/18/2013  . 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 Irritable bowel syndrome 12/23/2008  . Migraine   . Ovarian cyst   . Ovarian cyst   . THYROID NODULE, RIGHT 04/21/2009    Past Surgical History:  Procedure Laterality Date  . ABDOMINAL HYSTERECTOMY    . COLONOSCOPY    . HYSTEROSCOPY    . LAPAROSCOPY    . NOVASURE ABLATION    . POLYPECTOMY    . TUBAL LIGATION    . UPPER GI ENDOSCOPY    . VAGINAL HYSTERECTOMY N/A 06/24/2013   Procedure: TOTAL HYSTERECTOMY VAGINAL;  Surgeon: Alwyn Pea, MD;  Location: [facility];  Service: Gynecology;  Laterality: N/A;    Family History  Problem Relation Age of Onset  . Goiter  Mother   . Heart disease Mother   . Thyroid disease Mother   . Heart attack Mother   . Graves' disease Sister   . Asthma Sister   . Cancer Father   . Heart disease Father   . Heart attack Father   . CVA Brother   . Hypertension Brother   . Hypertension Brother   . Alcoholism Brother   . Stomach cancer Sister   . Hepatitis Sister   . Stomach cancer Sister   . Goiter Other        Siblings 2  . Hypertension Paternal Grandfather   . Depression Neg Hx   . Alcohol abuse Neg Hx   . Drug abuse Neg Hx   . Early death Neg Hx   . Hearing loss Neg Hx   . Kidney disease Neg Hx   . Stroke Neg Hx      Social History   Tobacco Use  . Smoking status: Never Smoker  . Smokeless tobacco: Never Used  Substance Use Topics  . Alcohol use: No    Comment: occ    Subjective:   Patient notes that yesterday she was having sensation of "tingling" in right side of head- had episode of double vision while trying to look at the computer- lasted for about 5 minutes. Notes that she had not eaten very much yesterday and by the time she got home last night- blood pressure was up slightly 147/93; felt like her heartrate was up slightly- at 101; Opted to call out from work last night and rest; feeling better today; Notes that symptoms seemed different than her "normal migraine." Admits that stress level does seem quite a bit higher recently; opted not to take Celexa- did not want to be on another medication.  Objective:  Vitals:   01/05/20 1134  BP: 138/88  Pulse: 87  Temp: 98.6 F (37 C)  TempSrc: Oral  SpO2: 99%  Weight: 135 lb 3.2 oz (61.3 kg)    General: Well developed, well nourished, in no acute distress  Skin : Warm and dry.  Head: Normocephalic and atraumatic  Eyes: Sclera and conjunctiva clear; pupils round and reactive to light; extraocular movements intact  Ears: External normal; canals clear; tympanic membranes normal  Oropharynx: Pink, supple. No suspicious lesions  Neck: Supple without thyromegaly, adenopathy; no carotid bruits noted Lungs: Respirations unlabored; clear to auscultation bilaterally without wheeze, rales, rhonchi  CVS exam: normal rate and regular rhythm.  Neurologic: Alert and oriented; speech intact; face symmetrical; moves all extremities well; CNII-XII intact without focal deficit   Assessment:  1. Elevated blood pressure reading   2. Other fatigue   3. Acquired hypothyroidism     Plan:  ? If yesterday's symptoms were atypical migraine presentation; physical exam today is reassuring; EKG shows NSR; pulse rate is normal; will update labs today and assuming all  are normal, will order MRI of brain; if symptoms persist, she needs to see her neurologist.  This visit occurred during the SARS-CoV-2 public health emergency.  Safety protocols were in place, including screening questions prior to the visit, additional usage of staff PPE, and extensive cleaning of exam room while observing appropriate contact time as indicated for disinfecting solutions.     No follow-ups on file.  Orders Placed This Encounter  Procedures  . CBC with Differential/Platelet  . Comp Met (CMET)  . TSH  . EKG 12-Lead    Requested Prescriptions    No prescriptions requested or ordered in this encounter

## 2020-01-06 ENCOUNTER — Other Ambulatory Visit: Payer: Self-pay | Admitting: Family

## 2020-01-06 ENCOUNTER — Encounter: Payer: Self-pay | Admitting: Family

## 2020-01-06 DIAGNOSIS — R29818 Other symptoms and signs involving the nervous system: Secondary | ICD-10-CM

## 2020-01-06 MED ORDER — DIAZEPAM 5 MG PO TABS
ORAL_TABLET | ORAL | 0 refills | Status: DC
Start: 2020-01-06 — End: 2020-02-22

## 2020-01-06 MED FILL — diazePAM 5 MG TABS: 5 | 1 days supply | Qty: 1 | Fill #0

## 2020-01-14 MED FILL — AJOVY 225 MG/1.5ML SOAJ: 225 | 30 days supply | Qty: 2 | Fill #8

## 2020-01-14 MED FILL — NURTEC 75 MG TBDP: 75 | 30 days supply | Qty: 8 | Fill #1

## 2020-02-09 ENCOUNTER — Ambulatory Visit
Admission: RE | Admit: 2020-02-09 | Discharge: 2020-02-09 | Disposition: A | Discharge status: Home / Self Care | Payer: 59 | Source: Ambulatory Visit | Attending: Family | Admitting: Family

## 2020-02-09 ENCOUNTER — Other Ambulatory Visit: Payer: Self-pay

## 2020-02-09 DIAGNOSIS — R519 Headache, unspecified: Secondary | ICD-10-CM | POA: Diagnosis not present

## 2020-02-09 DIAGNOSIS — R29818 Other symptoms and signs involving the nervous system: Secondary | ICD-10-CM

## 2020-02-09 MED ORDER — GADOBENATE DIMEGLUMINE 529 MG/ML IV SOLN
12.0000 mL | Freq: Once | INTRAVENOUS | Status: AC | PRN
  Administered 2020-02-09: 12 mL via INTRAVENOUS

## 2020-02-16 ENCOUNTER — Other Ambulatory Visit: Payer: Self-pay | Admitting: Neurology

## 2020-02-16 ENCOUNTER — Other Ambulatory Visit: Payer: Self-pay | Admitting: Family

## 2020-02-16 DIAGNOSIS — E039 Hypothyroidism, unspecified: Secondary | ICD-10-CM

## 2020-02-18 NOTE — Progress Notes (Signed)
NEUROLOGY FOLLOW UP OFFICE NOTE  Belky Mundo [id number]  HISTORY OF PRESENT ILLNESS: Chelsey Hunt is a 54year old right-handed woman with hypothyroidism who follows up for migraines.  UPDATE: For burning scalp neuralgia, she was started on gabapentin.  Couldn't tolerate it.  Now only takes gabapentin 100mg  PRN when neuralgia acts up  They are severe and last a day and a half.  Nurtec is not helpful (it eases the pain but not resolves.  Takes Tylenol at least 3 days a week (only 1 at a time).  They occur at least 10 days a month.  She now reports a visual aura during the headache, described as a hair floating in her vision. Her PCP ordered another MRI of brain with and without contrast performed on 02/10/2020, which was personally reviewed and again demonstrated non-enhancing white matter T2 and FLAIR signal in the cerebral hemispheres, stable compared to prior imaging from last year.  Current NSAIDS: none Current analgesics: Tylenol Current triptans:None Current ergotamine: None Current abortive anti-CGRP:Nurtec Current anti-emetic: Promethazine Current muscle relaxants: Cyclobenzaprine 10 mg Current anti-anxiolytic: None Current sleep aide: None Current Antihypertensive medications: None Current Antidepressant medications: none Current Anticonvulsant medications: gabapentin 100mg  QHS (for scalp neuralgia; cannot tolerate higher doses) Current anti-CGRP:Ajovy, Nurtec Current Vitamins/Herbal/Supplements:Co Q-10; turmeric Current Antihistamines/Decongestants: None Other therapy: Physical therapy for neck pain; dry needling Hormone/birth control: None  Caffeine: No Diet: 5-6 bottles of water daily Exercise: Not routine Depression: No; Anxiety: Yes Other pain:neck pain. She has cervical spondylosis Sleep hygiene: Poor  HISTORY: Onset:First migraine was between54 and 54 years old. Migraine free until 2004 during her menstrual cycle. Chronic for many  years. Location:Left more than right. Quality:throbbing Initial intensity:Severe.  Aura:no Prodrome:no Postdrome:fatigue Associated symptoms:Right facial tingling,nausea, photophobia, phonophobia, osmophobia.Shedenies associated vomiting, visual disturbance orunilateral numbness or weakness. Initial duration:3 days InitialFrequency:daily InitialFrequency of abortive medication:Advil or Tylenol daily, Maxalt 2 days a week Triggers: Certain smells, sausages, bananas; brazilian nutes, COVID-19 (contracted in September 2020) Relieving factors: Exercise Activity:aggravates  During summer 2019, she has had throbbing occipital headache, right-sided neck pain radiating up to the occiput, a burning scalp neuralgia,associated with tinnitus, sometimesleftor right sidedfacial numbness, facial twitching. She started having a panic attack with palpitations. She went to the ED on 06/26/18 for symptoms. EKG showed sinus tachycardia. She was treated with headache cocktail.  To evaluate worsening headaches, shehad an MRI of the brain on 11/21/2018 which demonstrated nonspecific white matter hyperintensities in the cerebral white matter, likely chronic small vessel ischemic changes, but no acute abnormality.  For further evaluation of neck pain, cervical spine X-ray on 04/10/2019 showed slight refersal of the normal cervical lordosis with minimal retrolisthesis of C5 on C6 and cervical spondylosis with osteophytes most notable at C4-5 and C5-6.  She has bilateral tinnitus.  Exam by ENT unremarkable.  Past NSAIDS:Ibuprofen, naproxen, Cambia Past analgesics:Excedrin, Lidocaine nasal Past abortive triptans:Relpax, sumatriptantablet, Maxalt, Tosymra NS, Zembrace. Past abortive ergotamine:Migranal Past muscle relaxants:none Past anti-emetic:Zofran Past antihypertensive medications:Verapamil, propranolol Past antidepressant medications:Imipramine,  nortriptyline Past anticonvulsant medications:topiramate Past anti-CGRP:Aimovig (aggravated migraine), Emgality (ineffective), Ubrelvy (ineffective) Past vitamins/Herbal/Supplements:Riboflavin, magnesium Past antihistamines/decongestants:None Other past therapies:acupuncture, Reyvow  Family history of headache:Mom (migraines), daughter (migraines)  MRI and MRA of the head from 03/09/2009 revealed nonspecific small subcortical white matter hyperintensities and 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   PAST MEDICAL HISTORY: Past Medical History:  Diagnosis Date  . ACID REFLUX DISEASE 01/03/2008  . Anemia   . ANEMIA, IRON DEFICIENCY 12/23/2008  history  . Anxiety   . Chronic tension type headache 01/13/2009   migraines  . Complication of anesthesia    pt states she just needs a small amount or she will sleep too long  . Dysplasia of cervix, low grade (CIN 1)   . Fibroid   . GERD (gastroesophageal reflux disease)   . HEART MURMUR, SYSTOLIC 25/42/7062   will have echo 06/18/2013  . 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 Irritable bowel syndrome 12/23/2008  . Migraine   . Ovarian cyst   . Ovarian cyst   . THYROID NODULE, RIGHT 04/21/2009    MEDICATIONS: Current Outpatient Medications on File Prior to Visit  Medication Sig Dispense Refill  . acetaminophen (TYLENOL) 325 MG tablet Take 650 mg by mouth every 6 (six) hours as needed for mild pain.    Marland Kitchen AJOVY 225 MG/1.5ML SOAJ INJECT 225 MG INTO THE SKIN EVERY 30 (THIRTY) DAYS. 1.5 mL 11  . co-enzyme Q-10 30 MG capsule Take 200 mg by mouth 3 (three) times daily.    . cyclobenzaprine (FLEXERIL) 5 MG tablet Take 1 tablet (5 mg total) by mouth 3 (three) times daily as needed for muscle spasms. 30 tablet 1  . diazepam (VALIUM) 5 MG tablet Take 30-45 minutes prior to MRI 1 tablet 0  . gabapentin (NEURONTIN) 100 MG capsule Take 1 capsule (100 mg total) by mouth at bedtime. 30 capsule  5  . ibuprofen (ADVIL,MOTRIN) 200 MG tablet Take 200 mg by mouth every 8 (eight) hours as needed.    . pantoprazole (PROTONIX) 40 MG tablet Take 40 mg by mouth 2 (two) times daily.    . promethazine (PHENERGAN) 25 MG tablet Take 1 tablet (25 mg total) by mouth every 6 (six) hours as needed for nausea or vomiting. 30 tablet 5  . Rimegepant Sulfate (NURTEC) 75 MG TBDP Take by mouth as needed.    Marland Kitchen SYNTHROID 75 MCG tablet TAKE 1 TABLET (75 MCG TOTAL) BY MOUTH DAILY. 90 tablet 0   No current facility-administered medications on file prior to visit.    ALLERGIES: Allergies  Allergen Reactions  . Imitrex [Sumatriptan] Palpitations  . Propoxyphene N-Acetaminophen   . Nabumetone Rash    REACTION: GI upset  . Topiramate Rash    REACTION: rash    FAMILY HISTORY: Family History  Problem Relation Age of Onset  . Goiter Mother   . Heart disease Mother   . Thyroid disease Mother   . Heart attack Mother   . Graves' disease Sister   . Asthma Sister   . Cancer Father   . Heart disease Father   . Heart attack Father   . CVA Brother   . Hypertension Brother   . Hypertension Brother   . Alcoholism Brother   . Stomach cancer Sister   . Hepatitis Sister   . Stomach cancer Sister   . Goiter Other        Siblings 2  . Hypertension Paternal Grandfather   . Depression Neg Hx   . Alcohol abuse Neg Hx   . Drug abuse Neg Hx   . Early death Neg Hx   . Hearing loss Neg Hx   . Kidney disease Neg Hx   . Stroke Neg Hx     SOCIAL HISTORY: Social History   Socioeconomic History  . Marital status: Married    Spouse name: Shanon Brow  . Number of children: 3  . Years of education: Not on file  . Highest education level: Associate degree: occupational,  technical, or vocational program  Occupational History  . Occupation: WOMENS    Employer: [employer]  Tobacco Use  . Smoking status: Never Smoker  . Smokeless tobacco: Never Used  Vaping Use  . Vaping Use: Never used  Substance and Sexual  Activity  . Alcohol use: No    Comment: occ  . Drug use: No  . Sexual activity: Yes    Birth control/protection: Surgical    Comment: BTL  Other Topics Concern  . Not on file  Social History Narrative   Regular exercise-yes      Patient is right-handed.She lives with her husband in a 1 story home. She has recently been avoiding caffeine. Walks.   Social Determinants of Health   Financial Resource Strain:   . Difficulty of Paying Living Expenses:   Food Insecurity:   . Worried About Charity fundraiser in the Last Year:   . Arboriculturist in the Last Year:   Transportation Needs:   . Film/video editor (Medical):   Marland Kitchen Lack of Transportation (Non-Medical):   Physical Activity:   . Days of Exercise per Week:   . Minutes of Exercise per Session:   Stress:   . Feeling of Stress :   Social Connections:   . Frequency of Communication with Friends and Family:   . Frequency of Social Gatherings with Friends and Family:   . Attends Religious Services:   . Active Member of Clubs or Organizations:   . Attends Archivist Meetings:   Marland Kitchen Marital Status:   Intimate Partner Violence:   . Fear of Current or Ex-Partner:   . Emotionally Abused:   Marland Kitchen Physically Abused:   . Sexually Abused:     PHYSICAL EXAM: Blood pressure 131/75, pulse 66, height 5' (1.524 m), weight 131 lb 12.8 oz (59.8 kg), last menstrual period 04/20/2013, SpO2 100 %. General: No acute distress.  Patient appears well-groomed.   Head:  Normocephalic/atraumatic Eyes:  Fundi examined but not visualized Neck: supple, no paraspinal tenderness, full range of motion Heart:  Regular rate and rhythm Lungs:  Clear to auscultation bilaterally Back: No paraspinal tenderness Neurological Exam: alert and oriented to person, place, and time. Attention span and concentration intact, recent and remote memory intact, fund of knowledge intact.  Speech fluent and not dysarthric, language intact.  CN II-XII intact. Bulk and  tone normal, muscle strength 5/5 throughout.  Sensation to light touch, temperature and vibration intact.  Deep tendon reflexes 2+ throughout, toes downgoing.  Finger to nose and heel to shin testing intact.  Gait normal, Romberg negative.  IMPRESSION: 1. Migraine without and with aura, without status migrainosus, not intractable 2.  White matter changes on brain MRI.  Likely small vessel disease but will image to spinal cord to evaluate for evidence of demyelinating disease. 3.  Right sided occipital neuralgia/cervicogenic 4.  Cervical spondylosis with cervicalgia  PLAN: 1.  For preventative management, stop Ajovy and will start Vyepti. 2.  For abortive therapy, Ultracet.  She will stop Nurtec. 3. Check MRI of cervical and thoracic spine with and without contrast for further evaluation of white matter changes on brain. 4. Due to frequent headaches, I want her to have formal eye exam.  Refer to Dr. Kathlen Mody at [facility].  5. Limit use of pain relievers to no more than 2 days out of week to prevent risk of rebound or medication-overuse headache. 6.  Keep headache diary 7.  Exercise, hydration, caffeine cessation, sleep hygiene,  monitor for and avoid triggers 8. Recommend taking ASA 81mg  daily due to evidence of probable cerebrovascular disease.  9. Follow up 4 months.   Metta Clines, DO  CC:  Marrian Salvage, [place]

## 2020-02-22 ENCOUNTER — Ambulatory Visit: Payer: 59 | Admitting: Neurology

## 2020-02-22 ENCOUNTER — Encounter: Payer: Self-pay | Admitting: Neurology

## 2020-02-22 ENCOUNTER — Other Ambulatory Visit: Payer: Self-pay

## 2020-02-22 VITALS — BP 131/75 | HR 66 | Ht 60.0 in | Wt 131.8 lb

## 2020-02-22 DIAGNOSIS — R519 Headache, unspecified: Secondary | ICD-10-CM | POA: Diagnosis not present

## 2020-02-22 DIAGNOSIS — G43109 Migraine with aura, not intractable, without status migrainosus: Secondary | ICD-10-CM | POA: Diagnosis not present

## 2020-02-22 DIAGNOSIS — R9082 White matter disease, unspecified: Secondary | ICD-10-CM | POA: Diagnosis not present

## 2020-02-22 MED ORDER — TRAMADOL-ACETAMINOPHEN 37.5-325 MG PO TABS: 1.0000 | ORAL_TABLET | Freq: Four times a day (QID) | ORAL | 0 refills | Status: DC | PRN

## 2020-02-22 MED ORDER — DIAZEPAM 10 MG PO TABS
ORAL_TABLET | ORAL | 0 refills | Status: DC
Start: 2020-02-22 — End: 2020-04-14

## 2020-02-22 MED FILL — DIAZEPAM 10 MG TABS: 10 | 1 days supply | Qty: 1 | Fill #0

## 2020-02-22 MED FILL — TRAMADOL-ACETAMINOPHN 37.5-: 37.5-325 | 4 days supply | Qty: 15 | Fill #0

## 2020-02-22 NOTE — Patient Instructions (Addendum)
1.  Stop Ajovy.  Instead, we will try to set you up for Vyepti infusions. 2.  Stop Nurtec.  Instead, when you get a migraine, try tramadol-acetaminophen pill (Ultracet), 1 tablet every 6 hours as needed.  Limit use of this medications (as well as all pain relievers) to no more than 2 days out of week to prevent risk of rebound or medication-overuse headache. 3.  Will check MRI of cervical and thoracic spine with and without contrast.  Will give you diazepam (Valium) 10mg  to take 60 minutes prior to MRI.  Must have a driver and cannot drive for rest of day. 4.  Will refer you to Dr. Marshall Cork at [facility] 5.  Due to findings on brain MRI, recommend taking aspirin 81mg  daily 6.  Follow up in 4 months.   We have sent a referral to [facility] for your MRI and they will call you directly to schedule your appointment. They are located at [place]. If you need to contact them directly please call [phone].

## 2020-02-24 ENCOUNTER — Other Ambulatory Visit: Payer: Self-pay | Admitting: Neurology

## 2020-02-24 ENCOUNTER — Encounter: Payer: Self-pay | Admitting: Neurology

## 2020-02-24 DIAGNOSIS — G43719 Chronic migraine without aura, intractable, without status migrainosus: Secondary | ICD-10-CM

## 2020-02-24 DIAGNOSIS — G43019 Migraine without aura, intractable, without status migrainosus: Secondary | ICD-10-CM

## 2020-02-24 MED ORDER — UBRELVY 50 MG PO TABS: ORAL_TABLET | ORAL | 5 refills | Status: DC

## 2020-02-24 NOTE — Progress Notes (Signed)
Per Lovie Macadamia at [facility] will give pt  a call the schedule her appt 4 weeks out.

## 2020-02-24 NOTE — Progress Notes (Signed)
Chelsey Hunt (Key: [id number]) Rx #: [id number] Chelsey Hunt 50MG  tablets   Form MedImpact ePA Form 2017 NCPDP Created 39 minutes ago Sent to Plan 4 minutes ago Plan Response 4 minutes ago Submit Clinical Questions 1 minute ago Determination Favorable less than a minute ago Message from Plan The request has been approved. The authorization is effective for a maximum of 6 fills from 02/24/2020 to 08/24/2020, as long as the member is enrolled in their current health plan. This has been approved for a quantity limit of 16.0 with a day supply limit of 30.0. A written notification letter will follow with additional details.

## 2020-02-25 ENCOUNTER — Ambulatory Visit (INDEPENDENT_AMBULATORY_CARE_PROVIDER_SITE_OTHER): Payer: 59

## 2020-02-25 ENCOUNTER — Encounter: Payer: Self-pay | Admitting: Podiatry

## 2020-02-25 ENCOUNTER — Ambulatory Visit: Payer: 59 | Admitting: Podiatry

## 2020-02-25 DIAGNOSIS — M779 Enthesopathy, unspecified: Secondary | ICD-10-CM | POA: Diagnosis not present

## 2020-02-25 DIAGNOSIS — M722 Plantar fascial fibromatosis: Secondary | ICD-10-CM | POA: Diagnosis not present

## 2020-02-25 MED FILL — UBRELVY 50 MG TABS: 50 | 30 days supply | Qty: 10 | Fill #0

## 2020-02-25 NOTE — Progress Notes (Signed)
Subjective:   Patient ID: Chelsey Hunt, female   DOB: 54 y.o.   MRN: [id number]   HPI Patient states for around a month she developed a lot of pain in her right foot and she is not sure what may have happened.  States it sore and makes it hard for her to walk   ROS      Objective:  Physical Exam  Neurovascular status intact with inflammation pain which appears to be more around the third MPJ right given inflammation fluid of the joint with patient noted to have mild dorsal swelling     Assessment:  Acute capsulitis third MPJ right with possibility for other pathology     Plan:  H&P reviewed condition sterile prep done aspirated the joint getting a small amount of clear fluid injected quarter cc dexamethasone Kenalog applied thick plantar pad to reduce pressure and reappoint to recheck  X-rays indicate elongation third metatarsal segment right with no indication stress fracture arthritis

## 2020-03-07 ENCOUNTER — Encounter: Payer: Self-pay | Admitting: Family

## 2020-03-07 ENCOUNTER — Ambulatory Visit (INDEPENDENT_AMBULATORY_CARE_PROVIDER_SITE_OTHER): Payer: 59 | Admitting: Family

## 2020-03-07 ENCOUNTER — Other Ambulatory Visit: Payer: Self-pay

## 2020-03-07 VITALS — BP 118/76 | HR 78 | Temp 98.6°F | Ht 60.0 in | Wt 131.0 lb

## 2020-03-07 DIAGNOSIS — Z13220 Encounter for screening for lipoid disorders: Secondary | ICD-10-CM | POA: Diagnosis not present

## 2020-03-07 DIAGNOSIS — E559 Vitamin D deficiency, unspecified: Secondary | ICD-10-CM | POA: Diagnosis not present

## 2020-03-07 DIAGNOSIS — Z23 Encounter for immunization: Secondary | ICD-10-CM

## 2020-03-07 DIAGNOSIS — E039 Hypothyroidism, unspecified: Secondary | ICD-10-CM

## 2020-03-07 DIAGNOSIS — Z0000 Encounter for general adult medical examination without abnormal findings: Secondary | ICD-10-CM

## 2020-03-07 LAB — CBC WITH DIFFERENTIAL/PLATELET
Basophils Absolute: 0 10*3/uL (ref 0.0–0.1)
Basophils Relative: 0.8 % (ref 0.0–3.0)
Eosinophils Absolute: 0.2 10*3/uL (ref 0.0–0.7)
Eosinophils Relative: 3.8 % (ref 0.0–5.0)
HCT: 39.9 % (ref 36.0–46.0)
Hemoglobin: 13.3 g/dL (ref 12.0–15.0)
Lymphocytes Relative: 57.8 % — ABNORMAL HIGH (ref 12.0–46.0)
Lymphs Abs: 2.7 10*3/uL (ref 0.7–4.0)
MCHC: 33.3 g/dL (ref 30.0–36.0)
MCV: 92.8 fl (ref 78.0–100.0)
Monocytes Absolute: 0.3 10*3/uL (ref 0.1–1.0)
Monocytes Relative: 5.7 % (ref 3.0–12.0)
Neutro Abs: 1.5 10*3/uL (ref 1.4–7.7)
Neutrophils Relative %: 31.9 % — ABNORMAL LOW (ref 43.0–77.0)
Platelets: 357 10*3/uL (ref 150.0–400.0)
RBC: 4.3 Mil/uL (ref 3.87–5.11)
RDW: 13.6 % (ref 11.5–15.5)
WBC: 4.7 10*3/uL (ref 4.0–10.5)

## 2020-03-07 LAB — COMPREHENSIVE METABOLIC PANEL
ALT: 15 U/L (ref 0–35)
AST: 15 U/L (ref 0–37)
Albumin: 4.4 g/dL (ref 3.5–5.2)
Alkaline Phosphatase: 97 U/L (ref 39–117)
BUN: 16 mg/dL (ref 6–23)
CO2: 30 mEq/L (ref 19–32)
Calcium: 10.2 mg/dL (ref 8.4–10.5)
Chloride: 104 mEq/L (ref 96–112)
Creatinine, Ser: 0.96 mg/dL (ref 0.40–1.20)
GFR: 73.07 mL/min (ref >60.00)
Glucose, Bld: 88 mg/dL (ref 70–99)
Potassium: 3.9 mEq/L (ref 3.5–5.1)
Sodium: 140 mEq/L (ref 135–145)
Total Bilirubin: 0.4 mg/dL (ref 0.2–1.2)
Total Protein: 7.8 g/dL (ref 6.0–8.3)

## 2020-03-07 LAB — TSH: TSH: 3.47 u[IU]/mL (ref 0.35–4.50)

## 2020-03-07 LAB — LIPID PANEL
Cholesterol: 228 mg/dL — ABNORMAL HIGH (ref 0–200)
HDL: 70.6 mg/dL (ref >39.00)
LDL Cholesterol: 141 mg/dL — ABNORMAL HIGH (ref 0–99)
NonHDL: 156.94
Total CHOL/HDL Ratio: 3
Triglycerides: 81 mg/dL (ref 0.0–149.0)
VLDL: 16.2 mg/dL (ref 0.0–40.0)

## 2020-03-07 LAB — VITAMIN D 25 HYDROXY (VIT D DEFICIENCY, FRACTURES): VITD: 31.47 ng/mL (ref 30.00–100.00)

## 2020-03-07 NOTE — Addendum Note (Signed)
Addended by: Lauralee Evener C on: 03/07/2020 01:30 PM   Modules accepted: Orders

## 2020-03-07 NOTE — Progress Notes (Signed)
Chelsey Hunt is a 54 y.o. female with the following history as recorded in EpicCare:  Patient Active Problem List   Diagnosis Date Noted  . Raynaud phenomenon 09/17/2019  . Hand cramps 09/17/2019  . Carpal tunnel syndrome on left 09/17/2019  . Anxiety 07/30/2019  . Rash and nonspecific skin eruption 07/01/2019  . Musculoskeletal strain 03/25/2019  . Visit for screening mammogram 03/04/2019  . Chronic idiopathic constipation 01/08/2019  . Essential hypertension 10/24/2018  . Psychophysiological insomnia 07/07/2018  . Seasonal allergic rhinitis due to pollen 08/08/2017  . Intractable migraine without aura and without status migrainosus 07/02/2016  . Hyperglycemia 11/28/2015  . Pancreas divisum of native pancreas 12/27/2014  . Routine general medical examination at a health care facility 07/30/2012  . GERD (gastroesophageal reflux disease)   . Hypothyroidism 08/12/2009    Current Outpatient Medications  Medication Sig Dispense Refill  . acetaminophen (TYLENOL) 325 MG tablet Take 650 mg by mouth every 6 (six) hours as needed for mild pain.    Marland Kitchen co-enzyme Q-10 30 MG capsule Take 200 mg by mouth 3 (three) times daily.    . cyclobenzaprine (FLEXERIL) 5 MG tablet Take 1 tablet (5 mg total) by mouth 3 (three) times daily as needed for muscle spasms. 30 tablet 1  . diazepam (VALIUM) 10 MG tablet Take 60 minutes prior to MRI 1 tablet 0  . gabapentin (NEURONTIN) 100 MG capsule Take 1 capsule (100 mg total) by mouth at bedtime. 30 capsule 5  . pantoprazole (PROTONIX) 40 MG tablet Take 40 mg by mouth 2 (two) times daily.    . promethazine (PHENERGAN) 25 MG tablet Take 1 tablet (25 mg total) by mouth every 6 (six) hours as needed for nausea or vomiting. 30 tablet 5  . SYNTHROID 75 MCG tablet TAKE 1 TABLET (75 MCG TOTAL) BY MOUTH DAILY. 90 tablet 0  . Ubrogepant (UBRELVY) 50 MG TABS Take 1 tablet for migraine.  May repeat in 2 hours if needed.  Maximum 2 tablets in 24 hours. 10 tablet 5  .  AJOVY 225 MG/1.5ML SOAJ INJECT 225 MG INTO THE SKIN EVERY 30 (THIRTY) DAYS. (Patient not taking: Reported on 03/07/2020) 1.5 mL 11  . ibuprofen (ADVIL,MOTRIN) 200 MG tablet Take 200 mg by mouth every 8 (eight) hours as needed. (Patient not taking: Reported on 03/07/2020)    . Rimegepant Sulfate (NURTEC) 75 MG TBDP Take by mouth as needed. (Patient not taking: Reported on 03/07/2020)    . traMADol-acetaminophen (ULTRACET) 37.5-325 MG tablet Take 1 tablet by mouth every 6 (six) hours as needed. (Patient not taking: Reported on 03/07/2020) 15 tablet 0   No current facility-administered medications for this visit.    Allergies: Imitrex [sumatriptan], Propoxyphene n-acetaminophen, Nabumetone, and Topiramate  Past Medical History:  Diagnosis Date  . ACID REFLUX DISEASE 01/03/2008  . Anemia   . ANEMIA, IRON DEFICIENCY 12/23/2008   history  . Anxiety   . Chronic tension type headache 01/13/2009   migraines  . Complication of anesthesia    pt states she just needs a small amount or she will sleep too long  . Dysplasia of cervix, low grade (CIN 1)   . Fibroid   . GERD (gastroesophageal reflux disease)   . HEART MURMUR, SYSTOLIC 04/59/9774   will have echo 06/18/2013  . 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 Irritable bowel syndrome 12/23/2008  . Migraine   . Ovarian cyst   . Ovarian cyst   . THYROID NODULE, RIGHT 04/21/2009    Past Surgical History:  Procedure Laterality Date  . ABDOMINAL HYSTERECTOMY    . COLONOSCOPY    . HYSTEROSCOPY    . LAPAROSCOPY    . NOVASURE ABLATION    . POLYPECTOMY    . TUBAL LIGATION    . UPPER GI ENDOSCOPY    . VAGINAL HYSTERECTOMY N/A 06/24/2013   Procedure: TOTAL HYSTERECTOMY VAGINAL;  Surgeon: Alwyn Pea, MD;  Location: [facility];  Service: Gynecology;  Laterality: N/A;    Family History  Problem Relation Age of Onset  . Goiter Mother   . Heart disease Mother   . Thyroid disease Mother   . Heart attack Mother   . Graves' disease Sister   . Asthma  Sister   . Cancer Father   . Heart disease Father   . Heart attack Father   . CVA Brother   . Hypertension Brother   . Hypertension Brother   . Alcoholism Brother   . Stomach cancer Sister   . Hepatitis Sister   . Stomach cancer Sister   . Goiter Other        Siblings 2  . Hypertension Paternal Grandfather   . Depression Neg Hx   . Alcohol abuse Neg Hx   . Drug abuse Neg Hx   . Early death Neg Hx   . Hearing loss Neg Hx   . Kidney disease Neg Hx   . Stroke Neg Hx     Social History   Tobacco Use  . Smoking status: Never Smoker  . Smokeless tobacco: Never Used  Substance Use Topics  . Alcohol use: No    Comment: occ    Subjective:  Presents for yearly CPE; does have GYN as well- GYN managing her pap smears and mammograms; has been working with her neurologist for management of complicated migraines; up to date on dental and vision exams;   Review of Systems  Constitutional: Negative.   HENT: Negative.   Eyes: Negative.   Respiratory: Negative.   Cardiovascular: Negative.   Gastrointestinal: Negative.   Genitourinary: Negative.   Musculoskeletal: Negative.   Skin: Negative.   Neurological: Positive for headaches.  Endo/Heme/Allergies: Negative.   Psychiatric/Behavioral: Negative.      Objective:  Vitals:   03/07/20 1302  BP: 118/76  Pulse: 78  Temp: 98.6 F (37 C)  TempSrc: Oral  SpO2: 99%  Weight: 131 lb (59.4 kg)  Height: 5' (1.524 m)    General: Well developed, well nourished, in no acute distress  Skin : Warm and dry.  Head: Normocephalic and atraumatic  Eyes: Sclera and conjunctiva clear; pupils round and reactive to light; extraocular movements intact  Ears: External normal; canals clear; tympanic membranes normal  Oropharynx: Pink, supple. No suspicious lesions  Neck: Supple without thyromegaly, adenopathy  Lungs: Respirations unlabored; clear to auscultation bilaterally without wheeze, rales, rhonchi  CVS exam: normal rate and regular  rhythm.  Abdomen: Soft; nontender; nondistended; normoactive bowel sounds; no masses or hepatosplenomegaly  Musculoskeletal: No deformities; no active joint inflammation  Extremities: No edema, cyanosis, clubbing  Vessels: Symmetric bilaterally  Neurologic: Alert and oriented; speech intact; face symmetrical; moves all extremities well; CNII-XII intact without focal deficit   Assessment:  1. PE (physical exam), annual   2. Lipid screening   3. Acquired hypothyroidism   4. Vitamin D deficiency     Plan:  Age appropriate preventive healthcare needs addressed; encouraged regular eye doctor and dental exams; encouraged regular exercise; will update labs and refills as needed today; follow-up to be determined; Tdap  updated today;  No follow-ups on file.  Orders Placed This Encounter  Procedures  . CBC with Differential/Platelet  . Comp Met (CMET)  . Lipid panel  . TSH  . Vitamin D (25 hydroxy)    Requested Prescriptions    No prescriptions requested or ordered in this encounter

## 2020-03-08 ENCOUNTER — Other Ambulatory Visit: Payer: Self-pay | Admitting: Family

## 2020-03-08 ENCOUNTER — Encounter: Payer: Self-pay | Admitting: Family

## 2020-03-08 DIAGNOSIS — E039 Hypothyroidism, unspecified: Secondary | ICD-10-CM

## 2020-03-08 MED ORDER — SYNTHROID 75 MCG PO TABS: 75.0000 ug | ORAL_TABLET | Freq: Every day | ORAL | 3 refills | Status: DC

## 2020-03-10 ENCOUNTER — Encounter: Payer: Self-pay | Admitting: Podiatry

## 2020-03-10 ENCOUNTER — Other Ambulatory Visit: Payer: Self-pay

## 2020-03-10 ENCOUNTER — Ambulatory Visit: Payer: 59 | Admitting: Podiatry

## 2020-03-10 DIAGNOSIS — M779 Enthesopathy, unspecified: Secondary | ICD-10-CM

## 2020-03-15 NOTE — Progress Notes (Signed)
Subjective:   Patient ID: Chelsey Hunt, female   DOB: 54 y.o.   MRN: [id number]   HPI Patient states overall she has been feeling good with her foot with diminishment of discomfort with occasional pain only with intense activity   ROS      Objective:  Physical Exam  Neurovascular status intact with patient's right foot feeling much better with diminishment of discomfort upon palpation     Assessment:  Neuritis-like symptoms improved     Plan:  H&P reviewed condition recommended supportive therapy anti-inflammatories and reappoint for [place] to recheck again at 2 years topical both food and oral medicines as needed if symptoms are mild

## 2020-03-23 ENCOUNTER — Telehealth: Payer: Self-pay

## 2020-03-23 NOTE — Telephone Encounter (Signed)
Telephone call to pt husband in regards to his FMLA paperwork.  Please give me a call back.

## 2020-03-23 NOTE — Telephone Encounter (Signed)
Date should start 03/25/20-03/25/2021

## 2020-03-26 ENCOUNTER — Other Ambulatory Visit: Payer: Self-pay | Admitting: Neurology

## 2020-03-26 ENCOUNTER — Ambulatory Visit
Admission: RE | Admit: 2020-03-26 | Discharge: 2020-03-26 | Disposition: A | Discharge status: Home / Self Care | Payer: 59 | Source: Ambulatory Visit | Attending: Neurology | Admitting: Neurology

## 2020-03-26 DIAGNOSIS — G43109 Migraine with aura, not intractable, without status migrainosus: Secondary | ICD-10-CM

## 2020-03-26 DIAGNOSIS — M47812 Spondylosis without myelopathy or radiculopathy, cervical region: Secondary | ICD-10-CM | POA: Diagnosis not present

## 2020-03-26 DIAGNOSIS — M40204 Unspecified kyphosis, thoracic region: Secondary | ICD-10-CM | POA: Diagnosis not present

## 2020-03-26 DIAGNOSIS — R519 Headache, unspecified: Secondary | ICD-10-CM

## 2020-03-26 DIAGNOSIS — R9082 White matter disease, unspecified: Secondary | ICD-10-CM

## 2020-03-26 DIAGNOSIS — M4802 Spinal stenosis, cervical region: Secondary | ICD-10-CM | POA: Diagnosis not present

## 2020-03-28 ENCOUNTER — Other Ambulatory Visit: Payer: 59

## 2020-03-30 ENCOUNTER — Telehealth: Payer: Self-pay

## 2020-03-30 ENCOUNTER — Other Ambulatory Visit: Payer: Self-pay

## 2020-03-30 DIAGNOSIS — R9389 Abnormal findings on diagnostic imaging of other specified body structures: Secondary | ICD-10-CM

## 2020-03-30 NOTE — Telephone Encounter (Signed)
-----   Message from Pieter Partridge, DO sent at 03/28/2020  3:00 PM EDT ----- There is no abnormality involving the spinal cord that would suggest MS.  There is an incidental finding.  There is a spot seen on one of the vertebral bodies in the thoracic spine.  It is most likely a benign mass.  However, for further evaluation I would like to get an MRI of thoracic spine WITH contrast.

## 2020-03-30 NOTE — Telephone Encounter (Signed)
LMOVM to call us back

## 2020-03-31 ENCOUNTER — Other Ambulatory Visit: Payer: Self-pay

## 2020-03-31 DIAGNOSIS — D1809 Hemangioma of other sites: Secondary | ICD-10-CM

## 2020-03-31 DIAGNOSIS — R9389 Abnormal findings on diagnostic imaging of other specified body structures: Secondary | ICD-10-CM

## 2020-04-03 ENCOUNTER — Other Ambulatory Visit: Payer: Self-pay

## 2020-04-03 ENCOUNTER — Encounter (HOSPITAL_COMMUNITY): Payer: Self-pay

## 2020-04-03 ENCOUNTER — Emergency Department (HOSPITAL_COMMUNITY)
Admission: EM | Admit: 2020-04-03 | Discharge: 2020-04-03 | Disposition: A | Discharge status: Home / Self Care | Payer: 59 | Attending: Emergency Medicine | Admitting: Emergency Medicine

## 2020-04-03 DIAGNOSIS — Z79899 Other long term (current) drug therapy: Secondary | ICD-10-CM | POA: Insufficient documentation

## 2020-04-03 DIAGNOSIS — E039 Hypothyroidism, unspecified: Secondary | ICD-10-CM | POA: Insufficient documentation

## 2020-04-03 DIAGNOSIS — R109 Unspecified abdominal pain: Secondary | ICD-10-CM | POA: Insufficient documentation

## 2020-04-03 DIAGNOSIS — S76212A Strain of adductor muscle, fascia and tendon of left thigh, initial encounter: Secondary | ICD-10-CM

## 2020-04-03 DIAGNOSIS — I10 Essential (primary) hypertension: Secondary | ICD-10-CM | POA: Insufficient documentation

## 2020-04-03 DIAGNOSIS — R9431 Abnormal electrocardiogram [ECG] [EKG]: Secondary | ICD-10-CM | POA: Diagnosis not present

## 2020-04-03 NOTE — Discharge Instructions (Signed)
Please return for any problem.  Follow-up with your regular care provider as instructed.  Use ibuprofen as instructed for treatment of your pain.

## 2020-04-03 NOTE — ED Triage Notes (Signed)
Pt arrives to ED w/ c/o L sided groin pain that started yesterday. Pt rates pain 5/10 pain. Pt denies injury/trauma.

## 2020-04-03 NOTE — ED Provider Notes (Signed)
[facility] EMERGENCY DEPARTMENT Provider Note   CSN: [id number] Arrival date & time: 04/03/20  1348     History Chief Complaint  Patient presents with   Groin Pain    Chelsey Hunt is a 54 y.o. female.  54 year old female with prior medical history detailed below presents for evaluation of left-sided groin pain.  Patient reports that her left anterior groin overlying her hip flexors started to hurt 1 day prior.  She did not take anything for this pain.  Pain is worse with lifting or flexion of the left hip.  She denies fever.  She denies fall or trauma.  The history is provided by the patient.  Groin Pain This is a new problem. The current episode started yesterday. The problem occurs constantly. The problem has not changed since onset.Pertinent negatives include no chest pain, no abdominal pain, no headaches and no shortness of breath. Nothing aggravates the symptoms. Nothing relieves the symptoms.       Past Medical History:  Diagnosis Date   ACID REFLUX DISEASE 01/03/2008   Anemia    ANEMIA, IRON DEFICIENCY 12/23/2008   history   Anxiety    Chronic tension type headache 01/13/2009   migraines   Complication of anesthesia    pt states she just needs a small amount or she will sleep too long   Dysplasia of cervix, low grade (CIN 1)    Fibroid    GERD (gastroesophageal reflux disease)    HEART MURMUR, SYSTOLIC 25/01/3975   will have echo 06/18/2013   HYPOTHYROIDISM, POST-RADIATION 08/12/2009   Irritable bowel syndrome 12/23/2008   Migraine    Ovarian cyst    Ovarian cyst    THYROID NODULE, RIGHT 04/21/2009    Patient Active Problem List   Diagnosis Date Noted   Raynaud phenomenon 09/17/2019   Hand cramps 09/17/2019   Carpal tunnel syndrome on left 09/17/2019   Anxiety 07/30/2019   Rash and nonspecific skin eruption 07/01/2019   Musculoskeletal strain 03/25/2019   Visit for screening mammogram 03/04/2019   Chronic  idiopathic constipation 01/08/2019   Essential hypertension 10/24/2018   Psychophysiological insomnia 07/07/2018   Seasonal allergic rhinitis due to pollen 08/08/2017   Intractable migraine without aura and without status migrainosus 07/02/2016   Hyperglycemia 11/28/2015   Pancreas divisum of native pancreas 12/27/2014   Routine general medical examination at a health care facility 07/30/2012   GERD (gastroesophageal reflux disease)    Hypothyroidism 08/12/2009    Past Surgical History:  Procedure Laterality Date   ABDOMINAL HYSTERECTOMY     COLONOSCOPY     HYSTEROSCOPY     LAPAROSCOPY     NOVASURE ABLATION     POLYPECTOMY     TUBAL LIGATION     UPPER GI ENDOSCOPY     VAGINAL HYSTERECTOMY N/A 06/24/2013   Procedure: TOTAL HYSTERECTOMY VAGINAL;  Surgeon: Alwyn Pea, MD;  Location: [facility];  Service: Gynecology;  Laterality: N/A;     OB History    Gravida  3   Para  3   Term      Preterm      AB      Living  3     SAB      TAB      Ectopic      Multiple      Live Births  3           Family History  Problem Relation Age of Onset   Goiter Mother  Heart disease Mother    Thyroid disease Mother    Heart attack Mother    Berenice Primas' disease Sister    Asthma Sister    Cancer Father    Heart disease Father    Heart attack Father    CVA Brother    Hypertension Brother    Hypertension Brother    Alcoholism Brother    Stomach cancer Sister    Hepatitis Sister    Stomach cancer Sister    Goiter Other        Siblings 2   Hypertension Paternal Grandfather    Depression Neg Hx    Alcohol abuse Neg Hx    Drug abuse Neg Hx    Early death Neg Hx    Hearing loss Neg Hx    Kidney disease Neg Hx    Stroke Neg Hx     Social History   Tobacco Use   Smoking status: Never Smoker   Smokeless tobacco: Never Used  Scientific laboratory technician Use: Never used  Substance Use Topics   Alcohol use: No    Comment: occ    Drug use: No    Home Medications Prior to Admission medications   Medication Sig Start Date End Date Taking? Authorizing Provider  acetaminophen (TYLENOL) 325 MG tablet Take 650 mg by mouth every 6 (six) hours as needed for mild pain.    [provider]  AJOVY 225 MG/1.5ML SOAJ INJECT 225 MG INTO THE SKIN EVERY 30 (THIRTY) DAYS. Patient not taking: Reported on 03/07/2020 02/17/20   Pieter Partridge, DO  co-enzyme Q-10 30 MG capsule Take 200 mg by mouth 3 (three) times daily.    [provider]  cyclobenzaprine (FLEXERIL) 5 MG tablet Take 1 tablet (5 mg total) by mouth 3 (three) times daily as needed for muscle spasms. 04/10/19   Marrian Salvage, FNP  diazepam (VALIUM) 10 MG tablet Take 60 minutes prior to MRI 02/22/20   Pieter Partridge, DO  gabapentin (NEURONTIN) 100 MG capsule Take 1 capsule (100 mg total) by mouth at bedtime. 10/12/19   Pieter Partridge, DO  ibuprofen (ADVIL,MOTRIN) 200 MG tablet Take 200 mg by mouth every 8 (eight) hours as needed. Patient not taking: Reported on 03/07/2020    [provider]  pantoprazole (PROTONIX) 40 MG tablet Take 40 mg by mouth 2 (two) times daily.    [provider]  promethazine (PHENERGAN) 25 MG tablet Take 1 tablet (25 mg total) by mouth every 6 (six) hours as needed for nausea or vomiting. 10/12/19   Pieter Partridge, DO  Rimegepant Sulfate (NURTEC) 75 MG TBDP Take by mouth as needed. Patient not taking: Reported on 03/07/2020    [provider]  SYNTHROID 75 MCG tablet Take 1 tablet (75 mcg total) by mouth daily. 03/08/20   Marrian Salvage, FNP  traMADol-acetaminophen (ULTRACET) 37.5-325 MG tablet Take 1 tablet by mouth every 6 (six) hours as needed. Patient not taking: Reported on 03/07/2020 02/22/20   Pieter Partridge, DO  Ubrogepant (UBRELVY) 50 MG TABS Take 1 tablet for migraine.  May repeat in 2 hours if needed.  Maximum 2 tablets in 24 hours. 02/24/20   Pieter Partridge, DO    Allergies    Imitrex  [sumatriptan], Propoxyphene n-acetaminophen, Nabumetone, and Topiramate  Review of Systems   Review of Systems  Respiratory: Negative for shortness of breath.   Cardiovascular: Negative for chest pain.  Gastrointestinal: Negative for abdominal pain.  Neurological: Negative  for headaches.  All other systems reviewed and are negative.   Physical Exam Updated Vital Signs BP 122/79    Pulse 78    Temp 98 F (36.7 C) (Oral)    Resp 18    LMP 04/20/2013    SpO2 100%   Physical Exam Vitals and nursing note reviewed.  Constitutional:      General: She is not in acute distress.    Appearance: Normal appearance. She is well-developed.  HENT:     Head: Normocephalic and atraumatic.  Eyes:     Conjunctiva/sclera: Conjunctivae normal.     Pupils: Pupils are equal, round, and reactive to light.  Cardiovascular:     Rate and Rhythm: Normal rate and regular rhythm.     Heart sounds: Normal heart sounds.  Pulmonary:     Effort: Pulmonary effort is normal. No respiratory distress.     Breath sounds: Normal breath sounds.  Abdominal:     General: There is no distension.     Palpations: Abdomen is soft.     Tenderness: There is no abdominal tenderness.  Musculoskeletal:        General: No deformity. Normal range of motion.     Cervical back: Normal range of motion and neck supple.     Comments: Mild tenderness with palpation overlying the left anterior hip flexors.  Full active range of motion of the left hip.  There is reported pain with flexion of the left hip.  Skin:    General: Skin is warm and dry.  Neurological:     General: No focal deficit present.     Mental Status: She is alert and oriented to person, place, and time.     ED Results / Procedures / Treatments   Labs (all labs ordered are listed, but only abnormal results are displayed) Labs Reviewed - No data to display  EKG EKG Interpretation  Date/Time:  Sunday April 03 2020 19:17:38 EDT Ventricular Rate:  65 PR  Interval:    QRS Duration: 84 QT Interval:  417 QTC Calculation: 434 R Axis:   53 Text Interpretation: Sinus rhythm ST elev, probable normal early repol pattern Confirmed by Dene Gentry [phone]) on 04/03/2020 7:19:15 PM   Radiology No results found.  Procedures Procedures (including critical care time)  Medications Ordered in ED Medications - No data to display  ED Course  I have reviewed the triage vital signs and the nursing notes.  Pertinent labs & imaging results that were available during my care of the patient were reviewed by me and considered in my medical decision making (see chart for details).    MDM Rules/Calculators/A&P                          MDM  Screen complete  Chelsey Hunt was evaluated in Emergency Department on 04/03/2020 for the symptoms described in the history of present illness. She was evaluated in the context of the global COVID-19 pandemic, which necessitated consideration that the patient might be at risk for infection with the SARS-CoV-2 virus that causes COVID-19. Institutional protocols and algorithms that pertain to the evaluation of patients at risk for COVID-19 are in a state of rapid change based on information released by regulatory bodies including the CDC and federal and state organizations. These policies and algorithms were followed during the patient's care in the ED.   Patient is presenting for evaluation of left-sided hip discomfort.  Patient's exam and presentation are consistent  with likely strain of the left hip flexors.  Patient was offered plain films she declined.  Of note, patient did request EKG.  EKG was without evidence of acute ischemia.  Patient is appropriate for outpatient discharge.  Strict return precautions given and understood.   Final Clinical Impression(s) / ED Diagnoses Final diagnoses:  Inguinal strain, left, initial encounter    Rx / DC Orders ED Discharge Orders    None       Valarie Merino, MD 04/03/20 2011

## 2020-04-05 MED FILL — PANTOPRAZOLE SOD DR 40 MG T: 40 | 90 days supply | Qty: 180 | Fill #1

## 2020-04-14 DIAGNOSIS — Z0279 Encounter for issue of other medical certificate: Secondary | ICD-10-CM

## 2020-04-14 MED ORDER — DIAZEPAM 10 MG PO TABS
ORAL_TABLET | ORAL | 0 refills | Status: DC
Start: 2020-04-14 — End: 2020-06-14

## 2020-04-14 MED FILL — DIAZEPAM 10 MG TABS: 10 | 1 days supply | Qty: 1 | Fill #0

## 2020-04-20 DIAGNOSIS — K581 Irritable bowel syndrome with constipation: Secondary | ICD-10-CM | POA: Diagnosis not present

## 2020-04-20 DIAGNOSIS — K219 Gastro-esophageal reflux disease without esophagitis: Secondary | ICD-10-CM | POA: Diagnosis not present

## 2020-04-21 DIAGNOSIS — H04123 Dry eye syndrome of bilateral lacrimal glands: Secondary | ICD-10-CM | POA: Diagnosis not present

## 2020-04-21 DIAGNOSIS — G43B0 Ophthalmoplegic migraine, not intractable: Secondary | ICD-10-CM | POA: Diagnosis not present

## 2020-04-22 NOTE — Progress Notes (Signed)
FMLA forms filled out and faxed to [employer] and mailed to pt. Per PT request

## 2020-04-29 ENCOUNTER — Ambulatory Visit: Payer: 59 | Admitting: Family

## 2020-04-29 ENCOUNTER — Ambulatory Visit (INDEPENDENT_AMBULATORY_CARE_PROVIDER_SITE_OTHER): Payer: 59

## 2020-04-29 ENCOUNTER — Other Ambulatory Visit: Payer: Self-pay

## 2020-04-29 ENCOUNTER — Encounter: Payer: Self-pay | Admitting: Family

## 2020-04-29 VITALS — BP 140/80 | HR 86 | Temp 98.6°F | Ht 60.0 in | Wt 134.2 lb

## 2020-04-29 DIAGNOSIS — R0789 Other chest pain: Secondary | ICD-10-CM

## 2020-04-29 DIAGNOSIS — R109 Unspecified abdominal pain: Secondary | ICD-10-CM | POA: Diagnosis not present

## 2020-04-29 DIAGNOSIS — R079 Chest pain, unspecified: Secondary | ICD-10-CM | POA: Diagnosis not present

## 2020-04-29 NOTE — Progress Notes (Signed)
Chelsey Hunt is a 54 y.o. female with the following history as recorded in EpicCare:  Patient Active Problem List   Diagnosis Date Noted  . Raynaud phenomenon 09/17/2019  . Hand cramps 09/17/2019  . Carpal tunnel syndrome on left 09/17/2019  . Anxiety 07/30/2019  . Rash and nonspecific skin eruption 07/01/2019  . Musculoskeletal strain 03/25/2019  . Visit for screening mammogram 03/04/2019  . Chronic idiopathic constipation 01/08/2019  . Essential hypertension 10/24/2018  . Psychophysiological insomnia 07/07/2018  . Seasonal allergic rhinitis due to pollen 08/08/2017  . Intractable migraine without aura and without status migrainosus 07/02/2016  . Hyperglycemia 11/28/2015  . Pancreas divisum of native pancreas 12/27/2014  . Routine general medical examination at a health care facility 07/30/2012  . GERD (gastroesophageal reflux disease)   . Hypothyroidism 08/12/2009    Current Outpatient Medications  Medication Sig Dispense Refill  . acetaminophen (TYLENOL) 325 MG tablet Take 650 mg by mouth every 6 (six) hours as needed for mild pain.    Marland Kitchen AJOVY 225 MG/1.5ML SOAJ INJECT 225 MG INTO THE SKIN EVERY 30 (THIRTY) DAYS. 1.5 mL 11  . co-enzyme Q-10 30 MG capsule Take 200 mg by mouth 3 (three) times daily.    . cyclobenzaprine (FLEXERIL) 5 MG tablet Take 1 tablet (5 mg total) by mouth 3 (three) times daily as needed for muscle spasms. 30 tablet 1  . diazepam (VALIUM) 10 MG tablet Take 60 minutes prior to MRI 1 tablet 0  . dicyclomine (BENTYL) 10 MG capsule Take by mouth.    . gabapentin (NEURONTIN) 100 MG capsule Take 1 capsule (100 mg total) by mouth at bedtime. 30 capsule 5  . ibuprofen (ADVIL,MOTRIN) 200 MG tablet Take 200 mg by mouth every 8 (eight) hours as needed.     . pantoprazole (PROTONIX) 40 MG tablet Take 40 mg by mouth 2 (two) times daily.    . promethazine (PHENERGAN) 25 MG tablet Take 1 tablet (25 mg total) by mouth every 6 (six) hours as needed for nausea or vomiting.  30 tablet 5  . SYNTHROID 75 MCG tablet Take 1 tablet (75 mcg total) by mouth daily. 90 tablet 3  . traMADol-acetaminophen (ULTRACET) 37.5-325 MG tablet Take 1 tablet by mouth every 6 (six) hours as needed. 15 tablet 0  . Ubrogepant (UBRELVY) 50 MG TABS Take 1 tablet for migraine.  May repeat in 2 hours if needed.  Maximum 2 tablets in 24 hours. 10 tablet 5   No current facility-administered medications for this visit.    Allergies: Imitrex [sumatriptan], Propoxyphene n-acetaminophen, Nabumetone, and Topiramate  Past Medical History:  Diagnosis Date  . ACID REFLUX DISEASE 01/03/2008  . Anemia   . ANEMIA, IRON DEFICIENCY 12/23/2008   history  . Anxiety   . Chronic tension type headache 01/13/2009   migraines  . Complication of anesthesia    pt states she just needs a small amount or she will sleep too long  . Dysplasia of cervix, low grade (CIN 1)   . Fibroid   . GERD (gastroesophageal reflux disease)   . HEART MURMUR, SYSTOLIC 54/65/6812   will have echo 06/18/2013  . 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 Irritable bowel syndrome 12/23/2008  . Migraine   . Ovarian cyst   . Ovarian cyst   . THYROID NODULE, RIGHT 04/21/2009    Past Surgical History:  Procedure Laterality Date  . ABDOMINAL HYSTERECTOMY    . COLONOSCOPY    . HYSTEROSCOPY    . LAPAROSCOPY    .  NOVASURE ABLATION    . POLYPECTOMY    . TUBAL LIGATION    . UPPER GI ENDOSCOPY    . VAGINAL HYSTERECTOMY N/A 06/24/2013   Procedure: TOTAL HYSTERECTOMY VAGINAL;  Surgeon: Alwyn Pea, MD;  Location: [facility];  Service: Gynecology;  Laterality: N/A;    Family History  Problem Relation Age of Onset  . Goiter Mother   . Heart disease Mother   . Thyroid disease Mother   . Heart attack Mother   . Graves' disease Sister   . Asthma Sister   . Cancer Father   . Heart disease Father   . Heart attack Father   . CVA Brother   . Hypertension Brother   . Hypertension Brother   . Alcoholism Brother   . Stomach cancer  Sister   . Hepatitis Sister   . Stomach cancer Sister   . Goiter Other        Siblings 2  . Hypertension Paternal Grandfather   . Depression Neg Hx   . Alcohol abuse Neg Hx   . Drug abuse Neg Hx   . Early death Neg Hx   . Hearing loss Neg Hx   . Kidney disease Neg Hx   . Stroke Neg Hx     Social History   Tobacco Use  . Smoking status: Never Smoker  . Smokeless tobacco: Never Used  Substance Use Topics  . Alcohol use: No    Comment: occ    Subjective:  Has been feeling increased sensation of burping/ belching x 1 week; does take Protonix 40 mg bid; saw her GI last week and exam there was unremarkable; Denies any shortness of breath on exertion, blurred vision or headache   Objective:  Vitals:   04/29/20 1114  BP: 140/80  Pulse: 86  Temp: 98.6 F (37 C)  TempSrc: Oral  SpO2: 97%  Weight: 134 lb 4 oz (60.9 kg)  Height: 5' (1.524 m)    General: Well developed, well nourished, in no acute distress  Skin : Warm and dry.  Head: Normocephalic and atraumatic  Lungs: Respirations unlabored; clear to auscultation bilaterally without wheeze, rales, rhonchi  CVS exam: normal rate and regular rhythm.  Abdomen: Soft; nontender; nondistended; normoactive bowel sounds; no masses or hepatosplenomegaly  Musculoskeletal: No deformities; no active joint inflammation  Extremities: No edema, cyanosis, clubbing  Vessels: Symmetric bilaterally  Neurologic: Alert and oriented; speech intact; face symmetrical; moves all extremities well; CNII-XII intact without focal deficit   Assessment:  1. Atypical chest pain   2. Abdominal pain, unspecified abdominal location     Plan:  EKG shows NSR; update CXR today; will also update abdominal ultrasound- ? Gallbladder disease; follow up to be determined- may need to refer back to GI for endoscopy if symptoms persist.  This visit occurred during the SARS-CoV-2 public health emergency.  Safety protocols were in place, including screening questions  prior to the visit, additional usage of staff PPE, and extensive cleaning of exam room while observing appropriate contact time as indicated for disinfecting solutions.     No follow-ups on file.  Orders Placed This Encounter  Procedures  . DG Chest 2 View    Standing Status:   Future    Number of Occurrences:   1    Standing Expiration Date:   04/29/2021    Order Specific Question:   Reason for Exam (SYMPTOM  OR DIAGNOSIS REQUIRED)    Answer:   atypical chest pain    Order Specific  Question:   Is patient pregnant?    Answer:   No    Order Specific Question:   Preferred imaging location?    Answer:   [facility]    Order Specific Question:   Radiology Contrast Protocol - do NOT remove file path    Answer:   [email or web address]  . US Abdomen Complete    Standing Status:   Future    Standing Expiration Date:   04/29/2021    Order Specific Question:   Reason for Exam (SYMPTOM  OR DIAGNOSIS REQUIRED)    Answer:   abdominal pain    Order Specific Question:   Preferred imaging location?    Answer:   [facility]  . EKG 12-Lead    Requested Prescriptions    No prescriptions requested or ordered in this encounter

## 2020-04-30 ENCOUNTER — Other Ambulatory Visit: Payer: 59

## 2020-05-09 ENCOUNTER — Ambulatory Visit
Admission: RE | Admit: 2020-05-09 | Discharge: 2020-05-09 | Disposition: A | Discharge status: Home / Self Care | Payer: 59 | Source: Ambulatory Visit | Attending: Family | Admitting: Family

## 2020-05-09 DIAGNOSIS — R109 Unspecified abdominal pain: Secondary | ICD-10-CM | POA: Diagnosis not present

## 2020-05-10 MED FILL — UBRELVY 50 MG TABS: 50 | 30 days supply | Qty: 10 | Fill #1

## 2020-05-25 ENCOUNTER — Ambulatory Visit
Admission: RE | Admit: 2020-05-25 | Discharge: 2020-05-25 | Disposition: A | Discharge status: Home / Self Care | Payer: 59 | Source: Ambulatory Visit | Attending: Neurology | Admitting: Neurology

## 2020-05-25 ENCOUNTER — Other Ambulatory Visit: Payer: Self-pay

## 2020-05-25 DIAGNOSIS — M5124 Other intervertebral disc displacement, thoracic region: Secondary | ICD-10-CM | POA: Diagnosis not present

## 2020-05-25 DIAGNOSIS — R9389 Abnormal findings on diagnostic imaging of other specified body structures: Secondary | ICD-10-CM

## 2020-05-25 MED ORDER — GADOBENATE DIMEGLUMINE 529 MG/ML IV SOLN
15.0000 mL | Freq: Once | INTRAVENOUS | Status: AC | PRN
  Administered 2020-05-25: 15 mL via INTRAVENOUS

## 2020-05-27 ENCOUNTER — Telehealth: Payer: Self-pay

## 2020-05-27 NOTE — Telephone Encounter (Signed)
Called patient and informed her of results. Patient verbalized understanding. 

## 2020-05-27 NOTE — Telephone Encounter (Signed)
-----   Message from Pieter Partridge, DO sent at 05/26/2020 12:14 PM EDT ----- The spot seen on the T6 vertebral body appears to be just bone marrow.  It is benign.

## 2020-06-06 MED FILL — SYNTHROID 75 MCG TABLET: 75 | 90 days supply | Qty: 90 | Fill #0

## 2020-06-13 NOTE — Progress Notes (Signed)
NEUROLOGY FOLLOW UP OFFICE NOTE  Chelsey Hunt [id number]  HISTORY OF PRESENT ILLNESS: Chelsey Hunt is a 54year old right-handed woman with hypothyroidism who follows up for migraines.  UPDATE: Stopped Ajovy in June and was prescribed Vyepti.  However, it was not approved by her insurance. She has had at 7 to 10 migraines in September, lasting a day.  She is using [place] instead of Ultracet.  Does not repeat dose.  To further evaluate abnormal white matter on brain MRI, she had MRI of cervical and thoracic spine without contrast on 03/26/2020 which personally reviewed showed no cord lesions but did show incidental 1 cm T1 and T2 dark ovoid lesion in the T6 vertebral body.  Follow up MRI thoracic spine with contrast on 05/25/2020 showed no change and no enhancement, thought to represent benign marrow focus .  She was evaluated by ophthalmology in September which demonstrated dry eye but no concerning findings such as optic neuritis.  Current NSAIDS: ASA 81mg  Current analgesics: none Current triptans:None Current ergotamine: None Current abortive anti-CGRP:none Current anti-emetic: Promethazine Current muscle relaxants: Cyclobenzaprine 10 mg Current anti-anxiolytic: None Current sleep aide: None Current Antihypertensive medications: None Current Antidepressant medications:none Current Anticonvulsant medications:gabapentin 100mg  QHS (for scalp neuralgia; cannot tolerate higher doses) Current anti-CGRP:Ubrelvy 100mg  Current Vitamins/Herbal/Supplements:Co Q-10; turmeric Current Antihistamines/Decongestants: None Other therapy: Physical therapy for neck pain; dry needling Hormone/birth control: None  Caffeine: No Diet: 5-6 bottles of water daily Exercise: Not routine Depression: No; Anxiety: Yes Other pain:neck pain. She has cervical spondylosis Sleep hygiene: Poor  HISTORY: Onset:First migraine was between36 and 54 years old. Migraine free until 2004  during her menstrual cycle. Chronic for many years. Location:Left more than right. Quality:throbbing Initial intensity:Severe.  Aura:no Prodrome:no Postdrome:fatigue Associated symptoms:Right facial tingling,nausea, photophobia, phonophobia, osmophobia.Shedenies associated vomiting, visual disturbance orunilateral numbness or weakness. Initial duration:3 days InitialFrequency:daily InitialFrequency of abortive medication:Advil or Tylenol daily, Maxalt 2 days a week Triggers: Certain smells, change in barometric pressure, sausages, certain cheese, bananas; brazilian nutes, COVID-19 (contracted in September 2020) Relieving factors: Exercise, staying hydrated Activity:aggravates  During summer 2019, she has had throbbing occipital headache, right-sided neck pain radiating up to the occiput, a burningscalp neuralgia,associated with tinnitus, sometimesleftor right sidedfacial numbness, facial twitching. She started having a panic attack with palpitations. She went to the ED on 06/26/18 for symptoms. EKG showed sinus tachycardia. She was treated with headache cocktail.  To evaluate worsening headaches, shehad an MRI of the brain on 11/21/2018 which demonstrated nonspecific white matter hyperintensities in the cerebral white matter, likely chronic small vessel ischemic changes, but no acute abnormality.For further evaluation of neck pain, cervical spine X-ray on 04/10/2019 showed slight refersal of the normal cervical lordosis with minimal retrolisthesis of C5 on C6 and cervical spondylosis with osteophytes most notable at C4-5 and C5-6.  She has bilateral tinnitus.  Exam by ENT unremarkable.  Past NSAIDS:Ibuprofen, naproxen, Cambia Past analgesics:Excedrin, Lidocaine nasal Past abortive triptans:Relpax, sumatriptantablet, Maxalt, Tosymra NS, Zembrace. Past abortive ergotamine:Migranal Past muscle relaxants:none Past anti-emetic:Zofran Past  antihypertensive medications:Verapamil, propranolol Past antidepressant medications:Imipramine, nortriptyline Past anticonvulsant medications:topiramate Past anti-CGRP:Aimovig (aggravated migraine), Emgality (ineffective), Ajovy, Nurtec Past vitamins/Herbal/Supplements:Riboflavin, magnesium Past antihistamines/decongestants:None Other past therapies:acupuncture (triggered migraine), Reyvow  Family history of headache:Mom (migraines), daughter (migraines)  MRI and MRA of the head from 03/09/2009 revealed nonspecific small subcortical white matter hyperintensities and 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  MRI of the brain on 11/21/2018 which demonstrated nonspecific white matter hyperintensities in the cerebral white matter.  To evaluate new visual aura with migraine (hair floating in her vision), MRI of brain with and without contrast performed on 02/10/2020 again demonstrated non-enhancing white matter T2 and FLAIR signal in the cerebral hemispheres, stable compared to prior imaging from 2020.  PAST MEDICAL HISTORY: Past Medical History:  Diagnosis Date  . ACID REFLUX DISEASE 01/03/2008  . Anemia   . ANEMIA, IRON DEFICIENCY 12/23/2008   history  . Anxiety   . Chronic tension type headache 01/13/2009   migraines  . Complication of anesthesia    pt states she just needs a small amount or she will sleep too long  . Dysplasia of cervix, low grade (CIN 1)   . Fibroid   . GERD (gastroesophageal reflux disease)   . HEART MURMUR, SYSTOLIC 07/37/1062   will have echo 06/18/2013  . 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 Irritable bowel syndrome 12/23/2008  . Migraine   . Ovarian cyst   . Ovarian cyst   . THYROID NODULE, RIGHT 04/21/2009    MEDICATIONS: Current Outpatient Medications on File Prior to Visit  Medication Sig Dispense Refill  . acetaminophen (TYLENOL) 325 MG tablet Take 650 mg by mouth every 6 (six) hours as needed for  mild pain.    Marland Kitchen AJOVY 225 MG/1.5ML SOAJ INJECT 225 MG INTO THE SKIN EVERY 30 (THIRTY) DAYS. 1.5 mL 11  . co-enzyme Q-10 30 MG capsule Take 200 mg by mouth 3 (three) times daily.    . cyclobenzaprine (FLEXERIL) 5 MG tablet Take 1 tablet (5 mg total) by mouth 3 (three) times daily as needed for muscle spasms. 30 tablet 1  . diazepam (VALIUM) 10 MG tablet Take 60 minutes prior to MRI 1 tablet 0  . dicyclomine (BENTYL) 10 MG capsule Take by mouth.    . gabapentin (NEURONTIN) 100 MG capsule Take 1 capsule (100 mg total) by mouth at bedtime. 30 capsule 5  . ibuprofen (ADVIL,MOTRIN) 200 MG tablet Take 200 mg by mouth every 8 (eight) hours as needed.     . pantoprazole (PROTONIX) 40 MG tablet Take 40 mg by mouth 2 (two) times daily.    . promethazine (PHENERGAN) 25 MG tablet Take 1 tablet (25 mg total) by mouth every 6 (six) hours as needed for nausea or vomiting. 30 tablet 5  . SYNTHROID 75 MCG tablet Take 1 tablet (75 mcg total) by mouth daily. 90 tablet 3  . traMADol-acetaminophen (ULTRACET) 37.5-325 MG tablet Take 1 tablet by mouth every 6 (six) hours as needed. 15 tablet 0  . Ubrogepant (UBRELVY) 50 MG TABS Take 1 tablet for migraine.  May repeat in 2 hours if needed.  Maximum 2 tablets in 24 hours. 10 tablet 5   No current facility-administered medications on file prior to visit.    ALLERGIES: Allergies  Allergen Reactions  . Imitrex [Sumatriptan] Palpitations  . Propoxyphene N-Acetaminophen   . Nabumetone Rash    REACTION: GI upset  . Topiramate Rash    REACTION: rash    FAMILY HISTORY: Family History  Problem Relation Age of Onset  . Goiter Mother   . Heart disease Mother   . Thyroid disease Mother   . Heart attack Mother   . Graves' disease Sister   . Asthma Sister   . Cancer Father   . Heart disease Father   . Heart attack Father   . CVA Brother   . Hypertension Brother   . Hypertension Brother   . Alcoholism Brother   . Stomach cancer Sister   .  Hepatitis Sister   .  Stomach cancer Sister   . Goiter Other        Siblings 2  . Hypertension Paternal Grandfather   . Depression Neg Hx   . Alcohol abuse Neg Hx   . Drug abuse Neg Hx   . Early death Neg Hx   . Hearing loss Neg Hx   . Kidney disease Neg Hx   . Stroke Neg Hx     SOCIAL HISTORY: Social History   Socioeconomic History  . Marital status: Married    Spouse name: Shanon Brow  . Number of children: 3  . Years of education: Not on file  . Highest education level: Associate degree: occupational, Hotel manager, or vocational program  Occupational History  . Occupation: WOMENS    Employer: [employer]  Tobacco Use  . Smoking status: Never Smoker  . Smokeless tobacco: Never Used  Vaping Use  . Vaping Use: Never used  Substance and Sexual Activity  . Alcohol use: No    Comment: occ  . Drug use: No  . Sexual activity: Yes    Birth control/protection: Surgical    Comment: BTL  Other Topics Concern  . Not on file  Social History Narrative   Regular exercise-yes      Patient is right-handed.She lives with her husband in a 1 story home. She has recently been avoiding caffeine. Walks.   Social Determinants of Health   Financial Resource Strain:   . Difficulty of Paying Living Expenses: Not on file  Food Insecurity:   . Worried About Charity fundraiser in the Last Year: Not on file  . Ran Out of Food in the Last Year: Not on file  Transportation Needs:   . Lack of Transportation (Medical): Not on file  . Lack of Transportation (Non-Medical): Not on file  Physical Activity:   . Days of Exercise per Week: Not on file  . Minutes of Exercise per Session: Not on file  Stress:   . Feeling of Stress : Not on file  Social Connections:   . Frequency of Communication with Friends and Family: Not on file  . Frequency of Social Gatherings with Friends and Family: Not on file  . Attends Religious Services: Not on file  . Active Member of Clubs or Organizations: Not on file  . Attends Theatre manager Meetings: Not on file  . Marital Status: Not on file  Intimate Partner Violence:   . Fear of Current or Ex-Partner: Not on file  . Emotionally Abused: Not on file  . Physically Abused: Not on file  . Sexually Abused: Not on file    PHYSICAL EXAM: Blood pressure 122/79, pulse 88, height 5' (1.524 m), weight 137 lb 3.2 oz (62.2 kg), last menstrual period 04/20/2013, SpO2 99 %. General: No acute distress.  Patient appears well-groomed.   Head:  Normocephalic/atraumatic Eyes:  Fundi examined but not visualized Neck: supple, no paraspinal tenderness, full range of motion Heart:  Regular rate and rhythm Lungs:  Clear to auscultation bilaterally Back: No paraspinal tenderness Neurological Exam: alert and oriented to person, place, and time. Attention span and concentration intact, recent and remote memory intact, fund of knowledge intact.  Speech fluent and not dysarthric, language intact.  CN II-XII intact. Bulk and tone normal, muscle strength 5/5 throughout.  Sensation to light touch, temperature and vibration intact.  Deep tendon reflexes 2+ throughout, toes downgoing.  Finger to nose and heel to shin testing intact.  Gait normal, Romberg  negative.  IMPRESSION: 1.  Migraine with and without aura 2.  Abnormal white matter on brain.  No lesions involving spinal cord.  Likely chronic cerebrovascular disease  PLAN: 1.  Will resubmit authorization for Vyepti.  She has failed multiple preventatives including antihypertensives (propranolol, verapamil), antidepressants (nortriptyline, imipramine), antiepileptics (topiramate, and CGRP inhibitors (Aimovig, Emgality, Ajovy).  She does not meet criteria of chronic migraine for Botox. 2.  [name] for abortive therapy.  Triptans contraindicated given cerebrovascular disease. 3.  Limit use of pain relievers to no more than 2 days out of week to prevent risk of rebound or medication-overuse headache. 4.  Headache diary 5.  Follow up in 6  months.  Metta Clines, DO  CC: Marrian Salvage, FNP

## 2020-06-14 ENCOUNTER — Ambulatory Visit (INDEPENDENT_AMBULATORY_CARE_PROVIDER_SITE_OTHER): Payer: 59 | Admitting: Neurology

## 2020-06-14 ENCOUNTER — Encounter: Payer: Self-pay | Admitting: Neurology

## 2020-06-14 ENCOUNTER — Other Ambulatory Visit: Payer: Self-pay

## 2020-06-14 VITALS — BP 122/79 | HR 88 | Ht 60.0 in | Wt 137.2 lb

## 2020-06-14 DIAGNOSIS — G43109 Migraine with aura, not intractable, without status migrainosus: Secondary | ICD-10-CM

## 2020-06-14 DIAGNOSIS — R9082 White matter disease, unspecified: Secondary | ICD-10-CM

## 2020-06-14 NOTE — Patient Instructions (Signed)
1.  Will still try to get Vyepti approved 2.  Continue Ubrelvy as directed 3.  Limit use of pain relievers to no more than 2 days out of week to prevent risk of rebound or medication-overuse headache. 4.  Headache diary 5.  Follow up in 6 months.

## 2020-06-14 NOTE — Progress Notes (Addendum)
[name] Key: [id number] - PA Case ID: [id number] help? Call us at [phone] Outcome Approvedtoday The request has been approved. The authorization is effective for a maximum of 2 fills from 06/14/2020 to 12/12/2020, as long as the member is enrolled in their current health plan. The request was approved as submitted. This request has been approved for up to 3 mL per 3 months. Please note: Renewal requires that the patient has experienced a reduction in migraine or headache frequency of at least 2 days per month OR the patient has experienced a reduction in migraine severity OR migraine duration with Vyepti therapy. A written notification letter will follow with additional details. Drug Vyepti 100MG /ML solution Form MedImpact ePA Form 2017 NCPDP

## 2020-06-20 NOTE — Progress Notes (Signed)
Letter recevied from [facility]:  Pt has to use [facility] [employer] for her Vyepti.  [phone].

## 2020-06-22 ENCOUNTER — Other Ambulatory Visit: Payer: Self-pay

## 2020-06-28 ENCOUNTER — Telehealth: Payer: Self-pay | Admitting: Neurology

## 2020-06-28 NOTE — Telephone Encounter (Signed)
Telephone call to pt, [facility] short stay number given to pt.

## 2020-07-05 MED FILL — UBRELVY 50 MG TABS: 50 | 30 days supply | Qty: 10 | Fill #2

## 2020-07-05 MED FILL — PROMETHAZINE 25 MG TABLET: 25 | 8 days supply | Qty: 30 | Fill #1

## 2020-07-18 ENCOUNTER — Encounter: Payer: Self-pay | Admitting: Family

## 2020-07-20 ENCOUNTER — Encounter: Payer: Self-pay | Admitting: Family

## 2020-07-20 ENCOUNTER — Other Ambulatory Visit: Payer: Self-pay

## 2020-07-20 ENCOUNTER — Ambulatory Visit (INDEPENDENT_AMBULATORY_CARE_PROVIDER_SITE_OTHER): Payer: 59 | Admitting: Family

## 2020-07-20 VITALS — BP 116/78 | HR 92 | Temp 98.6°F | Ht 60.0 in | Wt 138.0 lb

## 2020-07-20 DIAGNOSIS — T148XXA Other injury of unspecified body region, initial encounter: Secondary | ICD-10-CM

## 2020-07-20 NOTE — Progress Notes (Signed)
Chelsey Hunt is a 54 y.o. female with the following history as recorded in EpicCare:  Patient Active Problem List   Diagnosis Date Noted  . Raynaud phenomenon 09/17/2019  . Hand cramps 09/17/2019  . Carpal tunnel syndrome on left 09/17/2019  . Anxiety 07/30/2019  . Rash and nonspecific skin eruption 07/01/2019  . Musculoskeletal strain 03/25/2019  . Visit for screening mammogram 03/04/2019  . Chronic idiopathic constipation 01/08/2019  . Essential hypertension 10/24/2018  . Psychophysiological insomnia 07/07/2018  . Seasonal allergic rhinitis due to pollen 08/08/2017  . Intractable migraine without aura and without status migrainosus 07/02/2016  . Hyperglycemia 11/28/2015  . Pancreas divisum of native pancreas 12/27/2014  . Routine general medical examination at a health care facility 07/30/2012  . GERD (gastroesophageal reflux disease)   . Hypothyroidism 08/12/2009    Current Outpatient Medications  Medication Sig Dispense Refill  . acetaminophen (TYLENOL) 325 MG tablet Take 650 mg by mouth every 6 (six) hours as needed for mild pain.    Marland Kitchen AJOVY 225 MG/1.5ML SOAJ INJECT 225 MG INTO THE SKIN EVERY 30 (THIRTY) DAYS. 1.5 mL 11  . co-enzyme Q-10 30 MG capsule Take 200 mg by mouth 3 (three) times daily.    . cyclobenzaprine (FLEXERIL) 5 MG tablet Take 1 tablet (5 mg total) by mouth 3 (three) times daily as needed for muscle spasms. 30 tablet 1  . dicyclomine (BENTYL) 10 MG capsule Take by mouth.    . gabapentin (NEURONTIN) 100 MG capsule Take 1 capsule (100 mg total) by mouth at bedtime. 30 capsule 5  . ibuprofen (ADVIL,MOTRIN) 200 MG tablet Take 200 mg by mouth every 8 (eight) hours as needed.     . pantoprazole (PROTONIX) 40 MG tablet Take 40 mg by mouth 2 (two) times daily.    . promethazine (PHENERGAN) 25 MG tablet Take 1 tablet (25 mg total) by mouth every 6 (six) hours as needed for nausea or vomiting. 30 tablet 5  . SYNTHROID 75 MCG tablet Take 1 tablet (75 mcg total) by  mouth daily. 90 tablet 3  . traMADol-acetaminophen (ULTRACET) 37.5-325 MG tablet Take 1 tablet by mouth every 6 (six) hours as needed. 15 tablet 0  . Ubrogepant (UBRELVY) 50 MG TABS Take 1 tablet for migraine.  May repeat in 2 hours if needed.  Maximum 2 tablets in 24 hours. 10 tablet 5   No current facility-administered medications for this visit.    Allergies: Imitrex [sumatriptan], Propoxyphene n-acetaminophen, Nabumetone, and Topiramate  Past Medical History:  Diagnosis Date  . ACID REFLUX DISEASE 01/03/2008  . Anemia   . ANEMIA, IRON DEFICIENCY 12/23/2008   history  . Anxiety   . Chronic tension type headache 01/13/2009   migraines  . Complication of anesthesia    pt states she just needs a small amount or she will sleep too long  . Dysplasia of cervix, low grade (CIN 1)   . Fibroid   . GERD (gastroesophageal reflux disease)   . HEART MURMUR, SYSTOLIC 28/76/8115   will have echo 06/18/2013  . 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 Irritable bowel syndrome 12/23/2008  . Migraine   . Ovarian cyst   . Ovarian cyst   . THYROID NODULE, RIGHT 04/21/2009    Past Surgical History:  Procedure Laterality Date  . ABDOMINAL HYSTERECTOMY    . COLONOSCOPY    . HYSTEROSCOPY    . LAPAROSCOPY    . NOVASURE ABLATION    . POLYPECTOMY    . TUBAL LIGATION    .  UPPER GI ENDOSCOPY    . VAGINAL HYSTERECTOMY N/A 06/24/2013   Procedure: TOTAL HYSTERECTOMY VAGINAL;  Surgeon: Alwyn Pea, MD;  Location: [facility];  Service: Gynecology;  Laterality: N/A;    Family History  Problem Relation Age of Onset  . Goiter Mother   . Heart disease Mother   . Thyroid disease Mother   . Heart attack Mother   . Graves' disease Sister   . Asthma Sister   . Cancer Father   . Heart disease Father   . Heart attack Father   . CVA Brother   . Hypertension Brother   . Hypertension Brother   . Alcoholism Brother   . Stomach cancer Sister   . Hepatitis Sister   . Stomach cancer Sister   . Goiter Other         Siblings 2  . Hypertension Paternal Grandfather   . Depression Neg Hx   . Alcohol abuse Neg Hx   . Drug abuse Neg Hx   . Early death Neg Hx   . Hearing loss Neg Hx   . Kidney disease Neg Hx   . Stroke Neg Hx     Social History   Tobacco Use  . Smoking status: Never Smoker  . Smokeless tobacco: Never Used  Substance Use Topics  . Alcohol use: No    Comment: occ    Subjective:  Concerned about asymmetry of upper back muscles; daughter was concerned that there was a "lump." No pain; is right-handed and does most of her work with her right hand;   Objective:  Vitals:   07/20/20 1049  BP: 116/78  Pulse: 92  Temp: 98.6 F (37 C)  TempSrc: Oral  SpO2: 99%  Weight: 138 lb (62.6 kg)  Height: 5' (1.524 m)    General: Well developed, well nourished, in no acute distress  Skin : Warm and dry.  Head: Normocephalic and atraumatic  Lungs: Respirations unlabored;  Musculoskeletal: No deformities; no active joint inflammation; right scapular muscle is larger than left scapular muscle;  Extremities: No edema, cyanosis, clubbing  Vessels: Symmetric bilaterally  Neurologic: Alert and oriented; speech intact; face symmetrical;   Assessment:  1. Musculoskeletal strain     Plan:  Reassurance there is mild asymmetry between muscles; patient readily admits she does more with right hand; no follow-up needed.   No follow-ups on file.  No orders of the defined types were placed in this encounter.   Requested Prescriptions    No prescriptions requested or ordered in this encounter

## 2020-07-28 ENCOUNTER — Encounter: Payer: Self-pay | Admitting: Family

## 2020-07-28 NOTE — Telephone Encounter (Signed)
Please advise 

## 2020-08-03 MED FILL — UBRELVY 50 MG TABS: 50 | 30 days supply | Qty: 10 | Fill #3

## 2020-08-19 DIAGNOSIS — D2371 Other benign neoplasm of skin of right lower limb, including hip: Secondary | ICD-10-CM | POA: Diagnosis not present

## 2020-08-19 DIAGNOSIS — L821 Other seborrheic keratosis: Secondary | ICD-10-CM | POA: Diagnosis not present

## 2020-08-19 DIAGNOSIS — D2261 Melanocytic nevi of right upper limb, including shoulder: Secondary | ICD-10-CM | POA: Diagnosis not present

## 2020-08-19 DIAGNOSIS — L309 Dermatitis, unspecified: Secondary | ICD-10-CM | POA: Diagnosis not present

## 2020-08-19 DIAGNOSIS — D2262 Melanocytic nevi of left upper limb, including shoulder: Secondary | ICD-10-CM | POA: Diagnosis not present

## 2020-08-19 DIAGNOSIS — L819 Disorder of pigmentation, unspecified: Secondary | ICD-10-CM | POA: Diagnosis not present

## 2020-08-19 DIAGNOSIS — D225 Melanocytic nevi of trunk: Secondary | ICD-10-CM | POA: Diagnosis not present

## 2020-08-19 MED FILL — TRIAMCIN 0.1%CR:SSD 1:1: 454 days supply | Qty: 454 | Fill #0

## 2020-08-23 ENCOUNTER — Other Ambulatory Visit (HOSPITAL_COMMUNITY): Payer: Self-pay | Admitting: Obstetrics and Gynecology

## 2020-08-23 DIAGNOSIS — Z113 Encounter for screening for infections with a predominantly sexual mode of transmission: Secondary | ICD-10-CM | POA: Diagnosis not present

## 2020-08-23 DIAGNOSIS — N9489 Other specified conditions associated with female genital organs and menstrual cycle: Secondary | ICD-10-CM | POA: Diagnosis not present

## 2020-08-23 MED FILL — valACYclovir HCL 1 GM TABS: 1 | 15 days supply | Qty: 30 | Fill #0

## 2020-08-30 ENCOUNTER — Other Ambulatory Visit (HOSPITAL_COMMUNITY): Payer: Self-pay

## 2020-08-30 MED FILL — PANTOPRAZOLE SOD DR 40 MG T: 40 | 90 days supply | Qty: 180 | Fill #0

## 2020-09-20 DIAGNOSIS — Z01419 Encounter for gynecological examination (general) (routine) without abnormal findings: Secondary | ICD-10-CM | POA: Diagnosis not present

## 2020-09-20 DIAGNOSIS — Z1231 Encounter for screening mammogram for malignant neoplasm of breast: Secondary | ICD-10-CM | POA: Diagnosis not present

## 2020-09-20 DIAGNOSIS — Z1211 Encounter for screening for malignant neoplasm of colon: Secondary | ICD-10-CM | POA: Diagnosis not present

## 2020-09-27 MED FILL — SYNTHROID 75 MCG TABLET: 75 | 90 days supply | Qty: 90 | Fill #1

## 2020-09-28 MED FILL — UBRELVY 50 MG TABS: 50 | 30 days supply | Qty: 10 | Fill #4

## 2020-09-29 ENCOUNTER — Encounter: Payer: Self-pay | Admitting: Neurology

## 2020-09-29 NOTE — Progress Notes (Signed)
Chelsey Hunt (Key: [id number]) Rx #: [id number] [name] 50MG  tablets   Form MedImpact ePA Form 2017 NCPDP Created 16 hours ago Sent to Plan 7 minutes ago Plan Response 7 minutes ago Submit Clinical Questions 6 minutes ago Determination Favorable 6 minutes ago Message from Plan The request has been approved. The authorization is effective for a maximum of 12 fills from 09/29/2020 to 09/28/2021, as long as the member is enrolled in their current health plan. This has been approved for a quantity limit of 16.0 with a day supply limit of 30.0. A written notification letter will follow with additional details.

## 2020-10-10 ENCOUNTER — Other Ambulatory Visit: Payer: Self-pay | Admitting: Neurology

## 2020-10-10 ENCOUNTER — Telehealth: Payer: Self-pay | Admitting: Neurology

## 2020-10-10 DIAGNOSIS — Z0279 Encounter for issue of other medical certificate: Secondary | ICD-10-CM

## 2020-10-10 MED ORDER — PREDNISONE 10 MG (21) PO TBPK
ORAL_TABLET | ORAL | 0 refills | Status: DC
Start: 2020-10-10 — End: 2020-12-28

## 2020-10-10 MED ORDER — UBRELVY 100 MG PO TABS: 1.0000 | ORAL_TABLET | ORAL | 5 refills | Status: DC | PRN

## 2020-10-10 MED FILL — predniSONE 10 MG TABS: 10 | 6 days supply | Qty: 21 | Fill #0

## 2020-10-10 MED FILL — UBRELVY 100 MG TABS: 100 | 30 days supply | Qty: 16 | Fill #0

## 2020-10-10 NOTE — Telephone Encounter (Signed)
Spoke to pt, Pt states she did not want to try the Vyepti. Pt states she was scared to start the infusion. Not sure of the side effects.  Pt did take the Ubrelvy twice last week. Pt reports the nausea was so bad this time, she never had this much nausea with a migraine.    Please advise

## 2020-10-10 NOTE — Telephone Encounter (Signed)
Pt wanted to know if she could go up on the Urbelvy to 100 mg since she only taking the 50 mg right now?    Pt will like to start Botox.

## 2020-10-10 NOTE — Telephone Encounter (Signed)
Started the PA process for her Botox

## 2020-10-10 NOTE — Telephone Encounter (Signed)
Prednisone 10 mg dose pack sent into pharmacy.

## 2020-10-10 NOTE — Telephone Encounter (Signed)
For intractable migraine, we can send her a prednisone taper.  Unfortunately, I have no other options for rescue therapy.  For preventative, we can try Botox.

## 2020-10-10 NOTE — Telephone Encounter (Signed)
LMOVM Urbelvy 100 mg sent into the pharmacy, We will start a PA for Botox and get you schedule once approved.

## 2020-10-10 NOTE — Telephone Encounter (Signed)
Yes, we can increase Ubrelvy to 100mg .  We will send for pre-auth regarding Botox.

## 2020-10-10 NOTE — Telephone Encounter (Signed)
Patient called and said, "I've had a headache since last Tuesday then yesterday it got worse with eye pain, floaters, and flashes of light in my head and eyes. Today my head is real tender to the touch. I'd like to be seen today, if possible."

## 2020-10-12 ENCOUNTER — Encounter: Payer: Self-pay | Admitting: Neurology

## 2020-10-12 NOTE — Progress Notes (Signed)
Chelsey Hunt Key: [id number] - PA Case ID: [id number] help? Call us at [phone] Outcome Approvedtoday The request has been approved. The authorization is effective for a maximum of 12 fills from 10/12/2020 to 10/11/2021, as long as the member is enrolled in their current health plan. This has been approved for a quantity limit of 1.5 with a day supply limit of 30.0. A written notification letter will follow with additional details. Drug AJOVY (fremanezumab-vfrm) injection 225MG /1.5ML auto-injectors Form MedImpact ePA Form 2017 NCPDP

## 2020-10-28 ENCOUNTER — Encounter: Payer: Self-pay | Admitting: Neurology

## 2020-10-28 NOTE — Progress Notes (Signed)
Per BV- Botox is covered through [place] and [place]. Sent BV to scanning for her chart.

## 2020-10-31 DIAGNOSIS — N941 Unspecified dyspareunia: Secondary | ICD-10-CM | POA: Diagnosis not present

## 2020-10-31 DIAGNOSIS — N898 Other specified noninflammatory disorders of vagina: Secondary | ICD-10-CM | POA: Diagnosis not present

## 2020-11-06 ENCOUNTER — Other Ambulatory Visit: Payer: Self-pay

## 2020-11-06 ENCOUNTER — Encounter (HOSPITAL_COMMUNITY): Payer: Self-pay

## 2020-11-06 ENCOUNTER — Ambulatory Visit (HOSPITAL_COMMUNITY)
Admission: EM | Admit: 2020-11-06 | Discharge: 2020-11-06 | Disposition: A | Discharge status: Home / Self Care | Payer: 59 | Attending: Family Medicine | Admitting: Family Medicine

## 2020-11-06 DIAGNOSIS — R21 Rash and other nonspecific skin eruption: Secondary | ICD-10-CM

## 2020-11-06 MED ORDER — CLOBETASOL PROPIONATE 0.05 % EX OINT: 1.0000 "application " | TOPICAL_OINTMENT | Freq: Two times a day (BID) | CUTANEOUS | 0 refills | Status: DC

## 2020-11-06 NOTE — ED Provider Notes (Signed)
[place]    CSN: [id number] Arrival date & time: 11/06/20  1002      History   Chief Complaint Chief Complaint  Patient presents with  . Rash    HPI Chelsey Hunt is a 55 y.o. female.   Patient presenting today for evaluation of itchy red areas that have appeared on her legs since yesterday.  She states she has 2-3 areas on the right lower leg and one area developing on the left lower leg and is not aware of any new exposures or injury to these areas.  Denies fever, chills, body aches, arthralgias, recent outdoor activity, pets in the home.  Does not have any chronic dermatologic issues that she is aware of.  Just bought some cortisone cream and Benadryl but has not started these yet.     Past Medical History:  Diagnosis Date  . ACID REFLUX DISEASE 01/03/2008  . Anemia   . ANEMIA, IRON DEFICIENCY 12/23/2008   history  . Anxiety   . Chronic tension type headache 01/13/2009   migraines  . Complication of anesthesia    pt states she just needs a small amount or she will sleep too long  . Dysplasia of cervix, low grade (CIN 1)   . Fibroid   . GERD (gastroesophageal reflux disease)   . HEART MURMUR, SYSTOLIC 82/99/3716   will have echo 06/18/2013  . 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 Irritable bowel syndrome 12/23/2008  . Migraine   . Ovarian cyst   . Ovarian cyst   . THYROID NODULE, RIGHT 04/21/2009    Patient Active Problem List   Diagnosis Date Noted  . Raynaud phenomenon 09/17/2019  . Hand cramps 09/17/2019  . Carpal tunnel syndrome on left 09/17/2019  . Anxiety 07/30/2019  . Rash and nonspecific skin eruption 07/01/2019  . Musculoskeletal strain 03/25/2019  . Visit for screening mammogram 03/04/2019  . Chronic idiopathic constipation 01/08/2019  . Essential hypertension 10/24/2018  . Psychophysiological insomnia 07/07/2018  . Seasonal allergic rhinitis due to pollen 08/08/2017  . Intractable migraine without aura and without status  migrainosus 07/02/2016  . Hyperglycemia 11/28/2015  . Pancreas divisum of native pancreas 12/27/2014  . Routine general medical examination at a health care facility 07/30/2012  . GERD (gastroesophageal reflux disease)   . Hypothyroidism 08/12/2009    Past Surgical History:  Procedure Laterality Date  . ABDOMINAL HYSTERECTOMY    . COLONOSCOPY    . HYSTEROSCOPY    . LAPAROSCOPY    . NOVASURE ABLATION    . POLYPECTOMY    . TUBAL LIGATION    . UPPER GI ENDOSCOPY    . VAGINAL HYSTERECTOMY N/A 06/24/2013   Procedure: TOTAL HYSTERECTOMY VAGINAL;  Surgeon: Alwyn Pea, MD;  Location: [facility];  Service: Gynecology;  Laterality: N/A;    OB History    Gravida  3   Para  3   Term      Preterm      AB      Living  3     SAB      IAB      Ectopic      Multiple      Live Births  3            Home Medications    Prior to Admission medications   Medication Sig Start Date End Date Taking? Authorizing Provider  clobetasol ointment (TEMOVATE) 9.67 % Apply 1 application topically 2 (two) times daily. 11/06/20  Yes Volney American, PA-C  acetaminophen (TYLENOL) 325 MG tablet Take 650 mg by mouth every 6 (six) hours as needed for mild pain.    [provider]  AJOVY 225 MG/1.5ML SOAJ INJECT 225 MG INTO THE SKIN EVERY 30 (THIRTY) DAYS. 02/17/20   Pieter Partridge, DO  co-enzyme Q-10 30 MG capsule Take 200 mg by mouth 3 (three) times daily.    [provider]  cyclobenzaprine (FLEXERIL) 5 MG tablet Take 1 tablet (5 mg total) by mouth 3 (three) times daily as needed for muscle spasms. 04/10/19   Marrian Salvage, FNP  dicyclomine (BENTYL) 10 MG capsule Take by mouth. 04/20/20 04/20/21  [provider]  gabapentin (NEURONTIN) 100 MG capsule Take 1 capsule (100 mg total) by mouth at bedtime. 10/12/19   Pieter Partridge, DO  ibuprofen (ADVIL,MOTRIN) 200 MG tablet Take 200 mg by mouth every 8 (eight) hours as needed.     [provider]   pantoprazole (PROTONIX) 40 MG tablet Take 40 mg by mouth 2 (two) times daily.    [provider]  predniSONE (STERAPRED UNI-PAK 21 TAB) 10 MG (21) TBPK tablet take 60mg  day 1, then 50mg  day 2, then 40mg  day 3, then 30mg  day 4, then 20mg  day 5, then 10mg  day 6, then STOP 10/10/20   Jaffe, Adam R, DO  promethazine (PHENERGAN) 25 MG tablet Take 1 tablet (25 mg total) by mouth every 6 (six) hours as needed for nausea or vomiting. 10/12/19   Jaffe, Adam R, DO  SYNTHROID 75 MCG tablet Take 1 tablet (75 mcg total) by mouth daily. 03/08/20   Marrian Salvage, FNP  traMADol-acetaminophen (ULTRACET) 37.5-325 MG tablet Take 1 tablet by mouth every 6 (six) hours as needed. 02/22/20   Tomi Likens, Adam R, DO  Ubrogepant (UBRELVY) 100 MG TABS Take 1 tablet by mouth as needed (May repeat in 2 hours.  Maximum 2 tablets in 24 hours.). 10/10/20   Pieter Partridge, DO    Family History Family History  Problem Relation Age of Onset  . Goiter Mother   . Heart disease Mother   . Thyroid disease Mother   . Heart attack Mother   . Graves' disease Sister   . Asthma Sister   . Cancer Father   . Heart disease Father   . Heart attack Father   . CVA Brother   . Hypertension Brother   . Hypertension Brother   . Alcoholism Brother   . Stomach cancer Sister   . Hepatitis Sister   . Stomach cancer Sister   . Goiter Other        Siblings 2  . Hypertension Paternal Grandfather   . Depression Neg Hx   . Alcohol abuse Neg Hx   . Drug abuse Neg Hx   . Early death Neg Hx   . Hearing loss Neg Hx   . Kidney disease Neg Hx   . Stroke Neg Hx     Social History Social History   Tobacco Use  . Smoking status: Never Smoker  . Smokeless tobacco: Never Used  Vaping Use  . Vaping Use: Never used  Substance Use Topics  . Alcohol use: No    Comment: occ  . Drug use: No     Allergies   Imitrex [sumatriptan], Propoxyphene n-acetaminophen, Nabumetone, and Topiramate   Review of Systems Review of Systems Per  HPI  Physical Exam Triage Vital Signs ED Triage Vitals  Enc Vitals Group     BP 11/06/20 1018 (!) 144/76  Pulse Rate 11/06/20 1018 (!) 116     Resp 11/06/20 1018 18     Temp 11/06/20 1018 97.9 F (36.6 C)     Temp Source 11/06/20 1018 Oral     SpO2 11/06/20 1018 100 %     Weight --      Height --      Head Circumference --      Peak Flow --      Pain Score 11/06/20 1019 3     Pain Loc --      Pain Edu? --      Excl. in [place]? --    No data found.  Updated Vital Signs BP (!) 144/76 (BP Location: Right Arm)   Pulse (!) 116   Temp 97.9 F (36.6 C) (Oral)   Resp 18   LMP 04/20/2013   SpO2 100%   Visual Acuity Right Eye Distance:   Left Eye Distance:   Bilateral Distance:    Right Eye Near:   Left Eye Near:    Bilateral Near:     Physical Exam Vitals and nursing note reviewed.  Constitutional:      Appearance: Normal appearance. She is not ill-appearing.  HENT:     Head: Atraumatic.     Mouth/Throat:     Mouth: Mucous membranes are moist.     Pharynx: Oropharynx is clear.  Eyes:     Extraocular Movements: Extraocular movements intact.     Conjunctiva/sclera: Conjunctivae normal.  Cardiovascular:     Rate and Rhythm: Normal rate and regular rhythm.     Heart sounds: Normal heart sounds.  Pulmonary:     Effort: Pulmonary effort is normal. No respiratory distress.     Breath sounds: Normal breath sounds. No wheezing or rales.  Musculoskeletal:        General: Normal range of motion.     Cervical back: Normal range of motion and neck supple.  Skin:    General: Skin is warm and dry.     Findings: Erythema present.     Comments: Several isolated nodular areas with surrounding erythema and edema bilateral lower legs.  There is nontender to palpation, no peeling, blistering, obvious skin breakdown  Neurological:     Mental Status: She is alert and oriented to person, place, and time.  Psychiatric:        Thought Content: Thought content normal.         Judgment: Judgment normal.     Comments: Mildly anxious      UC Treatments / Results  Labs (all labs ordered are listed, but only abnormal results are displayed) Labs Reviewed - No data to display  EKG   Radiology No results found.  Procedures Procedures (including critical care time)  Medications Ordered in UC Medications - No data to display  Initial Impression / Assessment and Plan / UC Course  I have reviewed the triage vital signs and the nursing notes.  Pertinent labs & imaging results that were available during my care of the patient were reviewed by me and considered in my medical decision making (see chart for details).     Areas most consistent with insect bite, though source unknown.  We will continue to monitor closely, treat with clobetasol ointment twice daily as needed and twice daily antihistamines.  Follow-up with primary care if not fully resolving in the next week.  Final Clinical Impressions(s) / UC Diagnoses   Final diagnoses:  Rash     Discharge Instructions  Taking antihistamine twice daily and use the clobetasol ointment twice daily as needed to the affected areas.  Do not use this ointment on the face or private areas as it is a very strong steroid cream.  Use cortisone over-the-counter cream if beginning to have itchiness or rash on either of those areas.  Follow-up with primary care if this does not resolve in the next week.    ED Prescriptions    Medication Sig Dispense Auth. Provider   clobetasol ointment (TEMOVATE) 1.36 % Apply 1 application topically 2 (two) times daily. 60 g Volney American, [place]     PDMP not reviewed this encounter.   Volney American, [place] 11/06/20 1050

## 2020-11-06 NOTE — Discharge Instructions (Addendum)
Taking antihistamine twice daily and use the clobetasol ointment twice daily as needed to the affected areas.  Do not use this ointment on the face or private areas as it is a very strong steroid cream.  Use cortisone over-the-counter cream if beginning to have itchiness or rash on either of those areas.  Follow-up with primary care if this does not resolve in the next week.

## 2020-11-06 NOTE — ED Triage Notes (Signed)
Pt present rash on both legs. Pt states there is a some red spots on her lower legs that are itching and slightly warm to the touch.

## 2020-11-07 ENCOUNTER — Encounter: Payer: Self-pay | Admitting: Family

## 2020-11-08 ENCOUNTER — Ambulatory Visit: Payer: 59 | Admitting: Family

## 2020-11-08 ENCOUNTER — Other Ambulatory Visit: Payer: Self-pay | Admitting: Family

## 2020-11-08 ENCOUNTER — Encounter: Payer: Self-pay | Admitting: Family

## 2020-11-08 ENCOUNTER — Other Ambulatory Visit: Payer: Self-pay

## 2020-11-08 VITALS — BP 118/84 | HR 84 | Temp 98.2°F | Ht 60.0 in | Wt 126.2 lb

## 2020-11-08 DIAGNOSIS — L309 Dermatitis, unspecified: Secondary | ICD-10-CM | POA: Diagnosis not present

## 2020-11-08 MED ORDER — DOXYCYCLINE HYCLATE 100 MG PO TABS
100.0000 mg | ORAL_TABLET | Freq: Two times a day (BID) | ORAL | 0 refills | Status: DC
Start: 2020-11-08 — End: 2020-11-08

## 2020-11-08 MED ORDER — FAMOTIDINE 20 MG PO TABS
20.0000 mg | ORAL_TABLET | Freq: Two times a day (BID) | ORAL | 0 refills | Status: DC
Start: 2020-11-08 — End: 2020-11-08

## 2020-11-08 MED FILL — FAMOTIDINE 20 MG TABS: 20 | 15 days supply | Qty: 30 | Fill #0

## 2020-11-08 MED FILL — DOXYCYCLINE HYCLATE 100 MG: 100 | 7 days supply | Qty: 14 | Fill #0

## 2020-11-08 NOTE — Progress Notes (Signed)
Chelsey Hunt is a 55 y.o. female with the following history as recorded in Epic[employer]:  Patient Active Problem List   Diagnosis Date Noted  . Raynaud phenomenon 09/17/2019  . Hand cramps 09/17/2019  . Carpal tunnel syndrome on left 09/17/2019  . Anxiety 07/30/2019  . Rash and nonspecific skin eruption 07/01/2019  . Musculoskeletal strain 03/25/2019  . Visit for screening mammogram 03/04/2019  . Chronic idiopathic constipation 01/08/2019  . Essential hypertension 10/24/2018  . Psychophysiological insomnia 07/07/2018  . Seasonal allergic rhinitis due to pollen 08/08/2017  . Intractable migraine without aura and without status migrainosus 07/02/2016  . Hyperglycemia 11/28/2015  . Pancreas divisum of native pancreas 12/27/2014  . Routine general medical examination at a health care facility 07/30/2012  . GERD (gastroesophageal reflux disease)   . Hypothyroidism 08/12/2009    Current Outpatient Medications  Medication Sig Dispense Refill  . acetaminophen (TYLENOL) 325 MG tablet Take 650 mg by mouth every 6 (six) hours as needed for mild pain.    Marland Kitchen AJOVY 225 MG/1.5ML SOAJ INJECT 225 MG INTO THE SKIN EVERY 30 (THIRTY) DAYS. 1.5 mL 11  . clobetasol ointment (TEMOVATE) 1.94 % Apply 1 application topically 2 (two) times daily. 60 g 0  . co-enzyme Q-10 30 MG capsule Take 200 mg by mouth 3 (three) times daily.    . cyclobenzaprine (FLEXERIL) 5 MG tablet Take 1 tablet (5 mg total) by mouth 3 (three) times daily as needed for muscle spasms. 30 tablet 1  . dicyclomine (BENTYL) 10 MG capsule Take by mouth.    . doxycycline (VIBRA-TABS) 100 MG tablet Take 1 tablet (100 mg total) by mouth 2 (two) times daily. 14 tablet 0  . famotidine (PEPCID) 20 MG tablet Take 1 tablet (20 mg total) by mouth 2 (two) times daily. 30 tablet 0  . gabapentin (NEURONTIN) 100 MG capsule Take 1 capsule (100 mg total) by mouth at bedtime. 30 capsule 5  . ibuprofen (ADVIL,MOTRIN) 200 MG tablet Take 200 mg by mouth every  8 (eight) hours as needed.     . pantoprazole (PROTONIX) 40 MG tablet Take 40 mg by mouth 2 (two) times daily.    . predniSONE (STERAPRED UNI-PAK 21 TAB) 10 MG (21) TBPK tablet take 60mg  day 1, then 50mg  day 2, then 40mg  day 3, then 30mg  day 4, then 20mg  day 5, then 10mg  day 6, then STOP 21 tablet 0  . promethazine (PHENERGAN) 25 MG tablet Take 1 tablet (25 mg total) by mouth every 6 (six) hours as needed for nausea or vomiting. 30 tablet 5  . SYNTHROID 75 MCG tablet Take 1 tablet (75 mcg total) by mouth daily. 90 tablet 3  . Ubrogepant (UBRELVY) 100 MG TABS Take 1 tablet by mouth as needed (May repeat in 2 hours.  Maximum 2 tablets in 24 hours.). 16 tablet 5  . traMADol-acetaminophen (ULTRACET) 37.5-325 MG tablet Take 1 tablet by mouth every 6 (six) hours as needed. (Patient not taking: Reported on 11/08/2020) 15 tablet 0   No current facility-administered medications for this visit.    Allergies: Imitrex [sumatriptan], Propoxyphene n-acetaminophen, Nabumetone, and Topiramate  Past Medical History:  Diagnosis Date  . ACID REFLUX DISEASE 01/03/2008  . Anemia   . ANEMIA, IRON DEFICIENCY 12/23/2008   history  . Anxiety   . Chronic tension type headache 01/13/2009   migraines  . Complication of anesthesia    pt states she just needs a small amount or she will sleep too long  . Dysplasia of cervix,  low grade (CIN 1)   . Fibroid   . GERD (gastroesophageal reflux disease)   . HEART MURMUR, SYSTOLIC 19/14/7829   will have echo 06/18/2013  . 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 Irritable bowel syndrome 12/23/2008  . Migraine   . Ovarian cyst   . Ovarian cyst   . THYROID NODULE, RIGHT 04/21/2009    Past Surgical History:  Procedure Laterality Date  . ABDOMINAL HYSTERECTOMY    . COLONOSCOPY    . HYSTEROSCOPY    . LAPAROSCOPY    . NOVASURE ABLATION    . POLYPECTOMY    . TUBAL LIGATION    . UPPER GI ENDOSCOPY    . VAGINAL HYSTERECTOMY N/A 06/24/2013   Procedure: TOTAL HYSTERECTOMY  VAGINAL;  Surgeon: Alwyn Pea, MD;  Location: [facility];  Service: Gynecology;  Laterality: N/A;    Family History  Problem Relation Age of Onset  . Goiter Mother   . Heart disease Mother   . Thyroid disease Mother   . Heart attack Mother   . Graves' disease Sister   . Asthma Sister   . Cancer Father   . Heart disease Father   . Heart attack Father   . CVA Brother   . Hypertension Brother   . Hypertension Brother   . Alcoholism Brother   . Stomach cancer Sister   . Hepatitis Sister   . Stomach cancer Sister   . Goiter Other        Siblings 2  . Hypertension Paternal Grandfather   . Depression Neg Hx   . Alcohol abuse Neg Hx   . Drug abuse Neg Hx   . Early death Neg Hx   . Hearing loss Neg Hx   . Kidney disease Neg Hx   . Stroke Neg Hx     Social History   Tobacco Use  . Smoking status: Never Smoker  . Smokeless tobacco: Never Used  Substance Use Topics  . Alcohol use: No    Comment: occ    Subjective:   Patient was seen at [facility] 2 days ago with concerns for insect bites; did not actually see any insect but was working in her yard prior to onset of symptoms; has been using topical steroid cream and noticing some improvement in 2 out of the 3 lesions; the largest area of concern beneath the left knee does have a small blister present but she does actually think this area is improving as well;   Objective:  Vitals:   11/08/20 1254  BP: 118/84  Pulse: 84  Temp: 98.2 F (36.8 C)  TempSrc: Oral  SpO2: 99%  Weight: 126 lb 3.2 oz (57.2 kg)  Height: 5' (1.524 m)    General: Well developed, well nourished, in no acute distress  Skin : Warm and dry. Macular, Erythematous lesions noted on right inner ankle; left lower leg, left leg beneath knee Head: Normocephalic and atraumatic  Lungs: Respirations unlabored;  Musculoskeletal: No deformities; no active joint inflammation  Extremities: No edema, cyanosis, clubbing  Vessels: Symmetric bilaterally  Neurologic: Alert  and oriented; speech intact; face symmetrical; moves all extremities well; CNII-XII intact without focal deficit   Assessment:  1. Dermatitis     Plan:  Suspect insect bite/ allergic reaction; continue Claritin and add Pepcid; Rx for Doxycycline 100 mg bid x 7 days; follow up worse, no better.   This visit occurred during the SARS-CoV-2 public health emergency.  Safety protocols were in place, including screening questions prior to the visit,  additional usage of staff PPE, and extensive cleaning of exam room while observing appropriate contact time as indicated for disinfecting solutions.   She will consider transferring her care to Dr. Quay Burow if she does not want to transfer to the [facility] office.   No follow-ups on file.  No orders of the defined types were placed in this encounter.   Requested Prescriptions   Signed Prescriptions Disp Refills  . doxycycline (VIBRA-TABS) 100 MG tablet 14 tablet 0    Sig: Take 1 tablet (100 mg total) by mouth 2 (two) times daily.  . famotidine (PEPCID) 20 MG tablet 30 tablet 0    Sig: Take 1 tablet (20 mg total) by mouth 2 (two) times daily.

## 2020-11-09 ENCOUNTER — Ambulatory Visit: Payer: 59 | Admitting: Family

## 2020-11-15 IMAGING — DX DG CHEST 2V
2 series · 2 of 2 positions shown · non-contrast
Comparison: 06/26/2018

CLINICAL DATA: Chest pain, shortness of breath

EXAM:
CHEST - 2 VIEW

[chest pa]
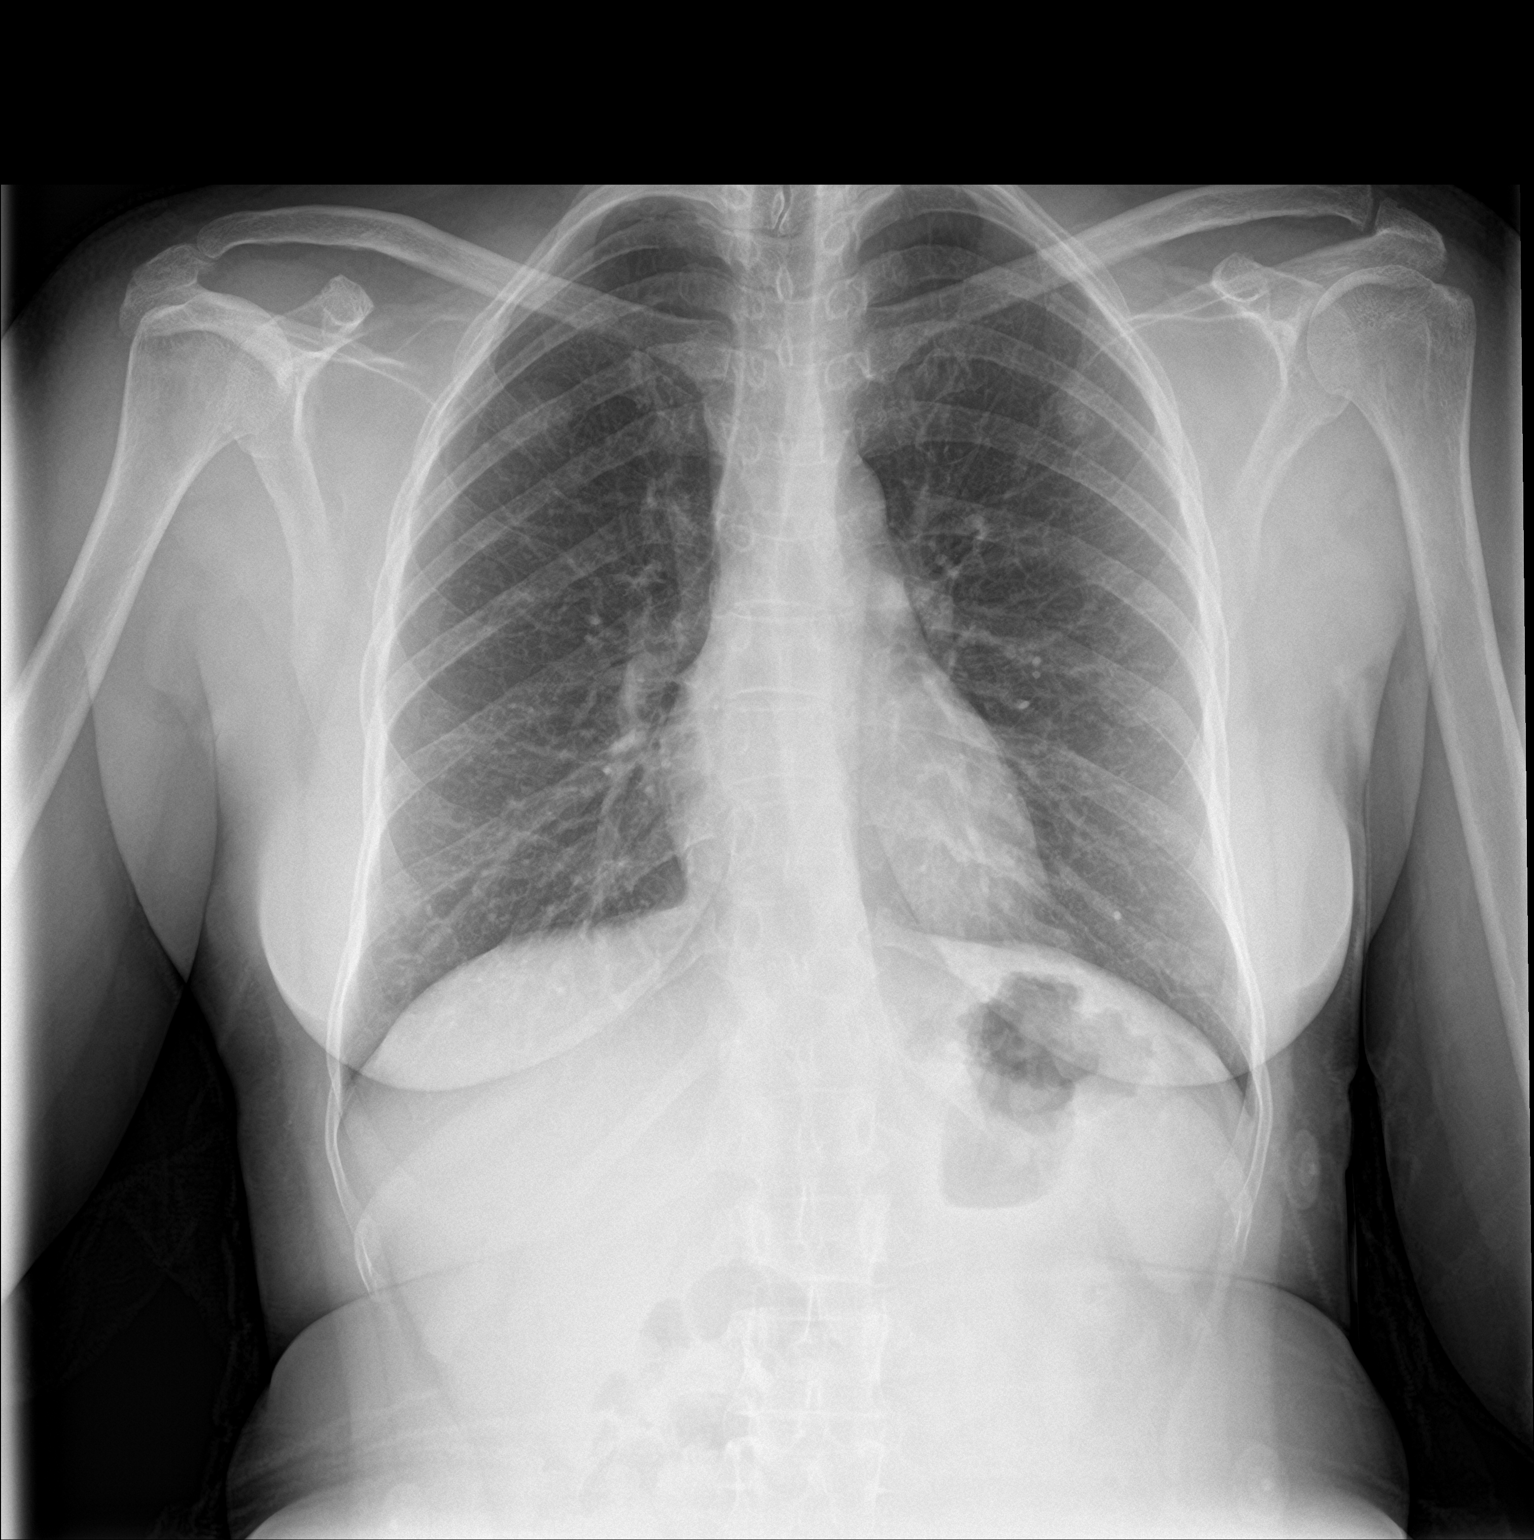

[chest lat]
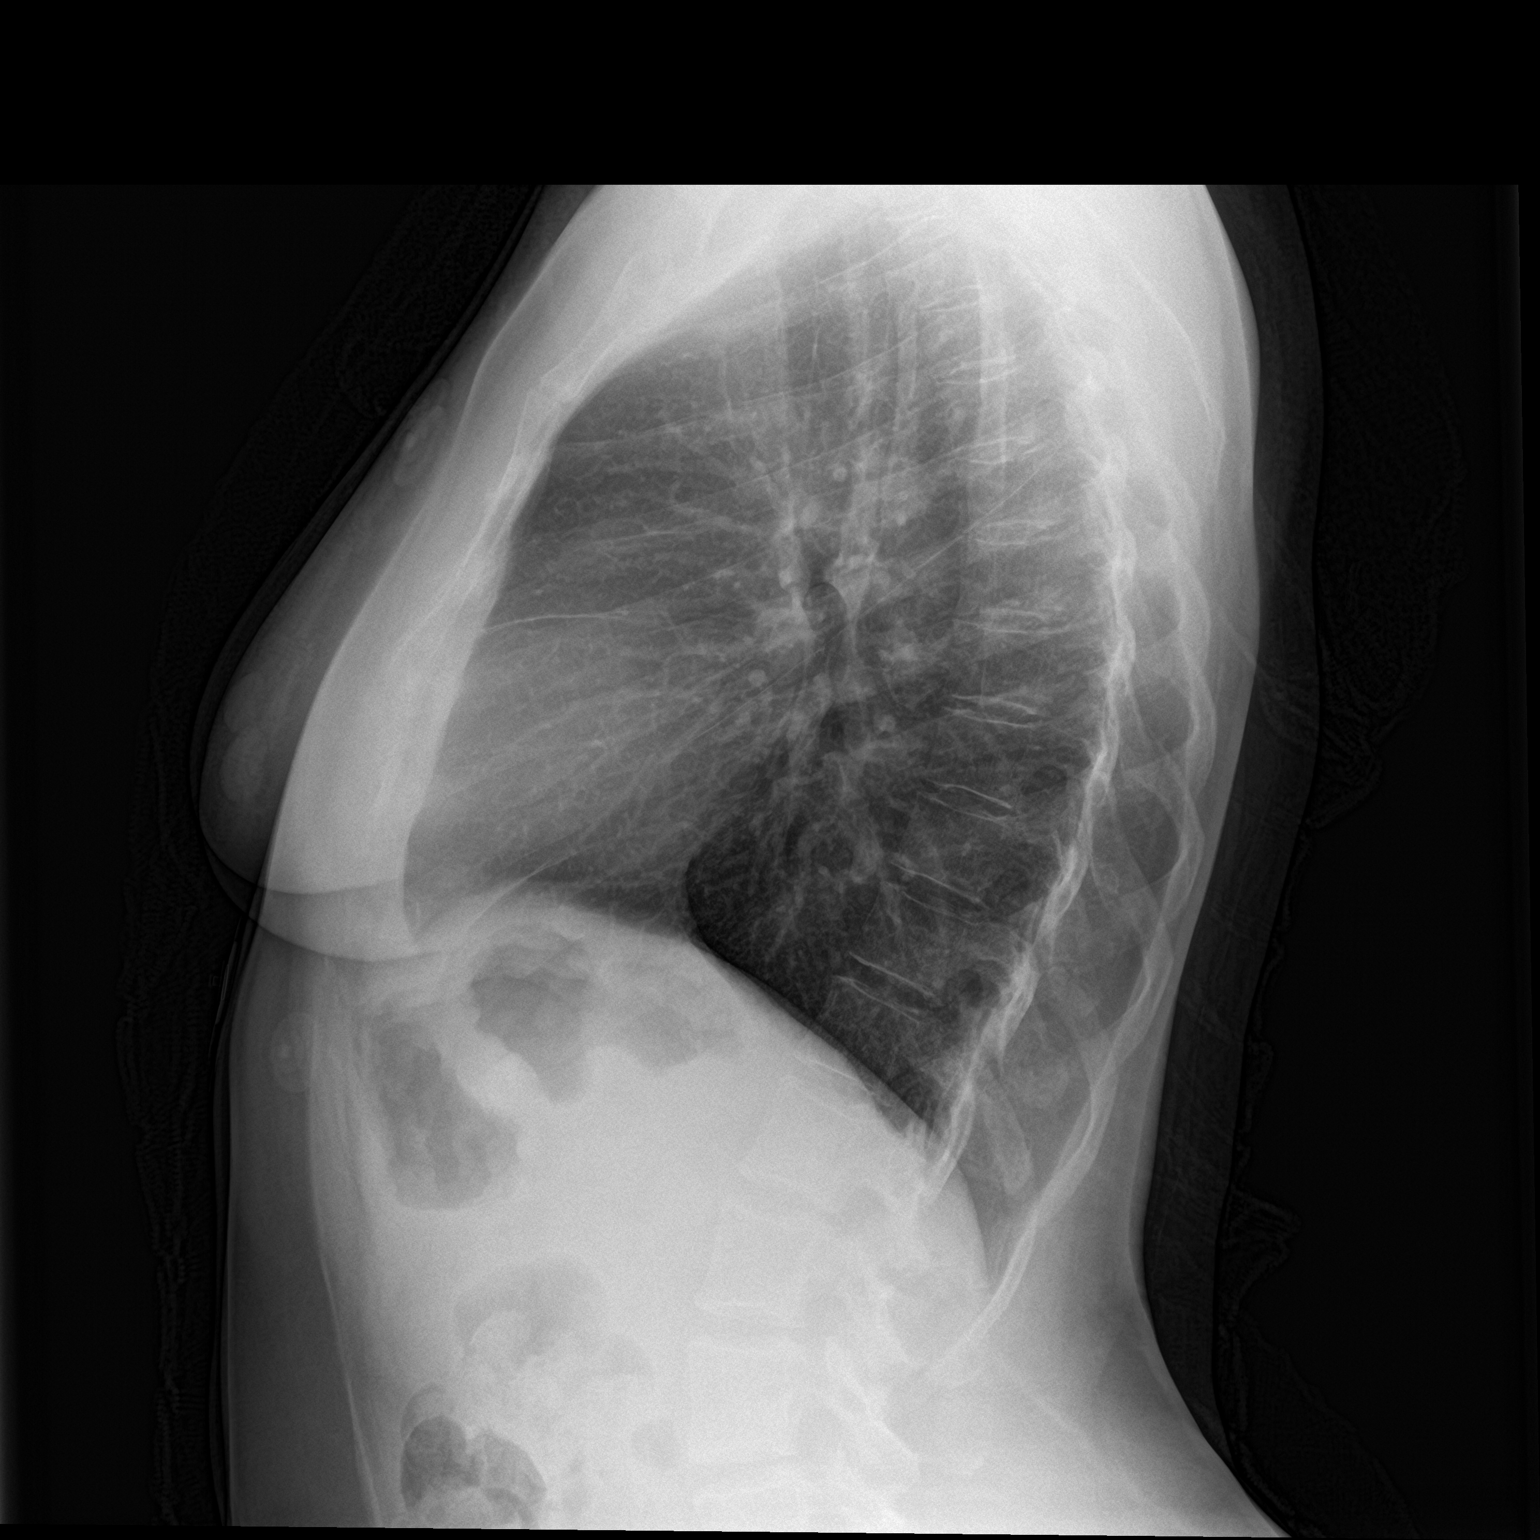

[2 of 2 positions shown; findings below may reference images not displayed]

FINDINGS: Heart and mediastinal contours are within normal limits. No focal
opacities or effusions. No acute bony abnormality.
IMPRESSION: No active cardiopulmonary disease.

## 2020-11-29 MED FILL — PANTOPRAZOLE SOD DR 40 MG T: 40 | 90 days supply | Qty: 180 | Fill #1

## 2020-12-13 NOTE — Progress Notes (Signed)
NEUROLOGY FOLLOW UP OFFICE NOTE  Chelsey Hunt [id number]  Assessment/Plan:   1.  Chronic migraine with and without aura - now chronic again 2.  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  Migraine prevention:  Botox 2.  Migraine rescue:  [name] 100mg  3.  Limit use of pain relievers to no more than 2 days out of week to prevent risk of rebound or medication-overuse headache. 4.  Keep headache diary 5.  Follow up for Botox  Subjective:  Chelsey Hunt is a 55year old right-handed woman with hypothyroidism who follows up for Migraines.  UPDATE: Patient did not want to start Vyepti.  Agreeable to Botox Intensity:  MIld to Severe Duration:  Rests and better the next day.  100mg  [name] more effective than 50mg  Frequency:  15 to 20 days a month for the past 3 months (severe 3 days out of the month) Takes Tylenol or Advil on average no more than 2 days out of the week. Current NSAIDS:ASA 81mg , Tylenol, Advil Current analgesics: none Current triptans:None Current ergotamine: None Current abortive anti-CGRP:none Current anti-emetic: Promethazine Current muscle relaxants: Cyclobenzaprine 10 mg Current anti-anxiolytic: None Current sleep aide: None Current Antihypertensive medications: None Current Antidepressant medications:none Current Anticonvulsant medications:gabapentin100mg  QHS (for scalp neuralgia; cannot tolerate higher doses) Current anti-CGRP:Ubrelvy 100mg   Current Vitamins/Herbal/Supplements:Co Q-10; turmeric Current Antihistamines/Decongestants: None Other therapy:none Hormone/birth control: None  Caffeine: No Diet: 5-6 bottles of water daily Exercise: Not routine Depression: No; Anxiety: Yes Other pain:neck pain. She has cervical spondylosis Sleep hygiene: Poor  HISTORY: Onset:First migraine was between66 and 55 years old. Migraine free until 2004 during her menstrual cycle. Chronic for many years. Location:Left more than right.  Left  eye pain Quality:throbbing Initial intensity:Severe.  Aura:no Prodrome:no Postdrome:fatigue Associated symptoms:Right facial tingling,nausea, photophobia, phonophobia, osmophobia.Shedenies associated vomiting, visual disturbance orunilateral numbness or weakness. Initial duration:3 days InitialFrequency:daily InitialFrequency of abortive medication:Advil or Tylenol daily, Maxalt 2 days a week Triggers: Certain smells, change in barometric pressure, sausages, certain cheese, bananas;brazilian nutes,COVID-19 (contracted in September 2020) Relieving factors: Exercise, staying hydrated Activity:aggravates  During summer 2019, she has had throbbing occipital headache,right-sided neck pain radiating up to the occiput,a burningscalp neuralgia,associated with tinnitus, sometimesleftor right sidedfacial numbness, facial twitching. She started having a panic attack with palpitations. She went to the ED on 06/26/18 for symptoms. EKG showed sinus tachycardia. She was treated with headache cocktail.To evaluate worsening headaches, shehad an MRI of the brain on 11/21/2018 which demonstrated nonspecific white matter hyperintensities in the cerebral white matter, likely chronic small vessel ischemic changes, but no acute abnormality.For further evaluation of neck pain, cervical spine X-ray on 04/10/2019 showed slight refersal of the normal cervical lordosis with minimal retrolisthesis of C5 on C6 and cervical spondylosis with osteophytes most notable at C4-5 and C5-6.  To further evaluate abnormal white matter on brain MRI, she had MRI of cervical and thoracic spine without contrast on 03/26/2020 which personally reviewed showed no cord lesions but did show incidental 1 cm T1 and T2 dark ovoid lesion in the T6 vertebral body.  Follow up MRI thoracic spine with contrast on 05/25/2020 showed no change and no enhancement, thought to represent benign marrow focus .  She was  evaluated by ophthalmology in September which demonstrated dry eye but no concerning findings such as optic neuritis.  She has bilateral tinnitus. Exam by ENT unremarkable.  Past NSAIDS:Ibuprofen, naproxen, Cambia Past analgesics:Excedrin, Lidocaine nasal Past abortive triptans:Relpax, sumatriptantablet, Maxalt, Tosymra NS, Zembrace. Past abortive ergotamine:Migranal Past muscle relaxants:none Past anti-emetic:Zofran Past antihypertensive medications:Verapamil,  propranolol Past antidepressant medications:Imipramine, nortriptyline Past anticonvulsant medications:topiramate Past anti-CGRP:Aimovig (aggravated migraine), Emgality (ineffective), Ajovy, Nurtec Past vitamins/Herbal/Supplements:Riboflavin, magnesium Past antihistamines/decongestants:None Other past therapies:acupuncture (triggered migraine), Reyvow, physical therapy for neck pain; dry needling  Family history of headache:Mom (migraines), daughter (migraines)  MRI and MRA of the head from 03/09/2009 revealed nonspecific small subcortical white matter hyperintensities and 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  MRI of the brain on 11/21/2018 which demonstrated nonspecific white matter hyperintensities in the cerebral white matter.  To evaluate new visual aura with migraine (hair floating in her vision), MRI of brain with and without contrast performed on 02/10/2020 again demonstrated non-enhancing white matter T2 and FLAIR signal in the cerebral hemispheres, stable compared to prior imaging from 2020.  PAST MEDICAL HISTORY: Past Medical History:  Diagnosis Date  . ACID REFLUX DISEASE 01/03/2008  . Anemia   . ANEMIA, IRON DEFICIENCY 12/23/2008   history  . Anxiety   . Chronic tension type headache 01/13/2009   migraines  . Complication of anesthesia    pt states she just needs a small amount or she will sleep too long  . Dysplasia of cervix, low grade (CIN 1)    . Fibroid   . GERD (gastroesophageal reflux disease)   . HEART MURMUR, SYSTOLIC 81/19/1478   will have echo 06/18/2013  . 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 Irritable bowel syndrome 12/23/2008  . Migraine   . Ovarian cyst   . Ovarian cyst   . THYROID NODULE, RIGHT 04/21/2009    MEDICATIONS: Current Outpatient Medications on File Prior to Visit  Medication Sig Dispense Refill  . acetaminophen (TYLENOL) 325 MG tablet Take 650 mg by mouth every 6 (six) hours as needed for mild pain.    Marland Kitchen AJOVY 225 MG/1.5ML SOAJ INJECT 225 MG INTO THE SKIN EVERY 30 (THIRTY) DAYS. 1.5 mL 11  . clobetasol ointment (TEMOVATE) 2.95 % Apply 1 application topically 2 (two) times daily. 60 g 0  . co-enzyme Q-10 30 MG capsule Take 200 mg by mouth 3 (three) times daily.    . cyclobenzaprine (FLEXERIL) 5 MG tablet Take 1 tablet (5 mg total) by mouth 3 (three) times daily as needed for muscle spasms. 30 tablet 1  . dicyclomine (BENTYL) 10 MG capsule Take by mouth.    . doxycycline (VIBRA-TABS) 100 MG tablet TAKE 1 TABLET (100 MG TOTAL) BY MOUTH 2 (TWO) TIMES DAILY. 14 tablet 0  . famotidine (PEPCID) 20 MG tablet TAKE 1 TABLET (20 MG TOTAL) BY MOUTH 2 (TWO) TIMES DAILY. 30 tablet 0  . gabapentin (NEURONTIN) 100 MG capsule Take 1 capsule (100 mg total) by mouth at bedtime. 30 capsule 5  . ibuprofen (ADVIL,MOTRIN) 200 MG tablet Take 200 mg by mouth every 8 (eight) hours as needed.     . pantoprazole (PROTONIX) 40 MG tablet Take 40 mg by mouth 2 (two) times daily.    . pantoprazole (PROTONIX) 40 MG tablet TAKE 1 TABLET BY MOUTH TWICE A DAY 30 MINUTES BEFORE BREAKFAST AND DINNER 180 tablet 2  . predniSONE (DELTASONE) 10 MG tablet TAKE 6 TABS BY MOUTH ON DAY 1; 5 TABS ON DAY 2; 4 TABS ON DAY 3; 3 TABS ON DAY 4; 2 TABS ON DAY 5; 1 TAB ON DAY 6 THEN STOP. 21 tablet 0  . predniSONE (STERAPRED UNI-PAK 21 TAB) 10 MG (21) TBPK tablet take 60mg  day 1, then 50mg  day 2, then 40mg  day 3, then 30mg  day 4, then 20mg  day 5,  then  10mg  day 6, then STOP 21 tablet 0  . promethazine (PHENERGAN) 25 MG tablet Take 1 tablet (25 mg total) by mouth every 6 (six) hours as needed for nausea or vomiting. 30 tablet 5  . SYNTHROID 75 MCG tablet TAKE 1 TABLET (75 MCG TOTAL) BY MOUTH DAILY. 90 tablet 3  . traMADol-acetaminophen (ULTRACET) 37.5-325 MG tablet Take 1 tablet by mouth every 6 (six) hours as needed. (Patient not taking: Reported on 11/08/2020) 15 tablet 0  . Ubrogepant 100 MG TABS TAKE 1 TABLET BY MOUTH AS NEEDED (MAY REPEAT IN 2 HOURS. MAXIMUM 2 TABLETS IN 24 HOURS.). 16 tablet 5  . valACYclovir (VALTREX) 1000 MG tablet TAKE 1 TABLET EVERY 12 HOURS BY MOUTH FOR 5 DAYS 30 tablet 11   No current facility-administered medications on file prior to visit.    ALLERGIES: Allergies  Allergen Reactions  . Imitrex [Sumatriptan] Palpitations  . Propoxyphene N-Acetaminophen   . Nabumetone Rash    REACTION: GI upset  . Topiramate Rash    REACTION: rash    FAMILY HISTORY: Family History  Problem Relation Age of Onset  . Goiter Mother   . Heart disease Mother   . Thyroid disease Mother   . Heart attack Mother   . Graves' disease Sister   . Asthma Sister   . Cancer Father   . Heart disease Father   . Heart attack Father   . CVA Brother   . Hypertension Brother   . Hypertension Brother   . Alcoholism Brother   . Stomach cancer Sister   . Hepatitis Sister   . Stomach cancer Sister   . Goiter Other        Siblings 2  . Hypertension Paternal Grandfather   . Depression Neg Hx   . Alcohol abuse Neg Hx   . Drug abuse Neg Hx   . Early death Neg Hx   . Hearing loss Neg Hx   . Kidney disease Neg Hx   . Stroke Neg Hx       Objective:  Blood pressure 138/80, pulse 88, height 5' (1.524 m), weight 128 lb 3.2 oz (58.2 kg), last menstrual period 04/20/2013, SpO2 99 %. General: No acute distress.  Patient appears well-groomed.     Metta Clines, DO  CC:  Marrian Salvage, [place]

## 2020-12-15 ENCOUNTER — Ambulatory Visit (INDEPENDENT_AMBULATORY_CARE_PROVIDER_SITE_OTHER): Payer: 59 | Admitting: Neurology

## 2020-12-15 ENCOUNTER — Other Ambulatory Visit: Payer: Self-pay

## 2020-12-15 ENCOUNTER — Encounter: Payer: Self-pay | Admitting: Neurology

## 2020-12-15 VITALS — BP 138/80 | HR 88 | Ht 60.0 in | Wt 128.2 lb

## 2020-12-15 DIAGNOSIS — M542 Cervicalgia: Secondary | ICD-10-CM | POA: Diagnosis not present

## 2020-12-15 DIAGNOSIS — G43709 Chronic migraine without aura, not intractable, without status migrainosus: Secondary | ICD-10-CM | POA: Diagnosis not present

## 2020-12-15 NOTE — Patient Instructions (Addendum)
Set up for Botox Ubrelvy 100mg  as needed Will send new referral to physical therapy for Dry needling.

## 2020-12-16 ENCOUNTER — Ambulatory Visit: Payer: 59 | Admitting: Neurology

## 2020-12-28 ENCOUNTER — Other Ambulatory Visit: Payer: Self-pay

## 2020-12-28 ENCOUNTER — Ambulatory Visit (INDEPENDENT_AMBULATORY_CARE_PROVIDER_SITE_OTHER): Payer: 59

## 2020-12-28 ENCOUNTER — Other Ambulatory Visit (HOSPITAL_COMMUNITY): Payer: Self-pay

## 2020-12-28 ENCOUNTER — Telehealth: Payer: Self-pay | Admitting: *Deleted

## 2020-12-28 ENCOUNTER — Encounter: Payer: Self-pay | Admitting: Podiatry

## 2020-12-28 ENCOUNTER — Ambulatory Visit: Payer: 59 | Admitting: Podiatry

## 2020-12-28 DIAGNOSIS — M722 Plantar fascial fibromatosis: Secondary | ICD-10-CM

## 2020-12-28 DIAGNOSIS — M778 Other enthesopathies, not elsewhere classified: Secondary | ICD-10-CM

## 2020-12-28 MED ORDER — MELOXICAM 15 MG PO TABS
15.0000 mg | ORAL_TABLET | Freq: Every day | ORAL | 2 refills | Status: DC
Start: 2020-12-28 — End: 2021-02-21
  Filled 2020-12-28: qty 30, 30d supply, fill #0

## 2020-12-28 MED ORDER — TRIAMCINOLONE ACETONIDE 10 MG/ML IJ SUSP
10.0000 mg | Freq: Once | INTRAMUSCULAR | Status: AC
  Administered 2020-12-28: 10 mg

## 2020-12-28 NOTE — Progress Notes (Signed)
Subjective:   Patient ID: Chelsey Hunt, female   DOB: 55 y.o.   MRN: [id number]   HPI Patient presents stating she developed a lot of pain in the outside of her left foot and her feet in general are just sore and she needs something to help hold up her arches.  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

## 2020-12-28 NOTE — Telephone Encounter (Signed)
Patient is calling for the status of an antiinflammatory medicine that was suppose to be sent to pharmacy. Returned call and left Vmessage that according to our records the medication (Meloxicam-15 mg)has been sent to pharmacy on file.

## 2020-12-29 ENCOUNTER — Other Ambulatory Visit (HOSPITAL_COMMUNITY): Payer: Self-pay

## 2020-12-29 MED FILL — Ubrogepant Tab 100 MG: ORAL | 30 days supply | Qty: 16 | Fill #0 | Status: AC

## 2021-01-04 ENCOUNTER — Other Ambulatory Visit (HOSPITAL_COMMUNITY): Payer: Self-pay

## 2021-01-04 MED FILL — Levothyroxine Sodium Tab 75 MCG: ORAL | 90 days supply | Qty: 90 | Fill #0 | Status: AC

## 2021-01-11 ENCOUNTER — Encounter: Payer: Self-pay | Admitting: Physical Therapy

## 2021-01-11 ENCOUNTER — Ambulatory Visit: Payer: 59 | Attending: Neurology | Admitting: Physical Therapy

## 2021-01-11 ENCOUNTER — Other Ambulatory Visit: Payer: Self-pay

## 2021-01-11 DIAGNOSIS — M62838 Other muscle spasm: Secondary | ICD-10-CM | POA: Diagnosis not present

## 2021-01-11 DIAGNOSIS — R293 Abnormal posture: Secondary | ICD-10-CM | POA: Insufficient documentation

## 2021-01-11 DIAGNOSIS — M5124 Other intervertebral disc displacement, thoracic region: Secondary | ICD-10-CM | POA: Diagnosis not present

## 2021-01-11 DIAGNOSIS — M542 Cervicalgia: Secondary | ICD-10-CM | POA: Insufficient documentation

## 2021-01-11 NOTE — Therapy (Signed)
[place], [place], [address] Phone: [phone]   Fax:  [phone]  Physical Therapy Evaluation  Patient Details  Name: Chelsey Hunt MRN: [id number] Date of Birth: 03-Mar-1966 Referring Provider (PT): Pieter Partridge, DO   Encounter Date: 01/11/2021   PT End of Session - 01/11/21 1019    Visit Number 1    Number of Visits 9    Date for PT Re-Evaluation 03/08/21    Authorization Type [facility]- FOTO 6th and 10th visit    PT Start Time 0935    PT Stop Time 1019    PT Time Calculation (min) 44 min    Activity Tolerance Patient tolerated treatment well    Behavior During Therapy [facility] for tasks assessed/performed           Past Medical History:  Diagnosis Date  . ACID REFLUX DISEASE 01/03/2008  . Anemia   . ANEMIA, IRON DEFICIENCY 12/23/2008   history  . Anxiety   . Chronic tension type headache 01/13/2009   migraines  . Complication of anesthesia    pt states she just needs a small amount or she will sleep too long  . Dysplasia of cervix, low grade (CIN 1)   . Fibroid   . GERD (gastroesophageal reflux disease)   . HEART MURMUR, SYSTOLIC [id number]   will have echo 06/18/2013  . 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 Irritable bowel syndrome 12/23/2008  . Migraine   . Ovarian cyst   . Ovarian cyst   . THYROID NODULE, RIGHT 04/21/2009    Past Surgical History:  Procedure Laterality Date  . ABDOMINAL HYSTERECTOMY    . COLONOSCOPY    . HYSTEROSCOPY    . LAPAROSCOPY    . NOVASURE ABLATION    . POLYPECTOMY    . TUBAL LIGATION    . UPPER GI ENDOSCOPY    . VAGINAL HYSTERECTOMY N/A 06/24/2013   Procedure: TOTAL HYSTERECTOMY VAGINAL;  Surgeon: Alwyn Pea, MD;  Location: [facility];  Service: Gynecology;  Laterality: N/A;    There were no vitals filed for this visit.    Subjective Assessment - 01/11/21 0941    Subjective pt is a 55 y.o F with CC of neck pain that she was seen for  previously over a year ago. she reports the neck pain started about 1 1/2 months ago with no specific onset that she can remember. she reports pain is on both sides and middle. she reports N/T in the fingers which she notes could be due to poor circulation, but notes it has been off an on. hx of migraines with the last one being Monday, denies any dysarthria or dysphagia. Since onset she notes the symptoms are staying consistent.    How long can you sit comfortably? unlimited    How long can you stand comfortably? unlimited    How long can you walk comfortably? unlimited    Diagnostic tests nothing recent    Patient Stated Goals reduce stiffness/ pain, reduce HA frequency    Currently in Pain? Yes    Pain Score 1    7/10   Pain Location Neck    Pain Orientation Right;Left    Pain Type Chronic pain    Pain Radiating Towards potential referral into bil fingers    Pain Onset More than a month ago    Pain Frequency Intermittent    Aggravating Factors  sleeping/ laying down, looking to the L    Pain Relieving Factors  stretching    Effect of Pain on Daily Activities sleeping              [facility] PT Assessment - 01/11/21 0001      Assessment   Medical Diagnosis Neck pain    Referring Provider (PT) Pieter Partridge, DO    Onset Date/Surgical Date --   1 1/2 months ago   Hand Dominance Right    Next MD Visit --   August   Prior Therapy yes   at this location     Precautions   Precautions None      Restrictions   Weight Bearing Restrictions No      Balance Screen   Has the patient fallen in the past 6 months No      [place] residence      Prior Function   Level of Independence Independent    Vocation Full time employment   CNA   Vocation Requirements lifting. pushing, pulling, carrying, bending      Cognition   Overall Cognitive Status Within Functional Limits for tasks assessed      Observation/Other Assessments   Focus on Therapeutic  Outcomes (FOTO)  59% function   64% predicted     Posture/Postural Control   Posture/Postural Control Postural limitations    Postural Limitations Rounded Shoulders;Forward head      ROM / Strength   AROM / PROM / Strength AROM;Strength      AROM   AROM Assessment Site Cervical    Cervical Flexion 50    Cervical Extension 42   Pain at end range   Cervical - Right Side Bend 42   Pain at end range R side   Cervical - Left Side Bend 38   pain at end range on Left   Cervical - Right Rotation 49    Cervical - Left Rotation 51   Pain, stiffness on Left     Strength   Strength Assessment Site Shoulder    Right/Left Shoulder Right;Left    Right Shoulder Flexion 4/5    Right Shoulder Extension 4+/5    Right Shoulder ABduction 4-/5    Right Shoulder Internal Rotation 4/5    Right Shoulder External Rotation 4+/5    Left Shoulder Flexion 4/5    Left Shoulder Extension 4-/5    Left Shoulder ABduction 4-/5    Left Shoulder Internal Rotation 4/5    Left Shoulder External Rotation 4-/5      Palpation   Spinal mobility Mobile, tender C6, C5, C3    Palpation comment B UT/levator, R infraspin                      Objective measurements completed on examination: See above findings.       [facility] PT Treatment/Exercise - 01/11/21 0001      Exercises   Exercises Neck      Neck Exercises: Seated   Neck Retraction 1 rep;5 reps;5 secs   tactile cues to maintain neutral cerval postion, pt demonstrated resting postion in R sidebend and rotation.   Cervical Rotation Both;5 reps   while maintain chin tuck   Other Seated Exercise scapular retraction 1 x 5 holding 5 seconds      Neck Exercises: Stretches   Levator Stretch Left;30 seconds;2 reps   2nd rep with manual overpressure                 PT Education -  01/11/21 0948    Education Details evaluation findings, POC, goals, HEP with proper form/ rationale. FOTO assessment, review of DN    Person(s) Educated  Patient    Methods Explanation;Verbal cues;Handout    Comprehension Verbalized understanding;Verbal cues required            PT Short Term Goals - 01/11/21 1030      PT SHORT TERM GOAL #1   Title pt to be I with inital HEP    Time 4    Period Weeks    Status New    Target Date 02/08/21      PT SHORT TERM GOAL #2   Title -             PT Long Term Goals - 01/11/21 1030      PT LONG TERM GOAL #1   Title pt to verbalize/ demo efficient posture and lifting mechanics to reduce and prevent neck pain    Time 8    Period Weeks    Status New    Target Date 03/08/21      PT LONG TERM GOAL #2   Title increase FOTO score to >/=64% to demo improvement in function    Time 8    Period Weeks    Status New    Target Date 03/08/21      PT LONG TERM GOAL #3   Title pt to report no HA for >/= 1 week to promote improvement in condition and QOL    Time 8    Period Weeks    Status New    Target Date 03/08/21      PT LONG TERM GOAL #4   Title increase bil shoulder shoulder strength to >/= 4+/5 to promote efficient posture and lifting mechanics to prevent and reduce shoulder /neck muscle tension    Time 8    Period Weeks    Status New    Target Date 03/08/21      PT LONG TERM GOAL #5   Title pt to be I with all HEP given as of last visit to maintain and progress current level of function    Time 8    Period Weeks    Status New    Target Date 03/08/21                  Plan - 01/11/21 1003    Clinical Impression Statement pt is a pleasant 55 y.o F with CC neck pain starting over 1 month ago with no specific MOI. She has been seen at this location previously over a year ago with similar symptoms. She has functional cervical mobility with mild reproduction of symptoms at end of range rotation/ sidebending and extension. MMT revealed mild weakness in the R shoulder compared bil. TTP along bil upper trap/ levator scapuale R>L and mulitple trigger points noted. reviewed and  conset was provided for TPDN focusing on bil upper trap, which she responded well in session noting pain dropped to 0/10. she would benefit from physical therapy to decrease neck pain/ muscle tension, promote cervical mobility without concordant symptoms, promote efficient posture and maximize her function by addressing the deficits listed.    Personal Factors and Comorbidities Comorbidity 2;Past/Current Experience    Comorbidities hx of anxiety, migraines, chronic tension related HA    Stability/Clinical Decision Making Evolving/Moderate complexity    Clinical Decision Making Moderate    Rehab Potential Good    PT Frequency 1x / week    PT  Duration 8 weeks    PT Treatment/Interventions ADLs/Self Care Home Management;Cryotherapy;Electrical Stimulation;Iontophoresis 4mg /ml Dexamethasone;Moist Heat;Traction;Ultrasound;Functional mobility training;Therapeutic activities;Therapeutic exercise;Neuromuscular re-education;Patient/family education;Manual techniques;Passive range of motion;Dry needling;Taping    PT Next Visit Plan Review/ update HEP PRN, response to DN for bvil upper trap and continue PRN, posture education, posterior chain strength    PT Home Exercise Plan [id number]- levator scapulae stretch, chin tuck, chin tuck with cervical rotation, scapular retraction.    Consulted and Agree with Plan of Care Patient           Patient will benefit from skilled therapeutic intervention in order to improve the following deficits and impairments:  Improper body mechanics,Postural dysfunction,Pain,Increased muscle spasms,Decreased strength,Decreased range of motion  Visit Diagnosis: Cervicalgia  Abnormal posture  Other muscle spasm     Problem List Patient Active Problem List   Diagnosis Date Noted  . Raynaud phenomenon 09/17/2019  . Hand cramps 09/17/2019  . Carpal tunnel syndrome on left 09/17/2019  . Anxiety 07/30/2019  . Rash and nonspecific skin eruption 07/01/2019  . Musculoskeletal  strain 03/25/2019  . Visit for screening mammogram 03/04/2019  . Chronic idiopathic constipation 01/08/2019  . Essential hypertension 10/24/2018  . Psychophysiological insomnia 07/07/2018  . Seasonal allergic rhinitis due to pollen 08/08/2017  . Intractable migraine without aura and without status migrainosus 07/02/2016  . Hyperglycemia 11/28/2015  . Pancreas divisum of native pancreas 12/27/2014  . Routine general medical examination at a health care facility 07/30/2012  . GERD (gastroesophageal reflux disease)   . Hypothyroidism 08/12/2009   Starr Lake PT, DPT, LAT, ATC  01/11/21  10:39 AM      [facility] [address] [place], [place], [address] Phone: [phone]   Fax:  [phone]  Name: Chelsey Hunt MRN: [id number] Date of Birth: [birth year]

## 2021-01-16 ENCOUNTER — Telehealth: Payer: Self-pay | Admitting: Podiatry

## 2021-01-16 NOTE — Telephone Encounter (Signed)
Orthotics in.. left message for pt to call to schedule an appt to pick them up.Chelsey Hunt

## 2021-01-18 ENCOUNTER — Encounter: Payer: 59 | Admitting: Physical Therapy

## 2021-01-18 ENCOUNTER — Other Ambulatory Visit: Payer: Self-pay

## 2021-01-18 ENCOUNTER — Other Ambulatory Visit (HOSPITAL_COMMUNITY): Payer: Self-pay

## 2021-01-18 ENCOUNTER — Encounter: Payer: Self-pay | Admitting: Physical Therapy

## 2021-01-18 ENCOUNTER — Ambulatory Visit: Payer: 59 | Admitting: Physical Therapy

## 2021-01-18 DIAGNOSIS — M62838 Other muscle spasm: Secondary | ICD-10-CM

## 2021-01-18 DIAGNOSIS — M542 Cervicalgia: Secondary | ICD-10-CM

## 2021-01-18 DIAGNOSIS — R293 Abnormal posture: Secondary | ICD-10-CM | POA: Diagnosis not present

## 2021-01-18 DIAGNOSIS — M5124 Other intervertebral disc displacement, thoracic region: Secondary | ICD-10-CM | POA: Diagnosis not present

## 2021-01-18 NOTE — Therapy (Signed)
[place], [place], [address] Phone: [phone]   Fax:  [phone]  Physical Therapy Treatment  Patient Details  Name: Chelsey Hunt MRN: [id number] Date of Birth: 05/01/1966 Referring Provider (PT): Pieter Partridge, DO   Encounter Date: 01/18/2021   PT End of Session - 01/18/21 0802    Visit Number 2    Number of Visits 9    Date for PT Re-Evaluation 03/08/21    Authorization Type [facility]- FOTO 6th and 10th visit    PT Start Time 0800    PT Stop Time 0840    PT Time Calculation (min) 40 min    Activity Tolerance Patient tolerated treatment well    Behavior During Therapy [facility] for tasks assessed/performed           Past Medical History:  Diagnosis Date  . ACID REFLUX DISEASE 01/03/2008  . Anemia   . ANEMIA, IRON DEFICIENCY 12/23/2008   history  . Anxiety   . Chronic tension type headache 01/13/2009   migraines  . Complication of anesthesia    pt states she just needs a small amount or she will sleep too long  . Dysplasia of cervix, low grade (CIN 1)   . Fibroid   . GERD (gastroesophageal reflux disease)   . HEART MURMUR, SYSTOLIC 54/27/0623   will have echo 06/18/2013  . 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 Irritable bowel syndrome 12/23/2008  . Migraine   . Ovarian cyst   . Ovarian cyst   . THYROID NODULE, RIGHT 04/21/2009    Past Surgical History:  Procedure Laterality Date  . ABDOMINAL HYSTERECTOMY    . COLONOSCOPY    . HYSTEROSCOPY    . LAPAROSCOPY    . NOVASURE ABLATION    . POLYPECTOMY    . TUBAL LIGATION    . UPPER GI ENDOSCOPY    . VAGINAL HYSTERECTOMY N/A 06/24/2013   Procedure: TOTAL HYSTERECTOMY VAGINAL;  Surgeon: Alwyn Pea, MD;  Location: [facility];  Service: Gynecology;  Laterality: N/A;    There were no vitals filed for this visit.   Subjective Assessment - 01/18/21 0802    Subjective " I just got off work, and I am stiff this morning but overall I am  doing pretty good."    Patient Stated Goals reduce stiffness/ pain, reduce HA frequency    Currently in Pain? Yes    Pain Score 3     Pain Location Neck    Pain Orientation Right;Left    Pain Descriptors / Indicators Aching    Pain Type Chronic pain    Pain Onset More than a month ago    Pain Frequency Intermittent    Aggravating Factors  sleeping positiong    Pain Relieving Factors stretch              [facility] PT Assessment - 01/18/21 0001      Assessment   Medical Diagnosis Neck pain    Referring Provider (PT) Pieter Partridge, DO                         [facility] Adult PT Treatment/Exercise - 01/18/21 0001      Neck Exercises: Theraband   Other Theraband Exercises money 2 x 10 with red theraband      Neck Exercises: Seated   Other Seated Exercise self first rib mob using bed sheet 1 x 10 holding 5 seconds ea.  Neck Exercises: Supine   Neck Retraction 10 reps;10 secs    Neck Retraction Limitations progressed to chin tuck head lift 1 x 10 hlding 10 seconds   modified to 5 seconds aftr     Manual Therapy   Manual Therapy Joint mobilization;Soft tissue mobilization    Manual therapy comments skilled palpation and monitoring pt throughout TPDN    Joint Mobilization C3-C7 PA Grade III, L UPA grade III, T1 x t6 PA grade III, bil first rib inferior mobs grade III    Soft tissue mobilization IASTM along the L upper trap/ levator scapulae      Neck Exercises: Stretches   Levator Stretch Left;2 reps;30 seconds            Trigger Point Dry Needling - 01/18/21 0001    Consent Given? Yes    Education Handout Provided Previously provided    Muscles Treated Head and Neck Upper trapezius    Electrical Stimulation Performed with Dry Needling Yes    E-stim with Dry Needling Details CPS Lvl 20 x 8 min increasing intensity to tolerance adjusting intermittently    Upper Trapezius Response Twitch reponse elicited;Palpable increased muscle length   L                  PT Short Term Goals - 01/11/21 1030      PT SHORT TERM GOAL #1   Title pt to be I with inital HEP    Time 4    Period Weeks    Status New    Target Date 02/08/21      PT SHORT TERM GOAL #2   Title -             PT Long Term Goals - 01/11/21 1030      PT LONG TERM GOAL #1   Title pt to verbalize/ demo efficient posture and lifting mechanics to reduce and prevent neck pain    Time 8    Period Weeks    Status New    Target Date 03/08/21      PT LONG TERM GOAL #2   Title increase FOTO score to >/=64% to demo improvement in function    Time 8    Period Weeks    Status New    Target Date 03/08/21      PT LONG TERM GOAL #3   Title pt to report no HA for >/= 1 week to promote improvement in condition and QOL    Time 8    Period Weeks    Status New    Target Date 03/08/21      PT LONG TERM GOAL #4   Title increase bil shoulder shoulder strength to >/= 4+/5 to promote efficient posture and lifting mechanics to prevent and reduce shoulder /neck muscle tension    Time 8    Period Weeks    Status New    Target Date 03/08/21      PT LONG TERM GOAL #5   Title pt to be I with all HEP given as of last visit to maintain and progress current level of function    Time 8    Period Weeks    Status New    Target Date 03/08/21                 Plan - 01/18/21 0815    Clinical Impression Statement pt returns to her follow up session reporting improvement in the neck stiffness since the last session. continued  TPDN for the L upper trap combined with e-stim and followed with IASTM techniques and cervicothoracic and first rib mobs. continued working on cervical stability isolating DNF and posterior shoulder activation. end of session she noted    PT Treatment/Interventions ADLs/Self Care Home Management;Cryotherapy;Electrical Stimulation;Iontophoresis 4mg /ml Dexamethasone;Moist Heat;Traction;Ultrasound;Functional mobility training;Therapeutic  activities;Therapeutic exercise;Neuromuscular re-education;Patient/family education;Manual techniques;Passive range of motion;Dry needling;Taping    PT Next Visit Plan Review/ update HEP PRN, response to DN for bvil upper trap with e-stim and continue PRN, posture education, posterior chain strength, update HEP for rib mob.    PT Home Exercise Plan [id number]- levator scapulae stretch, chin tuck, chin tuck with cervical rotation, scapular retraction.    Consulted and Agree with Plan of Care Patient           Patient will benefit from skilled therapeutic intervention in order to improve the following deficits and impairments:  Improper body mechanics,Postural dysfunction,Pain,Increased muscle spasms,Decreased strength,Decreased range of motion  Visit Diagnosis: Cervicalgia  Abnormal posture  Other muscle spasm     Problem List Patient Active Problem List   Diagnosis Date Noted  . Raynaud phenomenon 09/17/2019  . Hand cramps 09/17/2019  . Carpal tunnel syndrome on left 09/17/2019  . Anxiety 07/30/2019  . Rash and nonspecific skin eruption 07/01/2019  . Musculoskeletal strain 03/25/2019  . Visit for screening mammogram 03/04/2019  . Chronic idiopathic constipation 01/08/2019  . Essential hypertension 10/24/2018  . Psychophysiological insomnia 07/07/2018  . Seasonal allergic rhinitis due to pollen 08/08/2017  . Intractable migraine without aura and without status migrainosus 07/02/2016  . Hyperglycemia 11/28/2015  . Pancreas divisum of native pancreas 12/27/2014  . Routine general medical examination at a health care facility 07/30/2012  . GERD (gastroesophageal reflux disease)   . Hypothyroidism 08/12/2009   Starr Lake PT, DPT, LAT, ATC  01/18/21  8:42 AM      [facility] [address] [place], [place], [address] Phone: [phone]   Fax:  [phone]  Name: Chelsey Hunt MRN: [id number] Date of Birth:  10-12-1965

## 2021-01-19 ENCOUNTER — Other Ambulatory Visit (HOSPITAL_COMMUNITY): Payer: Self-pay

## 2021-01-19 ENCOUNTER — Other Ambulatory Visit: Payer: 59

## 2021-01-19 DIAGNOSIS — K219 Gastro-esophageal reflux disease without esophagitis: Secondary | ICD-10-CM | POA: Diagnosis not present

## 2021-01-19 DIAGNOSIS — R1011 Right upper quadrant pain: Secondary | ICD-10-CM | POA: Diagnosis not present

## 2021-01-19 DIAGNOSIS — K581 Irritable bowel syndrome with constipation: Secondary | ICD-10-CM | POA: Diagnosis not present

## 2021-01-19 MED ORDER — OMEPRAZOLE 40 MG PO CPDR
40.0000 mg | DELAYED_RELEASE_CAPSULE | Freq: Two times a day (BID) | ORAL | 1 refills | Status: DC
  Filled 2021-01-19: qty 180, 90d supply, fill #0
  Filled 2021-06-23: qty 180, 90d supply, fill #1

## 2021-01-20 ENCOUNTER — Other Ambulatory Visit (HOSPITAL_COMMUNITY): Payer: Self-pay

## 2021-01-20 MED ORDER — TRIAMCINOLONE ACETONIDE 0.1 % EX CREA
1.0000 | TOPICAL_CREAM | Freq: Two times a day (BID) | CUTANEOUS | 0 refills | Status: DC
Start: 2020-08-19 — End: 2021-02-21
  Filled 2021-01-20: qty 300, 30d supply, fill #0

## 2021-01-23 ENCOUNTER — Other Ambulatory Visit (HOSPITAL_COMMUNITY): Payer: Self-pay

## 2021-01-23 ENCOUNTER — Ambulatory Visit (INDEPENDENT_AMBULATORY_CARE_PROVIDER_SITE_OTHER): Payer: 59 | Admitting: *Deleted

## 2021-01-23 ENCOUNTER — Other Ambulatory Visit: Payer: Self-pay

## 2021-01-23 DIAGNOSIS — M722 Plantar fascial fibromatosis: Secondary | ICD-10-CM

## 2021-01-23 NOTE — Progress Notes (Signed)
Patient presents today to pick up custom molded foot orthotics, diagnosed with plantar fasciitis by Dr. Jacqualyn Posey.   Orthotics were dispensed and fit was satisfactory. Reviewed instructions for break-in and wear. Written instructions given to patient.  Patient will follow up as needed.   [facility] - order # [id number]

## 2021-01-24 DIAGNOSIS — K219 Gastro-esophageal reflux disease without esophagitis: Secondary | ICD-10-CM | POA: Diagnosis not present

## 2021-01-24 DIAGNOSIS — R1011 Right upper quadrant pain: Secondary | ICD-10-CM | POA: Diagnosis not present

## 2021-01-24 DIAGNOSIS — R110 Nausea: Secondary | ICD-10-CM | POA: Diagnosis not present

## 2021-01-26 ENCOUNTER — Other Ambulatory Visit: Payer: Self-pay

## 2021-01-26 ENCOUNTER — Encounter: Payer: Self-pay | Admitting: Physical Therapy

## 2021-01-26 ENCOUNTER — Ambulatory Visit: Payer: 59 | Admitting: Physical Therapy

## 2021-01-26 DIAGNOSIS — R293 Abnormal posture: Secondary | ICD-10-CM | POA: Diagnosis not present

## 2021-01-26 DIAGNOSIS — M542 Cervicalgia: Secondary | ICD-10-CM | POA: Diagnosis not present

## 2021-01-26 DIAGNOSIS — M62838 Other muscle spasm: Secondary | ICD-10-CM

## 2021-01-26 DIAGNOSIS — M5124 Other intervertebral disc displacement, thoracic region: Secondary | ICD-10-CM | POA: Diagnosis not present

## 2021-01-26 NOTE — Therapy (Signed)
[place], [place], [address] Phone: [phone]   Fax:  [phone]  Physical Therapy Treatment  Patient Details  Name: Chelsey Hunt MRN: [id number] Date of Birth: [birth year] Referring Provider (PT): Pieter Partridge, DO   Encounter Date: 01/26/2021   PT End of Session - 01/26/21 1427    Visit Number 3    Number of Visits 9    Date for PT Re-Evaluation 03/08/21    Authorization Type [facility]- FOTO 6th and 10th visit    PT Start Time 1417    PT Stop Time 1500    PT Time Calculation (min) 43 min    Activity Tolerance Patient tolerated treatment well    Behavior During Therapy [facility] for tasks assessed/performed           Past Medical History:  Diagnosis Date  . ACID REFLUX DISEASE 01/03/2008  . Anemia   . ANEMIA, IRON DEFICIENCY 12/23/2008   history  . Anxiety   . Chronic tension type headache 01/13/2009   migraines  . Complication of anesthesia    pt states she just needs a small amount or she will sleep too long  . Dysplasia of cervix, low grade (CIN 1)   . Fibroid   . GERD (gastroesophageal reflux disease)   . HEART MURMUR, SYSTOLIC 24/23/5361   will have echo 06/18/2013  . 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 Irritable bowel syndrome 12/23/2008  . Migraine   . Ovarian cyst   . Ovarian cyst   . THYROID NODULE, RIGHT 04/21/2009    Past Surgical History:  Procedure Laterality Date  . ABDOMINAL HYSTERECTOMY    . COLONOSCOPY    . HYSTEROSCOPY    . LAPAROSCOPY    . NOVASURE ABLATION    . POLYPECTOMY    . TUBAL LIGATION    . UPPER GI ENDOSCOPY    . VAGINAL HYSTERECTOMY N/A 06/24/2013   Procedure: TOTAL HYSTERECTOMY VAGINAL;  Surgeon: Alwyn Pea, MD;  Location: [facility];  Service: Gynecology;  Laterality: N/A;    There were no vitals filed for this visit.   Subjective Assessment - 01/26/21 1419    Subjective "Received orthotics on Monday, wearing about 9 hours per day on the  job. Feeling stiff since Wednesday. Pain is not too bad, about a 2-3/10." Pt reports liking the dry needling/ E-stim treatment from last week.    Currently in Pain? Yes    Pain Score 3     Pain Location Neck    Pain Orientation Posterior    Pain Descriptors / Indicators Aching    Pain Type Chronic pain    Pain Onset More than a month ago    Pain Frequency Intermittent              [facility] PT Assessment - 01/26/21 0001      Assessment   Medical Diagnosis Neck pain    Referring Provider (PT) Tomi Likens, Adam R, DO      AROM   Overall AROM  Within functional limits for tasks performed                         [facility] Adult PT Treatment/Exercise - 01/26/21 0001      Neck Exercises: Theraband   Other Theraband Exercises A's, T's, and Y's 2x10 with YTB      Neck Exercises: Standing   Other Standing Exercises Cervical retraction with rotation with ball at wall 2x10 BIL  Modalities   Modalities Electrical Stimulation      Manual Therapy   Manual therapy comments skilled palpation and monitoring pt throughout TPDN            Trigger Point Dry Needling - 01/26/21 0001    Consent Given? Yes    Education Handout Provided Previously provided    Muscles Treated Head and Neck Upper trapezius    Electrical Stimulation Performed with Dry Needling Yes    E-stim with Dry Needling Details CPS Lvl 20 x 8 min increasing intensity to tolerance adjusting intermittently    Upper Trapezius Response Twitch reponse elicited;Palpable increased muscle length                PT Education - 01/26/21 1425    Education Details Pt educated on importance of implementing a wear schedule for her new orthotics to prevent excessive back soreness.    Person(s) Educated Patient    Methods Explanation    Comprehension Verbalized understanding            PT Short Term Goals - 01/11/21 1030      PT SHORT TERM GOAL #1   Title pt to be I with inital HEP    Time 4    Period Weeks     Status New    Target Date 02/08/21      PT SHORT TERM GOAL #2   Title -             PT Long Term Goals - 01/11/21 1030      PT LONG TERM GOAL #1   Title pt to verbalize/ demo efficient posture and lifting mechanics to reduce and prevent neck pain    Time 8    Period Weeks    Status New    Target Date 03/08/21      PT LONG TERM GOAL #2   Title increase FOTO score to >/=64% to demo improvement in function    Time 8    Period Weeks    Status New    Target Date 03/08/21      PT LONG TERM GOAL #3   Title pt to report no HA for >/= 1 week to promote improvement in condition and QOL    Time 8    Period Weeks    Status New    Target Date 03/08/21      PT LONG TERM GOAL #4   Title increase bil shoulder shoulder strength to >/= 4+/5 to promote efficient posture and lifting mechanics to prevent and reduce shoulder /neck muscle tension    Time 8    Period Weeks    Status New    Target Date 03/08/21      PT LONG TERM GOAL #5   Title pt to be I with all HEP given as of last visit to maintain and progress current level of function    Time 8    Period Weeks    Status New    Target Date 03/08/21                 Plan - 01/26/21 1511    Clinical Impression Statement The pt tolerated all treatment well today, demonstrating the ability to perform all exercises with correct form and without increase in pain. She also demonstrates WNL cervical AROM in all planes without increase in pain. Her primary impairments remain subjective reports of stiffness and pain, although joint mobility of the C-spine was WNL upon assessment of CPA's and  UPA's. She shows improvement with PT, noting that the exercises have been helping to decrease her pain and stiffness (she attributes her current pain and stiffness with the recent addition of BIL insole orthotics which she wore for an entire 9-hour workday shortly after receiving them). She will continue to benefit from skill PT to address her  substantial forward head posture, deep neck flexor endurance, and posterior chain strengthening.    Personal Factors and Comorbidities Comorbidity 2;Past/Current Experience    Comorbidities hx of anxiety, migraines, chronic tension related HA    Stability/Clinical Decision Making Evolving/Moderate complexity    Clinical Decision Making Moderate    Rehab Potential Good    PT Frequency 1x / week    PT Duration 8 weeks    PT Treatment/Interventions ADLs/Self Care Home Management;Cryotherapy;Electrical Stimulation;Iontophoresis 4mg /ml Dexamethasone;Moist Heat;Traction;Ultrasound;Functional mobility training;Therapeutic activities;Therapeutic exercise;Neuromuscular re-education;Patient/family education;Manual techniques;Passive range of motion;Dry needling;Taping    PT Next Visit Plan Review/ update HEP PRN, response to DN for bvil upper trap with e-stim and continue PRN, posture education, posterior chain strength, assess active/ passive cervical ROM, consider discharge if largely unremarkable.    PT Home Exercise Plan [id number]- levator scapulae stretch, chin tuck, chin tuck with cervical rotation, scapular retraction, scapular ATY with YTB, standing cervical retraction with BIL rotation with ball at wall.    Consulted and Agree with Plan of Care Patient           Patient will benefit from skilled therapeutic intervention in order to improve the following deficits and impairments:  Improper body mechanics,Postural dysfunction,Pain,Increased muscle spasms,Decreased strength,Decreased range of motion  Visit Diagnosis: Cervicalgia  Abnormal posture  Other muscle spasm     Problem List Patient Active Problem List   Diagnosis Date Noted  . Raynaud phenomenon 09/17/2019  . Hand cramps 09/17/2019  . Carpal tunnel syndrome on left 09/17/2019  . Anxiety 07/30/2019  . Rash and nonspecific skin eruption 07/01/2019  . Musculoskeletal strain 03/25/2019  . Visit for screening mammogram 03/04/2019   . Chronic idiopathic constipation 01/08/2019  . Essential hypertension 10/24/2018  . Psychophysiological insomnia 07/07/2018  . Seasonal allergic rhinitis due to pollen 08/08/2017  . Intractable migraine without aura and without status migrainosus 07/02/2016  . Hyperglycemia 11/28/2015  . Pancreas divisum of native pancreas 12/27/2014  . Routine general medical examination at a health care facility 07/30/2012  . GERD (gastroesophageal reflux disease)   . Hypothyroidism 08/12/2009   Vanessa Navajo, PT, DPT  01/26/2021, 3:39 PM  [facility] [address] [place], [place], [address] Phone: [phone]   Fax:  [phone]  Name: Claudie Brickhouse MRN: [id number] Date of Birth: Feb 10, 1966

## 2021-02-01 ENCOUNTER — Other Ambulatory Visit: Payer: Self-pay

## 2021-02-01 ENCOUNTER — Ambulatory Visit: Payer: 59 | Admitting: Physical Therapy

## 2021-02-01 ENCOUNTER — Encounter: Payer: Self-pay | Admitting: Physical Therapy

## 2021-02-01 DIAGNOSIS — M542 Cervicalgia: Secondary | ICD-10-CM | POA: Diagnosis not present

## 2021-02-01 DIAGNOSIS — R293 Abnormal posture: Secondary | ICD-10-CM

## 2021-02-01 DIAGNOSIS — M5124 Other intervertebral disc displacement, thoracic region: Secondary | ICD-10-CM | POA: Diagnosis not present

## 2021-02-01 DIAGNOSIS — M62838 Other muscle spasm: Secondary | ICD-10-CM

## 2021-02-01 NOTE — Therapy (Signed)
[place], [place], [address] Phone: [phone]   Fax:  [phone]  Physical Therapy Treatment  Patient Details  Name: Chelsey Hunt MRN: [id number] Date of Birth: 05-11-1966 Referring Provider (PT): Pieter Partridge, DO   Encounter Date: 02/01/2021   PT End of Session - 02/01/21 1410    Visit Number 4    Number of Visits 9    Date for PT Re-Evaluation 03/08/21    Authorization Type [facility]- FOTO 6th and 10th visit    PT Start Time 1415    PT Stop Time 1457    PT Time Calculation (min) 42 min    Activity Tolerance Patient tolerated treatment well           Past Medical History:  Diagnosis Date  . ACID REFLUX DISEASE 01/03/2008  . Anemia   . ANEMIA, IRON DEFICIENCY 12/23/2008   history  . Anxiety   . Chronic tension type headache 01/13/2009   migraines  . Complication of anesthesia    pt states she just needs a small amount or she will sleep too long  . Dysplasia of cervix, low grade (CIN 1)   . Fibroid   . GERD (gastroesophageal reflux disease)   . HEART MURMUR, SYSTOLIC 69/62/9528   will have echo 06/18/2013  . 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 Irritable bowel syndrome 12/23/2008  . Migraine   . Ovarian cyst   . Ovarian cyst   . THYROID NODULE, RIGHT 04/21/2009    Past Surgical History:  Procedure Laterality Date  . ABDOMINAL HYSTERECTOMY    . COLONOSCOPY    . HYSTEROSCOPY    . LAPAROSCOPY    . NOVASURE ABLATION    . POLYPECTOMY    . TUBAL LIGATION    . UPPER GI ENDOSCOPY    . VAGINAL HYSTERECTOMY N/A 06/24/2013   Procedure: TOTAL HYSTERECTOMY VAGINAL;  Surgeon: Alwyn Pea, MD;  Location: [facility];  Service: Gynecology;  Laterality: N/A;    There were no vitals filed for this visit.   Subjective Assessment - 02/01/21 1419    Subjective " I am doing pretty, and feeling better than last time."    Patient Stated Goals reduce stiffness/ pain, reduce HA frequency     Currently in Pain? Yes    Pain Score 1               [facility] PT Assessment - 02/01/21 0001      Assessment   Medical Diagnosis Neck pain    Referring Provider (PT) Pieter Partridge, DO                         [facility] Adult PT Treatment/Exercise - 02/01/21 0001      Neck Exercises: Machines for Strengthening   UBE (Upper Arm Bike) L3 x 75min   fwd/bwd x 2:30     Neck Exercises: Theraband   Rows 20 reps;Green    Shoulder Internal Rotation 15 reps;Green   bil   Horizontal ABduction Red;15 reps   seated on dyna disc   Other Theraband Exercises scaption shoulder flexion 2 x 15 with red theraband   seated on dyna disc.   Other Theraband Exercises money 2 x 15 with red theraband   seated on dyna disc to promote efficient posture     Neck Exercises: Supine   Capital Flexion 10 reps;10 secs   chin tuck head lift   Other Supine Exercise thoraci  foam roll routine: scapular protraction, horzizontal abd/adduction, alternating ceiling punches, x to Y. and back stroke   2 x 15 , second set with feet close together to incorporate core     Lumbar Exercises: Seated   Other Seated Lumbar Exercises alternating marching seated on dyna disc 2 x 10      Neck Exercises: Stretches   Upper Trapezius Stretch Left;1 rep;30 seconds    Levator Stretch 1 rep;Left;30 seconds                    PT Short Term Goals - 01/11/21 1030      PT SHORT TERM GOAL #1   Title pt to be I with inital HEP    Time 4    Period Weeks    Status New    Target Date 02/08/21      PT SHORT TERM GOAL #2   Title -             PT Long Term Goals - 01/11/21 1030      PT LONG TERM GOAL #1   Title pt to verbalize/ demo efficient posture and lifting mechanics to reduce and prevent neck pain    Time 8    Period Weeks    Status New    Target Date 03/08/21      PT LONG TERM GOAL #2   Title increase FOTO score to >/=64% to demo improvement in function    Time 8    Period Weeks    Status New     Target Date 03/08/21      PT LONG TERM GOAL #3   Title pt to report no HA for >/= 1 week to promote improvement in condition and QOL    Time 8    Period Weeks    Status New    Target Date 03/08/21      PT LONG TERM GOAL #4   Title increase bil shoulder shoulder strength to >/= 4+/5 to promote efficient posture and lifting mechanics to prevent and reduce shoulder /neck muscle tension    Time 8    Period Weeks    Status New    Target Date 03/08/21      PT LONG TERM GOAL #5   Title pt to be I with all HEP given as of last visit to maintain and progress current level of function    Time 8    Period Weeks    Status New    Target Date 03/08/21                 Plan - 02/01/21 1457    Clinical Impression Statement pt arrives today reporting decreased pain reated today at 1/10. Due to pt feeling better and imprvoing focused on core and shoulder strengthening utilizing the dyna disc to promote core activation which she did require min cues for proper form. If pt continues to do well next session anticipate d/c.    PT Next Visit Plan Review/ update HEP PRN, review HEP.  consider discharge if largely unremarkable.    PT Home Exercise Plan [id number]- levator scapulae stretch, chin tuck, chin tuck with cervical rotation, scapular retraction, scapular ATY with YTB, standing cervical retraction with BIL rotation with ball at wall.    Consulted and Agree with Plan of Care Patient           Patient will benefit from skilled therapeutic intervention in order to improve the following deficits and impairments:  Improper  body mechanics,Postural dysfunction,Pain,Increased muscle spasms,Decreased strength,Decreased range of motion  Visit Diagnosis: Cervicalgia  Abnormal posture  Other muscle spasm     Problem List Patient Active Problem List   Diagnosis Date Noted  . Raynaud phenomenon 09/17/2019  . Hand cramps 09/17/2019  . Carpal tunnel syndrome on left 09/17/2019  . Anxiety  07/30/2019  . Rash and nonspecific skin eruption 07/01/2019  . Musculoskeletal strain 03/25/2019  . Visit for screening mammogram 03/04/2019  . Chronic idiopathic constipation 01/08/2019  . Essential hypertension 10/24/2018  . Psychophysiological insomnia 07/07/2018  . Seasonal allergic rhinitis due to pollen 08/08/2017  . Intractable migraine without aura and without status migrainosus 07/02/2016  . Hyperglycemia 11/28/2015  . Pancreas divisum of native pancreas 12/27/2014  . Routine general medical examination at a health care facility 07/30/2012  . GERD (gastroesophageal reflux disease)   . Hypothyroidism 08/12/2009    Starr Lake PT, DPT, LAT, ATC  02/01/21  3:01 PM      [facility] [address] [place], [place], [address] Phone: [phone]   Fax:  [phone]  Name: Chelsey Hunt MRN: [id number] Date of Birth: [birth year]

## 2021-02-09 ENCOUNTER — Ambulatory Visit: Payer: 59 | Attending: Family | Admitting: Physical Therapy

## 2021-02-09 ENCOUNTER — Encounter: Payer: Self-pay | Admitting: Physical Therapy

## 2021-02-09 ENCOUNTER — Other Ambulatory Visit: Payer: Self-pay

## 2021-02-09 DIAGNOSIS — R293 Abnormal posture: Secondary | ICD-10-CM | POA: Diagnosis not present

## 2021-02-09 DIAGNOSIS — M62838 Other muscle spasm: Secondary | ICD-10-CM | POA: Insufficient documentation

## 2021-02-09 DIAGNOSIS — M542 Cervicalgia: Secondary | ICD-10-CM | POA: Insufficient documentation

## 2021-02-09 NOTE — Therapy (Addendum)
[place], [place], [address] Phone: [phone]   Fax:  [phone]  Physical Therapy Treatment / Discharge  Patient Details  Name: Chelsey Hunt MRN: [id number] Date of Birth: 1966/02/11 Referring Provider (PT): Pieter Partridge, DO   Encounter Date: 02/09/2021   PT End of Session - 02/09/21 1421    Visit Number 5    Number of Visits 9    Date for PT Re-Evaluation 03/08/21    Authorization Type [facility]- FOTO 6th and 10th visit    PT Start Time 1418    Activity Tolerance Patient tolerated treatment well    Behavior During Therapy [facility] for tasks assessed/performed           Past Medical History:  Diagnosis Date  . ACID REFLUX DISEASE 01/03/2008  . Anemia   . ANEMIA, IRON DEFICIENCY 12/23/2008   history  . Anxiety   . Chronic tension type headache 01/13/2009   migraines  . Complication of anesthesia    pt states she just needs a small amount or she will sleep too long  . Dysplasia of cervix, low grade (CIN 1)   . Fibroid   . GERD (gastroesophageal reflux disease)   . HEART MURMUR, SYSTOLIC 62/94/7654   will have echo 06/18/2013  . 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 Irritable bowel syndrome 12/23/2008  . Migraine   . Ovarian cyst   . Ovarian cyst   . THYROID NODULE, RIGHT 04/21/2009    Past Surgical History:  Procedure Laterality Date  . ABDOMINAL HYSTERECTOMY    . COLONOSCOPY    . HYSTEROSCOPY    . LAPAROSCOPY    . NOVASURE ABLATION    . POLYPECTOMY    . TUBAL LIGATION    . UPPER GI ENDOSCOPY    . VAGINAL HYSTERECTOMY N/A 06/24/2013   Procedure: TOTAL HYSTERECTOMY VAGINAL;  Surgeon: Alwyn Pea, MD;  Location: [facility];  Service: Gynecology;  Laterality: N/A;    There were no vitals filed for this visit.       [facility] PT Assessment - 02/09/21 0001      Assessment   Medical Diagnosis Neck pain    Referring Provider (PT) Pieter Partridge, DO      Observation/Other  Assessments   Focus on Therapeutic Outcomes (FOTO)  68% function      Strength   Right Shoulder Flexion 4+/5    Right Shoulder Extension 5/5    Right Shoulder ABduction 4/5    Right Shoulder Internal Rotation 5/5    Right Shoulder External Rotation 5/5    Left Shoulder Flexion 4+/5    Left Shoulder Extension 5/5    Left Shoulder ABduction 4/5    Left Shoulder Internal Rotation 5/5    Left Shoulder External Rotation 5/5                         [facility] Adult PT Treatment/Exercise - 02/09/21 0001      Neck Exercises: Theraband   Horizontal ABduction Blue;10 reps    Other Theraband Exercises money 1 x 15 with green theraband      Neck Exercises: Stretches   Upper Trapezius Stretch 1 rep;Right;30 seconds    Levator Stretch Right;1 rep;30 seconds   with manual over pressure                 PT Education - 02/09/21 1456    Education Details Reviewed and updated HEP. discussed proper progression  of strengthening increasing reps/ sets and resistance .    Person(s) Educated Patient    Methods Explanation;Verbal cues;Handout    Comprehension Verbalized understanding;Verbal cues required            PT Short Term Goals - 02/09/21 1428      PT SHORT TERM GOAL #1   Title pt to be I with inital HEP    Period Weeks    Status Achieved             PT Long Term Goals - 02/09/21 1428      PT LONG TERM GOAL #1   Title pt to verbalize/ demo efficient posture and lifting mechanics to reduce and prevent neck pain    Period Weeks    Status Achieved      PT LONG TERM GOAL #2   Title increase FOTO score to >/=64% to demo improvement in function    Period Weeks    Status Achieved      PT LONG TERM GOAL #3   Title pt to report no HA for >/= 1 week to promote improvement in condition and QOL    Period Weeks    Status Achieved      PT LONG TERM GOAL #4   Title increase bil shoulder shoulder strength to >/= 4+/5 to promote efficient posture and lifting mechanics  to prevent and reduce shoulder /neck muscle tension    Period Weeks    Status Partially Met      PT LONG TERM GOAL #5   Title pt to be I with all HEP given as of last visit to maintain and progress current level of function    Period Weeks    Status Achieved                 Plan - 02/09/21 1455    Clinical Impression Statement Mrs espey has made excellent progress with physical therapy increasing her shoulder strength and reducing her HA frequency significantly and additionally reports no pain. She was able to perform exercise today with no report of pain or issues. she met or partially met all goals today. she is able to maintain and progress her current LOF IND and will be discharged from PT today.    PT Treatment/Interventions ADLs/Self Care Home Management;Cryotherapy;Electrical Stimulation;Iontophoresis 4mg /ml Dexamethasone;Moist Heat;Traction;Ultrasound;Functional mobility training;Therapeutic activities;Therapeutic exercise;Neuromuscular re-education;Patient/family education;Manual techniques;Passive range of motion;Dry needling;Taping    PT Next Visit Plan d/C    PT Home Exercise Plan [id number]    Consulted and Agree with Plan of Care Patient           Patient will benefit from skilled therapeutic intervention in order to improve the following deficits and impairments:  Improper body mechanics,Postural dysfunction,Pain,Increased muscle spasms,Decreased strength,Decreased range of motion  Visit Diagnosis: Cervicalgia  Abnormal posture  Other muscle spasm     Problem List Patient Active Problem List   Diagnosis Date Noted  . Raynaud phenomenon 09/17/2019  . Hand cramps 09/17/2019  . Carpal tunnel syndrome on left 09/17/2019  . Anxiety 07/30/2019  . Rash and nonspecific skin eruption 07/01/2019  . Musculoskeletal strain 03/25/2019  . Visit for screening mammogram 03/04/2019  . Chronic idiopathic constipation 01/08/2019  . Essential hypertension 10/24/2018   . Psychophysiological insomnia 07/07/2018  . Seasonal allergic rhinitis due to pollen 08/08/2017  . Intractable migraine without aura and without status migrainosus 07/02/2016  . Hyperglycemia 11/28/2015  . Pancreas divisum of native pancreas 12/27/2014  . Routine general medical examination at a health  care facility 07/30/2012  . GERD (gastroesophageal reflux disease)   . Hypothyroidism 08/12/2009    [place] 02/09/2021, 2:57 PM  [facility] [address] [place], [place], [address] Phone: [phone]   Fax:  [phone]  Name: Emberlynn Riggan MRN: [id number] Date of Birth: 1966/06/05      PHYSICAL THERAPY DISCHARGE SUMMARY  Visits from Start of Care: 5  Current functional level related to goals / functional outcomes: See goals, FOTO 68%   Remaining deficits: See assessment   Education / Equipment: HEP, theraband, posture  Plan: Patient agrees to discharge.  Patient goals were partially met. Patient is being discharged due to being pleased with the current functional level.  ?????         Aima Mcwhirt PT, DPT, LAT, ATC  02/09/21  2:59 PM

## 2021-02-20 ENCOUNTER — Other Ambulatory Visit (HOSPITAL_COMMUNITY): Payer: Self-pay

## 2021-02-20 MED FILL — Ubrogepant Tab 100 MG: ORAL | 30 days supply | Qty: 16 | Fill #1 | Status: AC

## 2021-02-21 ENCOUNTER — Encounter: Payer: Self-pay | Admitting: Family

## 2021-02-21 ENCOUNTER — Telehealth: Payer: Self-pay | Admitting: Neurology

## 2021-02-21 ENCOUNTER — Ambulatory Visit: Payer: 59 | Admitting: Family

## 2021-02-21 ENCOUNTER — Other Ambulatory Visit: Payer: Self-pay

## 2021-02-21 VITALS — BP 130/82 | HR 87 | Ht 60.0 in | Wt 130.0 lb

## 2021-02-21 DIAGNOSIS — R5383 Other fatigue: Secondary | ICD-10-CM | POA: Diagnosis not present

## 2021-02-21 DIAGNOSIS — R7309 Other abnormal glucose: Secondary | ICD-10-CM | POA: Diagnosis not present

## 2021-02-21 DIAGNOSIS — E559 Vitamin D deficiency, unspecified: Secondary | ICD-10-CM | POA: Diagnosis not present

## 2021-02-21 DIAGNOSIS — E039 Hypothyroidism, unspecified: Secondary | ICD-10-CM | POA: Diagnosis not present

## 2021-02-21 NOTE — Telephone Encounter (Signed)
Spoke with pt she had the migraine for two days now. The urbelvy not helping for this one.  Per pt she is unable to take Reglan she unsure what problem she had. As for prednisone taper she is allergic.

## 2021-02-21 NOTE — Telephone Encounter (Signed)
Patient has an appt for botox in august, she is having headaches to where it is really bothering her. In back in head, said she is feeling weird/different. Was wondering if she could get an apt prior to the botox. Let her know I would send a message to see if this was urgent.

## 2021-02-21 NOTE — Progress Notes (Signed)
Chelsey Hunt is a 54 y.o. female with the following history as recorded in EpicCare:  Patient Active Problem List   Diagnosis Date Noted   Raynaud phenomenon 09/17/2019   Hand cramps 09/17/2019   Carpal tunnel syndrome on left 09/17/2019   Anxiety 07/30/2019   Rash and nonspecific skin eruption 07/01/2019   Musculoskeletal strain 03/25/2019   Visit for screening mammogram 03/04/2019   Chronic idiopathic constipation 01/08/2019   Essential hypertension 10/24/2018   Psychophysiological insomnia 07/07/2018   Seasonal allergic rhinitis due to pollen 08/08/2017   Intractable migraine without aura and without status migrainosus 07/02/2016   Hyperglycemia 11/28/2015   Pancreas divisum of native pancreas 12/27/2014   Routine general medical examination at a health care facility 07/30/2012   GERD (gastroesophageal reflux disease)    Hypothyroidism 08/12/2009    Current Outpatient Medications  Medication Sig Dispense Refill   acetaminophen (TYLENOL) 325 MG tablet Take 650 mg by mouth every 6 (six) hours as needed for mild pain.     aspirin EC 81 MG tablet Take 81 mg by mouth daily. Swallow whole.     co-enzyme Q-10 30 MG capsule Take 200 mg by mouth 3 (three) times daily.     cyclobenzaprine (FLEXERIL) 5 MG tablet Take 1 tablet (5 mg total) by mouth 3 (three) times daily as needed for muscle spasms. 30 tablet 1   dicyclomine (BENTYL) 10 MG capsule Take by mouth.     ibuprofen (ADVIL,MOTRIN) 200 MG tablet Take 200 mg by mouth every 8 (eight) hours as needed.      omeprazole (PRILOSEC) 40 MG capsule Take 1 capsule (40 mg total) by mouth 2 (two) times daily. 180 capsule 1   promethazine (PHENERGAN) 25 MG tablet Take 1 tablet (25 mg total) by mouth every 6 (six) hours as needed for nausea or vomiting. 30 tablet 5   SYNTHROID 75 MCG tablet TAKE 1 TABLET (75 MCG TOTAL) BY MOUTH DAILY. 90 tablet 3   valACYclovir (VALTREX) 1000 MG tablet TAKE 1 TABLET EVERY 12 HOURS BY MOUTH FOR 5 DAYS 30  tablet 11   No current facility-administered medications for this visit.    Allergies: Imitrex [sumatriptan], Propoxyphene n-acetaminophen, Nabumetone, and Topiramate  Past Medical History:  Diagnosis Date   ACID REFLUX DISEASE 01/03/2008   Anemia    ANEMIA, IRON DEFICIENCY 12/23/2008   history   Anxiety    Chronic tension type headache 01/13/2009   migraines   Complication of anesthesia    pt states she just needs a small amount or she will sleep too long   Dysplasia of cervix, low grade (CIN 1)    Fibroid    GERD (gastroesophageal reflux disease)    HEART MURMUR, SYSTOLIC 07/07/2010   will have echo 06/18/2013   HYPOTHYROIDISM, POST-RADIATION 08/12/2009   Irritable bowel syndrome 12/23/2008   Migraine    Ovarian cyst    Ovarian cyst    THYROID NODULE, RIGHT 04/21/2009    Past Surgical History:  Procedure Laterality Date   ABDOMINAL HYSTERECTOMY     COLONOSCOPY     HYSTEROSCOPY     LAPAROSCOPY     NOVASURE ABLATION     POLYPECTOMY     TUBAL LIGATION     UPPER GI ENDOSCOPY     VAGINAL HYSTERECTOMY N/A 06/24/2013   Procedure: TOTAL HYSTERECTOMY VAGINAL;  Surgeon: Sandra A Rivard, MD;  Location: [facility];  Service: Gynecology;  Laterality: N/A;    Family History  Problem Relation Age of Onset   Goiter   Mother    Heart disease Mother    Thyroid disease Mother    Heart attack Mother    Graves' disease Sister    Asthma Sister    Cancer Father    Heart disease Father    Heart attack Father    CVA Brother    Hypertension Brother    Hypertension Brother    Alcoholism Brother    Stomach cancer Sister    Hepatitis Sister    Stomach cancer Sister    Goiter Other        Siblings 2   Hypertension Paternal Grandfather    Depression Neg Hx    Alcohol abuse Neg Hx    Drug abuse Neg Hx    Early death Neg Hx    Hearing loss Neg Hx    Kidney disease Neg Hx    Stroke Neg Hx     Social History   Tobacco Use   Smoking status: Never   Smokeless tobacco: Never  Substance Use  Topics   Alcohol use: No    Comment: occ    Subjective:   Presents today with increased fatigue; requesting to have labs updated today; last TSH checked in June 2021;  Does work night shift at [employer] but has done this for many years- admits however that sleep pattern is more broken than usual lately; also feels this is contributing to her headaches;      Objective:  Vitals:   02/21/21 1525  BP: 130/82  Pulse: 87  SpO2: 99%  Weight: 130 lb (59 kg)  Height: 5' (1.524 m)    General: Well developed, well nourished, in no acute distress  Skin : Warm and dry.  Head: Normocephalic and atraumatic  Eyes: Sclera and conjunctiva clear; pupils round and reactive to light; extraocular movements intact  Ears: External normal; canals clear; tympanic membranes normal  Oropharynx: Pink, supple. No suspicious lesions  Neck: Supple without thyromegaly, adenopathy  Lungs: Respirations unlabored; clear to auscultation bilaterally without wheeze, rales, rhonchi  CVS exam: normal rate, regular rhythm, normal S1, S2, no murmurs, rubs, clicks or gallops, normal rate and regular rhythm.  Neurologic: Alert and oriented; speech intact; face symmetrical; moves all extremities well; CNII-XII intact without focal deficit   Assessment:  1. Other fatigue   2. Acquired hypothyroidism   3. Vitamin D deficiency   4. Elevated glucose     Plan: Update labs today- follow up to be determined; assuming labs are normal, will need to consider sleep study; follow up to be determined;  This visit occurred during the SARS-CoV-2 public health emergency.  Safety protocols were in place, including screening questions prior to the visit, additional usage of staff PPE, and extensive cleaning of exam room while observing appropriate contact time as indicated for disinfecting solutions.    No follow-ups on file.  Orders Placed This Encounter  Procedures   CBC with Differential/Platelet   Comp Met (CMET)   TSH    B12   Vitamin D (25 hydroxy)   Hemoglobin A1c    Requested Prescriptions    No prescriptions requested or ordered in this encounter     

## 2021-02-22 ENCOUNTER — Encounter: Payer: Self-pay | Admitting: Family

## 2021-02-22 LAB — COMPREHENSIVE METABOLIC PANEL
ALT: 18 U/L (ref 0–35)
AST: 18 U/L (ref 0–37)
Albumin: 4.6 g/dL (ref 3.5–5.2)
Alkaline Phosphatase: 117 U/L (ref 39–117)
BUN: 14 mg/dL (ref 6–23)
CO2: 24 mEq/L (ref 19–32)
Calcium: 10.3 mg/dL (ref 8.4–10.5)
Chloride: 103 mEq/L (ref 96–112)
Creatinine, Ser: 0.97 mg/dL (ref 0.40–1.20)
GFR: 65.67 mL/min (ref >60.00)
Glucose, Bld: 82 mg/dL (ref 70–99)
Potassium: 4.1 mEq/L (ref 3.5–5.1)
Sodium: 140 mEq/L (ref 135–145)
Total Bilirubin: 0.4 mg/dL (ref 0.2–1.2)
Total Protein: 8 g/dL (ref 6.0–8.3)

## 2021-02-22 LAB — CBC WITH DIFFERENTIAL/PLATELET
Basophils Absolute: 0.1 10*3/uL (ref 0.0–0.1)
Basophils Relative: 0.9 % (ref 0.0–3.0)
Eosinophils Absolute: 0.2 10*3/uL (ref 0.0–0.7)
Eosinophils Relative: 2.7 % (ref 0.0–5.0)
HCT: 40.9 % (ref 36.0–46.0)
Hemoglobin: 13.7 g/dL (ref 12.0–15.0)
Lymphocytes Relative: 36.4 % (ref 12.0–46.0)
Lymphs Abs: 2.6 10*3/uL (ref 0.7–4.0)
MCHC: 33.6 g/dL (ref 30.0–36.0)
MCV: 92.7 fl (ref 78.0–100.0)
Monocytes Absolute: 0.4 10*3/uL (ref 0.1–1.0)
Monocytes Relative: 5 % (ref 3.0–12.0)
Neutro Abs: 3.9 10*3/uL (ref 1.4–7.7)
Neutrophils Relative %: 55 % (ref 43.0–77.0)
Platelets: 388 10*3/uL (ref 150.0–400.0)
RBC: 4.41 Mil/uL (ref 3.87–5.11)
RDW: 13.2 % (ref 11.5–15.5)
WBC: 7.1 10*3/uL (ref 4.0–10.5)

## 2021-02-22 LAB — HEMOGLOBIN A1C: Hgb A1c MFr Bld: 6 % (ref 4.6–6.5)

## 2021-02-22 LAB — TSH: TSH: 3.2 u[IU]/mL (ref 0.35–4.50)

## 2021-02-22 LAB — VITAMIN D 25 HYDROXY (VIT D DEFICIENCY, FRACTURES): VITD: 32.51 ng/mL (ref 30.00–100.00)

## 2021-02-22 LAB — VITAMIN B12: Vitamin B-12: 335 pg/mL (ref 211–911)

## 2021-02-22 NOTE — Telephone Encounter (Signed)
Pt to get headache cocktail

## 2021-02-22 NOTE — Telephone Encounter (Signed)
Patient called back and asked for [facility]. She said she is not able to get to the office by 4:00 PM today for a headache cocktail.

## 2021-02-22 NOTE — Telephone Encounter (Signed)
Pt is asking about results that was just placed and drawn yesterday. Once reviewed I will call her back.

## 2021-02-22 NOTE — Telephone Encounter (Signed)
Patient is unable to get her today for injection for cocktail. She is going to call the pharmacy.

## 2021-02-28 ENCOUNTER — Telehealth: Payer: Self-pay | Admitting: Neurology

## 2021-02-28 DIAGNOSIS — I10 Essential (primary) hypertension: Secondary | ICD-10-CM | POA: Diagnosis not present

## 2021-02-28 DIAGNOSIS — M5416 Radiculopathy, lumbar region: Secondary | ICD-10-CM | POA: Diagnosis not present

## 2021-02-28 DIAGNOSIS — Z1389 Encounter for screening for other disorder: Secondary | ICD-10-CM | POA: Diagnosis not present

## 2021-02-28 DIAGNOSIS — G43909 Migraine, unspecified, not intractable, without status migrainosus: Secondary | ICD-10-CM | POA: Diagnosis not present

## 2021-02-28 DIAGNOSIS — M5412 Radiculopathy, cervical region: Secondary | ICD-10-CM | POA: Diagnosis not present

## 2021-02-28 NOTE — Telephone Encounter (Signed)
Telephone call to the pt, Pt wants to try the Headache Cocktail.   Per pt this headache is different she having ringing in her ears, Tingling in her finger and hands, pain in there temple,and back of her head.    Please advise if she should come in for a visit or headache cocktail.

## 2021-02-28 NOTE — Telephone Encounter (Signed)
Pt would like a call back regarding her headaches. Set an apt for 11/15 but was wondering if something could be done prior. [phone]

## 2021-03-01 DIAGNOSIS — M9901 Segmental and somatic dysfunction of cervical region: Secondary | ICD-10-CM | POA: Diagnosis not present

## 2021-03-01 DIAGNOSIS — M5442 Lumbago with sciatica, left side: Secondary | ICD-10-CM | POA: Diagnosis not present

## 2021-03-01 DIAGNOSIS — M9903 Segmental and somatic dysfunction of lumbar region: Secondary | ICD-10-CM | POA: Diagnosis not present

## 2021-03-01 DIAGNOSIS — M4724 Other spondylosis with radiculopathy, thoracic region: Secondary | ICD-10-CM | POA: Diagnosis not present

## 2021-03-01 DIAGNOSIS — M9902 Segmental and somatic dysfunction of thoracic region: Secondary | ICD-10-CM | POA: Diagnosis not present

## 2021-03-01 DIAGNOSIS — M530 Cervicocranial syndrome: Secondary | ICD-10-CM | POA: Diagnosis not present

## 2021-03-01 NOTE — Telephone Encounter (Signed)
I got patient Botox appt moved to 03-17-21 and got her on the sch for headache cocktail for 03-02-21

## 2021-03-01 NOTE — Telephone Encounter (Signed)
Pt aware to reschedule her botox from August to July the 8th at 1:50 pm and schedule a Headache cocktail for Thursday 23 rd.

## 2021-03-02 ENCOUNTER — Ambulatory Visit (INDEPENDENT_AMBULATORY_CARE_PROVIDER_SITE_OTHER): Payer: 59

## 2021-03-02 ENCOUNTER — Other Ambulatory Visit: Payer: Self-pay

## 2021-03-02 DIAGNOSIS — M9901 Segmental and somatic dysfunction of cervical region: Secondary | ICD-10-CM | POA: Diagnosis not present

## 2021-03-02 DIAGNOSIS — M9903 Segmental and somatic dysfunction of lumbar region: Secondary | ICD-10-CM | POA: Diagnosis not present

## 2021-03-02 DIAGNOSIS — G43019 Migraine without aura, intractable, without status migrainosus: Secondary | ICD-10-CM

## 2021-03-02 DIAGNOSIS — M530 Cervicocranial syndrome: Secondary | ICD-10-CM | POA: Diagnosis not present

## 2021-03-02 DIAGNOSIS — M5442 Lumbago with sciatica, left side: Secondary | ICD-10-CM | POA: Diagnosis not present

## 2021-03-02 DIAGNOSIS — M4724 Other spondylosis with radiculopathy, thoracic region: Secondary | ICD-10-CM | POA: Diagnosis not present

## 2021-03-02 DIAGNOSIS — M9902 Segmental and somatic dysfunction of thoracic region: Secondary | ICD-10-CM | POA: Diagnosis not present

## 2021-03-02 MED ORDER — KETOROLAC TROMETHAMINE 60 MG/2ML IM SOLN
60.0000 mg | Freq: Once | INTRAMUSCULAR | Status: AC
  Administered 2021-03-02: 60 mg via INTRAMUSCULAR

## 2021-03-02 NOTE — Progress Notes (Signed)
Pt came in today for headache cocktail stated that she was unable to tolerate Reglan so pt was only given Toradol 60mg  pt tolerated it well. Pt driver her was her husband. This nurse put in her allergies that Reglan causes her heart to race so that pt will not get that medication.

## 2021-03-07 DIAGNOSIS — M4724 Other spondylosis with radiculopathy, thoracic region: Secondary | ICD-10-CM | POA: Diagnosis not present

## 2021-03-07 DIAGNOSIS — M9903 Segmental and somatic dysfunction of lumbar region: Secondary | ICD-10-CM | POA: Diagnosis not present

## 2021-03-07 DIAGNOSIS — M5442 Lumbago with sciatica, left side: Secondary | ICD-10-CM | POA: Diagnosis not present

## 2021-03-07 DIAGNOSIS — M9901 Segmental and somatic dysfunction of cervical region: Secondary | ICD-10-CM | POA: Diagnosis not present

## 2021-03-07 DIAGNOSIS — M9902 Segmental and somatic dysfunction of thoracic region: Secondary | ICD-10-CM | POA: Diagnosis not present

## 2021-03-07 DIAGNOSIS — M530 Cervicocranial syndrome: Secondary | ICD-10-CM | POA: Diagnosis not present

## 2021-03-08 ENCOUNTER — Ambulatory Visit: Payer: 59 | Admitting: Emergency Medicine

## 2021-03-08 ENCOUNTER — Encounter: Payer: Self-pay | Admitting: Emergency Medicine

## 2021-03-08 ENCOUNTER — Other Ambulatory Visit: Payer: Self-pay | Admitting: Emergency Medicine

## 2021-03-08 ENCOUNTER — Other Ambulatory Visit: Payer: Self-pay

## 2021-03-08 VITALS — BP 110/86 | HR 74 | Temp 98.5°F | Ht 60.0 in | Wt 128.0 lb

## 2021-03-08 DIAGNOSIS — Z13228 Encounter for screening for other metabolic disorders: Secondary | ICD-10-CM

## 2021-03-08 DIAGNOSIS — Z130 Encounter for screening for diseases of the blood and blood-forming organs and certain disorders involving the immune mechanism: Secondary | ICD-10-CM

## 2021-03-08 DIAGNOSIS — M5442 Lumbago with sciatica, left side: Secondary | ICD-10-CM | POA: Diagnosis not present

## 2021-03-08 DIAGNOSIS — Z8669 Personal history of other diseases of the nervous system and sense organs: Secondary | ICD-10-CM

## 2021-03-08 DIAGNOSIS — E785 Hyperlipidemia, unspecified: Secondary | ICD-10-CM

## 2021-03-08 DIAGNOSIS — Z13220 Encounter for screening for lipoid disorders: Secondary | ICD-10-CM

## 2021-03-08 DIAGNOSIS — Z8639 Personal history of other endocrine, nutritional and metabolic disease: Secondary | ICD-10-CM | POA: Diagnosis not present

## 2021-03-08 DIAGNOSIS — M9903 Segmental and somatic dysfunction of lumbar region: Secondary | ICD-10-CM | POA: Diagnosis not present

## 2021-03-08 DIAGNOSIS — Z8719 Personal history of other diseases of the digestive system: Secondary | ICD-10-CM | POA: Diagnosis not present

## 2021-03-08 DIAGNOSIS — K581 Irritable bowel syndrome with constipation: Secondary | ICD-10-CM | POA: Diagnosis not present

## 2021-03-08 DIAGNOSIS — R5382 Chronic fatigue, unspecified: Secondary | ICD-10-CM | POA: Diagnosis not present

## 2021-03-08 DIAGNOSIS — M530 Cervicocranial syndrome: Secondary | ICD-10-CM | POA: Diagnosis not present

## 2021-03-08 DIAGNOSIS — Z0000 Encounter for general adult medical examination without abnormal findings: Secondary | ICD-10-CM | POA: Diagnosis not present

## 2021-03-08 DIAGNOSIS — M9901 Segmental and somatic dysfunction of cervical region: Secondary | ICD-10-CM | POA: Diagnosis not present

## 2021-03-08 DIAGNOSIS — M9902 Segmental and somatic dysfunction of thoracic region: Secondary | ICD-10-CM | POA: Diagnosis not present

## 2021-03-08 DIAGNOSIS — R29818 Other symptoms and signs involving the nervous system: Secondary | ICD-10-CM | POA: Diagnosis not present

## 2021-03-08 DIAGNOSIS — M4724 Other spondylosis with radiculopathy, thoracic region: Secondary | ICD-10-CM | POA: Diagnosis not present

## 2021-03-08 DIAGNOSIS — Z1329 Encounter for screening for other suspected endocrine disorder: Secondary | ICD-10-CM | POA: Diagnosis not present

## 2021-03-08 LAB — LIPID PANEL
Cholesterol: 253 mg/dL — ABNORMAL HIGH (ref 0–200)
HDL: 69.1 mg/dL (ref >39.00)
LDL Cholesterol: 169 mg/dL — ABNORMAL HIGH (ref 0–99)
NonHDL: 184.3
Total CHOL/HDL Ratio: 4
Triglycerides: 79 mg/dL (ref 0.0–149.0)
VLDL: 15.8 mg/dL (ref 0.0–40.0)

## 2021-03-08 LAB — SEDIMENTATION RATE: Sed Rate: 40 mm/hr — ABNORMAL HIGH (ref 0–30)

## 2021-03-08 MED ORDER — ROSUVASTATIN CALCIUM 10 MG PO TABS
10.0000 mg | ORAL_TABLET | Freq: Every day | ORAL | 3 refills | Status: DC
Start: 2021-03-08 — End: 2021-05-09
  Filled 2021-03-08: qty 90, 90d supply, fill #0

## 2021-03-08 NOTE — Patient Instructions (Signed)
Health Maintenance, Female Adopting a healthy lifestyle and getting preventive care are important in promoting health and wellness. Ask your health care provider about: The right schedule for you to have regular tests and exams. Things you can do on your own to prevent diseases and keep yourself healthy. What should I know about diet, weight, and exercise? Eat a healthy diet  Eat a diet that includes plenty of vegetables, fruits, low-fat dairy products, and lean protein. Do not eat a lot of foods that are high in solid fats, added sugars, or sodium.  Maintain a healthy weight Body mass index (BMI) is used to identify weight problems. It estimates body fat based on height and weight. Your health care provider can help determineyour BMI and help you achieve or maintain a healthy weight. Get regular exercise Get regular exercise. This is one of the most important things you can do for your health. Most adults should: Exercise for at least 150 minutes each week. The exercise should increase your heart rate and make you sweat (moderate-intensity exercise). Do strengthening exercises at least twice a week. This is in addition to the moderate-intensity exercise. Spend less time sitting. Even light physical activity can be beneficial. Watch cholesterol and blood lipids Have your blood tested for lipids and cholesterol at 56 years of age, then havethis test every 5 years. Have your cholesterol levels checked more often if: Your lipid or cholesterol levels are high. You are older than 56 years of age. You are at high risk for heart disease. What should I know about cancer screening? Depending on your health history and family history, you may need to have cancer screening at various ages. This may include screening for: Breast cancer. Cervical cancer. Colorectal cancer. Skin cancer. Lung cancer. What should I know about heart disease, diabetes, and high blood pressure? Blood pressure and heart  disease High blood pressure causes heart disease and increases the risk of stroke. This is more likely to develop in people who have high blood pressure readings, are of African descent, or are overweight. Have your blood pressure checked: Every 3-5 years if you are 18-39 years of age. Every year if you are 40 years old or older. Diabetes Have regular diabetes screenings. This checks your fasting blood sugar level. Have the screening done: Once every three years after age 40 if you are at a normal weight and have a low risk for diabetes. More often and at a younger age if you are overweight or have a high risk for diabetes. What should I know about preventing infection? Hepatitis B If you have a higher risk for hepatitis B, you should be screened for this virus. Talk with your health care provider to find out if you are at risk forhepatitis B infection. Hepatitis C Testing is recommended for: Everyone born from 1945 through 1965. Anyone with known risk factors for hepatitis C. Sexually transmitted infections (STIs) Get screened for STIs, including gonorrhea and chlamydia, if: You are sexually active and are younger than 56 years of age. You are older than 56 years of age and your health care provider tells you that you are at risk for this type of infection. Your sexual activity has changed since you were last screened, and you are at increased risk for chlamydia or gonorrhea. Ask your health care provider if you are at risk. Ask your health care provider about whether you are at high risk for HIV. Your health care provider may recommend a prescription medicine to help   prevent HIV infection. If you choose to take medicine to prevent HIV, you should first get tested for HIV. You should then be tested every 3 months for as long as you are taking the medicine. Pregnancy If you are about to stop having your period (premenopausal) and you may become pregnant, seek counseling before you get  pregnant. Take 400 to 800 micrograms (mcg) of folic acid every day if you become pregnant. Ask for birth control (contraception) if you want to prevent pregnancy. Osteoporosis and menopause Osteoporosis is a disease in which the bones lose minerals and strength with aging. This can result in bone fractures. If you are 65 years old or older, or if you are at risk for osteoporosis and fractures, ask your health care provider if you should: Be screened for bone loss. Take a calcium or vitamin D supplement to lower your risk of fractures. Be given hormone replacement therapy (HRT) to treat symptoms of menopause. Follow these instructions at home: Lifestyle Do not use any products that contain nicotine or tobacco, such as cigarettes, e-cigarettes, and chewing tobacco. If you need help quitting, ask your health care provider. Do not use street drugs. Do not share needles. Ask your health care provider for help if you need support or information about quitting drugs. Alcohol use Do not drink alcohol if: Your health care provider tells you not to drink. You are pregnant, may be pregnant, or are planning to become pregnant. If you drink alcohol: Limit how much you use to 0-1 drink a day. Limit intake if you are breastfeeding. Be aware of how much alcohol is in your drink. In the [place] one drink equals one 12 oz bottle of beer (355 mL), one 5 oz glass of wine (148 mL), or one 1 oz glass of hard liquor (44 mL). General instructions Schedule regular health, dental, and eye exams. Stay current with your vaccines. Tell your health care provider if: You often feel depressed. You have ever been abused or do not feel safe at home. Summary Adopting a healthy lifestyle and getting preventive care are important in promoting health and wellness. Follow your health care provider's instructions about healthy diet, exercising, and getting tested or screened for diseases. Follow your health care provider's  instructions on monitoring your cholesterol and blood pressure. This information is not intended to replace advice given to you by your health care provider. Make sure you discuss any questions you have with your healthcare provider. Document Revised: 08/20/2018 Document Reviewed: 08/20/2018 Elsevier Patient Education  2022 [employer]  

## 2021-03-08 NOTE — Progress Notes (Signed)
Chelsey Hunt 55 y.o.   Chief Complaint  Patient presents with   Transitions Of Care    And CPE    HISTORY OF PRESENT ILLNESS: This is a 55 y.o. female here to establish care with me and annual exam. Past medical history includes migraines, GERD, IBS C and hypothyroidism. Works night shifts.  Has chronic sleeping problems. Up-to-date with mammogram and colonoscopy. Non-smoker and no EtOH user. Recently seen for fatigue about 2 weeks ago.  Unremarkable blood work with acceptable values. A1c at prediabetic level.  HPI   Prior to Admission medications   Medication Sig Start Date End Date Taking? Authorizing Provider  acetaminophen (TYLENOL) 325 MG tablet Take 650 mg by mouth every 6 (six) hours as needed for mild pain.   Yes [provider]  aspirin EC 81 MG tablet Take 81 mg by mouth daily. Swallow whole.   Yes [provider]  co-enzyme Q-10 30 MG capsule Take 200 mg by mouth 3 (three) times daily.   Yes [provider]  cyclobenzaprine (FLEXERIL) 5 MG tablet Take 1 tablet (5 mg total) by mouth 3 (three) times daily as needed for muscle spasms. 04/10/19  Yes Marrian Salvage, FNP  dicyclomine (BENTYL) 10 MG capsule Take by mouth. 04/20/20 04/20/21 Yes [provider]  ibuprofen (ADVIL,MOTRIN) 200 MG tablet Take 200 mg by mouth every 8 (eight) hours as needed.    Yes [provider]  omeprazole (PRILOSEC) 40 MG capsule Take 1 capsule (40 mg total) by mouth 2 (two) times daily. 01/19/21  Yes   promethazine (PHENERGAN) 25 MG tablet Take 1 tablet (25 mg total) by mouth every 6 (six) hours as needed for nausea or vomiting. 10/12/19  Yes Jaffe, Adam R, DO  SYNTHROID 75 MCG tablet TAKE 1 TABLET (75 MCG TOTAL) BY MOUTH DAILY. 03/08/20 04/04/21 Yes Marrian Salvage, FNP  valACYclovir (VALTREX) 1000 MG tablet TAKE 1 TABLET EVERY 12 HOURS BY MOUTH FOR 5 DAYS 08/23/20 08/23/21 Yes Rivard, Katharine Look, MD    Allergies  Allergen Reactions    Imitrex [Sumatriptan] Palpitations   Reglan [Metoclopramide] Palpitations   Propoxyphene N-Acetaminophen    Nabumetone Rash    REACTION: GI upset   Topiramate Rash    REACTION: rash    Patient Active Problem List   Diagnosis Date Noted   Raynaud phenomenon 09/17/2019   Hand cramps 09/17/2019   Carpal tunnel syndrome on left 09/17/2019   Anxiety 07/30/2019   Rash and nonspecific skin eruption 07/01/2019   Musculoskeletal strain 03/25/2019   Visit for screening mammogram 03/04/2019   Chronic idiopathic constipation 01/08/2019   Essential hypertension 10/24/2018   Psychophysiological insomnia 07/07/2018   Seasonal allergic rhinitis due to pollen 08/08/2017   Intractable migraine without aura and without status migrainosus 07/02/2016   Hyperglycemia 11/28/2015   Pancreas divisum of native pancreas 12/27/2014   Routine general medical examination at a health care facility 07/30/2012   GERD (gastroesophageal reflux disease)    Hypothyroidism 08/12/2009    Past Medical History:  Diagnosis Date   ACID REFLUX DISEASE 01/03/2008   Anemia    ANEMIA, IRON DEFICIENCY 12/23/2008   history   Anxiety    Chronic tension type headache 01/13/2009   migraines   Complication of anesthesia    pt states she just needs a small amount or she will sleep too long   Dysplasia of cervix, low grade (CIN 1)    Fibroid    GERD (gastroesophageal reflux disease)    HEART MURMUR, SYSTOLIC  07/07/2010   will have echo 06/18/2013   HYPOTHYROIDISM, POST-RADIATION 08/12/2009   Irritable bowel syndrome 12/23/2008   Migraine    Ovarian cyst    Ovarian cyst    THYROID NODULE, RIGHT 04/21/2009    Past Surgical History:  Procedure Laterality Date   ABDOMINAL HYSTERECTOMY     COLONOSCOPY     HYSTEROSCOPY     LAPAROSCOPY     NOVASURE ABLATION     POLYPECTOMY     TUBAL LIGATION     UPPER GI ENDOSCOPY     VAGINAL HYSTERECTOMY N/A 06/24/2013   Procedure: TOTAL HYSTERECTOMY VAGINAL;  Surgeon: Alwyn Pea,  MD;  Location: [facility];  Service: Gynecology;  Laterality: N/A;    Social History   Socioeconomic History   Marital status: Married    Spouse name: Shanon Brow   Number of children: 3   Years of education: Not on file   Highest education level: Associate degree: occupational, Hotel manager, or vocational program  Occupational History   Occupation: WOMENS    Employer: [employer]  Tobacco Use   Smoking status: Never   Smokeless tobacco: Never  Vaping Use   Vaping Use: Never used  Substance and Sexual Activity   Alcohol use: No    Comment: occ   Drug use: No   Sexual activity: Yes    Birth control/protection: Surgical    Comment: BTL  Other Topics Concern   Not on file  Social History Narrative   Regular exercise-yes      Patient is right-handed.She lives with her husband in a 1 story home. She has recently been avoiding caffeine. Walks.   Social Determinants of Health   Financial Resource Strain: Not on file  Food Insecurity: Not on file  Transportation Needs: Not on file  Physical Activity: Not on file  Stress: Not on file  Social Connections: Not on file  Intimate Partner Violence: Not on file    Family History  Problem Relation Age of Onset   Goiter Mother    Heart disease Mother    Thyroid disease Mother    Heart attack Mother    Berenice Primas' disease Sister    Asthma Sister    Cancer Father    Heart disease Father    Heart attack Father    CVA Brother    Hypertension Brother    Hypertension Brother    Alcoholism Brother    Stomach cancer Sister    Hepatitis Sister    Stomach cancer Sister    Goiter Other        Siblings 2   Hypertension Paternal Grandfather    Depression Neg Hx    Alcohol abuse Neg Hx    Drug abuse Neg Hx    Early death Neg Hx    Hearing loss Neg Hx    Kidney disease Neg Hx    Stroke Neg Hx      Review of Systems  Constitutional: Negative.  Negative for chills and fever.  HENT: Negative.  Negative for congestion and sore throat.    Respiratory: Negative.  Negative for cough and shortness of breath.   Cardiovascular:  Positive for palpitations. Negative for chest pain.  Gastrointestinal:  Positive for constipation. Negative for abdominal pain, diarrhea, nausea and vomiting.  Genitourinary:  Negative for dysuria and hematuria.  Skin: Negative.  Negative for rash.  Neurological:  Positive for headaches. Negative for dizziness.  Psychiatric/Behavioral:  The patient has insomnia.   All other systems reviewed and are negative.  Physical Exam Vitals reviewed.  Constitutional:      Appearance: Normal appearance.  HENT:     Head: Normocephalic.     Right Ear: Tympanic membrane, ear canal and external ear normal.     Left Ear: Tympanic membrane, ear canal and external ear normal.  Eyes:     Extraocular Movements: Extraocular movements intact.     Conjunctiva/sclera: Conjunctivae normal.     Pupils: Pupils are equal, round, and reactive to light.  Cardiovascular:     Rate and Rhythm: Normal rate and regular rhythm.     Pulses: Normal pulses.     Heart sounds: Normal heart sounds.  Pulmonary:     Effort: Pulmonary effort is normal.     Breath sounds: Normal breath sounds.  Abdominal:     General: Bowel sounds are normal. There is no distension.     Palpations: Abdomen is soft. There is no mass.     Tenderness: There is no abdominal tenderness.  Musculoskeletal:        General: Normal range of motion.     Cervical back: Normal range of motion and neck supple.     Right lower leg: No edema.     Left lower leg: No edema.  Skin:    General: Skin is warm and dry.     Capillary Refill: Capillary refill takes less than 2 seconds.  Neurological:     General: No focal deficit present.     Mental Status: She is alert and oriented to person, place, and time.  Psychiatric:        Mood and Affect: Mood normal.        Behavior: Behavior normal.     ASSESSMENT & PLAN: Shametra was seen today for transitions of  care.  Diagnoses and all orders for this visit:  Routine general medical examination at a health care facility  Suspected sleep apnea -     Ambulatory referral to Neurology  History of hypothyroidism -     Cancel: TSH  History of gastroesophageal reflux (GERD)  Irritable bowel syndrome with constipation  History of migraine  Screening for deficiency anemia -     Cancel: CBC with Differential  Screening for lipoid disorders -     Cancel: Hemoglobin A1c -     Lipid panel  Screening for endocrine, metabolic and immunity disorder -     Cancel: Comprehensive metabolic panel -     Cancel: Hemoglobin A1c  Chronic fatigue -     Sedimentation rate -     ANA,IFA RA Diag Pnl w/rflx Tit/Patn   Modifiable risk factors discussed with patient. Anticipatory guidance according to age provided. The following topics were also discussed: Diet and nutrition and need to decrease amount of carbohydrates in her daily diet Benefits of exercise Cancer screening and possible colonoscopy and or mammogram Vaccinations Cardiovascular risk assessment Mental health including depression and anxiety and mind-body connection Fall and accident prevention Chronic insomnia, sleep hygiene, and need for sleep studies Chronic fatigue and possibility of sleep apnea.   Patient Instructions  Health Maintenance, Female Adopting a healthy lifestyle and getting preventive care are important in promoting health and wellness. Ask your health care provider about: The right schedule for you to have regular tests and exams. Things you can do on your own to prevent diseases and keep yourself healthy. What should I know about diet, weight, and exercise? Eat a healthy diet  Eat a diet that includes plenty of vegetables, fruits, low-fat dairy products,  and lean protein. Do not eat a lot of foods that are high in solid fats, added sugars, or sodium.  Maintain a healthy weight Body mass index (BMI) is used to  identify weight problems. It estimates body fat based on height and weight. Your health care provider can help determineyour BMI and help you achieve or maintain a healthy weight. Get regular exercise Get regular exercise. This is one of the most important things you can do for your health. Most adults should: Exercise for at least 150 minutes each week. The exercise should increase your heart rate and make you sweat (moderate-intensity exercise). Do strengthening exercises at least twice a week. This is in addition to the moderate-intensity exercise. Spend less time sitting. Even light physical activity can be beneficial. Watch cholesterol and blood lipids Have your blood tested for lipids and cholesterol at 56 years of age, then havethis test every 5 years. Have your cholesterol levels checked more often if: Your lipid or cholesterol levels are high. You are older than 56 years of age. You are at high risk for heart disease. What should I know about cancer screening? Depending on your health history and family history, you may need to have cancer screening at various ages. This may include screening for: Breast cancer. Cervical cancer. Colorectal cancer. Skin cancer. Lung cancer. What should I know about heart disease, diabetes, and high blood pressure? Blood pressure and heart disease High blood pressure causes heart disease and increases the risk of stroke. This is more likely to develop in people who have high blood pressure readings, are of African descent, or are overweight. Have your blood pressure checked: Every 3-5 years if you are 85-71 years of age. Every year if you are 1 years old or older. Diabetes Have regular diabetes screenings. This checks your fasting blood sugar level. Have the screening done: Once every three years after age 72 if you are at a normal weight and have a low risk for diabetes. More often and at a younger age if you are overweight or have a high risk  for diabetes. What should I know about preventing infection? Hepatitis B If you have a higher risk for hepatitis B, you should be screened for this virus. Talk with your health care provider to find out if you are at risk forhepatitis B infection. Hepatitis C Testing is recommended for: Everyone born from [birth year] through 1966. Anyone with known risk factors for hepatitis C. Sexually transmitted infections (STIs) Get screened for STIs, including gonorrhea and chlamydia, if: You are sexually active and are younger than 56 years of age. You are older than 56 years of age and your health care provider tells you that you are at risk for this type of infection. Your sexual activity has changed since you were last screened, and you are at increased risk for chlamydia or gonorrhea. Ask your health care provider if you are at risk. Ask your health care provider about whether you are at high risk for HIV. Your health care provider may recommend a prescription medicine to help prevent HIV infection. If you choose to take medicine to prevent HIV, you should first get tested for HIV. You should then be tested every 3 months for as long as you are taking the medicine. Pregnancy If you are about to stop having your period (premenopausal) and you may become pregnant, seek counseling before you get pregnant. Take 400 to 800 micrograms (mcg) of folic acid every day if you become pregnant. Ask  for birth control (contraception) if you want to prevent pregnancy. Osteoporosis and menopause Osteoporosis is a disease in which the bones lose minerals and strength with aging. This can result in bone fractures. If you are 53 years old or older, or if you are at risk for osteoporosis and fractures, ask your health care provider if you should: Be screened for bone loss. Take a calcium or vitamin D supplement to lower your risk of fractures. Be given hormone replacement therapy (HRT) to treat symptoms of menopause. Follow  these instructions at home: Lifestyle Do not use any products that contain nicotine or tobacco, such as cigarettes, e-cigarettes, and chewing tobacco. If you need help quitting, ask your health care provider. Do not use street drugs. Do not share needles. Ask your health care provider for help if you need support or information about quitting drugs. Alcohol use Do not drink alcohol if: Your health care provider tells you not to drink. You are pregnant, may be pregnant, or are planning to become pregnant. If you drink alcohol: Limit how much you use to 0-1 drink a day. Limit intake if you are breastfeeding. Be aware of how much alcohol is in your drink. In the [place] one drink equals one 12 oz bottle of beer (355 mL), one 5 oz glass of wine (148 mL), or one 1 oz glass of hard liquor (44 mL). General instructions Schedule regular health, dental, and eye exams. Stay current with your vaccines. Tell your health care provider if: You often feel depressed. You have ever been abused or do not feel safe at home. Summary Adopting a healthy lifestyle and getting preventive care are important in promoting health and wellness. Follow your health care provider's instructions about healthy diet, exercising, and getting tested or screened for diseases. Follow your health care provider's instructions on monitoring your cholesterol and blood pressure. This information is not intended to replace advice given to you by your health care provider. Make sure you discuss any questions you have with your healthcare provider. Document Revised: 08/20/2018 Document Reviewed: 08/20/2018 Elsevier Patient Education  2022 [place], MD [facility]

## 2021-03-09 ENCOUNTER — Other Ambulatory Visit (HOSPITAL_COMMUNITY): Payer: Self-pay

## 2021-03-10 DIAGNOSIS — M9901 Segmental and somatic dysfunction of cervical region: Secondary | ICD-10-CM | POA: Diagnosis not present

## 2021-03-10 DIAGNOSIS — M5442 Lumbago with sciatica, left side: Secondary | ICD-10-CM | POA: Diagnosis not present

## 2021-03-10 DIAGNOSIS — M530 Cervicocranial syndrome: Secondary | ICD-10-CM | POA: Diagnosis not present

## 2021-03-10 DIAGNOSIS — M9902 Segmental and somatic dysfunction of thoracic region: Secondary | ICD-10-CM | POA: Diagnosis not present

## 2021-03-10 DIAGNOSIS — M4724 Other spondylosis with radiculopathy, thoracic region: Secondary | ICD-10-CM | POA: Diagnosis not present

## 2021-03-10 DIAGNOSIS — M9903 Segmental and somatic dysfunction of lumbar region: Secondary | ICD-10-CM | POA: Diagnosis not present

## 2021-03-13 LAB — ANA,IFA RA DIAG PNL W/RFLX TIT/PATN
Anti Nuclear Antibody (ANA): POSITIVE — AB
Cyclic Citrullin Peptide Ab: 55 U — ABNORMAL HIGH
Rheumatoid fact SerPl-aCnc: <14 [IU]/mL (ref <14)

## 2021-03-13 LAB — ANTI-NUCLEAR AB-TITER (ANA TITER): ANA Titer 1: 1:320 {titer} — ABNORMAL HIGH

## 2021-03-14 ENCOUNTER — Other Ambulatory Visit: Payer: Self-pay | Admitting: Emergency Medicine

## 2021-03-14 DIAGNOSIS — Z0279 Encounter for issue of other medical certificate: Secondary | ICD-10-CM

## 2021-03-14 DIAGNOSIS — R768 Other specified abnormal immunological findings in serum: Secondary | ICD-10-CM

## 2021-03-14 DIAGNOSIS — M9903 Segmental and somatic dysfunction of lumbar region: Secondary | ICD-10-CM | POA: Diagnosis not present

## 2021-03-14 DIAGNOSIS — M5442 Lumbago with sciatica, left side: Secondary | ICD-10-CM | POA: Diagnosis not present

## 2021-03-14 DIAGNOSIS — M9901 Segmental and somatic dysfunction of cervical region: Secondary | ICD-10-CM | POA: Diagnosis not present

## 2021-03-14 DIAGNOSIS — M4724 Other spondylosis with radiculopathy, thoracic region: Secondary | ICD-10-CM | POA: Diagnosis not present

## 2021-03-14 DIAGNOSIS — R7989 Other specified abnormal findings of blood chemistry: Secondary | ICD-10-CM

## 2021-03-14 DIAGNOSIS — M9902 Segmental and somatic dysfunction of thoracic region: Secondary | ICD-10-CM | POA: Diagnosis not present

## 2021-03-14 DIAGNOSIS — M530 Cervicocranial syndrome: Secondary | ICD-10-CM | POA: Diagnosis not present

## 2021-03-14 NOTE — Progress Notes (Signed)
Call patient please.  Positive ANA and CCP tests indicating possible rheumatologic condition like lupus.  Needs evaluation by rheumatologist.  Referral placed today.  Thanks.

## 2021-03-15 DIAGNOSIS — M5442 Lumbago with sciatica, left side: Secondary | ICD-10-CM | POA: Diagnosis not present

## 2021-03-15 DIAGNOSIS — M9901 Segmental and somatic dysfunction of cervical region: Secondary | ICD-10-CM | POA: Diagnosis not present

## 2021-03-15 DIAGNOSIS — M530 Cervicocranial syndrome: Secondary | ICD-10-CM | POA: Diagnosis not present

## 2021-03-15 DIAGNOSIS — M9902 Segmental and somatic dysfunction of thoracic region: Secondary | ICD-10-CM | POA: Diagnosis not present

## 2021-03-15 DIAGNOSIS — M9903 Segmental and somatic dysfunction of lumbar region: Secondary | ICD-10-CM | POA: Diagnosis not present

## 2021-03-15 DIAGNOSIS — M4724 Other spondylosis with radiculopathy, thoracic region: Secondary | ICD-10-CM | POA: Diagnosis not present

## 2021-03-17 ENCOUNTER — Ambulatory Visit: Payer: 59 | Admitting: Neurology

## 2021-03-17 DIAGNOSIS — M530 Cervicocranial syndrome: Secondary | ICD-10-CM | POA: Diagnosis not present

## 2021-03-17 DIAGNOSIS — M9901 Segmental and somatic dysfunction of cervical region: Secondary | ICD-10-CM | POA: Diagnosis not present

## 2021-03-17 DIAGNOSIS — M5442 Lumbago with sciatica, left side: Secondary | ICD-10-CM | POA: Diagnosis not present

## 2021-03-17 DIAGNOSIS — M9902 Segmental and somatic dysfunction of thoracic region: Secondary | ICD-10-CM | POA: Diagnosis not present

## 2021-03-17 DIAGNOSIS — M4724 Other spondylosis with radiculopathy, thoracic region: Secondary | ICD-10-CM | POA: Diagnosis not present

## 2021-03-17 DIAGNOSIS — M9903 Segmental and somatic dysfunction of lumbar region: Secondary | ICD-10-CM | POA: Diagnosis not present

## 2021-03-21 DIAGNOSIS — M4724 Other spondylosis with radiculopathy, thoracic region: Secondary | ICD-10-CM | POA: Diagnosis not present

## 2021-03-21 DIAGNOSIS — M530 Cervicocranial syndrome: Secondary | ICD-10-CM | POA: Diagnosis not present

## 2021-03-21 DIAGNOSIS — M9902 Segmental and somatic dysfunction of thoracic region: Secondary | ICD-10-CM | POA: Diagnosis not present

## 2021-03-21 DIAGNOSIS — M9903 Segmental and somatic dysfunction of lumbar region: Secondary | ICD-10-CM | POA: Diagnosis not present

## 2021-03-21 DIAGNOSIS — M9901 Segmental and somatic dysfunction of cervical region: Secondary | ICD-10-CM | POA: Diagnosis not present

## 2021-03-21 DIAGNOSIS — M5442 Lumbago with sciatica, left side: Secondary | ICD-10-CM | POA: Diagnosis not present

## 2021-03-22 DIAGNOSIS — M9903 Segmental and somatic dysfunction of lumbar region: Secondary | ICD-10-CM | POA: Diagnosis not present

## 2021-03-22 DIAGNOSIS — M9901 Segmental and somatic dysfunction of cervical region: Secondary | ICD-10-CM | POA: Diagnosis not present

## 2021-03-22 DIAGNOSIS — M5442 Lumbago with sciatica, left side: Secondary | ICD-10-CM | POA: Diagnosis not present

## 2021-03-22 DIAGNOSIS — M9902 Segmental and somatic dysfunction of thoracic region: Secondary | ICD-10-CM | POA: Diagnosis not present

## 2021-03-22 DIAGNOSIS — M530 Cervicocranial syndrome: Secondary | ICD-10-CM | POA: Diagnosis not present

## 2021-03-22 DIAGNOSIS — M4724 Other spondylosis with radiculopathy, thoracic region: Secondary | ICD-10-CM | POA: Diagnosis not present

## 2021-03-23 ENCOUNTER — Ambulatory Visit: Payer: 59 | Admitting: Neurology

## 2021-03-23 ENCOUNTER — Encounter: Payer: Self-pay | Admitting: Neurology

## 2021-03-23 ENCOUNTER — Other Ambulatory Visit: Payer: Self-pay

## 2021-03-23 ENCOUNTER — Other Ambulatory Visit: Payer: 59

## 2021-03-23 VITALS — BP 120/80 | HR 88 | Ht 60.0 in | Wt 125.0 lb

## 2021-03-23 DIAGNOSIS — G43719 Chronic migraine without aura, intractable, without status migrainosus: Secondary | ICD-10-CM | POA: Diagnosis not present

## 2021-03-23 DIAGNOSIS — R9082 White matter disease, unspecified: Secondary | ICD-10-CM | POA: Diagnosis not present

## 2021-03-23 LAB — SEDIMENTATION RATE: Sed Rate: 14 mm/hr (ref 0–30)

## 2021-03-23 LAB — C-REACTIVE PROTEIN: CRP: <1 mg/dL (ref 0.5–20.0)

## 2021-03-23 NOTE — Patient Instructions (Addendum)
Schedule for Botox on August 19 Check labs:  SSA/SSB antibodies, pan-ANCA, sed rate and CRP  3.  Agree with sleep study and nerve conduction study

## 2021-03-23 NOTE — Progress Notes (Signed)
NEUROLOGY FOLLOW UP OFFICE NOTE  Chelsey Hunt [id number]  Assessment/Plan:   Chronic migraine without aura, without status migrainosus, intractable Abnormal white matter on brain MRI  Migraine prevention:  She is agreeable to Botox Migraine rescue:  [name] Given the positive ANA and white matter changes on brain, will check labs for vasculitis - SSA/SSB antibodies, pan-ANCA.  As she complains of worsening temporal pain (either side), will check sed rate and CRP to evaluate for giant cell arteritis. Limit use of pain relievers to no more than 2 days out of week to prevent risk of rebound or medication-overuse headache. Keep headache diary Follow up for Botox   Subjective:  Chelsey Hunt is a 55 year old right-handed woman with hypothyroidism who follows up for migraines.   UPDATE: She decided that she didn't want to start Vyepti.  She was agreeable to Botox.  It was scheduled for last Friday but she cancelled and didn't wish to reschedule.  She states that her head has been hurting.  She says it is different.  It feels like a band squeezing around her head and has a crawling sensation on her head and ringing in her ears.  Daily for the last 2 weeks.  Prior to 3 weeks ago, they would occur 3 days a week, lasting 4-5 hours.  Due to fatigue, her PCP checked for autoimmune condition.  ANA was positive with positive cyclic citrullin peptide antibody.  She has been referred to rheumatology.  She also has been referred to sleep medicine.  Current NSAIDS: ASA 81mg  Current analgesics: none Current triptans: None Current ergotamine: None Current abortive anti-CGRP:  none Current anti-emetic: Promethazine Current muscle relaxants: Cyclobenzaprine 10 mg Current anti-anxiolytic: None Current sleep aide: None Current Antihypertensive medications: None Current Antidepressant medications: none Current Anticonvulsant medications: gabapentin 100mg  QHS (for scalp neuralgia; cannot  tolerate higher doses) Current anti-CGRP: Ubrelvy 100mg  Current Vitamins/Herbal/Supplements: Co Q-10; turmeric Current Antihistamines/Decongestants: None Other therapy: Physical therapy for neck pain; dry needling Hormone/birth control: None   Caffeine: No Diet: 5-6 bottles of water daily Exercise: Not routine Depression: No; Anxiety: Yes Other pain:  neck pain.  She has cervical spondylosis Sleep hygiene: Poor   HISTORY: Onset: First migraine was between 38 and 20 years old.  Migraine free until 2004 during her menstrual cycle.  Chronic for many years. Location:  Left more than right. Quality:  throbbing Initial intensity:  Severe.   Aura:  no Prodrome:  no Postdrome:  fatigue Associated symptoms:  Right facial tingling, nausea, photophobia, phonophobia, osmophobia.  She denies associated vomiting, visual disturbance or unilateral numbness or weakness. Initial duration:  3 days Initial Frequency:  daily Initial Frequency of abortive medication: Advil or Tylenol daily, Maxalt 2 days a week Triggers:  Certain smells, change in barometric pressure, sausages, certain cheese, bananas; brazilian nutes, COVID-19 (contracted in September 2020) Relieving factors: Exercise, staying hydrated Activity:  aggravates   During summer 2019, she has had throbbing occipital headache, right-sided neck pain radiating up to the occiput, a burning scalp neuralgia, associated with tinnitus, sometimes left or right sided facial numbness, facial twitching.  She started having a panic attack with palpitations.  She went to the ED on 06/26/18 for symptoms.  EKG showed sinus tachycardia.  She was treated with headache cocktail.  To evaluate worsening headaches, she had an MRI of the brain on 11/21/2018 which demonstrated nonspecific white matter hyperintensities in the cerebral white matter, likely chronic small vessel ischemic changes, but no acute abnormality.  For further evaluation  of neck pain, cervical spine  X-ray on 04/10/2019 showed slight refersal of the normal cervical lordosis with minimal retrolisthesis of C5 on C6 and cervical spondylosis with osteophytes most notable at C4-5 and C5-6.     She has bilateral tinnitus.  Exam by ENT unremarkable.    Past NSAIDS: Ibuprofen, naproxen, Cambia Past analgesics:  Excedrin, Lidocaine nasal Past abortive triptans: Relpax, sumatriptan tablet, Maxalt, Tosymra NS, Zembrace.   Past abortive ergotamine: Migranal Past muscle relaxants:  none Past anti-emetic:  Zofran Past antihypertensive medications: Verapamil, propranolol Past antidepressant medications: Imipramine, nortriptyline Past anticonvulsant medications:  topiramate Past anti-CGRP:  Aimovig (aggravated migraine), Emgality (ineffective), Ajovy, Nurtec Past vitamins/Herbal/Supplements: Riboflavin, magnesium Past antihistamines/decongestants:  None Other past therapies:  acupuncture (triggered migraine), Reyvow   Family history of headache:  Mom (migraines), daughter (migraines)   MRI and MRA of the head from 03/09/2009 revealed nonspecific small subcortical white matter hyperintensities and 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  MRI of the brain on 11/21/2018 which demonstrated nonspecific white matter hyperintensities in the cerebral white matter.  To evaluate new visual aura with migraine (hair floating in her vision), MRI of brain with and without contrast performed on 02/10/2020 again demonstrated non-enhancing white matter T2 and FLAIR signal in the cerebral hemispheres, stable compared to prior imaging from 2020.  To further evaluate abnormal white matter on brain MRI, she had MRI of cervical and thoracic spine without contrast on 03/26/2020 which showed no cord lesions but did show incidental 1 cm T1 and T2 dark ovoid lesion in the T6 vertebral body.  Follow up MRI thoracic spine with contrast on 05/25/2020 showed no change and no enhancement, thought to  represent benign marrow focus .  She was evaluated by ophthalmology in September 2021 which demonstrated dry eye but no concerning findings such as optic neuritis.  PAST MEDICAL HISTORY: Past Medical History:  Diagnosis Date   ACID REFLUX DISEASE 01/03/2008   Anemia    ANEMIA, IRON DEFICIENCY 12/23/2008   history   Anxiety    Chronic tension type headache 01/13/2009   migraines   Complication of anesthesia    pt states she just needs a small amount or she will sleep too long   Dysplasia of cervix, low grade (CIN 1)    Fibroid    GERD (gastroesophageal reflux disease)    HEART MURMUR, SYSTOLIC 78/29/5621   will have echo 06/18/2013   HYPOTHYROIDISM, POST-RADIATION 08/12/2009   Irritable bowel syndrome 12/23/2008   Migraine    Ovarian cyst    Ovarian cyst    THYROID NODULE, RIGHT 04/21/2009    MEDICATIONS: Current Outpatient Medications on File Prior to Visit  Medication Sig Dispense Refill   acetaminophen (TYLENOL) 325 MG tablet Take 650 mg by mouth every 6 (six) hours as needed for mild pain.     aspirin EC 81 MG tablet Take 81 mg by mouth daily. Swallow whole.     co-enzyme Q-10 30 MG capsule Take 200 mg by mouth 3 (three) times daily.     cyclobenzaprine (FLEXERIL) 5 MG tablet Take 1 tablet (5 mg total) by mouth 3 (three) times daily as needed for muscle spasms. 30 tablet 1   dicyclomine (BENTYL) 10 MG capsule Take by mouth.     ibuprofen (ADVIL,MOTRIN) 200 MG tablet Take 200 mg by mouth every 8 (eight) hours as needed.      omeprazole (PRILOSEC) 40 MG capsule Take 1 capsule (40 mg total) by mouth 2 (two)  times daily. 180 capsule 1   promethazine (PHENERGAN) 25 MG tablet Take 1 tablet (25 mg total) by mouth every 6 (six) hours as needed for nausea or vomiting. 30 tablet 5   rosuvastatin (CRESTOR) 10 MG tablet Take 1 tablet (10 mg total) by mouth daily. 90 tablet 3   SYNTHROID 75 MCG tablet TAKE 1 TABLET (75 MCG TOTAL) BY MOUTH DAILY. 90 tablet 3   valACYclovir (VALTREX) 1000 MG  tablet TAKE 1 TABLET EVERY 12 HOURS BY MOUTH FOR 5 DAYS 30 tablet 11   No current facility-administered medications on file prior to visit.    ALLERGIES: Allergies  Allergen Reactions   Imitrex [Sumatriptan] Palpitations   Reglan [Metoclopramide] Palpitations   Propoxyphene N-Acetaminophen    Nabumetone Rash    REACTION: GI upset   Topiramate Rash    REACTION: rash    FAMILY HISTORY: Family History  Problem Relation Age of Onset   Goiter Mother    Heart disease Mother    Thyroid disease Mother    Heart attack Mother    Berenice Primas' disease Sister    Asthma Sister    Cancer Father    Heart disease Father    Heart attack Father    CVA Brother    Hypertension Brother    Hypertension Brother    Alcoholism Brother    Stomach cancer Sister    Hepatitis Sister    Stomach cancer Sister    Goiter Other        Siblings 2   Hypertension Paternal Grandfather    Depression Neg Hx    Alcohol abuse Neg Hx    Drug abuse Neg Hx    Early death Neg Hx    Hearing loss Neg Hx    Kidney disease Neg Hx    Stroke Neg Hx       Objective:  Blood pressure 120/80, pulse 88, height 5' (1.524 m), weight 125 lb (56.7 kg), last menstrual period 04/20/2013, SpO2 98 %. General: No acute distress.  Patient appears well-groomed.   Head:  Normocephalic/atraumatic Eyes:  Fundi examined but not visualized Neck: supple, no paraspinal tenderness, full range of motion Heart:  Regular rate and rhythm Lungs:  Clear to auscultation bilaterally Back: No paraspinal tenderness Neurological Exam: alert and oriented to person, place, and time.  Speech fluent and not dysarthric, language intact.  CN II-XII intact. Bulk and tone normal, muscle strength 5/5 throughout.  Sensation to light touch intact.  Deep tendon reflexes 2+ throughout, toes downgoing.  Finger to nose testing intact.  Gait normal, Romberg negative.   Metta Clines, DO

## 2021-03-24 LAB — PAN-ANCA
ANCA Screen: NEGATIVE
Myeloperoxidase Abs: <1 AI
Serine Protease 3: <1 AI

## 2021-03-24 LAB — SJOGREN'S SYNDROME ANTIBODS(SSA + SSB)
SSA (Ro) (ENA) Antibody, IgG: <1 AI
SSB (La) (ENA) Antibody, IgG: <1 AI

## 2021-03-27 DIAGNOSIS — M530 Cervicocranial syndrome: Secondary | ICD-10-CM | POA: Diagnosis not present

## 2021-03-27 DIAGNOSIS — M5442 Lumbago with sciatica, left side: Secondary | ICD-10-CM | POA: Diagnosis not present

## 2021-03-27 DIAGNOSIS — M9903 Segmental and somatic dysfunction of lumbar region: Secondary | ICD-10-CM | POA: Diagnosis not present

## 2021-03-27 DIAGNOSIS — M9902 Segmental and somatic dysfunction of thoracic region: Secondary | ICD-10-CM | POA: Diagnosis not present

## 2021-03-27 DIAGNOSIS — M9901 Segmental and somatic dysfunction of cervical region: Secondary | ICD-10-CM | POA: Diagnosis not present

## 2021-03-27 DIAGNOSIS — M4724 Other spondylosis with radiculopathy, thoracic region: Secondary | ICD-10-CM | POA: Diagnosis not present

## 2021-03-28 ENCOUNTER — Other Ambulatory Visit (HOSPITAL_COMMUNITY): Payer: Self-pay

## 2021-03-28 ENCOUNTER — Other Ambulatory Visit: Payer: Self-pay | Admitting: Neurology

## 2021-03-28 DIAGNOSIS — T148XXA Other injury of unspecified body region, initial encounter: Secondary | ICD-10-CM

## 2021-03-28 MED ORDER — PROMETHAZINE HCL 25 MG PO TABS
25.0000 mg | ORAL_TABLET | Freq: Four times a day (QID) | ORAL | 5 refills | Status: DC | PRN
  Filled 2021-03-28: qty 30, 8d supply, fill #0

## 2021-03-28 MED ORDER — CYCLOBENZAPRINE HCL 5 MG PO TABS
5.0000 mg | ORAL_TABLET | Freq: Three times a day (TID) | ORAL | 5 refills | Status: DC | PRN
  Filled 2021-03-28: qty 30, 10d supply, fill #0

## 2021-03-29 ENCOUNTER — Other Ambulatory Visit (HOSPITAL_COMMUNITY): Payer: Self-pay

## 2021-03-29 ENCOUNTER — Encounter: Payer: Self-pay | Admitting: Emergency Medicine

## 2021-03-29 DIAGNOSIS — M9903 Segmental and somatic dysfunction of lumbar region: Secondary | ICD-10-CM | POA: Diagnosis not present

## 2021-03-29 DIAGNOSIS — M530 Cervicocranial syndrome: Secondary | ICD-10-CM | POA: Diagnosis not present

## 2021-03-29 DIAGNOSIS — M4724 Other spondylosis with radiculopathy, thoracic region: Secondary | ICD-10-CM | POA: Diagnosis not present

## 2021-03-29 DIAGNOSIS — M9901 Segmental and somatic dysfunction of cervical region: Secondary | ICD-10-CM | POA: Diagnosis not present

## 2021-03-29 DIAGNOSIS — M5442 Lumbago with sciatica, left side: Secondary | ICD-10-CM | POA: Diagnosis not present

## 2021-03-29 DIAGNOSIS — M9902 Segmental and somatic dysfunction of thoracic region: Secondary | ICD-10-CM | POA: Diagnosis not present

## 2021-03-29 NOTE — Telephone Encounter (Signed)
Please refer to nutritional services.  Thanks.

## 2021-03-30 ENCOUNTER — Other Ambulatory Visit (HOSPITAL_COMMUNITY): Payer: Self-pay

## 2021-03-30 MED FILL — Valacyclovir HCl Tab 1 GM: ORAL | 15 days supply | Qty: 30 | Fill #0 | Status: AC

## 2021-03-31 DIAGNOSIS — M9901 Segmental and somatic dysfunction of cervical region: Secondary | ICD-10-CM | POA: Diagnosis not present

## 2021-03-31 DIAGNOSIS — G8929 Other chronic pain: Secondary | ICD-10-CM | POA: Diagnosis not present

## 2021-03-31 DIAGNOSIS — M542 Cervicalgia: Secondary | ICD-10-CM | POA: Diagnosis not present

## 2021-03-31 DIAGNOSIS — M530 Cervicocranial syndrome: Secondary | ICD-10-CM | POA: Diagnosis not present

## 2021-03-31 DIAGNOSIS — M4724 Other spondylosis with radiculopathy, thoracic region: Secondary | ICD-10-CM | POA: Diagnosis not present

## 2021-03-31 DIAGNOSIS — G894 Chronic pain syndrome: Secondary | ICD-10-CM | POA: Diagnosis not present

## 2021-03-31 DIAGNOSIS — Z79891 Long term (current) use of opiate analgesic: Secondary | ICD-10-CM | POA: Diagnosis not present

## 2021-03-31 DIAGNOSIS — M5442 Lumbago with sciatica, left side: Secondary | ICD-10-CM | POA: Diagnosis not present

## 2021-03-31 DIAGNOSIS — M9903 Segmental and somatic dysfunction of lumbar region: Secondary | ICD-10-CM | POA: Diagnosis not present

## 2021-03-31 DIAGNOSIS — M9902 Segmental and somatic dysfunction of thoracic region: Secondary | ICD-10-CM | POA: Diagnosis not present

## 2021-03-31 DIAGNOSIS — F1120 Opioid dependence, uncomplicated: Secondary | ICD-10-CM | POA: Diagnosis not present

## 2021-04-03 ENCOUNTER — Other Ambulatory Visit (HOSPITAL_COMMUNITY): Payer: Self-pay

## 2021-04-03 MED ORDER — DULOXETINE HCL 20 MG PO CPEP
20.0000 mg | ORAL_CAPSULE | Freq: Every day | ORAL | 0 refills | Status: DC
  Filled 2021-04-03: qty 30, 30d supply, fill #0

## 2021-04-04 DIAGNOSIS — M4724 Other spondylosis with radiculopathy, thoracic region: Secondary | ICD-10-CM | POA: Diagnosis not present

## 2021-04-04 DIAGNOSIS — M9902 Segmental and somatic dysfunction of thoracic region: Secondary | ICD-10-CM | POA: Diagnosis not present

## 2021-04-04 DIAGNOSIS — M9901 Segmental and somatic dysfunction of cervical region: Secondary | ICD-10-CM | POA: Diagnosis not present

## 2021-04-04 DIAGNOSIS — M5442 Lumbago with sciatica, left side: Secondary | ICD-10-CM | POA: Diagnosis not present

## 2021-04-04 DIAGNOSIS — M9903 Segmental and somatic dysfunction of lumbar region: Secondary | ICD-10-CM | POA: Diagnosis not present

## 2021-04-04 DIAGNOSIS — M530 Cervicocranial syndrome: Secondary | ICD-10-CM | POA: Diagnosis not present

## 2021-04-11 ENCOUNTER — Other Ambulatory Visit (HOSPITAL_COMMUNITY): Payer: Self-pay

## 2021-04-13 DIAGNOSIS — Z0279 Encounter for issue of other medical certificate: Secondary | ICD-10-CM

## 2021-04-17 ENCOUNTER — Other Ambulatory Visit: Payer: Self-pay

## 2021-04-17 DIAGNOSIS — R202 Paresthesia of skin: Secondary | ICD-10-CM

## 2021-04-18 DIAGNOSIS — D2261 Melanocytic nevi of right upper limb, including shoulder: Secondary | ICD-10-CM | POA: Diagnosis not present

## 2021-04-18 DIAGNOSIS — D2371 Other benign neoplasm of skin of right lower limb, including hip: Secondary | ICD-10-CM | POA: Diagnosis not present

## 2021-04-18 DIAGNOSIS — D485 Neoplasm of uncertain behavior of skin: Secondary | ICD-10-CM | POA: Diagnosis not present

## 2021-04-18 DIAGNOSIS — L820 Inflamed seborrheic keratosis: Secondary | ICD-10-CM | POA: Diagnosis not present

## 2021-04-19 ENCOUNTER — Other Ambulatory Visit: Payer: Self-pay | Admitting: Family

## 2021-04-19 ENCOUNTER — Other Ambulatory Visit: Payer: Self-pay

## 2021-04-19 ENCOUNTER — Ambulatory Visit: Payer: 59 | Admitting: Neurology

## 2021-04-19 ENCOUNTER — Other Ambulatory Visit (HOSPITAL_COMMUNITY): Payer: Self-pay

## 2021-04-19 DIAGNOSIS — R202 Paresthesia of skin: Secondary | ICD-10-CM

## 2021-04-19 DIAGNOSIS — G5623 Lesion of ulnar nerve, bilateral upper limbs: Secondary | ICD-10-CM

## 2021-04-19 DIAGNOSIS — E039 Hypothyroidism, unspecified: Secondary | ICD-10-CM

## 2021-04-19 MED ORDER — SYNTHROID 75 MCG PO TABS
75.0000 ug | ORAL_TABLET | Freq: Every day | ORAL | 3 refills | Status: DC
  Filled 2021-04-19: qty 90, 90d supply, fill #0

## 2021-04-19 NOTE — Progress Notes (Signed)
Office Visit Note  Patient: Chelsey Hunt             Date of Birth: [birth year]           MRN: [id number]             PCP: Horald Pollen, MD Referring: Horald Pollen, * Visit Date: 04/20/2021 Occupation: CNA  Subjective:   History of Present Illness: Chelsey Hunt is a 55 y.o. female here for positive ANA and CCP antibody titers with multiple symptoms including fatigue, migraines, dry eyes. Neurology follow up and evaluations for chronic migraines including neuroimaging with diffuse white matter enhancements.  For joint involvement she has mild arthritic changes in the right hand especially deviation of the fifth finger though she states this has been longstanding and stable for years.  There has been some increased enlargement of the PIP joints she has pain mostly associated with use.  She feels chronically fatigued though attributes this in part to menopausal changes and also working third shift.  Chronic migraine headaches have been extensive and follows with neurology for this with repeated neuroimaging over time mostly nonspecific with diffuse white matter changes.  Ophthalmology exam has shown dry eyes with no inflammatory concerns reported.  She was prescribed eyedrops for this but did not like them currently she uses Visine as needed does not have recurrent problem with abrasion or infections.  She denies dry mouth or new dental issues.  She reports possible frontotemporal hair thinning but no rashes or severe bald patches.  She has had a few episodes of painful pallor discoloration in the fingertips with cold exposure that resolved fully with rewarming.  She denies lymphadenopathy, pleurisy, photosensitive rashes, or history of blood clots. She has a strong family history of lupus with her sister and mother no personal previous diagnosis of an autoimmune condition.  Labs reviewed 03/2021 ANCA neg ESR 14 CRP neg  02/2021 ANA 1:320 speckled SSA, SSB neg RF  neg CCP 55 ESR 40   Activities of Daily Living:  Patient reports morning stiffness for a few minutes.   Patient Denies nocturnal pain.  Difficulty dressing/grooming: Denies Difficulty climbing stairs: Denies Difficulty getting out of chair: Denies Difficulty using hands for taps, buttons, cutlery, and/or writing: Denies  Review of Systems  Constitutional:  Positive for fatigue.  HENT:  Positive for mouth dryness and nose dryness. Negative for mouth sores.   Eyes:  Positive for pain and dryness. Negative for itching.  Respiratory:  Negative for shortness of breath and difficulty breathing.   Cardiovascular:  Positive for palpitations. Negative for chest pain.  Gastrointestinal:  Positive for constipation. Negative for blood in stool and diarrhea.  Endocrine: Negative for increased urination.  Genitourinary:  Negative for difficulty urinating.  Musculoskeletal:  Positive for joint pain, joint pain and morning stiffness. Negative for joint swelling, myalgias, muscle tenderness and myalgias.  Skin:  Positive for color change. Negative for rash and redness.  Allergic/Immunologic: Negative for susceptible to infections.  Neurological:  Positive for dizziness, numbness and headaches. Negative for memory loss and weakness.  Hematological:  Positive for bruising/bleeding tendency.  Psychiatric/Behavioral:  Positive for confusion.    PMFS History:  Patient Active Problem List   Diagnosis Date Noted   Positive ANA (antinuclear antibody) 04/20/2021   Finger deformity 04/20/2021   Raynaud phenomenon 09/17/2019   Hand cramps 09/17/2019   Carpal tunnel syndrome on left 09/17/2019   Anxiety 07/30/2019   Rash and nonspecific skin eruption 07/01/2019   Musculoskeletal strain  03/25/2019   Visit for screening mammogram 03/04/2019   Chronic idiopathic constipation 01/08/2019   Essential hypertension 10/24/2018   Psychophysiological insomnia 07/07/2018   Seasonal allergic rhinitis due to pollen  08/08/2017   Intractable migraine without aura and without status migrainosus 07/02/2016   Hyperglycemia 11/28/2015   Pancreas divisum of native pancreas 12/27/2014   Routine general medical examination at a health care facility 07/30/2012   GERD (gastroesophageal reflux disease)    Hypothyroidism 08/12/2009    Past Medical History:  Diagnosis Date   ACID REFLUX DISEASE 01/03/2008   Anemia    ANEMIA, IRON DEFICIENCY 12/23/2008   history   Anxiety    Chronic tension type headache 01/13/2009   migraines   Complication of anesthesia    pt states she just needs a small amount or she will sleep too long   Dysplasia of cervix, low grade (CIN 1)    Fibroid    GERD (gastroesophageal reflux disease)    HEART MURMUR, SYSTOLIC 30/86/5784   will have echo 06/18/2013   HYPOTHYROIDISM, POST-RADIATION 08/12/2009   Irritable bowel syndrome 12/23/2008   Migraine    Ovarian cyst    Ovarian cyst    THYROID NODULE, RIGHT 04/21/2009    Family History  Problem Relation Age of Onset   Heart disease Mother    Thyroid disease Mother    Heart attack Mother    Cancer Father    Heart disease Father    Heart attack Father    Throat cancer Father    Lung cancer Father    Alcoholism Father    Graves' disease Sister    Asthma Sister    Hepatitis Sister    Stomach cancer Sister    CVA Brother    Alzheimer's disease Brother    Goiter Brother    Hypertension Brother    Hypertension Brother    Alcoholism Brother    Hypertension Paternal Grandfather    Healthy Daughter    Goiter Other        Siblings 2   GER disease Son    GER disease Son    Depression Neg Hx    Alcohol abuse Neg Hx    Drug abuse Neg Hx    Early death Neg Hx    Hearing loss Neg Hx    Kidney disease Neg Hx    Stroke Neg Hx    Past Surgical History:  Procedure Laterality Date   ABDOMINAL HYSTERECTOMY     COLONOSCOPY     HYSTEROSCOPY     LAPAROSCOPY     NOVASURE ABLATION     POLYPECTOMY     TUBAL LIGATION     UPPER GI  ENDOSCOPY     VAGINAL HYSTERECTOMY N/A 06/24/2013   Procedure: TOTAL HYSTERECTOMY VAGINAL;  Surgeon: Alwyn Pea, MD;  Location: [facility];  Service: Gynecology;  Laterality: N/A;   Social History   Social History Narrative   Regular exercise-yes      Patient is right-handed.She lives with her husband in a 1 story home. She has recently been avoiding caffeine. Walks.   Immunization History  Administered Date(s) Administered   Hepatitis A, Adult 02/17/2014   Influenza Whole 06/19/2012, 06/15/2013   Influenza,inj,Quad PF,6+ Mos 06/09/2016, 06/10/2017, 06/13/2018, 07/07/2019   Influenza-Unspecified 05/12/2015   Td 12/29/2009   Tdap 03/07/2020     Objective: Vital Signs: BP 135/80 (BP Location: Right Arm, Patient Position: Sitting, Cuff Size: Normal)   Pulse 81   Ht 5' 0.5" (1.537 m)  Wt 126 lb 9.6 oz (57.4 kg)   LMP 04/20/2013   BMI 24.32 kg/m    Physical Exam HENT:     Right Ear: External ear normal.     Left Ear: External ear normal.     Mouth/Throat:     Mouth: Mucous membranes are moist.     Pharynx: Oropharynx is clear.  Eyes:     Conjunctiva/sclera: Conjunctivae normal.  Cardiovascular:     Rate and Rhythm: Normal rate and regular rhythm.     Comments: Systolic murmur loudest over left upper sternal border Pulmonary:     Effort: Pulmonary effort is normal.     Breath sounds: Normal breath sounds.  Skin:    General: Skin is warm and dry.     Findings: No rash.     Comments: Normal appearance of nailfold capillaries  Neurological:     General: No focal deficit present.     Mental Status: She is alert.     Deep Tendon Reflexes: Reflexes normal.  Psychiatric:        Mood and Affect: Mood normal.     Musculoskeletal Exam:  Shoulders full ROM no tenderness or swelling Elbows full ROM no tenderness or swelling Wrists full ROM no tenderness or swelling Fingers right fifth PIP laterally deviated with bony enlargement, 2nd-4th PIP joints with slightly decreased  extension range of motion chronic joint enlargement without obviously palpable synovitis and no tenderness, left hand fingers appear normal Knees full ROM no tenderness or swelling Ankles full ROM no tenderness or swelling  Investigation: No additional findings.  Imaging: NCV with EMG(electromyography)  Result Date: 04/19/2021 Alda Berthold, DO     04/19/2021 11:33 AM [facility] [place], [place]  [place],  [address] Tel: [phone] Fax:  [phone] Test Date:  04/19/2021 Patient: Chelsey Hunt DOB: 1966/05/13 Physician: Narda Amber, DO Sex: Female Height: 5' " Ref Phys: Ashley Akin, NP ID#: [id number]   Technician:  Patient Complaints: This is a 55 year old female referred for evaluation of bilateral hand paresthesias and pain. NCV & EMG Findings: Extensive electrodiagnostic testing of the right upper extremity and additional studies of the left shows: Bilateral median, mixed palmar, and right ulnar sensory responses are within normal limits.  Left ulnar sensory response is mildly prolonged. Bilateral median motor responses are within normal limits.  Bilateral ulnar motor responses show slowed conduction velocity across the elbow (A Elbow-B Elbow, L30, R38 m/s).  There is no evidence of active or chronic motor axonal loss changes affecting any of the tested muscles.  Motor unit configuration and recruitment pattern is within normal limits.  Impression: Bilateral ulnar neuropathy with slowing across the elbow, purely demyelinating, moderate. There is no evidence of carpal tunnel syndrome or cervical radiculopathy affecting the upper extremities ___________________________ Narda Amber, DO Nerve Conduction Studies Anti Sensory Summary Table  Stim Site NR Peak (ms) Norm Peak (ms) P-T Amp (V) Norm P-T Amp Left Median Anti Sensory (2nd Digit)  32C Wrist    3.3 <3.6 58.2 >15 Right Median Anti Sensory (2nd Digit)  32C Wrist    3.2 <3.6 58.3 >15 Left Ulnar Anti Sensory  (5th Digit)  32C Wrist    3.3 <3.1 29.2 >10 Right Ulnar Anti Sensory (5th Digit)  32C Wrist    2.7 <3.1 30.2 >10 Motor Summary Table  Stim Site NR Onset (ms) Norm Onset (ms) O-P Amp (mV) Norm O-P Amp Site1 Site2 Delta-0 (ms) Dist (cm) Vel (m/s) Norm Vel (m/s) Left Median  Motor (Abd Poll Brev)  32C Wrist    3.4 <4.0 14.1 >6 Elbow Wrist 4.6 27.0 59 >50 Elbow    8.0  13.8        Right Median Motor (Abd Poll Brev)  32C Wrist    3.0 <4.0 14.5 >6 Elbow Wrist 4.4 27.0 61 >50 Elbow    7.4  13.7        Left Ulnar Motor (Abd Dig Minimi)  32C Wrist    3.0 <3.1 10.5 >7 B Elbow Wrist 4.3 22.0 51 >50 B Elbow    7.3  9.2  A Elbow B Elbow 3.3 10.0 30 >50 A Elbow    10.6  8.4        Right Ulnar Motor (Abd Dig Minimi)  32C Wrist    2.4 <3.1 12.4 >7 B Elbow Wrist 3.8 22.0 58 >50 B Elbow    6.2  11.7  A Elbow B Elbow 2.6 10.0 38 >50 A Elbow    8.8  10.7        Comparison Summary Table  Stim Site NR Peak (ms) Norm Peak (ms) P-T Amp (V) Site1 Site2 Delta-P (ms) Norm Delta (ms) Left Median/Ulnar Palm Comparison (Wrist - 8cm)  32C Median Palm    1.9 <2.2 71.0 Median Palm Ulnar Palm 0.1  Ulnar Palm    1.8 <2.2 25.9     Right Median/Ulnar Palm Comparison (Wrist - 8cm)  32C Median Palm    2.0 <2.2 66.8 Median Palm Ulnar Palm 0.3  Ulnar Palm    1.7 <2.2 8.7     EMG  Side Muscle Ins Act Fibs Psw Fasc Number Recrt Dur Dur. Amp Amp. Poly Poly. Comment Right 1stDorInt _0  _1  Nml Nml N/A Right PronatorTeres _2  _3  Nml Nml N/A Right Biceps _4  _5  Nml Nml N/A Right Triceps _6  _7  Nml Nml N/A Right Deltoid _8  _9  Nml Nml N/A Right FlexCarpiUln _10  _11  Nml Nml N/A Left 1stDorInt _12  _13  Nml Nml N/A Left PronatorTeres _14  _15  Nml Nml N/A Left Biceps _16  _17   Nml Nml N/A Left Triceps _18  _19  Nml Nml N/A Left Deltoid _20  _21  Nml Nml N/A Left FlexCarpiUln _22  _23  Nml Nml N/A Waveforms:             XR Hand 2 View Left  Result Date: 04/20/2021 X-ray left hand 2 views Radiocarpal and carpal joint spaces are intact multiple cystic changes of the carpal bones.  MCP PIP and DIP joint spaces are all well preserved.  No significant osteophytosis or joint erosions seen.  Bone mineralization appears normal. Impression No significant arthritis changes are seen  XR Hand 2 View Right  Result Date: 04/20/2021 X-ray right hand 2 views Radiocarpal and carpal joint spaces are preserved several cystic changes in the carpal bones.  MCP joint spaces are well-preserved.  Prominent osteophytes at first IP joint.  Extensive degenerative change in the fifth PIP with more advanced stenosis and deviation compared  to other digits.  PIP and DIP joint spaces appear well-preserved.  No joint erosions or focal demineralization seen. 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  Procedures:  No procedures performed Allergies: Imitrex [sumatriptan], Reglan [metoclopramide], Propoxyphene n-acetaminophen, Nabumetone, and Topiramate   Assessment / Plan:     Visit Diagnoses: Positive ANA (antinuclear antibody) - Plan:  XR Hand 2 View Right, XR Hand 2 View Left, RNP Antibody, Anti-Smith antibody, Anti-DNA antibody, double-stranded, C3 and C4, Urinalysis, Routine w reflex microscopic  Positive ANA with Raynaud's, right PIP joint changes, otherwise chronic nonspecific symptoms.  She has a very strong family history and moderately high positive titers.  With positive anti-CCP antibodies could also be an overlap syndrome.  Checking more specific serologies at this time also urinalysis for screening renal involvement.  If labs are normal and no erosive disease on imaging may not require additional treatment at this time but with plan to follow-up.  Hand cramps Deviation of finger of right hand - Plan: XR Hand 2 View Right, XR Hand 2 View Left  Mild deformities of right hand may represent osteoarthritis changes though cannot exclude chronic synovial changes in the right PIPs especially.  We will check bilateral hand x-rays.  Raynaud's phenomenon without gangrene  Description of cold induced pallor of the fingertips consistent with Raynaud's symptoms no complications and no abnormal exam findings at this time  Orders: Orders Placed This Encounter  Procedures   XR Hand 2 View Right   XR Hand 2 View Left   RNP Antibody   Anti-Smith antibody   Anti-DNA antibody, double-stranded   C3 and C4   Urinalysis, Routine w reflex microscopic    No orders of the defined types were placed in this encounter.   Follow-Up Instructions: No follow-ups on file.   Collier Salina, MD  Note - This record has been created using [employer].  Chart creation errors have been sought, but may not always  have been located. Such creation errors do not reflect on  the standard of medical care.

## 2021-04-19 NOTE — Procedures (Signed)
[facility]  [place], [address]  [place], [place] [address] Tel: [phone] Fax:  [phone] Test Date:  04/19/2021  Patient: Chelsey Hunt DOB: 1965/09/20 Physician: Narda Amber, DO  Sex: Female Height: 5' " Ref Phys: Ashley Akin, NP  ID#: [id number]   Technician:    Patient Complaints: This is a 55 year old female referred for evaluation of bilateral hand paresthesias and pain.  NCV & EMG Findings: Extensive electrodiagnostic testing of the right upper extremity and additional studies of the left shows:  Bilateral median, mixed palmar, and right ulnar sensory responses are within normal limits.  Left ulnar sensory response is mildly prolonged. Bilateral median motor responses are within normal limits.  Bilateral ulnar motor responses show slowed conduction velocity across the elbow (A Elbow-B Elbow, L30, R38 m/s).   There is no evidence of active or chronic motor axonal loss changes affecting any of the tested muscles.  Motor unit configuration and recruitment pattern is within normal limits.    Impression: Bilateral ulnar neuropathy with slowing across the elbow, purely demyelinating, moderate. There is no evidence of carpal tunnel syndrome or cervical radiculopathy affecting the upper extremities   ___________________________ Narda Amber, DO    Nerve Conduction Studies Anti Sensory Summary Table   Stim Site NR Peak (ms) Norm Peak (ms) P-T Amp (V) Norm P-T Amp  Left Median Anti Sensory (2nd Digit)  32C  Wrist    3.3 <3.6 58.2 >15  Right Median Anti Sensory (2nd Digit)  32C  Wrist    3.2 <3.6 58.3 >15  Left Ulnar Anti Sensory (5th Digit)  32C  Wrist    3.3 <3.1 29.2 >10  Right Ulnar Anti Sensory (5th Digit)  32C  Wrist    2.7 <3.1 30.2 >10   Motor Summary Table   Stim Site NR Onset (ms) Norm Onset (ms) O-P Amp (mV) Norm O-P Amp Site1 Site2 Delta-0 (ms) Dist (cm) Vel (m/s) Norm Vel (m/s)  Left Median Motor (Abd Poll Brev)  32C   Wrist    3.4 <4.0 14.1 >6 Elbow Wrist 4.6 27.0 59 >50  Elbow    8.0  13.8         Right Median Motor (Abd Poll Brev)  32C  Wrist    3.0 <4.0 14.5 >6 Elbow Wrist 4.4 27.0 61 >50  Elbow    7.4  13.7         Left Ulnar Motor (Abd Dig Minimi)  32C  Wrist    3.0 <3.1 10.5 >7 B Elbow Wrist 4.3 22.0 51 >50  B Elbow    7.3  9.2  A Elbow B Elbow 3.3 10.0 30 >50  A Elbow    10.6  8.4         Right Ulnar Motor (Abd Dig Minimi)  32C  Wrist    2.4 <3.1 12.4 >7 B Elbow Wrist 3.8 22.0 58 >50  B Elbow    6.2  11.7  A Elbow B Elbow 2.6 10.0 38 >50  A Elbow    8.8  10.7          Comparison Summary Table   Stim Site NR Peak (ms) Norm Peak (ms) P-T Amp (V) Site1 Site2 Delta-P (ms) Norm Delta (ms)  Left Median/Ulnar Palm Comparison (Wrist - 8cm)  32C  Median Palm    1.9 <2.2 71.0 Median Palm Ulnar Palm 0.1   Ulnar Palm    1.8 <2.2 25.9      Right Median/Ulnar Palm Comparison (  Wrist - 8cm)  32C  Median Palm    2.0 <2.2 66.8 Median Palm Ulnar Palm 0.3   Ulnar Palm    1.7 <2.2 8.7       EMG   Side Muscle Ins Act Fibs Psw Fasc Number Recrt Dur Dur. Amp Amp. Poly Poly. Comment  Right 1stDorInt Nml Nml Nml Nml Nml Nml Nml Nml Nml Nml Nml Nml N/A  Right PronatorTeres Nml Nml Nml Nml Nml Nml Nml Nml Nml Nml Nml Nml N/A  Right Biceps Nml Nml Nml Nml Nml Nml Nml Nml Nml Nml Nml Nml N/A  Right Triceps Nml Nml Nml Nml Nml Nml Nml Nml Nml Nml Nml Nml N/A  Right Deltoid Nml Nml Nml Nml Nml Nml Nml Nml Nml Nml Nml Nml N/A  Right FlexCarpiUln Nml Nml Nml Nml Nml Nml Nml Nml Nml Nml Nml Nml N/A  Left 1stDorInt Nml Nml Nml Nml Nml Nml Nml Nml Nml Nml Nml Nml N/A  Left PronatorTeres Nml Nml Nml Nml Nml Nml Nml Nml Nml Nml Nml Nml N/A  Left Biceps Nml Nml Nml Nml Nml Nml Nml Nml Nml Nml Nml Nml N/A  Left Triceps Nml Nml Nml Nml Nml Nml Nml Nml Nml Nml Nml Nml N/A  Left Deltoid Nml Nml Nml Nml Nml Nml Nml Nml Nml Nml Nml Nml N/A  Left FlexCarpiUln Nml Nml Nml Nml Nml Nml Nml Nml Nml Nml Nml Nml N/A       Waveforms:

## 2021-04-20 ENCOUNTER — Ambulatory Visit: Payer: Self-pay

## 2021-04-20 ENCOUNTER — Encounter: Payer: Self-pay | Admitting: Internal Medicine

## 2021-04-20 ENCOUNTER — Ambulatory Visit: Payer: 59 | Admitting: Internal Medicine

## 2021-04-20 VITALS — BP 135/80 | HR 81 | Ht 60.5 in | Wt 126.6 lb

## 2021-04-20 DIAGNOSIS — I7300 Raynaud's syndrome without gangrene: Secondary | ICD-10-CM | POA: Diagnosis not present

## 2021-04-20 DIAGNOSIS — M20001 Unspecified deformity of right finger(s): Secondary | ICD-10-CM | POA: Diagnosis not present

## 2021-04-20 DIAGNOSIS — R768 Other specified abnormal immunological findings in serum: Secondary | ICD-10-CM | POA: Diagnosis not present

## 2021-04-20 DIAGNOSIS — M79642 Pain in left hand: Secondary | ICD-10-CM

## 2021-04-20 DIAGNOSIS — R252 Cramp and spasm: Secondary | ICD-10-CM | POA: Diagnosis not present

## 2021-04-20 DIAGNOSIS — M20009 Unspecified deformity of unspecified finger(s): Secondary | ICD-10-CM | POA: Insufficient documentation

## 2021-04-20 DIAGNOSIS — M79641 Pain in right hand: Secondary | ICD-10-CM | POA: Diagnosis not present

## 2021-04-20 NOTE — Patient Instructions (Addendum)
Anti-DNA Antibody Test Why am I having this test? The anti-DNA antibody test helps with the diagnosis and follow-up of systemic lupus erythematosus (SLE). It is also used to monitor treatment of thiscondition as the antibody decreases with successful therapy. What is being tested? This test measures the amount of anti-DNA antibody in the blood. This antibody is found in 65-80% of patients with active SLE. This antibody is not as commonin patients who have other diseases. What kind of sample is taken?  A blood sample is required for this test. It is usually collected by insertinga needle into a blood vessel. How are the results reported? Your test results will be reported as a value. Your test results may also be reported as positive, intermediate, or negative. Your health care provider will compare your results to normal ranges that were established after testing a large group of people (reference values). Reference values may vary among labs and hospitals. For this test, common reference values are: Positive: 10 or more international units/mL. Intermediate: 5-9 international units/mL. Negative: Less than 5 international units/mL. What do the results mean? Positive results, which are associated with results that are higher than the reference values, may indicate: Autoimmune disorders such as SLE. Infectious mononucleosis. Chronic liver conditions. Intermediate results mean that the anti-DNA antibody levels are higher thannormal, but not high enough to be considered positive. Negative results mean that you do not have the anti-DNA antibody that isassociated with these conditions. Talk with your health care provider about what your results mean. Questions to ask your health care provider Ask your health care provider, or the department that is doing the test: When will my results be ready? How will I get my results? What are my treatment options? What other tests do I need? What are my next  steps? Summary The anti-DNA antibody test helps with the diagnosis and follow-up of systemic lupus erythematosus (SLE). It is also used to monitor treatment of this condition as the antibody decreases with successful therapy. This test measures the amount of anti-DNA antibody in the blood. Elevated levels of anti-DNA antibody can be seen in patients with SLE and certain other conditions.  Complement Assay Test Why am I having this test? Complement refers to a group of proteins that are part of the body's disease-fighting system (immune system). A complement assay test provides information about some or all of these proteins. You may have this test: To diagnose a lack, or deficiency, of certain complement proteins. Deficiencies can be passed from parent to child (inherited). To monitor an infection or autoimmune disease. If you have unexplained inflammation or swelling (edema). If you have bacterial infections again and again. What is being tested? This test can be used to measure: Total complement. This is the total number of protein complements in your blood. The number of each kind of complement in your blood. The nine main kinds of complement are labeled C1 through C9. Some of these complements, such as C3 and C4, are especially important and have many functions in the body. Depending on why you are having the test, your health care provider may test your total complement or only some individual complements, such as C3 and C4. The total complement assay test may be done before individual complements aretested. What kind of sample is taken?  A blood sample is required for this test. It is usually collected by insertinga needle into a blood vessel. Tell a health care provider about: Any allergies you have. All medicines you are taking,  including vitamins, herbs, eye drops, creams, and over-the-counter medicines. Any blood disorders you have. Any surgeries you have had. Any medical  conditions you have. Whether you are pregnant or may be pregnant. How are the results reported? Your results will be reported as a value that tells you how much complement is in your blood. This will be given as units per milliliter of blood (units/mL) or as milligrams per deciliter of blood (mg/dL). Your results may be reportedas total complement, or as individual complements, or both. Your health care provider will compare your results to normal ranges that were established after testing a large group of people (reference ranges). Reference ranges may vary among labs and hospitals. For this test, reference ranges for some of the most commonly measured complement assays may be: Total complement: 30-75 units/mL. C2: 1-4 mg/dL. C3: 75-175 mg/dL. C4: 22-45 units/mL. What do the results mean? Results within reference ranges are considered normal, which means you have anormal amount of complement in your blood. Results that are higher than the reference ranges may be caused by: Inflammatory disease. Heart attack. Cancer. Complement deficiencies, or results lower than the reference ranges, may be caused by: Certain inherited conditions. Autoimmune disease. Certain liver diseases. Malnutrition. Certain types of anemia that result in breakdown of red blood cells (hemolytic anemia). Talk with your health care provider about what your results mean. Questions to ask your health care provider Ask your health care provider, or the department that is doing the test: When will my results be ready? How will I get my results? What are my treatment options? What other tests do I need? What are my next steps? Summary Complement refers to a group of proteins that are part of the body's disease-fighting system (immune system). A complement assay test can provide information about some or all of these proteins. You may have a complement assay test to help diagnose a complement deficiency, and to monitor  some infections or autoimmune disease. Talk with your health care provider about what your results mean.  Urinalysis Test Why am I having this test? You may have a urinalysis (UA) test: As part of routine wellness screening. Before surgery. During pregnancy. You may also need to have this test if you have: Kidney disease. Symptoms of a urinary tract infection (UTI). Diabetes. A condition that causes an imbalance in your hormones. What is being tested? A urinalysis is a series of tests done on a sample of your urine. Your kidneys filter your blood to make urine. They get rid of waste products and save the important parts of your blood, such as proteins and minerals (electrolytes). You may need more testing if your UA shows too much: Protein. Sugar (glucose). Blood cells. Bacteria. What kind of sample is taken? A urine sample is required for this test. How do I collect samples at home? You usually collect a urine sample by urinating into a clean cup. You may be asked to collect a urine sample first thing in the morning. When collecting a urine sample at home, make sure you: Use supplies and instructions that you received from the lab. Collect urine only in the germ-free (sterile) cup that you received from the lab. Do not let any toilet paper or stool (feces) get into the cup. Refrigerate the sample until you can return it to the lab. Return the sample or samples to the lab as told. Tell a health care provider about: All medicines you are taking, including vitamins, herbs, eye drops, creams, and  over-the-counter medicines. Any medical conditions you have. Whether you are pregnant or may be pregnant. What happens during the test? UA is divided into three parts: A visual exam of your urine sample to check for color or cloudiness. A dipstick test to check for: Proteins. Concentration (specific gravity). Acidity (pH). Glucose. Ketones. These are by-products of your body burning  fat for energy instead of sugar. A waste product from red blood cells (bilirubin). A product of white blood cells (leukocyte esterase). A product of bacteria (nitrite). Blood. A microscopic exam to check for: Red blood cells. White blood cells. Tube-shaped proteins (hyaline casts). Crystal structures. Bacteria. Epithelial cells. These are cells that line your urinary tract. Yeast. How are the results reported? Some of your test results will be reported as values. Your health care provider will compare your results to normal ranges that were established after testing a large group of people (reference ranges). Reference ranges may vary among different labs and hospitals. For this test, common reference ranges are: pH: 4.6-8.0 (average, 6.0). Protein: 0-8 mg/dL. 50-80 mg/24 hr (at rest). Less than 250 mg/24 hr (during exercise). Specific gravity: Adult: 1.005-1.030 (usually, 1.010-1.025). Elderly: values decrease with age. Newborn: 1.001-1.020. Nitrites: none. Ketones: none. Bilirubin: none. Urobilinogen: [id number] Ehrlich unit/mL. Crystals: none. Casts: none. Glucose: Fresh specimen: none. 24-hour specimen: 50-300 mg/24 hr or 0.3-1.7 mmol/day (SI units). White blood cells (WBCs): 0-4 per low-power field. WBC casts: none. Red blood cells (RBCs): Less than or equal to 2. RBC casts: none. Epithelial cells: Few or 0-4 per low-power field. Bacteria: none. Yeast: none. Other results may be reported based on the appearance and odor of the sample. For this test, normal results are: Appearance: clear. Color: amber yellow. Odor: aromatic. Still other results may be reported as positive or negative. For this test, normal results are: Negative for leukocyte esterase. What do the results mean? Many conditions can cause abnormal UA results: Cloudy urine may be a sign of a UTI. Acetone odor may indicate a buildup of blood acids in people who have diabetes (diabetic ketoacidosis). Fecal  odor can indicate an abnormal connection (fistula) between the intestine and the bladder. Ammonia odor can occur after a person holds urine in the bladder for too long. Pungent odor may indicate a UTI. Blood in the urine may be a sign of kidney disease, UTI, or other conditions. White blood cells may be a sign of a UTI. Crystals may be a sign of a kidney stone or other kidney disease. High pH may mean you have a kidney stone, UTI, or kidney disease. Protein may be a sign of kidney disease, high blood pressure in pregnancy (toxemia), or other conditions. Glucose may be a sign of diabetes. Urobilinogen may be a sign of liver disease. Leukocyte esterase may indicate a UTI. Nitrites may indicate a UTI. Talk with your health care provider about what your results mean. Questions to ask your health care provider Ask your health care provider, or the department that is doing the test: When will my results be ready? How will I get my results? What are my treatment options? What other tests do I need? What are my next steps? Summary A urinalysis (UA) is a series of tests done on a sample of your urine. The test may be ordered as part of a routine exam, during pregnancy, before surgery, or if you have certain symptoms. The urinalysis is divided into three parts: a visual exam, a dipstick test, and a microscopic exam. Your health care provider  will compare your results to normal ranges that were established after testing a large group of people (reference ranges). Talk with your health care provider about what your results mean. This information is not intended to replace advice given to you by your health care provider. Make sure you discuss any questions you have with your healthcare provider. Document Revised: 04/08/2020 Document Reviewed: 04/08/2020 [employer] Patient Education  [place].

## 2021-04-21 LAB — ANTI-SMITH ANTIBODY: ENA SM Ab Ser-aCnc: <1 AI

## 2021-04-21 LAB — ANTI-DNA ANTIBODY, DOUBLE-STRANDED: ds DNA Ab: <1 IU/mL

## 2021-04-21 LAB — RNP ANTIBODY: Ribonucleic Protein(ENA) Antibody, IgG: <1 AI

## 2021-04-21 LAB — URINALYSIS, ROUTINE W REFLEX MICROSCOPIC
Bilirubin Urine: NEGATIVE
Glucose, UA: NEGATIVE
Hgb urine dipstick: NEGATIVE
Ketones, ur: NEGATIVE
Leukocytes,Ua: NEGATIVE
Nitrite: NEGATIVE
Protein, ur: NEGATIVE
Specific Gravity, Urine: 1.001 (ref 1.001–1.035)
pH: 6 (ref 5.0–8.0)

## 2021-04-21 LAB — C3 AND C4
C3 Complement: 167 mg/dL (ref 83–193)
C4 Complement: 34 mg/dL (ref 15–57)

## 2021-04-24 ENCOUNTER — Telehealth: Payer: Self-pay

## 2021-04-24 NOTE — Telephone Encounter (Signed)
pt has stated she sent a MyChart message pertaining to the referral for a nutritionist x3 weeks ago that was sent out by Dr. Mitchel Honour and still has not heard from anyone.

## 2021-04-26 ENCOUNTER — Other Ambulatory Visit: Payer: Self-pay | Admitting: Emergency Medicine

## 2021-04-26 DIAGNOSIS — E785 Hyperlipidemia, unspecified: Secondary | ICD-10-CM

## 2021-04-26 NOTE — Telephone Encounter (Signed)
What would be the reason for a nutritionist referral?

## 2021-04-26 NOTE — Telephone Encounter (Signed)
Thank you.  Referral placed today.

## 2021-04-27 DIAGNOSIS — N6310 Unspecified lump in the right breast, unspecified quadrant: Secondary | ICD-10-CM | POA: Diagnosis not present

## 2021-04-27 NOTE — Progress Notes (Signed)
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 There is no evidence of damage or change coming from an inflammation cause. Lab tests are all negative for specific antibody markers associated with the ANA test. I do not recommend starting new medications at this time. We can follow up in about a year for her abnormal lab test to recheck if these progress unless need sooner for something such as changes with joint swelling or inflammation.

## 2021-04-28 ENCOUNTER — Ambulatory Visit: Payer: 59 | Admitting: Neurology

## 2021-04-28 NOTE — Telephone Encounter (Signed)
Called and left a message about pt referral.

## 2021-05-09 ENCOUNTER — Encounter: Payer: Self-pay | Admitting: Neurology

## 2021-05-09 ENCOUNTER — Ambulatory Visit: Payer: 59 | Admitting: Neurology

## 2021-05-09 VITALS — BP 114/64 | HR 78 | Ht 60.0 in | Wt 123.0 lb

## 2021-05-09 DIAGNOSIS — R519 Headache, unspecified: Secondary | ICD-10-CM

## 2021-05-09 DIAGNOSIS — R0683 Snoring: Secondary | ICD-10-CM

## 2021-05-09 DIAGNOSIS — G4726 Circadian rhythm sleep disorder, shift work type: Secondary | ICD-10-CM | POA: Diagnosis not present

## 2021-05-09 DIAGNOSIS — R351 Nocturia: Secondary | ICD-10-CM | POA: Diagnosis not present

## 2021-05-09 DIAGNOSIS — G4719 Other hypersomnia: Secondary | ICD-10-CM

## 2021-05-09 NOTE — Progress Notes (Signed)
Subjective:    Patient ID: Chelsey Hunt is a 55 y.o. female.  HPI    Star Age, MD, PhD [facility] [address], [address] [place], [place] [address]  Dear Dr. Mitchel Honour,  I saw your patient, Chelsey Hunt, upon your kind request in my sleep clinic today for initial consultation of her sleep disorder, in particular, concern for underlying obstructive sleep apnea.  The patient is unaccompanied today.  As you know, Chelsey Hunt is a 55 year old right-handed woman with an underlying medical history of reflux disease, anemia, positive ANA, hypothyroidism, irritable bowel syndrome, heart murmur, anxiety, migraine headaches (followed by Drs. Tomi Likens and Patel at [facility] neurology), who reports snoring and excessive daytime somnolence.  I reviewed your office note from 03/08/2021.  Her Epworth sleepiness score is 9/24, fatigue severity score is 33 out of 63.  She has woken up with a headache.  She does not feel rested.  She works night shift. She has worked nights for at least 9 years.  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  She loves her job but it is difficult for her to maintain a steady sleep schedule.  She works 3 nights a week and cannot do 3 nights in a row.  She works 7 AM to 7 PM 2 nights on and then an additional night per week.  When she is not working she sleeps at night, tries to be in bed around 8 PM and rise time is around 6 AM.  She does have nocturia about once per average night and has woken up occasionally with a headache.  She has not pursued Botox injections which were discussed recently with her neurologist.  Previously, she used to see Dr. Melton Alar, who retired.  When she works nights, she tries to be in bed between 830 and 9 AM.  She typically sleeps till 130 or 2 PM but some days she can sleep till 4 or 4:30 PM.  She lives with her husband and they recently added a puppy to the household some 2 months ago.  She has  had intermittent anxiety and does report intermittent palpitations.  She had seen cardiology in the past for her heart murmur and was evaluated, was then released.  She has 3 grown children and 15 grandchildren.  She is a non-smoker and has discontinued caffeine on a day-to-day basis, drinks 2 cups of decaf coffee at work.  She hydrates well with water, she does not drink any alcohol.  She has had a fairly stable weight.  She is the second youngest of a total of 10 children, she has a fraternal twin sister.   Her Past Medical History Is Significant For: Past Medical History:  Diagnosis Date   ACID REFLUX DISEASE 01/03/2008   Anemia    ANEMIA, IRON DEFICIENCY 12/23/2008   history   Anxiety    Chronic tension type headache 01/13/2009   migraines   Complication of anesthesia    pt states she just needs a small amount or she will sleep too long   Dysplasia of cervix, low grade (CIN 1)    Fibroid    GERD (gastroesophageal reflux disease)    HEART MURMUR, SYSTOLIC [id number]   will have echo 06/18/2013   HYPOTHYROIDISM, POST-RADIATION 08/12/2009   Irritable bowel syndrome 12/23/2008   Migraine    Ovarian cyst    Ovarian cyst    THYROID NODULE, RIGHT 04/21/2009    Her Past Surgical History  Is Significant For: Past Surgical History:  Procedure Laterality Date   ABDOMINAL HYSTERECTOMY     COLONOSCOPY     HYSTEROSCOPY     LAPAROSCOPY     NOVASURE ABLATION     POLYPECTOMY     TUBAL LIGATION     UPPER GI ENDOSCOPY     VAGINAL HYSTERECTOMY N/A 06/24/2013   Procedure: TOTAL HYSTERECTOMY VAGINAL;  Surgeon: Alwyn Pea, MD;  Location: [facility];  Service: Gynecology;  Laterality: N/A;    Her Family History Is Significant For: Family History  Problem Relation Age of Onset   Heart disease Mother    Thyroid disease Mother    Heart attack Mother    Cancer Father    Heart disease Father    Heart attack Father    Throat cancer Father    Lung cancer Father    Alcoholism Father    Graves'  disease Sister    Asthma Sister    Hepatitis Sister    Stomach cancer Sister    CVA Brother    Alzheimer's disease Brother    Goiter Brother    Hypertension Brother    Hypertension Brother    Alcoholism Brother    Hypertension Paternal Grandfather    Healthy Daughter    Goiter Other        Siblings 2   GER disease Son    GER disease Son    Depression Neg Hx    Alcohol abuse Neg Hx    Drug abuse Neg Hx    Early death Neg Hx    Hearing loss Neg Hx    Kidney disease Neg Hx    Stroke Neg Hx     Her Social History Is Significant For: Social History   Socioeconomic History   Marital status: Married    Spouse name: Shanon Brow   Number of children: 3   Years of education: Not on file   Highest education level: Associate degree: occupational, Hotel manager, or vocational program  Occupational History   Occupation: WOMENS    Employer: [employer]  Tobacco Use   Smoking status: Never   Smokeless tobacco: Never  Vaping Use   Vaping Use: Never used  Substance and Sexual Activity   Alcohol use: No   Drug use: No   Sexual activity: Yes    Birth control/protection: Surgical    Comment: BTL  Other Topics Concern   Not on file  Social History Narrative   Regular exercise-yes      Patient is right-handed.She lives with her husband in a 1 story home. She has recently been avoiding caffeine. Walks.   Social Determinants of Health   Financial Resource Strain: Not on file  Food Insecurity: Not on file  Transportation Needs: Not on file  Physical Activity: Not on file  Stress: Not on file  Social Connections: Not on file    Her Allergies Are:  Allergies  Allergen Reactions   Imitrex [Sumatriptan] Palpitations   Reglan [Metoclopramide] Palpitations   Propoxyphene N-Acetaminophen    Nabumetone Rash    REACTION: GI upset   Topiramate Rash    REACTION: rash  :   Her Current Medications Are:  Outpatient Encounter Medications as of 05/09/2021  Medication Sig   acetaminophen  (TYLENOL) 325 MG tablet Take 650 mg by mouth every 6 (six) hours as needed for mild pain.   aspirin EC 81 MG tablet Take 81 mg by mouth daily. Swallow whole.   co-enzyme Q-10 30 MG capsule Take 200  mg by mouth 3 (three) times daily.   cyclobenzaprine (FLEXERIL) 5 MG tablet Take 1 tablet (5 mg total) by mouth 3 (three) times daily as needed for muscle spasms.   ibuprofen (ADVIL,MOTRIN) 200 MG tablet Take 200 mg by mouth every 8 (eight) hours as needed.    omeprazole (PRILOSEC) 40 MG capsule Take 1 capsule (40 mg total) by mouth 2 (two) times daily.   promethazine (PHENERGAN) 25 MG tablet Take 1 tablet (25 mg total) by mouth every 6 (six) hours as needed for nausea or vomiting.   SYNTHROID 75 MCG tablet TAKE 1 TABLET (75 MCG TOTAL) BY MOUTH DAILY.   Ubrogepant (UBRELVY) 100 MG TABS Take 100 mg by mouth as needed.   valACYclovir (VALTREX) 1000 MG tablet TAKE 1 TABLET EVERY 12 HOURS BY MOUTH FOR 5 DAYS (Patient taking differently: as needed.)   [DISCONTINUED] DULoxetine (CYMBALTA) 20 MG capsule Take 1 capsule (20 mg total) by mouth daily. (Patient not taking: Reported on 04/20/2021)   [DISCONTINUED] rosuvastatin (CRESTOR) 10 MG tablet Take 1 tablet (10 mg total) by mouth daily. (Patient not taking: Reported on 04/20/2021)   No facility-administered encounter medications on file as of 05/09/2021.  :   Review of Systems:  Out of a complete 14 point review of systems, all are reviewed and negative with the exception of these symptoms as listed below:   Review of Systems  Neurological:        Pt is here today because PCP  referred her due to headaches in the morning and some fatigue. Pt has not had a sleep study done before Pt states she does not sleep through the night . Pt does work night shift. Pt states does not snore .   FSS:33  ESS:9   Objective:  Neurological Exam  Physical Exam Physical Examination:   Vitals:   05/09/21 0846  BP: 114/64  Pulse: 78    General Examination: The  patient is a very pleasant 55 y.o. female in no acute distress. She appears well-developed and well-nourished and well groomed.   HEENT: Normocephalic, atraumatic, pupils are equal, round and reactive to light, extraocular tracking is good without limitation to gaze excursion or nystagmus noted. Hearing is grossly intact. Face is symmetric with normal facial animation. Speech is clear with no dysarthria noted. There is no hypophonia. There is no lip, neck/head, jaw or voice tremor. Neck is supple with full range of passive and active motion. There are no carotid bruits on auscultation. Oropharynx exam reveals: No significant mouth dryness, good dental hygiene, mild airway narrowing due to small airway entry, Mallampati class II, tonsils on the smaller side, tongue protrudes centrally and palate elevates symmetrically.  Nasal inspection reveals small nasal passages but no inferior turbinate hypertrophy or septal deviation.  She has a minimal overbite.   Chest: Clear to auscultation without wheezing, rhonchi or crackles noted.  Heart: S1+S2+0, regular and normal without murmurs, rubs or gallops noted.   Abdomen: Soft, non-tender and non-distended with normal bowel sounds appreciated on auscultation.  Extremities: There is no pitting edema in the distal lower extremities bilaterally.   Skin: Warm and dry without trophic changes noted.   Musculoskeletal: exam reveals no obvious joint deformities, tenderness or joint swelling or erythema.   Neurologically:  Mental status: The patient is awake, alert and oriented in all 4 spheres. Her immediate and remote memory, attention, language skills and fund of knowledge are appropriate. There is no evidence of aphasia, agnosia, apraxia or anomia. Speech is clear  with normal prosody and enunciation. Thought process is linear. Mood is normal and affect is normal.  Cranial nerves II - XII are as described above under HEENT exam.  Motor exam: Normal bulk, strength  and tone is noted. There is no tremor, Romberg is negative. Reflexes are 1+ throughout, toes are downgoing bilaterally. Fine motor skills and coordination: grossly intact.  Cerebellar testing: No dysmetria or intention tremor. There is no truncal or gait ataxia.  Sensory exam: intact to light touch in the upper and lower extremities.  Gait, station and balance: She stands easily. No veering to one side is noted. No leaning to one side is noted. Posture is age-appropriate and stance is narrow based. Gait shows normal stride length and normal pace. No problems turning are noted. Tandem walk is unremarkable.                Assessment and Plan:  In summary, Jeneanne Lozzi is a very pleasant 55 y.o.-year old female ith an underlying medical history of reflux disease, anemia, positive ANA, hypothyroidism, irritable bowel syndrome, heart murmur, anxiety, migraine headaches (followed by Drs. Tomi Likens and Patel at [facility] neurology), whose history and physical exam are concerning for obstructive sleep apnea (OSA).  Her shifting sleep and work schedule likely contributes to her nonrestorative sleep, daytime somnolence.  I had a long chat with the patient about my findings and the diagnosis of OSA, its prognosis and treatment options. We talked about medical treatments, surgical interventions and non-pharmacological approaches. I explained in particular the risks and ramifications of untreated moderate to severe OSA, especially with respect to developing cardiovascular disease down the Road, including congestive heart failure, difficult to treat hypertension, cardiac arrhythmias, or stroke. Even type 2 diabetes has, in part, been linked to untreated OSA. Symptoms of untreated OSA include daytime sleepiness, memory problems, mood irritability and mood disorder such as depression and anxiety, lack of energy, as well as recurrent headaches, especially morning headaches. We talked about trying to maintain a healthy  lifestyle in general, as well as the importance of weight control. We also talked about the importance of good sleep hygiene. I recommended the following at this time: sleep study. I explained the sleep test procedure to the patient and also outlined possible surgical and non-surgical treatment options of OSA, including the use of a custom-made dental device (which would require a referral to a specialist dentist or oral surgeon), surgical options, including in the form of Inspire - hypoglossal nerve stimulation (which would involve a referral to an ENT surgeon). I also explained the CPAP treatment option to the patient, who indicated that she would be willing to try CPAP if the need arises, but she does report being claustrophobic.  I reassured her that we could look into minimally intrusive options such as nasal pillows or nasal cushion interfaces. We will pick up our discussion after testing and we will plan a follow-up accordingly.  I answered all her questions today and she was in agreement.   Thank you very much for allowing me to participate in the care of this nice patient. If I can be of any further assistance to you please do not hesitate to call me at [phone].  Sincerely,   Star Age, MD, PhD

## 2021-05-09 NOTE — Patient Instructions (Signed)
Thank you for choosing [facility] for your sleep related care! It was nice to meet you today!   Based on your symptoms and your exam I believe you are at risk for obstructive sleep apnea (aka OSA), and I think we should proceed with a sleep study to determine whether you do or do not have OSA and how severe it is. Even, if you have mild OSA, I may want you to consider treatment with CPAP, as treatment of even borderline or mild sleep apnea can result and improvement of symptoms such as sleep disruption, daytime sleepiness, nighttime bathroom breaks, restless leg symptoms, improvement of headache syndromes, even improved mood disorder.   As explained, an attended sleep study meaning you get to stay overnight in the sleep lab, lets [place] monitor sleep-related behaviors such as sleep talking and leg movements in sleep, in addition to monitoring for sleep apnea.  A home sleep test is a screening tool for sleep apnea only, and unfortunately does not help with any other sleep-related diagnoses.  Please remember, the long-term risks and ramifications of untreated moderate to severe obstructive sleep apnea are: increased Cardiovascular disease, including congestive heart failure, stroke, difficult to control hypertension, treatment resistant obesity, arrhythmias, especially irregular heartbeat commonly known as A. Fib. (atrial fibrillation); even type 2 diabetes has been linked to untreated OSA.   Sleep apnea can cause disruption of sleep and sleep deprivation in most cases, which, in turn, can cause recurrent headaches, problems with memory, mood, concentration, focus, and vigilance. Most people with untreated sleep apnea report excessive daytime sleepiness, which can affect their ability to drive. Please do not drive if you feel sleepy. Patients with sleep apnea can also develop difficulty initiating and maintaining sleep (aka insomnia).   Having sleep apnea may increase your risk for other  sleep disorders, including involuntary behaviors sleep such as sleep terrors, sleep talking, sleepwalking.    Having sleep apnea can also increase your risk for restless leg syndrome and leg movements at night.   Please note that untreated obstructive sleep apnea may carry additional perioperative morbidity. Patients with significant obstructive sleep apnea (typically, in the moderate to severe degree) should receive, if possible, perioperative PAP (positive airway pressure) therapy and the surgeons and particularly the anesthesiologists should be informed of the diagnosis and the severity of the sleep disordered breathing.   I will likely see you back after your sleep study to go over the test results and where to go from there. We will call you after your sleep study to advise about the results (most likely, you will hear from [facility], my nurse) and to set up an appointment at the time, as necessary.    Our sleep lab administrative assistant will call you to schedule your sleep study and give you further instructions, regarding the check in process for the sleep study, arrival time, what to bring, when you can expect to leave after the study, etc., and to answer any other logistical questions you may have. If you don't hear back from her by about 2 weeks from now, please feel free to call her direct line at [phone] or you can call our general clinic number, or email [place] through My Chart.

## 2021-05-11 ENCOUNTER — Telehealth: Payer: Self-pay | Admitting: Neurology

## 2021-05-11 NOTE — Telephone Encounter (Signed)
LVM for pt to call me back to schedule sleep study  

## 2021-05-25 DIAGNOSIS — N6310 Unspecified lump in the right breast, unspecified quadrant: Secondary | ICD-10-CM | POA: Diagnosis not present

## 2021-06-07 ENCOUNTER — Ambulatory Visit (INDEPENDENT_AMBULATORY_CARE_PROVIDER_SITE_OTHER): Payer: 59 | Admitting: Neurology

## 2021-06-07 DIAGNOSIS — G4733 Obstructive sleep apnea (adult) (pediatric): Secondary | ICD-10-CM | POA: Diagnosis not present

## 2021-06-07 DIAGNOSIS — R519 Headache, unspecified: Secondary | ICD-10-CM

## 2021-06-07 DIAGNOSIS — R0683 Snoring: Secondary | ICD-10-CM

## 2021-06-07 DIAGNOSIS — G4719 Other hypersomnia: Secondary | ICD-10-CM

## 2021-06-07 DIAGNOSIS — G4726 Circadian rhythm sleep disorder, shift work type: Secondary | ICD-10-CM

## 2021-06-07 DIAGNOSIS — R351 Nocturia: Secondary | ICD-10-CM

## 2021-06-16 ENCOUNTER — Ambulatory Visit: Payer: 59 | Admitting: Neurology

## 2021-06-19 NOTE — Progress Notes (Signed)
See procedure note.

## 2021-06-20 NOTE — Procedures (Signed)
   [facility]  HOME SLEEP TEST (Watch PAT) REPORT  STUDY DATE: 06/07/2021  DOB: 1966-07-30  MRN: [id number]  ORDERING CLINICIAN: Star Age, MD, PhD   REFERRING CLINICIAN: Horald Pollen, MD   CLINICAL INFORMATION/HISTORY:  55 year old right-handed woman with an underlying medical history of reflux disease, anemia, positive ANA, hypothyroidism, irritable bowel syndrome, heart murmur, anxiety, migraine headaches (followed by Drs. Tomi Likens and Patel at [facility] neurology), who reports snoring and excessive daytime somnolence.  Epworth sleepiness score: 9/24.  BMI: 24.2 kg/m  FINDINGS:   Sleep Summary:   Total Recording Time (hours, min): 9 hours, 21 minutes  Total Sleep Time (hours, min):  7 hours, 41 minutes   Percent REM (%):    27.4%   Respiratory Indices:   Calculated pAHI (per hour):  17.0/hour         REM pAHI:    13.3/hour       NREM pAHI: 18.4/hour  Oxygen Saturation Statistics:    Oxygen Saturation (%) Mean: 94%   Minimum oxygen saturation (%):                 58% (possibly erroneous, nadir could be in the mid to high 60s).   O2 Saturation Range (%): 58-100%    O2 Saturation (minutes) <=88%: 7.8 min  Pulse Rate Statistics:   Pulse Mean (bpm):    68/min    Pulse Range (68-107/min)   IMPRESSION: OSA (obstructive sleep apnea)   RECOMMENDATION:  This home sleep test demonstrates moderate obstructive sleep apnea with a total AHI of 17/hour and O2 nadir in the mid to high 60s% -the computer-generated nadir of 58% could be erroneous. Treatment with positive airway pressure is recommended. The patient will be advised to proceed with an autoPAP titration/trial at home for now. A full night titration study may be considered to optimize treatment settings, if needed down the road. Please note that untreated obstructive sleep apnea may carry additional perioperative morbidity. Patients with significant obstructive sleep apnea should receive  perioperative PAP therapy and the surgeons and particularly the anesthesiologist should be informed of the diagnosis and the severity of the sleep disordered breathing. The patient should be cautioned not to drive, work at heights, or operate dangerous or heavy equipment when tired or sleepy. Review and reiteration of good sleep hygiene measures should be pursued with any patient. Other causes of the patient's symptoms, including circadian rhythm disturbances, an underlying mood disorder, medication effect and/or an underlying medical problem cannot be ruled out based on this test. Clinical correlation is recommended. The patient and her referring provider will be notified of the test results. The patient will be seen in follow up in sleep clinic at [facility].  I certify that I have reviewed the raw data recording prior to the issuance of this report in accordance with the standards of the American Academy of Sleep Medicine (AASM).  INTERPRETING PHYSICIAN:   Star Age, MD, PhD  Board Certified in Neurology and Sleep Medicine  [facility] [address] [place], [place] [address] [phone]

## 2021-06-20 NOTE — Addendum Note (Signed)
Addended by: Star Age on: 06/20/2021 06:03 PM   Modules accepted: Orders

## 2021-06-21 ENCOUNTER — Telehealth: Payer: Self-pay

## 2021-06-21 ENCOUNTER — Encounter: Payer: Self-pay | Admitting: *Deleted

## 2021-06-21 NOTE — Telephone Encounter (Signed)
I called patient to discuss. No answer, left a message asking her to call me back. ?

## 2021-06-21 NOTE — Telephone Encounter (Signed)
Pt is asking for a call back from RN 

## 2021-06-21 NOTE — Telephone Encounter (Signed)
-----   Message from Star Age, MD sent at 06/20/2021  6:03 PM EDT ----- Patient referred by Dr. Mitchel Honour, seen by me on 05/09/2021, patient had a HST on 06/07/2021.    Please call and notify the patient that the recent home sleep test showed obstructive sleep apnea in the moderate range. I recommend treatment in the form of autoPAP, which means, that we don't have to bring her in for a sleep study with CPAP, but will let her start using a so called autoPAP machine at home, which is a CPAP-like machine with self-adjusting pressures. We will send the order to a local DME company (of her choice, or as per insurance requirement). The DME representative will fit her with a mask, educate her on how to use the machine, how to put the mask on, etc. I have placed an order in the chart. Please send the order, talk to patient, send report to referring MD. We will need a FU in sleep clinic for 10 weeks post-PAP set up, please arrange that with me or one of our NPs. Also reinforce the need for compliance with treatment. Thanks,   Star Age, MD, PhD [facility])

## 2021-06-21 NOTE — Telephone Encounter (Signed)
I called pt. I advised pt that Dr. Rexene Alberts reviewed their sleep study results and found that pt has moderate OSA. Dr. Rexene Alberts recommends that pt start autopap. I reviewed PAP compliance expectations with the pt. Pt is agreeable to starting an auto-PAP. I advised pt that an order will be sent to a DME, [employer], and they will call the pt within about one week after they file with the pt's insurance. [employer] will show the pt how to use the machine, fit for masks, and troubleshoot the auto-PAP if needed. A follow up appt was made for insurance purposes with Dr. Rexene Alberts when she receives a machine. Pt verbalized understanding to arrive 15 minutes early and bring their auto-PAP. A letter with all of this information in it will be mailed to the pt as a reminder. I verified with the pt that the address we have on file is correct. Pt verbalized understanding of results. Pt had no questions at this time but was encouraged to call back if questions arise. I have sent the order to [employer] and have received confirmation that they have received the order.

## 2021-06-23 ENCOUNTER — Other Ambulatory Visit (HOSPITAL_COMMUNITY): Payer: Self-pay

## 2021-06-28 ENCOUNTER — Encounter: Payer: Self-pay | Admitting: Registered"

## 2021-06-28 ENCOUNTER — Encounter: Payer: 59 | Attending: Emergency Medicine | Admitting: Registered"

## 2021-06-28 ENCOUNTER — Other Ambulatory Visit: Payer: Self-pay

## 2021-06-28 DIAGNOSIS — E785 Hyperlipidemia, unspecified: Secondary | ICD-10-CM | POA: Diagnosis not present

## 2021-06-28 NOTE — Progress Notes (Signed)
Medical Nutrition Therapy  Appointment Start time:  [phone]  Appointment End time:  74  Primary concerns today: wants to be healthy, lower cholesterol  Referral diagnosis: E78.5 (ICD-10-CM) - Dyslipidemia Preferred learning style: no preference indicated Learning readiness: ready   NUTRITION ASSESSMENT   Anthropometrics  Wt Readings from Last 3 Encounters:  06/29/21 130 lb 12.8 oz (59.3 kg)  05/09/21 123 lb (55.8 kg)  04/20/21 126 lb 9.6 oz (57.4 kg)     Clinical Medical Hx: reviewed Medications: reviewed (omeprazole 2x day) Labs 02/21/21: A1c 6.0%; Vit D 32.51 ng/mL (30-100) Lipid panel 03/08/21: Chol 228-253, LDL 141-169 Notable Signs/Symptoms: a little fatigue  Lifestyle & Dietary Hx 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  Pt states she has less fluid intake when working. Pt states she doesn't drink much close to bed time because interrupted sleep triggers migraines.   Pt states she hs a stress eater and feels the desire to eat when on the phone. Pt states she likes healthy foods such as salad, sweet potato, broccoli, chicken. Pt states she really enjoys the sesame savory snacks. Pt states it is hard to make healthy snack choices at work since sweet and chips are provided for employees and has sweet cravings during night shift.  Estimated daily fluid intake: 84 oz on days off; 16 oz water, 8-16 oz coffee Supplements: turmeric (migraine), oregano Sleep: 4-6 hrs works night shift, switches on days off Stress / self-care: 7/10 ; listen to praise and worship, comedy shows Current average weekly physical activity: ADL active at work  24-Hr Dietary Recall First Meal: trail mix with sesame snacks Snack: fruit  Second Meal: [employer] sandwich, cupcake  Snack: nuts and/or fruit Third Meal: cereal (Special K) Snack: nestle tollhouse cookies 2x/week Beverages: lemon water, coffee flavored creamer (9-12 g cho), herbal tea with honey 2 tsp & lemon  Estimated  Energy Needs Calories: 1700-1800   NUTRITION DIAGNOSIS  NB-1.1 Food and nutrition-related knowledge deficit As related to macro nutrient and food quality.  As evidenced by snacking on sesame sticks.   NUTRITION INTERVENTION  Nutrition education (E-1) on the following topics:  Reading Labels Types of fat and effect on cholesterol MyPlate A1c  Handouts Provided Include  A1c chart Reading Labels Types of Fat MyPlate Planner [place]  Learning Style & Readiness for Change Teaching method utilized: Visual & Auditory  Demonstrated degree of understanding via: Teach Back  Barriers to learning/adherence to lifestyle change: none  Goals Established by Pt Read labels for sat fat and carbohydrates Bring snacks to work, Manufacturing engineer to provide healthier options for free snacks brought in Use MyPlate for guidance for balanced eating. Keep an eye on your future A1c lab work   MONITORING & EVALUATION Dietary intake, weekly physical activity, and stress eating behavoirs in 4-6 weeks.  Next Steps  Patient is to work on goals, paying attention to what foods have carbohydrates and balance them with protein. Return for evaluation of progress.

## 2021-06-28 NOTE — Progress Notes (Signed)
NEUROLOGY FOLLOW UP OFFICE NOTE  Chelsey Hunt [id number]  Assessment/Plan:   Chronic migraine without aura, without status migrainosus, intractable Abnormal white matter on brain MRI - unclear etiology.  At this time, unknown clinical significance. Numbness and tingling in hands - ulnar neuropathies at elbow, but also consider mild carpal tunnel syndrome not detected on NCV  1  She chooses to continue non-pharmacologic management:  Chiropractor, treatment of OSA, diet, exercise - also will try magnesium citrate 400mg  daily, CoQ10 100mg  three times daily and riboflavin 400mg  daily 2  Ubrelvy as needed 3  Follow up in 6 months.  Subjective:  Chelsey Hunt is a 55 year old right-handed woman with hypothyroidism who follows up for migraines.   UPDATE: Last seen in July.  She was agreeable to Botox but she later changed her mind.  She was given the option of Qulipta, or again, Vyepti.  Started going to the chiropractor which helped.  She thinks headaches overall improved.  Migraines are now occurring every other day but are mild-moderate but has 2 severe migraines a month, aborted with [place].  To further evaluate nonspecific white matter changes on brain MRI, underwent workup for vasculitis.  SSA/SSB antibodies and pan-ANCA panel were negative.  Sed rate was 14 and CRP negative.  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     She saw sleep medicine.  She was found to have moderate OSA and was ordered auto-PAP.    Current NSAIDS: ASA 81mg  Current analgesics: none Current triptans: None Current ergotamine: None Current abortive anti-CGRP:  none Current anti-emetic: Promethazine Current muscle relaxants: Cyclobenzaprine 10 mg Current anti-anxiolytic: None Current sleep aide: None Current Antihypertensive medications: None Current Antidepressant medications: none Current  Anticonvulsant medications: gabapentin 100mg  QHS (for scalp neuralgia; cannot tolerate higher doses) Current anti-CGRP: Ubrelvy 100mg  Current Vitamins/Herbal/Supplements: Co Q-10; turmeric Current Antihistamines/Decongestants: None Other therapy: Physical therapy for neck pain; dry needling Hormone/birth control: None   Caffeine: No Diet: 5-6 bottles of water daily Exercise: Not routine Depression: No; Anxiety: Yes Other pain:  neck pain.  She has cervical spondylosis Sleep hygiene: Poor   HISTORY: Onset: First migraine was between 59 and 13 years old.  Migraine free until 2004 during her menstrual cycle.  Chronic for many years. Location:  Left more than right. Quality:  throbbing Initial intensity:  Severe.   Aura:  no Prodrome:  no Postdrome:  fatigue Associated symptoms:  Right facial tingling, nausea, photophobia, phonophobia, osmophobia.  She denies associated vomiting, visual disturbance or unilateral numbness or weakness. Initial duration:  3 days Initial Frequency:  daily Initial Frequency of abortive medication: Advil or Tylenol daily, Maxalt 2 days a week Triggers:  Certain smells, change in barometric pressure, sausages, certain cheese, bananas; brazilian nutes, COVID-19 (contracted in September 2020) Relieving factors: Exercise, staying hydrated Activity:  aggravates   During summer 2019, she has had throbbing occipital headache, right-sided neck pain radiating up to the occiput, a burning scalp neuralgia, associated with tinnitus, sometimes left or right sided facial numbness, facial twitching.  She started having a panic attack with palpitations.  She went to the ED on 06/26/18 for symptoms.  EKG showed sinus tachycardia.  She was treated with headache cocktail.  To evaluate worsening headaches, she had an MRI of the brain on 11/21/2018 which demonstrated nonspecific white matter hyperintensities in the cerebral white matter, likely chronic small vessel ischemic changes,  but no acute abnormality.  For further evaluation  of neck pain, cervical spine X-ray on 04/10/2019 showed slight refersal of the normal cervical lordosis with minimal retrolisthesis of C5 on C6 and cervical spondylosis with osteophytes most notable at C4-5 and C5-6.    Due to fatigue, her PCP checked for autoimmune condition.  ANA was positive with positive cyclic citrullin peptide antibody.  She has been referred to rheumatology.     She has bilateral tinnitus.  Exam by ENT unremarkable.    Past NSAIDS: Ibuprofen, naproxen, Cambia Past analgesics:  Excedrin, Lidocaine nasal Past abortive triptans: Relpax, sumatriptan tablet, Maxalt, Tosymra NS, Zembrace.   Past abortive ergotamine: Migranal Past muscle relaxants:  none Past anti-emetic:  Zofran Past antihypertensive medications: Verapamil, propranolol Past antidepressant medications: Imipramine, nortriptyline Past anticonvulsant medications:  topiramate Past anti-CGRP:  Aimovig (aggravated migraine), Emgality (ineffective), Ajovy, Nurtec Past vitamins/Herbal/Supplements: Riboflavin, magnesium Past antihistamines/decongestants:  None Other past therapies:  acupuncture (triggered migraine), Reyvow   Family history of headache:  Mom (migraines), daughter (migraines)   MRI and MRA of the head from 03/09/2009 revealed nonspecific small subcortical white matter hyperintensities and 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  MRI of the brain on 11/21/2018 which demonstrated nonspecific white matter hyperintensities in the cerebral white matter.  To evaluate new visual aura with migraine (hair floating in her vision), MRI of brain with and without contrast performed on 02/10/2020 again demonstrated non-enhancing white matter T2 and FLAIR signal in the cerebral hemispheres, stable compared to prior imaging from 2020.  To further evaluate abnormal white matter on brain MRI, she had MRI of cervical and thoracic spine  without contrast on 03/26/2020 which showed no cord lesions but did show incidental 1 cm T1 and T2 dark ovoid lesion in the T6 vertebral body.  Follow up MRI thoracic spine with contrast on 05/25/2020 showed no change and no enhancement, thought to represent benign marrow focus .  She was evaluated by ophthalmology in September 2021 which demonstrated dry eye but no concerning findings such as optic neuritis.  PAST MEDICAL HISTORY: Past Medical History:  Diagnosis Date   ACID REFLUX DISEASE 01/03/2008   Anemia    ANEMIA, IRON DEFICIENCY 12/23/2008   history   Anxiety    Chronic tension type headache 01/13/2009   migraines   Complication of anesthesia    pt states she just needs a small amount or she will sleep too long   Dysplasia of cervix, low grade (CIN 1)    Fibroid    GERD (gastroesophageal reflux disease)    HEART MURMUR, SYSTOLIC 17/51/0258   will have echo 06/18/2013   HYPOTHYROIDISM, POST-RADIATION 08/12/2009   Irritable bowel syndrome 12/23/2008   Migraine    Ovarian cyst    Ovarian cyst    THYROID NODULE, RIGHT 04/21/2009    MEDICATIONS: Current Outpatient Medications on File Prior to Visit  Medication Sig Dispense Refill   acetaminophen (TYLENOL) 325 MG tablet Take 650 mg by mouth every 6 (six) hours as needed for mild pain.     aspirin EC 81 MG tablet Take 81 mg by mouth daily. Swallow whole.     co-enzyme Q-10 30 MG capsule Take 200 mg by mouth 3 (three) times daily.     cyclobenzaprine (FLEXERIL) 5 MG tablet Take 1 tablet (5 mg total) by mouth 3 (three) times daily as needed for muscle spasms. 30 tablet 5   ibuprofen (ADVIL,MOTRIN) 200 MG tablet Take 200 mg by mouth every 8 (eight) hours as needed.  omeprazole (PRILOSEC) 40 MG capsule Take 1 capsule (40 mg total) by mouth 2 (two) times daily. 180 capsule 1   promethazine (PHENERGAN) 25 MG tablet Take 1 tablet (25 mg total) by mouth every 6 (six) hours as needed for nausea or vomiting. 30 tablet 5   SYNTHROID 75 MCG  tablet TAKE 1 TABLET (75 MCG TOTAL) BY MOUTH DAILY. 90 tablet 3   Ubrogepant (UBRELVY) 100 MG TABS Take 100 mg by mouth as needed.     valACYclovir (VALTREX) 1000 MG tablet TAKE 1 TABLET EVERY 12 HOURS BY MOUTH FOR 5 DAYS (Patient taking differently: as needed.) 30 tablet 11   No current facility-administered medications on file prior to visit.    ALLERGIES: Allergies  Allergen Reactions   Imitrex [Sumatriptan] Palpitations   Reglan [Metoclopramide] Palpitations   Propoxyphene N-Acetaminophen    Nabumetone Rash    REACTION: GI upset   Topiramate Rash    REACTION: rash    FAMILY HISTORY: Family History  Problem Relation Age of Onset   Heart disease Mother    Thyroid disease Mother    Heart attack Mother    Cancer Father    Heart disease Father    Heart attack Father    Throat cancer Father    Lung cancer Father    Alcoholism Father    Graves' disease Sister    Asthma Sister    Hepatitis Sister    Stomach cancer Sister    CVA Brother    Alzheimer's disease Brother    Goiter Brother    Hypertension Brother    Hypertension Brother    Alcoholism Brother    Hypertension Paternal Grandfather    Healthy Daughter    Goiter Other        Siblings 2   GER disease Son    GER disease Son    Depression Neg Hx    Alcohol abuse Neg Hx    Drug abuse Neg Hx    Early death Neg Hx    Hearing loss Neg Hx    Kidney disease Neg Hx    Stroke Neg Hx       Objective:  Blood pressure 126/68, pulse 64, height 5' (1.524 m), weight 130 lb 12.8 oz (59.3 kg), last menstrual period 04/20/2013, SpO2 96 %. General: No acute distress.  Patient appears well-groomed.     Metta Clines, DO  CC: Chelsey Caroli, MD

## 2021-06-29 ENCOUNTER — Ambulatory Visit: Payer: 59 | Admitting: Neurology

## 2021-06-29 ENCOUNTER — Encounter: Payer: Self-pay | Admitting: Neurology

## 2021-06-29 VITALS — BP 126/68 | HR 64 | Ht 60.0 in | Wt 130.8 lb

## 2021-06-29 DIAGNOSIS — G43709 Chronic migraine without aura, not intractable, without status migrainosus: Secondary | ICD-10-CM

## 2021-06-29 DIAGNOSIS — G4733 Obstructive sleep apnea (adult) (pediatric): Secondary | ICD-10-CM | POA: Diagnosis not present

## 2021-06-29 DIAGNOSIS — G5623 Lesion of ulnar nerve, bilateral upper limbs: Secondary | ICD-10-CM

## 2021-06-29 NOTE — Patient Instructions (Signed)
  Start magnesium citrate 400mg  daily, riboflavin 400mg  daily, coenzyme Q10 100mg  three times daily. Take [name] as needed Limit use of pain relievers to no more than 2 days out of the week.  These medications include acetaminophen, NSAIDs (ibuprofen/Advil/Motrin, naproxen/Aleve, triptans (Imitrex/sumatriptan), Excedrin, and narcotics.  This will help reduce risk of rebound headaches. Be aware of common food triggers:  - Caffeine:  coffee, black tea, cola, Mt. Dew  - Chocolate  - Dairy:  aged cheeses (brie, blue, cheddar, gouda, [place], provolone, [place], Swiss, etc), chocolate milk, buttermilk, sour cream, limit eggs and yogurt  - Nuts, peanut butter  - Alcohol  - Cereals/grains:  FRESH breads (fresh bagels, sourdough, doughnuts), yeast productions  - Processed/canned/aged/cured meats (pre-packaged deli meats, hotdogs)  - MSG/glutamate:  soy sauce, flavor enhancer, pickled/preserved/marinated foods  - Sweeteners:  aspartame (Equal, Nutrasweet).  Sugar and Splenda are okay  - Vegetables:  legumes (lima beans, lentils, snow peas, fava beans, pinto peans, peas, garbanzo beans), sauerkraut, onions, olives, pickles  - Fruit:  avocados, bananas, citrus fruit (orange, lemon, grapefruit), mango  - Other:  Frozen meals, macaroni and cheese Routine exercise Stay adequately hydrated (aim for 64 oz water daily) Keep headache diary Maintain proper stress management Maintain proper sleep hygiene Do not skip meals Wear wrist splints at night and try not to lean on your elbows

## 2021-07-21 ENCOUNTER — Other Ambulatory Visit (HOSPITAL_COMMUNITY): Payer: Self-pay

## 2021-07-21 MED ORDER — AMOXICILLIN 500 MG PO CAPS
500.0000 mg | ORAL_CAPSULE | Freq: Three times a day (TID) | ORAL | 0 refills | Status: DC
Start: 2021-07-21 — End: 2021-09-07
  Filled 2021-07-21: qty 21, 7d supply, fill #0

## 2021-07-25 ENCOUNTER — Ambulatory Visit: Payer: 59 | Admitting: Neurology

## 2021-08-02 ENCOUNTER — Other Ambulatory Visit: Payer: Self-pay | Admitting: Emergency Medicine

## 2021-08-02 ENCOUNTER — Encounter: Payer: Self-pay | Admitting: Emergency Medicine

## 2021-08-02 ENCOUNTER — Other Ambulatory Visit (HOSPITAL_COMMUNITY): Payer: Self-pay

## 2021-08-02 ENCOUNTER — Other Ambulatory Visit: Payer: Self-pay | Admitting: Neurology

## 2021-08-02 DIAGNOSIS — E039 Hypothyroidism, unspecified: Secondary | ICD-10-CM

## 2021-08-02 MED ORDER — LEVOTHYROXINE SODIUM 75 MCG PO TABS
75.0000 ug | ORAL_TABLET | Freq: Every day | ORAL | 3 refills | Status: DC
Start: 2021-08-02 — End: 2022-08-20
  Filled 2021-08-02 (×2): qty 90, 90d supply, fill #0
  Filled 2021-11-02: qty 90, 90d supply, fill #1
  Filled 2022-02-06: qty 90, 90d supply, fill #2
  Filled 2022-05-17: qty 90, 90d supply, fill #3

## 2021-08-02 MED ORDER — UBRELVY 100 MG PO TABS
100.0000 mg | ORAL_TABLET | ORAL | 3 refills | Status: DC | PRN
  Filled 2021-08-02: qty 16, 30d supply, fill #0

## 2021-08-14 ENCOUNTER — Other Ambulatory Visit: Payer: Self-pay

## 2021-08-14 ENCOUNTER — Encounter: Payer: 59 | Attending: Emergency Medicine | Admitting: Registered"

## 2021-08-14 DIAGNOSIS — E785 Hyperlipidemia, unspecified: Secondary | ICD-10-CM | POA: Diagnosis not present

## 2021-08-14 NOTE — Progress Notes (Signed)
Medical Nutrition Therapy  Appointment Start time:  7902  Appointment End time:  1555  Primary concerns today: wants to have a meal plan, lower cholesterol  Referral diagnosis: E78.5 (ICD-10-CM) - Dyslipidemia Preferred learning style: no preference indicated Learning readiness: ready   NUTRITION ASSESSMENT   Anthropometrics  Wt Readings from Last 3 Encounters:  06/29/21 130 lb 12.8 oz (59.3 kg)  05/09/21 123 lb (55.8 kg)  04/20/21 126 lb 9.6 oz (57.4 kg)     Clinical Medical Hx: reviewed Medications: reviewed (omeprazole 2x day, changed to generic synthroid) Labs 02/21/21: A1c 6.0%; Vit D 32.51 ng/mL (30-100) Lipid panel 03/08/21: Chol 228-253, LDL 141-169 Notable Signs/Symptoms: a little fatigue  Lifestyle & Dietary Hx Employee states she has cut back on crackers, yesterday had some bread, but tries not to have bread. Reports when working 3rd shift drinking decaf coffee and creamer, working on reducing the sugar with the creamer. Pt states she needs something to keep up her energy, might eat a granola bar, or just one little candy.   Pt states she had [employer]fil[employer]a peppermint shakes and [employer] peppermint shakes and states they were too sweet.  Pt states she thinks her cholesterol will be retest at next MD visit  Wants to have a meal plan  Pt states she hs a stress eater and feels the desire to eat when on the phone. Pt states she likes healthy foods such as salad, sweet potato, broccoli, chicken. Pt states she really enjoys the sesame savory snacks. Pt states it is hard to make healthy snack choices at work since sweet and chips are provided for employees and has sweet cravings during night shift.  Estimated daily fluid intake: 84 oz on days off; 16 oz water, 8-16 oz coffee Supplements: turmeric (migraine), oregano Sleep: 4-6 hrs works night shift, switches on days off Stress / self-care: 7/10 ; listen to praise and worship, comedy shows Current average weekly physical  activity: ADL active at work  24-Hr Dietary Recall First Meal: trail mix with sesame snacks Snack: fruit  Second Meal: [employer] sandwich, cupcake  Snack: nuts and/or fruit Third Meal: cereal (Special K) Snack: nestle tollhouse cookies 2x/week Beverages: lemon water, coffee flavored creamer (9-12 g cho), herbal tea with honey 2 tsp & lemon  Estimated Energy Needs Calories: 1700-1800   NUTRITION DIAGNOSIS  NB-1.1 Food and nutrition-related knowledge deficit As related to macro nutrient and food quality.  As evidenced by snacking on sesame sticks.   NUTRITION INTERVENTION  Nutrition education (E-1) on the following topics:  Reviewed balanced eating Complex carb vs simple carb  Handouts Provided Include  MyPlate meal planner  Learning Style & Readiness for Change Teaching method utilized: Visual & Auditory  Demonstrated degree of understanding via: Teach Back  Barriers to learning/adherence to lifestyle change: none  Goals Established by Pt Read labels 1 day per week look at nutrition facts labels for: Saturated Fat, Carbs, Sodium Continue to be mindful about sweet snacks Bring snacks to work, Manufacturing engineer to provide healthier options for free snacks brought in. Continue to choose the nut options. Use MyPlate for guidance for balanced eating, and creating meal plans   MONITORING & EVALUATION Dietary intake, weekly physical activity, and stress eating behavoirs in 4-6 weeks.  Next Steps  Patient is to work on goals, paying attention to what foods have carbohydrates and balance them with protein, reduce saturated fat. Return for evaluation of progress.

## 2021-08-14 NOTE — Patient Instructions (Addendum)
Read labels 1 day per week look at nutrition facts labels for: Saturated Fat, Carbs, Sodium Continue to be mindful about sweet snacks Bring snacks to work, Manufacturing engineer to provide healthier options for free snacks brought in. Continue to choose the nut options. Use MyPlate for guidance for balanced eating, and creating meal plans

## 2021-08-16 ENCOUNTER — Telehealth: Payer: Self-pay

## 2021-08-16 ENCOUNTER — Encounter: Payer: Self-pay | Admitting: Emergency Medicine

## 2021-08-16 NOTE — Telephone Encounter (Signed)
Pt has called and stated she is having some palpitations and racing heart x2 days. She has stated she started taking levothyroxine (SYNTHROID) 75 MCG tablet x1 week ago and wants to know if she needs to have her TSH levels checked.  *Pt also stated she thinks is may also be from stress related to her work.  She would like a call back to know what to do. She denies sob, dizziness and lightheadedness.  Pt can be reached at [phone].

## 2021-08-16 NOTE — Telephone Encounter (Signed)
Called and spoke with pt made pt OV to discuss palpitations.

## 2021-08-16 NOTE — Telephone Encounter (Signed)
Need to see her in the office please.  Call her and let me know when can she come in.

## 2021-08-17 ENCOUNTER — Telehealth: Payer: Self-pay

## 2021-08-17 NOTE — Telephone Encounter (Signed)
New message ----website Surescripts   Outcome Statements  Prescription within prescribing limits. Prior Authorization not required for PATIENT/MEDICATION. Used when there are coverage rules in place, but the prescriber is not going outside of them  Notes Member has an active PA on file which is expiring on 09/28/2021 and has 8 no. of fills remaining.

## 2021-08-23 ENCOUNTER — Ambulatory Visit: Payer: 59 | Admitting: Emergency Medicine

## 2021-08-24 ENCOUNTER — Other Ambulatory Visit (HOSPITAL_COMMUNITY): Payer: Self-pay

## 2021-08-24 DIAGNOSIS — K581 Irritable bowel syndrome with constipation: Secondary | ICD-10-CM | POA: Diagnosis not present

## 2021-08-24 DIAGNOSIS — K219 Gastro-esophageal reflux disease without esophagitis: Secondary | ICD-10-CM | POA: Diagnosis not present

## 2021-08-24 MED ORDER — DICYCLOMINE HCL 10 MG PO CAPS
10.0000 mg | ORAL_CAPSULE | Freq: Three times a day (TID) | ORAL | 3 refills | Status: DC | PRN
  Filled 2022-03-05: qty 90, 30d supply, fill #0

## 2021-08-24 MED ORDER — PANTOPRAZOLE SODIUM 20 MG PO TBEC
20.0000 mg | DELAYED_RELEASE_TABLET | Freq: Two times a day (BID) | ORAL | 3 refills | Status: DC
  Filled 2021-08-24: qty 180, 90d supply, fill #0
  Filled 2021-12-29: qty 180, 90d supply, fill #1
  Filled 2022-04-27: qty 180, 90d supply, fill #2

## 2021-08-31 ENCOUNTER — Telehealth: Payer: Self-pay

## 2021-08-31 NOTE — Telephone Encounter (Signed)
F/u  [name] Key: [id number] - PA Case ID: [id number] - Rx #: [id number] Need help? Call us at [phone] Outcome Approvedtoday The request has been approved. The authorization is effective for a maximum of 12 fills from 08/31/2021 to 08/30/2022, as long as the member is enrolled in their current health plan. The request was approved with a quantity restriction. This has been approved for a quantity limit of 16 with a day supply limit of 30. A written notification letter will follow with additional details. Drug Ubrelvy 50MG  tablets Form MedImpact ePA Form 2017 NCPDP

## 2021-08-31 NOTE — Telephone Encounter (Signed)
New message   Your information has been sent to [place].  [name] Key: [id number] - PA Case ID: [id number] - Rx #: [id number] Need help? Call us at [phone] Status Sent to [place] 50MG  tablets Form MedImpact ePA Form 2017 NCPDP

## 2021-09-07 ENCOUNTER — Other Ambulatory Visit: Payer: Self-pay

## 2021-09-07 ENCOUNTER — Ambulatory Visit: Payer: 59 | Admitting: Emergency Medicine

## 2021-09-07 ENCOUNTER — Encounter: Payer: Self-pay | Admitting: Emergency Medicine

## 2021-09-07 VITALS — BP 116/78 | HR 90 | Temp 98.5°F | Ht 60.0 in | Wt 127.0 lb

## 2021-09-07 DIAGNOSIS — E039 Hypothyroidism, unspecified: Secondary | ICD-10-CM | POA: Diagnosis not present

## 2021-09-07 DIAGNOSIS — E785 Hyperlipidemia, unspecified: Secondary | ICD-10-CM | POA: Diagnosis not present

## 2021-09-07 DIAGNOSIS — R7303 Prediabetes: Secondary | ICD-10-CM | POA: Diagnosis not present

## 2021-09-07 DIAGNOSIS — G43019 Migraine without aura, intractable, without status migrainosus: Secondary | ICD-10-CM

## 2021-09-07 DIAGNOSIS — G4733 Obstructive sleep apnea (adult) (pediatric): Secondary | ICD-10-CM | POA: Insufficient documentation

## 2021-09-07 DIAGNOSIS — M79605 Pain in left leg: Secondary | ICD-10-CM | POA: Insufficient documentation

## 2021-09-07 DIAGNOSIS — M25562 Pain in left knee: Secondary | ICD-10-CM | POA: Insufficient documentation

## 2021-09-07 NOTE — Progress Notes (Signed)
Subjective:    CC: L knee pain  I, Molly Weber, LAT, ATC, am serving as scribe for Dr. Lynne Leader.  HPI: Pt is a 55 y/o female presenting w/ L knee pain x approximately 3 weeks w/ no known MOI.  She locates her pain to the anterior aspect of the L knee. 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  Pain is primarily on the anterior aspect of the knee.  She notes that she does a lot of kneeling when she prays every day.  This is not particularly bothersome to her.  L knee swelling: yes- slight L knee mechanical symptoms: yes- "catching" sensation Aggravating factors: stairs,  Treatments tried: Rubbing alcohol, heating pad  Pertinent review of Systems: No fevers or chills  Relevant historical information: Sleep apnea.  Prediabetes.  Positive ANA.   Objective:    Vitals:   09/08/21 0919  BP: 104/76  Pulse: 92  SpO2: 99%   General: Well Developed, well nourished, and in no acute distress.   MSK: Left knee normal-appearing Normal motion. Tender palpation anterior knee overlying patellar tendon.  Palpable squeak overlying patellar tendon. Stable ligamentous exam. Negative Murray's test. Pain with resisted knee extension strength is intact.  Lab and Radiology Results  Diagnostic Limited MSK Ultrasound of: Left knee  Tendon normal-appearing Small joint effusion present superior patellar space. Patellar tendon no significant abnormalities visible.  No significant prepatellar bursitis visible. Medial and lateral joint lines are largely normal. Posterior knee no Baker's cyst. Impression: Mild joint effusion.  No structural abnormality seen overlying patellar tendon or within patellar tendon itself.  X-ray images left knee obtained today personally and independently interpreted No significant DJD.  No acute fracture. Await formal radiology review  Impression and Recommendations:    Assessment and Plan: 55 y.o.  female with anterior knee pain.  Clinically symptoms are consistent with patellar tendinitis or very mild prepatellar bursitis.  She does not have significant abnormalities visible on ultrasound of this region which is possible even with mild patellar tendinitis or prepatellar bursitis.  We will treat with home exercise program specific for patellar tendinitis or prepatellar bursitis as that should be helpful for a variety of potential diagnoses that can produce anterior knee pain including patellofemoral pain syndrome..  Additionally recommend Voltaren gel topically.  If not improving in a few weeks she will let me know and neck step should be referral to physical therapy. I discussed potential steroid injection.  We both think it should be used in the future if not improved with conservative management strategy.  Check back in 6 weeks if not improved.  PDMP not reviewed this encounter. Orders Placed This Encounter  Procedures   [place] LIMITED JOINT SPACE STRUCTURES LOW LEFT(NO LINKED CHARGES)    Standing Status:   Future    Number of Occurrences:   1    Standing Expiration Date:   03/09/2022    Order Specific Question:   Reason for Exam (SYMPTOM  OR DIAGNOSIS REQUIRED)    Answer:   left knee pain    Order Specific Question:   Preferred imaging location?    Answer:   [facility]   DG Knee AP/LAT W/[place] Left    Standing Status:   Future    Number of Occurrences:   1    Standing Expiration Date:   09/08/2022    Order Specific Question:   Reason for Exam (SYMPTOM  OR DIAGNOSIS REQUIRED)    Answer:   left knee pain    Order Specific Question:   Preferred imaging location?    Answer:   [facility]    Order Specific Question:   Is patient pregnant?    Answer:   No   No orders of the defined types were placed in this encounter.   Discussed warning signs or symptoms. Please see discharge instructions. Patient expresses understanding.   The above documentation  has been reviewed and is accurate and complete Lynne Leader, M.D.

## 2021-09-07 NOTE — Assessment & Plan Note (Signed)
Clinically euthyroid.  Continue Synthroid 75 mcg daily. 

## 2021-09-07 NOTE — Assessment & Plan Note (Addendum)
Stable.  Diet and nutrition discussed.  Intolerant to statin medication. The 10-year ASCVD risk score (Arnett DK, et al., 2019) is: 2.6%   Values used to calculate the score:     Age: 55 years     Sex: Female     Is Non-Hispanic African American: Yes     Diabetic: No     Tobacco smoker: No     Systolic Blood Pressure: 211 mmHg     Is BP treated: No     HDL Cholesterol: 69.1 mg/dL     Total Cholesterol: 253 mg/dL

## 2021-09-07 NOTE — Assessment & Plan Note (Signed)
Referral to sports medicine placed today.  Stable.

## 2021-09-07 NOTE — Assessment & Plan Note (Signed)
Diet and nutrition discussed.  Stable.

## 2021-09-07 NOTE — Patient Instructions (Signed)

## 2021-09-07 NOTE — Progress Notes (Signed)
Chelsey Hunt 55 y.o.   Chief Complaint  Patient presents with   Follow-up    6 month f/ u, thyroid and c    HISTORY OF PRESENT ILLNESS: This is a 55 y.o. female here for 60-month follow-up  Past medical history includes migraines, GERD, IBS C and hypothyroidism. Works night shifts.  Has chronic sleeping problems. Recently diagnosed with moderate obstructive sleep apnea.  Has not started CPAP treatment yet as recommended. Up-to-date with mammogram and colonoscopy. Non-smoker and no EtOH user. Blood work showed positive ANA.  Evaluated by rheumatologist recently.  Lupus ruled out. 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 There is no evidence of damage or change coming from an inflammation cause. Lab tests are all negative for specific antibody markers associated with the ANA test. I do not recommend starting new medications at this time. We can follow up in about a year for her abnormal lab test to recheck if these progress unless need sooner for something such as changes with joint swelling or inflammation. A1c at prediabetic level. Today complaining of 3-week history of left knee pain.  Worse when she goes downstairs.  Feels like left knee "catches".  HPI   Prior to Admission medications   Medication Sig Start Date End Date Taking? Authorizing Provider  acetaminophen (TYLENOL) 325 MG tablet Take 650 mg by mouth every 6 (six) hours as needed for mild pain.   Yes [provider]  aspirin EC 81 MG tablet Take 81 mg by mouth daily. Swallow whole.   Yes [provider]  co-enzyme Q-10 30 MG capsule Take 200 mg by mouth 3 (three) times daily.   Yes [provider]  cyclobenzaprine (FLEXERIL) 5 MG tablet Take 1 tablet (5 mg total) by mouth 3 (three) times daily as needed for muscle spasms. 03/28/21  Yes Jaffe, Adam R, DO  dicyclomine (BENTYL) 10 MG capsule Take 1 capsule (10 mg total)  by mouth 3 (three) times daily as needed. 08/24/21  Yes   ibuprofen (ADVIL,MOTRIN) 200 MG tablet Take 200 mg by mouth every 8 (eight) hours as needed.    Yes [provider]  levothyroxine (SYNTHROID) 75 MCG tablet Take 1 tablet (75 mcg total) by mouth daily. 08/02/21  Yes Ammar Moffatt, Ines Bloomer, MD  omeprazole (PRILOSEC) 40 MG capsule Take 1 capsule (40 mg total) by mouth 2 (two) times daily. 01/19/21  Yes   pantoprazole (PROTONIX) 20 MG tablet Take 1 tablet (20 mg total) by mouth 2 (two) times daily before a meal Take 30-45 mins prior to breakfast and dinner. 08/24/21  Yes   promethazine (PHENERGAN) 25 MG tablet Take 1 tablet (25 mg total) by mouth every 6 (six) hours as needed for nausea or vomiting. 03/28/21  Yes Jaffe, Adam R, DO  Ubrogepant (UBRELVY) 100 MG TABS Take 100 mg by mouth as needed.   Yes [provider]  Ubrogepant (UBRELVY) 100 MG TABS TAKE 1 TABLET BY MOUTH AS NEEDED (MAY REPEAT IN 2 HOURS. MAXIMUM 2 TABLETS IN 24 HOURS.). 08/02/21  Yes Jaffe, Adam R, DO  dicyclomine (BENTYL) 10 MG capsule Take 10 mg by mouth. 05/23/16 05/23/17  [provider]    Allergies  Allergen Reactions   Imitrex [Sumatriptan] Palpitations   Reglan [Metoclopramide] Palpitations   Propoxyphene N-Acetaminophen    Nabumetone Rash    REACTION: GI upset   Topiramate Rash    REACTION: rash    Patient Active  Problem List   Diagnosis Date Noted   Hyperlipidemia 08/14/2021   Positive ANA (antinuclear antibody) 04/20/2021   Finger deformity 04/20/2021   Raynaud phenomenon 09/17/2019   Hand cramps 09/17/2019   Anxiety 07/30/2019   Rash and nonspecific skin eruption 07/01/2019   Musculoskeletal strain 03/25/2019   Visit for screening mammogram 03/04/2019   Chronic idiopathic constipation 01/08/2019   Psychophysiological insomnia 07/07/2018   Seasonal allergic rhinitis due to pollen 08/08/2017   Intractable migraine without aura and without status migrainosus 07/02/2016    Hyperglycemia 11/28/2015   Pancreas divisum of native pancreas 12/27/2014   Uterine leiomyoma 09/26/2012   Routine general medical examination at a health care facility 07/30/2012   GERD (gastroesophageal reflux disease)    Hypothyroidism 08/12/2009    Past Medical History:  Diagnosis Date   ACID REFLUX DISEASE 01/03/2008   Anemia    ANEMIA, IRON DEFICIENCY 12/23/2008   history   Anxiety    Chronic tension type headache 01/13/2009   migraines   Complication of anesthesia    pt states she just needs a small amount or she will sleep too long   Dysplasia of cervix, low grade (CIN 1)    Fibroid    GERD (gastroesophageal reflux disease)    HEART MURMUR, SYSTOLIC 63/09/6008   will have echo 06/18/2013   HYPOTHYROIDISM, POST-RADIATION 08/12/2009   Irritable bowel syndrome 12/23/2008   Migraine    Ovarian cyst    Ovarian cyst    THYROID NODULE, RIGHT 04/21/2009    Past Surgical History:  Procedure Laterality Date   ABDOMINAL HYSTERECTOMY     COLONOSCOPY     HYSTEROSCOPY     LAPAROSCOPY     NOVASURE ABLATION     POLYPECTOMY     TUBAL LIGATION     UPPER GI ENDOSCOPY     VAGINAL HYSTERECTOMY N/A 06/24/2013   Procedure: TOTAL HYSTERECTOMY VAGINAL;  Surgeon: Alwyn Pea, MD;  Location: [facility];  Service: Gynecology;  Laterality: N/A;    Social History   Socioeconomic History   Marital status: Married    Spouse name: Shanon Brow   Number of children: 3   Years of education: Not on file   Highest education level: Associate degree: occupational, Hotel manager, or vocational program  Occupational History   Occupation: WOMENS    Employer: [employer]  Tobacco Use   Smoking status: Never   Smokeless tobacco: Never  Vaping Use   Vaping Use: Never used  Substance and Sexual Activity   Alcohol use: No   Drug use: No   Sexual activity: Yes    Birth control/protection: Surgical    Comment: BTL  Other Topics Concern   Not on file  Social History Narrative   Regular exercise-yes       Patient is right-handed.She lives with her husband in a 1 story home. She has recently been avoiding caffeine. Walks.   Social Determinants of Radio broadcast assistant Strain: Not on file  Food Insecurity: No Food Insecurity   Worried About Charity fundraiser in the Last Year: Never true   Ran Out of Food in the Last Year: Never true  Transportation Needs: Not on file  Physical Activity: Not on file  Stress: Not on file  Social Connections: Not on file  Intimate Partner Violence: Not on file    Family History  Problem Relation Age of Onset   Heart disease Mother    Thyroid disease Mother    Heart attack Mother  Cancer Father    Heart disease Father    Heart attack Father    Throat cancer Father    Lung cancer Father    Alcoholism Father    Graves' disease Sister    Asthma Sister    Hepatitis Sister    Stomach cancer Sister    CVA Brother    Alzheimer's disease Brother    Goiter Brother    Hypertension Brother    Hypertension Brother    Alcoholism Brother    Hypertension Paternal Grandfather    Healthy Daughter    Goiter Other        Siblings 2   GER disease Son    GER disease Son    Depression Neg Hx    Alcohol abuse Neg Hx    Drug abuse Neg Hx    Early death Neg Hx    Hearing loss Neg Hx    Kidney disease Neg Hx    Stroke Neg Hx      Review of Systems  Constitutional: Negative.  Negative for chills and fever.  HENT: Negative.  Negative for congestion and sore throat.   Eyes: Negative.   Respiratory: Negative.  Negative for cough and shortness of breath.   Cardiovascular: Negative.  Negative for chest pain and palpitations.  Gastrointestinal: Negative.  Negative for abdominal pain, diarrhea, nausea and vomiting.  Genitourinary: Negative.  Negative for dysuria and hematuria.  Musculoskeletal:  Positive for joint pain (Left knee pain).  Skin: Negative.  Negative for rash.  Neurological:  Negative for dizziness and headaches.  All other systems  reviewed and are negative.  Today's Vitals   09/07/21 0822  BP: 116/78  Pulse: 90  Temp: 98.5 F (36.9 C)  TempSrc: Oral  SpO2: 97%  Weight: 127 lb (57.6 kg)  Height: 5' (1.524 m)   Body mass index is 24.8 kg/m. Wt Readings from Last 3 Encounters:  09/07/21 127 lb (57.6 kg)  06/29/21 130 lb 12.8 oz (59.3 kg)  05/09/21 123 lb (55.8 kg)    Physical Exam Vitals reviewed.  Constitutional:      Appearance: Normal appearance.  HENT:     Head: Normocephalic.  Eyes:     Extraocular Movements: Extraocular movements intact.     Conjunctiva/sclera: Conjunctivae normal.     Pupils: Pupils are equal, round, and reactive to light.  Cardiovascular:     Rate and Rhythm: Normal rate and regular rhythm.     Pulses: Normal pulses.     Heart sounds: Normal heart sounds.  Pulmonary:     Effort: Pulmonary effort is normal.     Breath sounds: Normal breath sounds.  Abdominal:     General: There is no distension.     Palpations: Abdomen is soft.     Tenderness: There is no abdominal tenderness.  Musculoskeletal:     Cervical back: Normal range of motion and neck supple.     Comments: Left knee: No swelling or tenderness.  Full range of motion.  Skin:    General: Skin is warm and dry.     Capillary Refill: Capillary refill takes less than 2 seconds.  Neurological:     General: No focal deficit present.     Mental Status: She is alert and oriented to person, place, and time.  Psychiatric:        Mood and Affect: Mood normal.        Behavior: Behavior normal.     ASSESSMENT & PLAN: Problem List Items Addressed This Visit  Cardiovascular and Mediastinum   Intractable migraine without aura and without status migrainosus    Stable.  On medication.  Lorrin Goodell as needed.        Respiratory   Obstructive sleep apnea    Recently diagnosed and recommended to start CPAP treatment.        Endocrine   Hypothyroidism - Primary    Clinically euthyroid. Continue Synthroid  75 mcg daily.        Other   Dyslipidemia    Stable.  Diet and nutrition discussed.  Intolerant to statin medication. The 10-year ASCVD risk score (Arnett DK, et al., 2019) is: 2.6%   Values used to calculate the score:     Age: 63 years     Sex: Female     Is Non-Hispanic African American: Yes     Diabetic: No     Tobacco smoker: No     Systolic Blood Pressure: 756 mmHg     Is BP treated: No     HDL Cholesterol: 69.1 mg/dL     Total Cholesterol: 253 mg/dL       Left knee pain    Referral to sports medicine placed today.  Stable.      Relevant Orders   Ambulatory referral to Sports Medicine   Prediabetes    Diet and nutrition discussed.  Stable.      Patient Instructions  Health Maintenance, Female Adopting a healthy lifestyle and getting preventive care are important in promoting health and wellness. Ask your health care provider about: The right schedule for you to have regular tests and exams. Things you can do on your own to prevent diseases and keep yourself healthy. What should I know about diet, weight, and exercise? Eat a healthy diet  Eat a diet that includes plenty of vegetables, fruits, low-fat dairy products, and lean protein. Do not eat a lot of foods that are high in solid fats, added sugars, or sodium. Maintain a healthy weight Body mass index (BMI) is used to identify weight problems. It estimates body fat based on height and weight. Your health care provider can help determine your BMI and help you achieve or maintain a healthy weight. Get regular exercise Get regular exercise. This is one of the most important things you can do for your health. Most adults should: Exercise for at least 150 minutes each week. The exercise should increase your heart rate and make you sweat (moderate-intensity exercise). Do strengthening exercises at least twice a week. This is in addition to the moderate-intensity exercise. Spend less time sitting. Even light physical  activity can be beneficial. Watch cholesterol and blood lipids Have your blood tested for lipids and cholesterol at 56 years of age, then have this test every 5 years. Have your cholesterol levels checked more often if: Your lipid or cholesterol levels are high. You are older than 56 years of age. You are at high risk for heart disease. What should I know about cancer screening? Depending on your health history and family history, you may need to have cancer screening at various ages. This may include screening for: Breast cancer. Cervical cancer. Colorectal cancer. Skin cancer. Lung cancer. What should I know about heart disease, diabetes, and high blood pressure? Blood pressure and heart disease High blood pressure causes heart disease and increases the risk of stroke. This is more likely to develop in people who have high blood pressure readings or are overweight. Have your blood pressure checked: Every 3-5 years if you  are 77-46 years of age. Every year if you are 19 years old or older. Diabetes Have regular diabetes screenings. This checks your fasting blood sugar level. Have the screening done: Once every three years after age 29 if you are at a normal weight and have a low risk for diabetes. More often and at a younger age if you are overweight or have a high risk for diabetes. What should I know about preventing infection? Hepatitis B If you have a higher risk for hepatitis B, you should be screened for this virus. Talk with your health care provider to find out if you are at risk for hepatitis B infection. Hepatitis C Testing is recommended for: Everyone born from [birth year] through 1966. Anyone with known risk factors for hepatitis C. Sexually transmitted infections (STIs) Get screened for STIs, including gonorrhea and chlamydia, if: You are sexually active and are younger than 56 years of age. You are older than 56 years of age and your health care provider tells you that you  are at risk for this type of infection. Your sexual activity has changed since you were last screened, and you are at increased risk for chlamydia or gonorrhea. Ask your health care provider if you are at risk. Ask your health care provider about whether you are at high risk for HIV. Your health care provider may recommend a prescription medicine to help prevent HIV infection. If you choose to take medicine to prevent HIV, you should first get tested for HIV. You should then be tested every 3 months for as long as you are taking the medicine. Pregnancy If you are about to stop having your period (premenopausal) and you may become pregnant, seek counseling before you get pregnant. Take 400 to 800 micrograms (mcg) of folic acid every day if you become pregnant. Ask for birth control (contraception) if you want to prevent pregnancy. Osteoporosis and menopause Osteoporosis is a disease in which the bones lose minerals and strength with aging. This can result in bone fractures. If you are 34 years old or older, or if you are at risk for osteoporosis and fractures, ask your health care provider if you should: Be screened for bone loss. Take a calcium or vitamin D supplement to lower your risk of fractures. Be given hormone replacement therapy (HRT) to treat symptoms of menopause. Follow these instructions at home: Alcohol use Do not drink alcohol if: Your health care provider tells you not to drink. You are pregnant, may be pregnant, or are planning to become pregnant. If you drink alcohol: Limit how much you have to: 0-1 drink a day. Know how much alcohol is in your drink. In the [place] one drink equals one 12 oz bottle of beer (355 mL), one 5 oz glass of wine (148 mL), or one 1 oz glass of hard liquor (44 mL). Lifestyle Do not use any products that contain nicotine or tobacco. These products include cigarettes, chewing tobacco, and vaping devices, such as e-cigarettes. If you need help quitting,  ask your health care provider. Do not use street drugs. Do not share needles. Ask your health care provider for help if you need support or information about quitting drugs. General instructions Schedule regular health, dental, and eye exams. Stay current with your vaccines. Tell your health care provider if: You often feel depressed. You have ever been abused or do not feel safe at home. Summary Adopting a healthy lifestyle and getting preventive care are important in promoting health and wellness. Follow  your health care provider's instructions about healthy diet, exercising, and getting tested or screened for diseases. Follow your health care provider's instructions on monitoring your cholesterol and blood pressure. This information is not intended to replace advice given to you by your health care provider. Make sure you discuss any questions you have with your health care provider. Document Revised: 01/16/2021 Document Reviewed: 01/16/2021 Elsevier Patient Education  2022 [place], MD [facility]

## 2021-09-07 NOTE — Assessment & Plan Note (Signed)
Recently diagnosed and recommended to start CPAP treatment.

## 2021-09-07 NOTE — Assessment & Plan Note (Addendum)
Stable.  On medication.  Chelsey Hunt as needed.

## 2021-09-08 ENCOUNTER — Ambulatory Visit (INDEPENDENT_AMBULATORY_CARE_PROVIDER_SITE_OTHER): Payer: 59

## 2021-09-08 ENCOUNTER — Ambulatory Visit: Payer: 59 | Admitting: Family Medicine

## 2021-09-08 ENCOUNTER — Ambulatory Visit: Payer: Self-pay

## 2021-09-08 VITALS — BP 104/76 | HR 92 | Ht 60.0 in | Wt 129.0 lb

## 2021-09-08 DIAGNOSIS — M25562 Pain in left knee: Secondary | ICD-10-CM

## 2021-09-08 NOTE — Patient Instructions (Addendum)
Thank you for coming in today.   Please get an Xray today before you leave   Please use Voltaren gel (Generic Diclofenac Gel) up to 4x daily for pain as needed.  This is available over-the-counter as both the name brand Voltaren gel and the generic diclofenac gel.   Please complete the exercises that the athletic trainer went over with you:  View at [email or web address] using code: [email or web address]  Recheck back in 6-8 weeks.  If not improving, please let know if you are not improving and I can refer you to physical therapy

## 2021-09-12 NOTE — Progress Notes (Signed)
Left knee x-ray looks normal to radiology

## 2021-09-20 DIAGNOSIS — D2261 Melanocytic nevi of right upper limb, including shoulder: Secondary | ICD-10-CM | POA: Diagnosis not present

## 2021-09-20 DIAGNOSIS — D2371 Other benign neoplasm of skin of right lower limb, including hip: Secondary | ICD-10-CM | POA: Diagnosis not present

## 2021-09-20 DIAGNOSIS — L821 Other seborrheic keratosis: Secondary | ICD-10-CM | POA: Diagnosis not present

## 2021-09-20 DIAGNOSIS — L304 Erythema intertrigo: Secondary | ICD-10-CM | POA: Diagnosis not present

## 2021-09-20 DIAGNOSIS — L819 Disorder of pigmentation, unspecified: Secondary | ICD-10-CM | POA: Diagnosis not present

## 2021-09-20 DIAGNOSIS — D2262 Melanocytic nevi of left upper limb, including shoulder: Secondary | ICD-10-CM | POA: Diagnosis not present

## 2021-09-20 DIAGNOSIS — D225 Melanocytic nevi of trunk: Secondary | ICD-10-CM | POA: Diagnosis not present

## 2021-09-25 ENCOUNTER — Other Ambulatory Visit (HOSPITAL_COMMUNITY): Payer: Self-pay

## 2021-09-25 DIAGNOSIS — Z01419 Encounter for gynecological examination (general) (routine) without abnormal findings: Secondary | ICD-10-CM | POA: Diagnosis not present

## 2021-09-25 DIAGNOSIS — Z1211 Encounter for screening for malignant neoplasm of colon: Secondary | ICD-10-CM | POA: Diagnosis not present

## 2021-09-25 DIAGNOSIS — Z1231 Encounter for screening mammogram for malignant neoplasm of breast: Secondary | ICD-10-CM | POA: Diagnosis not present

## 2021-09-25 DIAGNOSIS — Z6825 Body mass index (BMI) 25.0-25.9, adult: Secondary | ICD-10-CM | POA: Diagnosis not present

## 2021-09-25 DIAGNOSIS — A609 Anogenital herpesviral infection, unspecified: Secondary | ICD-10-CM | POA: Diagnosis not present

## 2021-09-25 MED ORDER — VALACYCLOVIR HCL 1 G PO TABS
1000.0000 mg | ORAL_TABLET | Freq: Two times a day (BID) | ORAL | 11 refills | Status: DC
  Filled 2021-09-25: qty 30, 15d supply, fill #0
  Filled 2022-03-09: qty 30, 15d supply, fill #1

## 2021-10-02 ENCOUNTER — Ambulatory Visit: Payer: 59 | Admitting: Registered"

## 2021-10-11 ENCOUNTER — Encounter: Payer: Self-pay | Admitting: Internal Medicine

## 2021-10-11 NOTE — Progress Notes (Signed)
Subjective:    Patient ID: Chelsey Hunt, female    DOB: 15-May-1966, 56 y.o.   MRN: [id number]  This visit occurred during the SARS-CoV-2 public health emergency.  Safety protocols were in place, including screening questions prior to the visit, additional usage of staff PPE, and extensive cleaning of exam room while observing appropriate contact time as indicated for disinfecting solutions.    HPI The patient is here for an acute visit.   Extreme fatigue - it started last week.  She was just really tired.  Saturday to Sunday morning she just slept for hours - she slept most of the day.  She is better now, but still tired.  She denies other symptoms while she was very fatigued including cold symptoms or fevers.      Left shoulder/chest pain -  2 episodes.  the other day she had some left sided chest pain.  She thought it was gas.  She does have a lot of heartburn.  She did have some pinching sensation in her chest which lasted hours and was intermittent.     She did have some neck pain.  Right shoulder aches at times.  Cramps in fingers sometimes.   GERD  - taking pantoprazole 20 mg bid.  She has reflux daily.  She has frequent belching.      Medications and allergies reviewed with patient and updated if appropriate.  Patient Active Problem List   Diagnosis Date Noted   Left knee pain 09/07/2021   Obstructive sleep apnea 09/07/2021   Prediabetes 09/07/2021   Dyslipidemia 08/14/2021   Positive ANA (antinuclear antibody) 04/20/2021   Finger deformity 04/20/2021   Raynaud phenomenon 09/17/2019   Anxiety 07/30/2019   Chronic idiopathic constipation 01/08/2019   Psychophysiological insomnia 07/07/2018   Seasonal allergic rhinitis due to pollen 08/08/2017   Intractable migraine without aura and without status migrainosus 07/02/2016   Pancreas divisum of native pancreas 12/27/2014   Uterine leiomyoma 09/26/2012   GERD (gastroesophageal reflux disease)    Hypothyroidism  08/12/2009    Current Outpatient Medications on File Prior to Visit  Medication Sig Dispense Refill   acetaminophen (TYLENOL) 325 MG tablet Take 650 mg by mouth every 6 (six) hours as needed for mild pain.     aspirin EC 81 MG tablet Take 81 mg by mouth daily. Swallow whole.     co-enzyme Q-10 30 MG capsule Take 200 mg by mouth 3 (three) times daily.     cyclobenzaprine (FLEXERIL) 5 MG tablet Take 1 tablet (5 mg total) by mouth 3 (three) times daily as needed for muscle spasms. 30 tablet 5   dicyclomine (BENTYL) 10 MG capsule Take 1 capsule (10 mg total) by mouth 3 (three) times daily as needed. 90 capsule 3   ibuprofen (ADVIL,MOTRIN) 200 MG tablet Take 200 mg by mouth every 8 (eight) hours as needed.      levothyroxine (SYNTHROID) 75 MCG tablet Take 1 tablet (75 mcg total) by mouth daily. 90 tablet 3   omeprazole (PRILOSEC) 40 MG capsule Take 1 capsule (40 mg total) by mouth 2 (two) times daily. 180 capsule 1   pantoprazole (PROTONIX) 20 MG tablet Take 1 tablet (20 mg total) by mouth 2 (two) times daily before a meal Take 30-45 mins prior to breakfast and dinner. 180 tablet 3   promethazine (PHENERGAN) 25 MG tablet Take 1 tablet (25 mg total) by mouth every 6 (six) hours as needed for nausea or vomiting. 30 tablet 5   Ubrogepant (  UBRELVY) 100 MG TABS Take 100 mg by mouth as needed.     Ubrogepant (UBRELVY) 100 MG TABS TAKE 1 TABLET BY MOUTH AS NEEDED (MAY REPEAT IN 2 HOURS. MAXIMUM 2 TABLETS IN 24 HOURS.). 16 tablet 3   valACYclovir (VALTREX) 1000 MG tablet Take 1 tablet (1,000 mg total) by mouth every 12 (twelve) hours for 5 days 30 tablet 11   dicyclomine (BENTYL) 10 MG capsule Take 10 mg by mouth.     No current facility-administered medications on file prior to visit.    Past Medical History:  Diagnosis Date   ACID REFLUX DISEASE 01/03/2008   Anemia    ANEMIA, IRON DEFICIENCY 12/23/2008   history   Anxiety    Chronic tension type headache 01/13/2009   migraines   Complication of  anesthesia    pt states she just needs a small amount or she will sleep too long   Dysplasia of cervix, low grade (CIN 1)    Fibroid    GERD (gastroesophageal reflux disease)    HEART MURMUR, SYSTOLIC 03/47/4259   will have echo 06/18/2013   HYPOTHYROIDISM, POST-RADIATION 08/12/2009   Irritable bowel syndrome 12/23/2008   Migraine    Ovarian cyst    Ovarian cyst    THYROID NODULE, RIGHT 04/21/2009    Past Surgical History:  Procedure Laterality Date   ABDOMINAL HYSTERECTOMY     COLONOSCOPY     HYSTEROSCOPY     LAPAROSCOPY     NOVASURE ABLATION     POLYPECTOMY     TUBAL LIGATION     UPPER GI ENDOSCOPY     VAGINAL HYSTERECTOMY N/A 06/24/2013   Procedure: TOTAL HYSTERECTOMY VAGINAL;  Surgeon: Alwyn Pea, MD;  Location: [facility];  Service: Gynecology;  Laterality: N/A;    Social History   Socioeconomic History   Marital status: Married    Spouse name: Chelsey Hunt   Number of children: 3   Years of education: Not on file   Highest education level: Associate degree: occupational, Hotel manager, or vocational program  Occupational History   Occupation: WOMENS    Employer: [employer]  Tobacco Use   Smoking status: Never   Smokeless tobacco: Never  Vaping Use   Vaping Use: Never used  Substance and Sexual Activity   Alcohol use: No   Drug use: No   Sexual activity: Yes    Birth control/protection: Surgical    Comment: BTL  Other Topics Concern   Not on file  Social History Narrative   Regular exercise-yes      Patient is right-handed.She lives with her husband in a 1 story home. She has recently been avoiding caffeine. Walks.   Social Determinants of Radio broadcast assistant Strain: Not on file  Food Insecurity: No Food Insecurity   Worried About Charity fundraiser in the Last Year: Never true   Ran Out of Food in the Last Year: Never true  Transportation Needs: Not on file  Physical Activity: Not on file  Stress: Not on file  Social Connections: Not on file     Family History  Problem Relation Age of Onset   Heart disease Mother    Thyroid disease Mother    Heart attack Mother    Cancer Father    Heart disease Father    Heart attack Father    Throat cancer Father    Lung cancer Father    Alcoholism Father    Chelsey Primas' disease Sister    Asthma Sister  Hepatitis Sister    Stomach cancer Sister    CVA Brother    Alzheimer's disease Brother    Goiter Brother    Hypertension Brother    Hypertension Brother    Alcoholism Brother    Hypertension Paternal Grandfather    Healthy Daughter    Goiter Other        Siblings 2   GER disease Son    GER disease Son    Depression Neg Hx    Alcohol abuse Neg Hx    Drug abuse Neg Hx    Early death Neg Hx    Hearing loss Neg Hx    Kidney disease Neg Hx    Stroke Neg Hx     Review of Systems  Constitutional:  Positive for fatigue. Negative for chills and fever.  HENT:  Positive for congestion (mild, chronic). Negative for ear pain, sinus pain and sore throat.   Gastrointestinal:  Positive for constipation (recently - improved) and nausea (with headache).  Endocrine: Positive for cold intolerance.  Musculoskeletal:  Positive for arthralgias.  Neurological:  Positive for dizziness (a little saturday) and headaches (chronic migraines.   had headache saturday in left front region).      Objective:   Vitals:   10/12/21 0820  BP: 116/74  Pulse: 90  Temp: 97.9 F (36.6 C)  SpO2: 99%   BP Readings from Last 3 Encounters:  10/12/21 116/74  09/08/21 104/76  09/07/21 116/78   Wt Readings from Last 3 Encounters:  10/12/21 127 lb 12.8 oz (58 kg)  09/08/21 129 lb (58.5 kg)  09/07/21 127 lb (57.6 kg)   Body mass index is 24.96 kg/m.   Physical Exam Constitutional:      General: She is not in acute distress.    Appearance: Normal appearance. She is not ill-appearing.  HENT:     Head: Normocephalic and atraumatic.  Cardiovascular:     Rate and Rhythm: Normal rate and regular  rhythm.     Heart sounds: No murmur heard. Pulmonary:     Effort: Pulmonary effort is normal. No respiratory distress.     Breath sounds: No wheezing or rales.  Abdominal:     General: There is no distension.     Palpations: Abdomen is soft.     Tenderness: There is no abdominal tenderness.  Musculoskeletal:        General: No swelling, tenderness (No tenderness with palpation posterior neck, bilateral shoulders, upper back and arms) or deformity. Normal range of motion.     Cervical back: Neck supple. No tenderness.     Right lower leg: No edema.     Left lower leg: No edema.  Lymphadenopathy:     Cervical: No cervical adenopathy.  Skin:    General: Skin is warm and dry.  Neurological:     Mental Status: She is alert and oriented to person, place, and time.     Sensory: No sensory deficit.     Motor: No weakness.  Psychiatric:        Mood and Affect: Mood normal.           Assessment & Plan:    Fatigue: Acute States fatigue that was extreme last week, but is now better She did sleep several hours more than usual and that seemed to help No concerning symptoms while she was extremely fatigued Denies any obvious cause such as not getting good sleep for several days, unusual activities or cold symptoms She was concerned about her thyroid  if that was potentially causing it, which seems unlikely since her fatigue is better We will go ahead and check CBC, CMP and TSH Discussed importance of good sleep, hydration If her fatigue does not continue to improve or worsens again she will let us know  GERD: Chronic Not controlled-she states she has reflux and belching on a daily basis Continue pantoprazole 20 mg twice daily Start famotidine 40 mg daily Last EGD 2016 and it was normal Recommended that she follow-up with her gastroenterologist  Neck pain, left shoulder pain, right arm pain, pinching left chest pain: Acute All atypical and likely musculoskeletal in nature Low  risk for cardiac cause She did take ibuprofen at 1 point and it did help She currently does not have any pain Will hold off on further evaluation unless her symptoms return Discussed possible causes and think she can do to help with some of the musculoskeletal issues

## 2021-10-12 ENCOUNTER — Other Ambulatory Visit: Payer: Self-pay

## 2021-10-12 ENCOUNTER — Ambulatory Visit: Payer: 59 | Admitting: Internal Medicine

## 2021-10-12 ENCOUNTER — Other Ambulatory Visit (HOSPITAL_COMMUNITY): Payer: Self-pay

## 2021-10-12 VITALS — BP 116/74 | HR 90 | Temp 97.9°F | Ht 60.0 in | Wt 127.8 lb

## 2021-10-12 DIAGNOSIS — R5383 Other fatigue: Secondary | ICD-10-CM

## 2021-10-12 DIAGNOSIS — M542 Cervicalgia: Secondary | ICD-10-CM

## 2021-10-12 DIAGNOSIS — R0789 Other chest pain: Secondary | ICD-10-CM

## 2021-10-12 DIAGNOSIS — M79602 Pain in left arm: Secondary | ICD-10-CM

## 2021-10-12 DIAGNOSIS — M79601 Pain in right arm: Secondary | ICD-10-CM

## 2021-10-12 DIAGNOSIS — K2100 Gastro-esophageal reflux disease with esophagitis, without bleeding: Secondary | ICD-10-CM

## 2021-10-12 LAB — COMPREHENSIVE METABOLIC PANEL
ALT: 17 U/L (ref 0–35)
AST: 20 U/L (ref 0–37)
Albumin: 4.7 g/dL (ref 3.5–5.2)
Alkaline Phosphatase: 104 U/L (ref 39–117)
BUN: 15 mg/dL (ref 6–23)
CO2: 29 mEq/L (ref 19–32)
Calcium: 10.6 mg/dL — ABNORMAL HIGH (ref 8.4–10.5)
Chloride: 101 mEq/L (ref 96–112)
Creatinine, Ser: 0.99 mg/dL (ref 0.40–1.20)
GFR: 63.8 mL/min (ref >60.00)
Glucose, Bld: 93 mg/dL (ref 70–99)
Potassium: 4.1 mEq/L (ref 3.5–5.1)
Sodium: 140 mEq/L (ref 135–145)
Total Bilirubin: 0.4 mg/dL (ref 0.2–1.2)
Total Protein: 8.6 g/dL — ABNORMAL HIGH (ref 6.0–8.3)

## 2021-10-12 LAB — CBC WITH DIFFERENTIAL/PLATELET
Basophils Absolute: 0 10*3/uL (ref 0.0–0.1)
Basophils Relative: 0.5 % (ref 0.0–3.0)
Eosinophils Absolute: 0.2 10*3/uL (ref 0.0–0.7)
Eosinophils Relative: 4.2 % (ref 0.0–5.0)
HCT: 40.8 % (ref 36.0–46.0)
Hemoglobin: 13.4 g/dL (ref 12.0–15.0)
Lymphocytes Relative: 45.5 % (ref 12.0–46.0)
Lymphs Abs: 2.3 10*3/uL (ref 0.7–4.0)
MCHC: 32.9 g/dL (ref 30.0–36.0)
MCV: 92.3 fl (ref 78.0–100.0)
Monocytes Absolute: 0.3 10*3/uL (ref 0.1–1.0)
Monocytes Relative: 6.5 % (ref 3.0–12.0)
Neutro Abs: 2.2 10*3/uL (ref 1.4–7.7)
Neutrophils Relative %: 43.3 % (ref 43.0–77.0)
Platelets: 376 10*3/uL (ref 150.0–400.0)
RBC: 4.43 Mil/uL (ref 3.87–5.11)
RDW: 13.2 % (ref 11.5–15.5)
WBC: 5.1 10*3/uL (ref 4.0–10.5)

## 2021-10-12 LAB — TSH: TSH: 1.17 u[IU]/mL (ref 0.35–5.50)

## 2021-10-12 MED ORDER — FAMOTIDINE 40 MG PO TABS
40.0000 mg | ORAL_TABLET | Freq: Every day | ORAL | 1 refills | Status: DC
  Filled 2021-10-12: qty 90, 90d supply, fill #0

## 2021-10-12 NOTE — Patient Instructions (Signed)
° ° ° °  Blood work was ordered.     Medications changes include :   start pepcid 40 mg once a day  Your prescription(s) have been submitted to your pharmacy. Please take as directed and contact our office if you believe you are having problem(s) with the medication(s).    Call GI

## 2021-10-13 ENCOUNTER — Other Ambulatory Visit (HOSPITAL_COMMUNITY): Payer: Self-pay

## 2021-10-13 DIAGNOSIS — H52223 Regular astigmatism, bilateral: Secondary | ICD-10-CM | POA: Diagnosis not present

## 2021-10-16 ENCOUNTER — Telehealth: Payer: Self-pay | Admitting: Neurology

## 2021-10-16 DIAGNOSIS — Z0279 Encounter for issue of other medical certificate: Secondary | ICD-10-CM

## 2021-10-16 NOTE — Telephone Encounter (Signed)
Advised please allow a week to be filled out. Will call once it is ready.

## 2021-10-16 NOTE — Telephone Encounter (Signed)
Pt called in and left a message wanting to see the status of her FMLA paperwork from [employer].

## 2021-10-17 NOTE — Telephone Encounter (Signed)
Advised pt FMLA paperwork is ready. Will fax off today and mail a copy.

## 2021-10-23 ENCOUNTER — Ambulatory Visit: Payer: 59 | Admitting: Family Medicine

## 2021-11-02 ENCOUNTER — Other Ambulatory Visit (HOSPITAL_COMMUNITY): Payer: Self-pay

## 2021-11-15 ENCOUNTER — Ambulatory Visit: Payer: 59 | Admitting: Emergency Medicine

## 2021-11-15 ENCOUNTER — Other Ambulatory Visit: Payer: Self-pay

## 2021-11-15 ENCOUNTER — Encounter: Payer: Self-pay | Admitting: Emergency Medicine

## 2021-11-15 VITALS — BP 126/70 | HR 79 | Ht 60.0 in | Wt 129.0 lb

## 2021-11-15 DIAGNOSIS — E039 Hypothyroidism, unspecified: Secondary | ICD-10-CM | POA: Diagnosis not present

## 2021-11-15 DIAGNOSIS — Z1231 Encounter for screening mammogram for malignant neoplasm of breast: Secondary | ICD-10-CM | POA: Diagnosis not present

## 2021-11-15 DIAGNOSIS — R079 Chest pain, unspecified: Secondary | ICD-10-CM | POA: Diagnosis not present

## 2021-11-15 DIAGNOSIS — R002 Palpitations: Secondary | ICD-10-CM

## 2021-11-15 DIAGNOSIS — Z8249 Family history of ischemic heart disease and other diseases of the circulatory system: Secondary | ICD-10-CM

## 2021-11-15 NOTE — Progress Notes (Signed)
Chelsey Hunt 56 y.o.   Chief Complaint  Patient presents with   Palpitations    X 2 weeks, pt states she is more tired that ususal.    HISTORY OF PRESENT ILLNESS: This is a 56 y.o. female complaining of palpitations and right-sided chest discomfort with left-sided "pinching pain" on and off for the past couple of months.  Could be related to increased stress at work. Strong family history of coronary artery disease/ischemic heart disease.  Both parents died of heart attacks. Episodes not associated with syncope or lightheadedness but at times gets slightly short of breath. No other associated symptoms.  Palpitations  Associated symptoms include chest pain. Pertinent negatives include no coughing, dizziness, fever, nausea, shortness of breath or vomiting.    Prior to Admission medications   Medication Sig Start Date End Date Taking? Authorizing Provider  acetaminophen (TYLENOL) 325 MG tablet Take 650 mg by mouth every 6 (six) hours as needed for mild pain.   Yes [provider]  aspirin EC 81 MG tablet Take 81 mg by mouth daily. Swallow whole.   Yes [provider]  co-enzyme Q-10 30 MG capsule Take 200 mg by mouth 3 (three) times daily.   Yes [provider]  cyclobenzaprine (FLEXERIL) 5 MG tablet Take 1 tablet (5 mg total) by mouth 3 (three) times daily as needed for muscle spasms. 03/28/21  Yes Jaffe, Adam R, DO  dicyclomine (BENTYL) 10 MG capsule Take 10 mg by mouth. 05/23/16 11/15/21 Yes [provider]  dicyclomine (BENTYL) 10 MG capsule Take 1 capsule (10 mg total) by mouth 3 (three) times daily as needed. 08/24/21  Yes   famotidine (PEPCID) 40 MG tablet Take 1 tablet (40 mg total) by mouth daily. 10/12/21  Yes Burns, Claudina Lick, MD  ibuprofen (ADVIL,MOTRIN) 200 MG tablet Take 200 mg by mouth every 8 (eight) hours as needed.    Yes [provider]  levothyroxine (SYNTHROID) 75 MCG tablet Take 1 tablet (75 mcg total) by mouth daily.  08/02/21  Yes Cleta Heatley, Ines Bloomer, MD  pantoprazole (PROTONIX) 20 MG tablet Take 1 tablet (20 mg total) by mouth 2 (two) times daily before a meal Take 30-45 mins prior to breakfast and dinner. 08/24/21  Yes   Ubrogepant (UBRELVY) 100 MG TABS Take 100 mg by mouth as needed.   Yes [provider]  Ubrogepant (UBRELVY) 100 MG TABS TAKE 1 TABLET BY MOUTH AS NEEDED (MAY REPEAT IN 2 HOURS. MAXIMUM 2 TABLETS IN 24 HOURS.). 08/02/21  Yes Tomi Likens, Adam R, DO  valACYclovir (VALTREX) 1000 MG tablet Take 1 tablet (1,000 mg total) by mouth every 12 (twelve) hours for 5 days 09/25/21  Yes   promethazine (PHENERGAN) 25 MG tablet Take 1 tablet (25 mg total) by mouth every 6 (six) hours as needed for nausea or vomiting. Patient not taking: Reported on 11/15/2021 03/28/21   Pieter Partridge, DO    Allergies  Allergen Reactions   Imitrex [Sumatriptan] Palpitations   Reglan [Metoclopramide] Palpitations   Propoxyphene N-Acetaminophen    Nabumetone Rash    REACTION: GI upset   Topiramate Rash    REACTION: rash    Patient Active Problem List   Diagnosis Date Noted   Left knee pain 09/07/2021   Obstructive sleep apnea 09/07/2021   Prediabetes 09/07/2021   Dyslipidemia 08/14/2021   Positive ANA (antinuclear antibody) 04/20/2021   Finger deformity 04/20/2021   Raynaud phenomenon 09/17/2019   Anxiety 07/30/2019   Chronic idiopathic constipation 01/08/2019   Psychophysiological  insomnia 07/07/2018   Seasonal allergic rhinitis due to pollen 08/08/2017   Intractable migraine without aura and without status migrainosus 07/02/2016   Pancreas divisum of native pancreas 12/27/2014   Uterine leiomyoma 09/26/2012   GERD (gastroesophageal reflux disease)    Hypothyroidism 08/12/2009    Past Medical History:  Diagnosis Date   ACID REFLUX DISEASE 01/03/2008   Anemia    ANEMIA, IRON DEFICIENCY 12/23/2008   history   Anxiety    Chronic tension type headache 01/13/2009   migraines   Complication of anesthesia     pt states she just needs a small amount or she will sleep too long   Dysplasia of cervix, low grade (CIN 1)    Fibroid    GERD (gastroesophageal reflux disease)    HEART MURMUR, SYSTOLIC 00/76/2263   will have echo 06/18/2013   HYPOTHYROIDISM, POST-RADIATION 08/12/2009   Irritable bowel syndrome 12/23/2008   Migraine    Ovarian cyst    Ovarian cyst    THYROID NODULE, RIGHT 04/21/2009    Past Surgical History:  Procedure Laterality Date   ABDOMINAL HYSTERECTOMY     COLONOSCOPY     HYSTEROSCOPY     LAPAROSCOPY     NOVASURE ABLATION     POLYPECTOMY     TUBAL LIGATION     UPPER GI ENDOSCOPY     VAGINAL HYSTERECTOMY N/A 06/24/2013   Procedure: TOTAL HYSTERECTOMY VAGINAL;  Surgeon: Alwyn Pea, MD;  Location: [facility];  Service: Gynecology;  Laterality: N/A;    Social History   Socioeconomic History   Marital status: Married    Spouse name: Shanon Brow   Number of children: 3   Years of education: Not on file   Highest education level: Associate degree: occupational, Hotel manager, or vocational program  Occupational History   Occupation: WOMENS    Employer: [employer]  Tobacco Use   Smoking status: Never   Smokeless tobacco: Never  Vaping Use   Vaping Use: Never used  Substance and Sexual Activity   Alcohol use: No   Drug use: No   Sexual activity: Yes    Birth control/protection: Surgical    Comment: BTL  Other Topics Concern   Not on file  Social History Narrative   Regular exercise-yes      Patient is right-handed.She lives with her husband in a 1 story home. She has recently been avoiding caffeine. Walks.   Social Determinants of Radio broadcast assistant Strain: Not on file  Food Insecurity: No Food Insecurity   Worried About Charity fundraiser in the Last Year: Never true   Ran Out of Food in the Last Year: Never true  Transportation Needs: Not on file  Physical Activity: Not on file  Stress: Not on file  Social Connections: Not on file  Intimate  Partner Violence: Not on file    Family History  Problem Relation Age of Onset   Heart disease Mother    Thyroid disease Mother    Heart attack Mother    Cancer Father    Heart disease Father    Heart attack Father    Throat cancer Father    Lung cancer Father    Alcoholism Father    Berenice Primas' disease Sister    Asthma Sister    Hepatitis Sister    Stomach cancer Sister    CVA Brother    Alzheimer's disease Brother    Goiter Brother    Hypertension Brother    Hypertension Brother  Alcoholism Brother    Hypertension Paternal Grandfather    Healthy Daughter    Goiter Other        Siblings 2   GER disease Son    GER disease Son    Depression Neg Hx    Alcohol abuse Neg Hx    Drug abuse Neg Hx    Early death Neg Hx    Hearing loss Neg Hx    Kidney disease Neg Hx    Stroke Neg Hx      Review of Systems  Constitutional: Negative.  Negative for chills and fever.  HENT: Negative.  Negative for congestion and sore throat.   Respiratory: Negative.  Negative for cough and shortness of breath.   Cardiovascular:  Positive for chest pain and palpitations. Negative for leg swelling and PND.  Gastrointestinal:  Negative for abdominal pain, diarrhea, nausea and vomiting.  Genitourinary: Negative.  Negative for dysuria and hematuria.  Skin: Negative.  Negative for rash.  Neurological: Negative.  Negative for dizziness and headaches.  All other systems reviewed and are negative.  Today's Vitals   11/15/21 0826  BP: 126/70  Pulse: 79  SpO2: 98%  Weight: 129 lb (58.5 kg)  Height: 5' (1.524 m)   Body mass index is 25.19 kg/m.  Physical Exam Vitals reviewed.  Constitutional:      Appearance: Normal appearance.  HENT:     Head: Normocephalic.  Eyes:     Extraocular Movements: Extraocular movements intact.     Pupils: Pupils are equal, round, and reactive to light.  Cardiovascular:     Rate and Rhythm: Normal rate and regular rhythm.     Pulses: Normal pulses.      Heart sounds: Normal heart sounds.  Pulmonary:     Effort: Pulmonary effort is normal.     Breath sounds: Normal breath sounds.  Abdominal:     Palpations: Abdomen is soft.     Tenderness: There is no abdominal tenderness.  Musculoskeletal:     Cervical back: No tenderness.  Lymphadenopathy:     Cervical: No cervical adenopathy.  Skin:    General: Skin is warm and dry.     Capillary Refill: Capillary refill takes less than 2 seconds.  Neurological:     General: No focal deficit present.     Mental Status: She is alert and oriented to person, place, and time.  Psychiatric:        Mood and Affect: Mood normal.        Behavior: Behavior normal.    EKG: Normal sinus rhythm with ventricular rate of 61/min.  No acute ischemic changes.  Abnormal P wave morphology in lead V1  ASSESSMENT & PLAN: A total of 45 minutes was spent with the patient and counseling/coordination of care regarding preparing for this visit, review of most recent office visit notes, review of most recent blood work results, review of all medications, review of EKG, differential diagnosis of palpitations and chest pain, need for cardiology evaluation, prognosis, documentation and need for follow-up.  Problem List Items Addressed This Visit       Endocrine   Hypothyroidism    Clinically euthyroid. Lab Results  Component Value Date   TSH 1.17 10/12/2021  Continue Synthroid 75 mcg daily.         Other   Nonspecific chest pain    Normal cardiac work-up in 2017. Strong family history of heart disease in particular ischemic heart disease. Stress contributing to symptoms.  However needs cardiac work-up. Cardiology  referral placed today.  Patient requesting Dr. Einar Gip.      Relevant Orders   EKG 12-Lead   Ambulatory referral to Cardiology   Palpitations - Primary    Benign presentation.  No red flag signs or symptoms.  Normal EKG Needs cardiac work-up.      Relevant Orders   EKG 12-Lead   Ambulatory  referral to Cardiology   Family history of heart disease   Other Visit Diagnoses     Encounter for screening mammogram for malignant neoplasm of breast       Relevant Orders   MM Digital Screening      Patient Instructions  Palpitations Palpitations are feelings that your heartbeat is not normal. Your heartbeat may feel like it is: Uneven (irregular). Faster than normal. Fluttering. Skipping a beat. This is usually not a serious problem. However, a doctor will do tests and check your medical history to make sure that you do not have a serious heart problem. Follow these instructions at home: Watch for any changes in your condition. Tell your doctor about any changes. Take these actions to help manage your symptoms: Eating and drinking Follow instructions from your doctor about things to eat and drink. You may be told to avoid these things: Drinks that have caffeine in them, such as coffee, tea, soft drinks, and energy drinks. Chocolate. Alcohol. Diet pills. Lifestyle   Try to lower your stress. These things can help you relax: Yoga. Deep breathing and meditation. Guided imagery. This is using words and images to create positive thoughts. Exercise, including swimming, jogging, and walking. Tell your doctor if you have more abnormal heartbeats when you are active. If you have chest pain or feel short of breath with exercise, do not keep doing the exercise until you are seen by your doctor. Biofeedback. This is using your mind to control things in your body, such as your heartbeat. Get plenty of rest and sleep. Keep a regular bed time. Do not use drugs, such as cocaine or ecstasy. Do not use marijuana. Do not smoke or use any products that contain nicotine or tobacco. If you need help quitting, ask your doctor. General instructions Take over-the-counter and prescription medicines only as told by your doctor. Keep all follow-up visits. You may need more tests if palpitations do  not go away or get worse. Contact a doctor if: You keep having fast or uneven heartbeats for a long time. Your symptoms happen more often. Get help right away if: You have chest pain. You feel short of breath. You have a very bad headache. You feel dizzy. You faint. These symptoms may be an emergency. Get help right away. Call your local emergency services (911 in the [place] Do not wait to see if the symptoms will go away. Do not drive yourself to the hospital. Summary Palpitations are feelings that your heartbeat is uneven or faster than normal. It may feel like your heart is fluttering or skipping a beat. Avoid food and drinks that may cause this condition. These include caffeine, chocolate, and alcohol. Try to lower your stress. Do not smoke or use drugs. Get help right away if you faint, feel dizzy, feel short of breath, have chest pain, or have a very bad headache. This information is not intended to replace advice given to you by your health care provider. Make sure you discuss any questions you have with your health care provider. Document Revised: 01/18/2021 Document Reviewed: 01/18/2021 Elsevier Patient Education  [place].  Agustina Caroli, MD [facility]

## 2021-11-15 NOTE — Assessment & Plan Note (Signed)
Normal cardiac work-up in 2017. ?Strong family history of heart disease in particular ischemic heart disease. ?Stress contributing to symptoms.  However needs cardiac work-up. ?Cardiology referral placed today.  Patient requesting Dr. Einar Gip. ?

## 2021-11-15 NOTE — Assessment & Plan Note (Signed)
Clinically euthyroid. ?Lab Results  ?Component Value Date  ? TSH 1.17 10/12/2021  ?Continue Synthroid 75 mcg daily. ? ?

## 2021-11-15 NOTE — Assessment & Plan Note (Signed)
Benign presentation.  No red flag signs or symptoms.  Normal EKG ?Needs cardiac work-up. ?

## 2021-11-15 NOTE — Patient Instructions (Signed)
Palpitations ?Palpitations are feelings that your heartbeat is not normal. Your heartbeat may feel like it is: ?Uneven (irregular). ?Faster than normal. ?Fluttering. ?Skipping a beat. ?This is usually not a serious problem. However, a doctor will do tests and check your medical history to make sure that you do not have a serious heart problem. ?Follow these instructions at home: ?Watch for any changes in your condition. Tell your doctor about any changes. Take these actions to help manage your symptoms: ?Eating and drinking ?Follow instructions from your doctor about things to eat and drink. You may be told to avoid these things: ?Drinks that have caffeine in them, such as coffee, tea, soft drinks, and energy drinks. ?Chocolate. ?Alcohol. ?Diet pills. ?Lifestyle ?  ?Try to lower your stress. These things can help you relax: ?Yoga. ?Deep breathing and meditation. ?Guided imagery. This is using words and images to create positive thoughts. ?Exercise, including swimming, jogging, and walking. Tell your doctor if you have more abnormal heartbeats when you are active. If you have chest pain or feel short of breath with exercise, do not keep doing the exercise until you are seen by your doctor. ?Biofeedback. This is using your mind to control things in your body, such as your heartbeat. ?Get plenty of rest and sleep. Keep a regular bed time. ?Do not use drugs, such as cocaine or ecstasy. Do not use marijuana. ?Do not smoke or use any products that contain nicotine or tobacco. If you need help quitting, ask your doctor. ?General instructions ?Take over-the-counter and prescription medicines only as told by your doctor. ?Keep all follow-up visits. You may need more tests if palpitations do not go away or get worse. ?Contact a doctor if: ?You keep having fast or uneven heartbeats for a long time. ?Your symptoms happen more often. ?Get help right away if: ?You have chest pain. ?You feel short of breath. ?You have a very bad  headache. ?You feel dizzy. ?You faint. ?These symptoms may be an emergency. Get help right away. Call your local emergency services (911 in the [place] ?Do not wait to see if the symptoms will go away. ?Do not drive yourself to the hospital. ?Summary ?Palpitations are feelings that your heartbeat is uneven or faster than normal. It may feel like your heart is fluttering or skipping a beat. ?Avoid food and drinks that may cause this condition. These include caffeine, chocolate, and alcohol. ?Try to lower your stress. Do not smoke or use drugs. ?Get help right away if you faint, feel dizzy, feel short of breath, have chest pain, or have a very bad headache. ?This information is not intended to replace advice given to you by your health care provider. Make sure you discuss any questions you have with your health care provider. ?Document Revised: 01/18/2021 Document Reviewed: 01/18/2021 ?Elsevier Patient Education ? [employer]. ? ?

## 2021-11-21 ENCOUNTER — Encounter: Payer: Self-pay | Admitting: Emergency Medicine

## 2021-11-22 ENCOUNTER — Encounter: Payer: Self-pay | Admitting: Emergency Medicine

## 2021-11-23 NOTE — Telephone Encounter (Signed)
Work-up so far has been negative.  Bothers me that she is still symptomatic.  This could be a post viral syndrome type of picture.  She was referred to cardiology by [place] during the last visit.  Please look into this referral and try to speed it up if at all possible.  Thanks.

## 2021-12-01 DIAGNOSIS — R1110 Vomiting, unspecified: Secondary | ICD-10-CM | POA: Diagnosis not present

## 2021-12-01 DIAGNOSIS — K2950 Unspecified chronic gastritis without bleeding: Secondary | ICD-10-CM | POA: Diagnosis not present

## 2021-12-01 DIAGNOSIS — R142 Eructation: Secondary | ICD-10-CM | POA: Diagnosis not present

## 2021-12-01 DIAGNOSIS — K2289 Other specified disease of esophagus: Secondary | ICD-10-CM | POA: Diagnosis not present

## 2021-12-01 DIAGNOSIS — K219 Gastro-esophageal reflux disease without esophagitis: Secondary | ICD-10-CM | POA: Diagnosis not present

## 2021-12-01 DIAGNOSIS — R1010 Upper abdominal pain, unspecified: Secondary | ICD-10-CM | POA: Diagnosis not present

## 2021-12-01 DIAGNOSIS — K317 Polyp of stomach and duodenum: Secondary | ICD-10-CM | POA: Diagnosis not present

## 2021-12-01 NOTE — Telephone Encounter (Signed)
Sent pt message regarding referral to cardiology.  ?

## 2021-12-14 ENCOUNTER — Ambulatory Visit: Payer: 59 | Admitting: Cardiology

## 2021-12-14 ENCOUNTER — Encounter: Payer: Self-pay | Admitting: Cardiology

## 2021-12-14 VITALS — BP 121/65 | HR 66 | Temp 97.9°F | Resp 16 | Ht 60.0 in | Wt 132.6 lb

## 2021-12-14 DIAGNOSIS — R0789 Other chest pain: Secondary | ICD-10-CM | POA: Diagnosis not present

## 2021-12-14 DIAGNOSIS — E7800 Pure hypercholesterolemia, unspecified: Secondary | ICD-10-CM

## 2021-12-14 DIAGNOSIS — I358 Other nonrheumatic aortic valve disorders: Secondary | ICD-10-CM | POA: Diagnosis not present

## 2021-12-14 DIAGNOSIS — R002 Palpitations: Secondary | ICD-10-CM

## 2021-12-14 NOTE — Progress Notes (Signed)
? ?Primary Physician/Referring:  Horald Pollen, MD ? ?Patient ID: Chelsey Hunt, female    DOB: 11/20/1965, 56 y.o.   MRN: [id number] ? ?Chief Complaint  ?Patient presents with  ? New Patient (Initial Visit)  ?  Referred by Dr. Agustina Caroli  ? Palpitations  ? Chest Pain  ? ?HPI:   ? ?Chelsey Hunt  is a 56 y.o. African American female. 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  ? ?Patient was referred by Dr Agustina Caroli for sharp chest pain lasting for hours since December, 2022. She occasionally had fatigue and numbness at her left arm. She denied dyspnea on exertion, shortness of breath, and chest tightness. The pain is likely muscle skeleton etiology.  ? ?Her cholesterol is above the normal range. She can not tolerate rosuvastatin because of GI upset. Instead, she is working of diet control and exercise program. She states craving for sweets because of stress from work and family. Her PCP will recheck her cholesterol on June, 2023.  Chest pain symptoms described as occasional sharp pains either on the right or the left side of the chest.  She has had occasional episodes of chest pressure in the middle of the chest especially at night and sometimes associated with palpitations, mostly noticed when she has been working excessively and has been drinking coffee. ? ?Past Medical History:  ?Diagnosis Date  ? ACID REFLUX DISEASE 01/03/2008  ? Anemia   ? ANEMIA, IRON DEFICIENCY 12/23/2008  ? history  ? Anxiety   ? Chronic tension type headache 01/13/2009  ? migraines  ? Complication of anesthesia   ? pt states she just needs a small amount or she will sleep too long  ? Dysplasia of cervix, low grade (CIN 1)   ? Fibroid   ? GERD (gastroesophageal reflux disease)   ? HEART MURMUR, SYSTOLIC 19/41/7408  ? will have echo 06/18/2013  ? HYPOTHYROIDISM, POST-RADIATION 08/12/2009  ? Irritable bowel syndrome 12/23/2008  ? Migraine   ? Ovarian cyst   ? Ovarian cyst   ? THYROID NODULE, RIGHT 04/21/2009  ? ?Past  Surgical History:  ?Procedure Laterality Date  ? ABDOMINAL HYSTERECTOMY    ? COLONOSCOPY    ? HYSTEROSCOPY    ? LAPAROSCOPY    ? NOVASURE ABLATION    ? POLYPECTOMY    ? TUBAL LIGATION    ? UPPER GI ENDOSCOPY    ? VAGINAL HYSTERECTOMY N/A 06/24/2013  ? Procedure: TOTAL HYSTERECTOMY VAGINAL;  Surgeon: Alwyn Pea, MD;  Location: [facility];  Service: Gynecology;  Laterality: N/A;  ? ?Family History  ?Problem Relation Age of Onset  ? Heart disease Mother   ? Thyroid disease Mother   ? Heart attack Mother   ? Cancer Father   ? Heart disease Father   ? Heart attack Father   ? Throat cancer Father   ? Lung cancer Father   ? Alcoholism Father   ? Graves' disease Sister   ? Asthma Sister   ? Hepatitis Sister   ? Stomach cancer Sister   ? CVA Brother   ? Alzheimer's disease Brother   ? Goiter Brother   ? Hypertension Brother   ? Hypertension Brother   ? Alcoholism Brother   ? Hyperlipidemia Maternal Aunt   ? Hypertension Paternal Grandfather   ? Healthy Daughter   ? GER disease Son   ? GER disease Son   ? Goiter Other   ?     Siblings 2  ? Depression Neg  Hx   ? Alcohol abuse Neg Hx   ? Drug abuse Neg Hx   ? Early death Neg Hx   ? Hearing loss Neg Hx   ? Kidney disease Neg Hx   ? Stroke Neg Hx   ?  ?Social History  ? ?Tobacco Use  ? Smoking status: Never  ? Smokeless tobacco: Never  ?Substance Use Topics  ? Alcohol use: No  ? ?Marital Status: Married  ?ROS  ?Review of Systems  ?Cardiovascular:  Positive for chest pain and palpitations. Negative for dyspnea on exertion and leg swelling.  ?Objective  ?Blood pressure 121/65, pulse 66, temperature 97.9 ?F (36.6 ?C), temperature source Temporal, resp. rate 16, height 5' (1.524 m), weight 132 lb 9.6 oz (60.1 kg), last menstrual period 04/20/2013, SpO2 97 %. Body mass index is 25.9 kg/m?.  ? ?  12/14/2021  ?  9:23 AM 11/15/2021  ?  8:26 AM 10/12/2021  ?  8:20 AM  ?Vitals with BMI  ?Height '5\' 0"'$  '5\' 0"'$  '5\' 0"'$   ?Weight 132 lbs 10 oz 129 lbs 127 lbs 13 oz  ?BMI 25.9 25.19 24.96  ?Systolic 979  892 119  ?Diastolic 65 70 74  ?Pulse 66 79 90  ?  ?Physical Exam  ?Neck: No JVD present.  ?Cardiovascular: Regular rhythm, intact distal pulses and normal pulses. Exam reveals no gallop.  ?Murmur heard. ?Harsh midsystolic murmur is present at the upper right sternal border. ?Pulmonary/Chest: Effort normal and breath sounds normal. She exhibits no tenderness.  ?Abdominal: Soft. Bowel sounds are normal.  ?Musculoskeletal:     ?   General: No edema.  ? ?Medications and allergies  ? ?Allergies  ?Allergen Reactions  ? Imitrex [Sumatriptan] Palpitations  ? Reglan [Metoclopramide] Palpitations  ? Rosuvastatin Nausea Only  ? Propoxyphene N-Acetaminophen   ? Nabumetone Rash  ?  REACTION: GI upset  ? Topiramate Rash  ?  REACTION: rash  ?  ? ?Medication list after today's encounter  ? ?Current Outpatient Medications:  ?  acetaminophen (TYLENOL) 325 MG tablet, Take 650 mg by mouth every 6 (six) hours as needed for mild pain., Disp: , Rfl:  ?  co-enzyme Q-10 30 MG capsule, Take 200 mg by mouth 3 (three) times daily., Disp: , Rfl:  ?  cyclobenzaprine (FLEXERIL) 5 MG tablet, Take 1 tablet (5 mg total) by mouth 3 (three) times daily as needed for muscle spasms., Disp: 30 tablet, Rfl: 5 ?  dicyclomine (BENTYL) 10 MG capsule, Take 10 mg by mouth., Disp: , Rfl:  ?  famotidine (PEPCID) 40 MG tablet, Take 1 tablet (40 mg total) by mouth daily., Disp: 90 tablet, Rfl: 1 ?  ibuprofen (ADVIL,MOTRIN) 200 MG tablet, Take 200 mg by mouth every 8 (eight) hours as needed. , Disp: , Rfl:  ?  levothyroxine (SYNTHROID) 75 MCG tablet, Take 1 tablet (75 mcg total) by mouth daily., Disp: 90 tablet, Rfl: 3 ?  pantoprazole (PROTONIX) 20 MG tablet, Take 1 tablet (20 mg total) by mouth 2 (two) times daily before a meal Take 30-45 mins prior to breakfast and dinner., Disp: 180 tablet, Rfl: 3 ?  promethazine (PHENERGAN) 25 MG tablet, Take 1 tablet (25 mg total) by mouth every 6 (six) hours as needed for nausea or vomiting., Disp: 30 tablet, Rfl: 5 ?   Ubrogepant (UBRELVY) 100 MG TABS, TAKE 1 TABLET BY MOUTH AS NEEDED (MAY REPEAT IN 2 HOURS. MAXIMUM 2 TABLETS IN 24 HOURS.)., Disp: 16 tablet, Rfl: 3 ?  valACYclovir (VALTREX) 1000 MG tablet,  Take 1 tablet (1,000 mg total) by mouth every 12 (twelve) hours for 5 days, Disp: 30 tablet, Rfl: 11 ? ?Laboratory examination:  ? ?Recent Labs  ?  02/21/21 ?1545 10/12/21 ?0855  ?NA 140 140  ?K 4.1 4.1  ?CL 103 101  ?CO2 24 29  ?GLUCOSE 82 93  ?BUN 14 15  ?CREATININE 0.97 0.99  ?CALCIUM 10.3 10.6*  ? ?CrCl cannot be calculated (Patient's most recent lab result is older than the maximum 21 days allowed.).  ? ?  Latest Ref Rng & Units 10/12/2021  ?  8:55 AM 02/21/2021  ?  3:45 PM 03/07/2020  ?  1:26 PM  ?CMP  ?Glucose 70 - 99 mg/dL 93   82   88    ?BUN 6 - 23 mg/dL '15   14   16    '$ ?Creatinine 0.40 - 1.20 mg/dL 0.99   0.97   0.96    ?Sodium 135 - 145 mEq/L 140   140   140    ?Potassium 3.5 - 5.1 mEq/L 4.1   4.1   3.9    ?Chloride 96 - 112 mEq/L 101   103   104    ?CO2 19 - 32 mEq/L '29   24   30    '$ ?Calcium 8.4 - 10.5 mg/dL 10.6   10.3   10.2    ?Total Protein 6.0 - 8.3 g/dL 8.6   8.0   7.8    ?Total Bilirubin 0.2 - 1.2 mg/dL 0.4   0.4   0.4    ?Alkaline Phos 39 - 117 U/L 104   117   97    ?AST 0 - 37 U/L '20   18   15    '$ ?ALT 0 - 35 U/L '17   18   15    '$ ? ? ?  Latest Ref Rng & Units 10/12/2021  ?  8:55 AM 02/21/2021  ?  3:45 PM 03/07/2020  ?  1:26 PM  ?CBC  ?WBC 4.0 - 10.5 K/uL 5.1   7.1   4.7    ?Hemoglobin 12.0 - 15.0 g/dL 13.4   13.7   13.3    ?Hematocrit 36.0 - 46.0 % 40.8   40.9   39.9    ?Platelets 150.0 - 400.0 K/uL 376.0   388.0   357.0    ? ? ?Lipid Panel ?Recent Labs  ?  03/08/21 ?0903  ?CHOL 253*  ?TRIG 79.0  ?LDLCALC 169*  ?VLDL 15.8  ?HDL 69.10  ?CHOLHDL 4  ? ?Lipid Panel  ?   ?Component Value Date/Time  ? CHOL 253 (H) 03/08/2021 9509  ? TRIG 79.0 03/08/2021 0903  ? HDL 69.10 03/08/2021 0903  ? CHOLHDL 4 03/08/2021 0903  ? VLDL 15.8 03/08/2021 0903  ? [place] 169 (H) 03/08/2021 3267  ? LDLDIRECT 133.5 10/23/2013 1043  ?   ? ?HEMOGLOBIN A1C ?Lab Results  ?Component Value Date  ? HGBA1C 6.0 02/21/2021  ? ?TSH ?Recent Labs  ?  02/21/21 ?1545 10/12/21 ?0855  ?TSH 3.20 1.17  ? ? ?Radiology:  ? ?NA ? ?Cardiac Studies:  ? ?NA ? ?EKG:  ?

## 2021-12-21 ENCOUNTER — Ambulatory Visit: Payer: 59

## 2021-12-21 DIAGNOSIS — I358 Other nonrheumatic aortic valve disorders: Secondary | ICD-10-CM

## 2021-12-25 ENCOUNTER — Ambulatory Visit (INDEPENDENT_AMBULATORY_CARE_PROVIDER_SITE_OTHER): Payer: 59

## 2021-12-25 ENCOUNTER — Ambulatory Visit: Payer: 59 | Admitting: Podiatry

## 2021-12-25 DIAGNOSIS — M722 Plantar fascial fibromatosis: Secondary | ICD-10-CM

## 2021-12-25 DIAGNOSIS — M778 Other enthesopathies, not elsewhere classified: Secondary | ICD-10-CM

## 2021-12-25 MED ORDER — TRIAMCINOLONE ACETONIDE 10 MG/ML IJ SUSP
10.0000 mg | Freq: Once | INTRAMUSCULAR | Status: AC
  Administered 2021-12-25: 10 mg

## 2021-12-25 NOTE — Progress Notes (Signed)
Subjective:  ? ?Patient ID: Chelsey Hunt, female   DOB: 56 y.o.   MRN: [id number]  ? ?HPI ?Patient states that she has developed a lot of pain in the outside of the left foot and states that its been sore and hard for her to wear shoe gear.  States she did get improvement for around 10 months but over the last 3 to 4 weeks it started up again ? ? ?ROS ? ? ?   ?Objective:  ?Physical Exam  ?Neurovascular status intact inflammation pain around the fifth MPJ left with fluid buildup around the joint ? ?   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 ?   ?Plan:  ?H&P x-ray reviewed and today I went ahead did sterile prep and injected around the joint 3 mg dexamethasone Kenalog 5 mg Xylocaine advised on wider shoes and if symptoms persist ultimately may require surgical intervention in this case ? ?X-rays indicate reactivity around the fifth metatarsal head left ?   ? ? ?

## 2021-12-27 NOTE — Progress Notes (Signed)
Normal echo. The abnormal mass mentioned is most probably thick inter atrial septum (normal variant). No further evaluation indicated

## 2021-12-29 ENCOUNTER — Other Ambulatory Visit (HOSPITAL_COMMUNITY): Payer: Self-pay

## 2022-01-04 ENCOUNTER — Ambulatory Visit: Payer: 59 | Admitting: Neurology

## 2022-01-12 ENCOUNTER — Ambulatory Visit
Admission: RE | Admit: 2022-01-12 | Discharge: 2022-01-12 | Disposition: A | Discharge status: Home / Self Care | Payer: No Typology Code available for payment source | Source: Ambulatory Visit | Attending: Cardiology | Admitting: Cardiology

## 2022-01-12 DIAGNOSIS — E7800 Pure hypercholesterolemia, unspecified: Secondary | ICD-10-CM

## 2022-01-12 DIAGNOSIS — R0789 Other chest pain: Secondary | ICD-10-CM

## 2022-01-14 NOTE — Progress Notes (Signed)
Coronary calcium score 01/14/2022: ?Total agate stent score 0.  Ascending and descending thoracic aortic measurements are normal.  No significant extracardiac abnormality, single calcification noted in the distal aortic arch.  Essentially normal.

## 2022-01-17 DIAGNOSIS — L282 Other prurigo: Secondary | ICD-10-CM | POA: Diagnosis not present

## 2022-01-17 DIAGNOSIS — L293 Anogenital pruritus, unspecified: Secondary | ICD-10-CM | POA: Diagnosis not present

## 2022-01-19 ENCOUNTER — Ambulatory Visit: Payer: 59

## 2022-01-19 DIAGNOSIS — R0789 Other chest pain: Secondary | ICD-10-CM

## 2022-01-22 NOTE — Progress Notes (Signed)
? ?NEUROLOGY FOLLOW UP OFFICE NOTE ? ?Chelsey Hunt ?[id number] ? ?Assessment/Plan:  ? 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 ?  ?1 Restart chiropractic treatment, treatment of OSA, diet, increase exercise ?2  Ubrelvy as needed ?3  Cyclobenzaprine for neck pain ?4  Follow up in 9 months. ?  ?Subjective:  ?Chelsey Hunt is a 56 year old right-handed woman with hypothyroidism who follows up for migraines. ?  ?UPDATE: ?Overall she is better.  Some good days and some bad days.  She has had 3 severe migraines over the past month and 5 moderate migraines.  They last 2-3 hours.  Poor sleep is a trigger for severe ones.  She hasn't been to the chiropractor since December.  She thinks it did help.   ?  ? ?  ?Current NSAIDS: ASA '81mg'$  ?Current analgesics: none ?Current triptans: None ?Current ergotamine: None ?Current abortive anti-CGRP:  none ?Current anti-emetic: Promethazine ?Current muscle relaxants: Cyclobenzaprine 10 mg ?Current anti-anxiolytic: None ?Current sleep aide: None ?Current Antihypertensive medications: None ?Current Antidepressant medications: none ?Current Anticonvulsant medications: gabapentin '100mg'$  QHS (for scalp neuralgia; cannot tolerate higher doses) ?Current anti-CGRP: Ubrelvy '100mg'$  ?Current Vitamins/Herbal/Supplements: Co Q-10; turmeric ?Current Antihistamines/Decongestants: None ?Other therapy: Physical therapy for neck pain; dry needling, chiropractor ?Hormone/birth control: None ?  ?Caffeine: No ?Diet: 5-6 bottles of water daily ?Exercise: She is now exercising - treadmill and bike 1 day a week.  Trying to increase to 2-3 times a week. ?Depression: No; Anxiety: Yes ?Other pain:  neck pain.  She has cervical spondylosis ?Sleep hygiene: Still does not have a CPAP because she couldn't afford it at the time.  She is ready to finally  pick it up.   ?  ?HISTORY: ?Onset: First migraine was between 62 and 69 years old.  Migraine free until 2004 during her menstrual cycle.  Chronic for many years. ?Location:  Left more than right. ?Quality:  throbbing ?Initial intensity:  Severe.   ?Aura:  Initially no.  Later developed visual aura of "strands of hair floating" in her vision. ?Prodrome:  no ?Postdrome:  fatigue ?Associated symptoms:  Right facial tingling, nausea, photophobia, phonophobia, osmophobia.  She denies associated vomiting, visual disturbance or unilateral numbness or weakness. ?Initial duration:  3 days ?Initial Frequency:  daily ?Initial Frequency of abortive medication: Advil or Tylenol daily, Maxalt 2 days a week ?Triggers:  Certain smells, change in barometric pressure, sausages, certain cheese, bananas; brazilian nutes, COVID-19 (contracted in September 2020) ?Relieving factors: Exercise, staying hydrated ?Activity:  aggravates ?  ?During summer 2019, she has had throbbing occipital headache, right-sided neck pain radiating up to the occiput, a burning scalp neuralgia, associated with tinnitus, sometimes left or right sided facial numbness, facial twitching.  She started having a panic attack with palpitations.  She went to the ED on 06/26/18 for symptoms.  EKG showed sinus tachycardia.  She was treated with headache cocktail.  To evaluate worsening headaches, she had an MRI of the brain on 11/21/2018 which demonstrated nonspecific white matter hyperintensities in the cerebral white matter, likely chronic small vessel ischemic changes, but no acute abnormality.  For further evaluation of neck pain, cervical spine X-ray on 04/10/2019 showed slight refersal of the normal cervical lordosis with minimal retrolisthesis of C5 on C6 and cervical spondylosis with osteophytes most notable at C4-5 and C5-6.   ?  ?Due to fatigue, her PCP checked for autoimmune condition.  ANA was positive with positive cyclic citrullin peptide antibody.  She has  been referred to rheumatology.  She saw sleep medicine.  She was found to have moderate OSA and was ordered auto-PAP.  ?  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   ?She has bilateral tinnitus.  Exam by ENT unremarkable. ?   ?Past NSAIDS: Ibuprofen, naproxen, Cambia ?Past analgesics:  Excedrin, Lidocaine nasal ?Past abortive triptans: Relpax, sumatriptan tablet, Maxalt, Tosymra NS, Zembrace.   ?Past abortive ergotamine: Migranal ?Past muscle relaxants:  none ?Past anti-emetic:  Zofran ?Past antihypertensive medications: Verapamil, propranolol ?Past antidepressant medications: Imipramine, nortriptyline ?Past anticonvulsant medications:  topiramate ?Past anti-CGRP:  Aimovig (aggravated migraine), Emgality (ineffective), Ajovy, Nurtec ?Past vitamins/Herbal/Supplements: Riboflavin, magnesium ?Past antihistamines/decongestants:  None ?Other past therapies:  acupuncture (triggered migraine), Reyvow ?  ?Family history of headache:  Mom (migraines), daughter (migraines) ?  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  MRI of the brain on 11/21/2018 which demonstrated nonspecific white matter hyperintensities in the cerebral white matter.  To evaluate new visual aura with migraine (hair floating in her vision), MRI of brain with and without contrast performed on 02/10/2020 again demonstrated non-enhancing white matter T2 and FLAIR signal in the cerebral hemispheres, stable compared to prior imaging from 2020.  To further evaluate abnormal white matter on brain MRI, she had MRI of cervical and thoracic spine without contrast on 03/26/2020 which showed no cord lesions but did show incidental 1 cm T1 and T2 dark ovoid lesion in the T6 vertebral body.  Follow up MRI thoracic spine with  contrast on 05/25/2020 showed no change and no enhancement, thought to represent benign marrow focus .  Vasculitis workup, including SSA/SSB antibodies and pan-ANCA panel, was negative.  Sed rate was 14 and CRP negative.She was evaluated by ophthalmology in September 2021 which demonstrated dry eye but no concerning findings such as optic neuritis. ? ?PAST MEDICAL HISTORY: ?Past Medical History:  ?Diagnosis Date  ? ACID REFLUX DISEASE 01/03/2008  ? Anemia   ? ANEMIA, IRON DEFICIENCY 12/23/2008  ? history  ? Anxiety   ? Chronic tension type headache 01/13/2009  ? migraines  ? Complication of anesthesia   ? pt states she just needs a small amount or she will sleep too long  ? Dysplasia of cervix, low grade (CIN 1)   ? Fibroid   ? GERD (gastroesophageal reflux disease)   ? HEART MURMUR, SYSTOLIC 99/37/1696  ? will have echo 06/18/2013  ? HYPOTHYROIDISM, POST-RADIATION 08/12/2009  ? Irritable bowel syndrome 12/23/2008  ? Migraine   ? Ovarian cyst   ? Ovarian cyst   ? THYROID NODULE, RIGHT 04/21/2009  ? ? ?MEDICATIONS: ?Current Outpatient Medications on File Prior to Visit  ?Medication Sig Dispense Refill  ? acetaminophen (TYLENOL) 325 MG tablet Take 650 mg by mouth every 6 (six) hours as needed for mild pain.    ? co-enzyme Q-10 30 MG capsule Take 200 mg by mouth 3 (three) times daily.    ? cyclobenzaprine (FLEXERIL) 5 MG tablet Take 1 tablet (5 mg total) by mouth 3 (three) times daily as needed for muscle spasms. 30 tablet 5  ? dicyclomine (BENTYL) 10 MG capsule Take 10 mg by mouth.    ? famotidine (PEPCID) 40 MG tablet Take 1 tablet (40 mg total) by mouth daily. 90 tablet  1  ? ibuprofen (ADVIL,MOTRIN) 200 MG tablet Take 200 mg by mouth every 8 (eight) hours as needed.     ? levothyroxine (SYNTHROID) 75 MCG tablet Take 1 tablet (75 mcg total) by mouth daily. 90 tablet 3  ? pantoprazole (PROTONIX) 20 MG tablet Take 1 tablet (20 mg total) by mouth 2 (two) times daily before a meal Take 30-45 mins prior to breakfast and dinner.  180 tablet 3  ? promethazine (PHENERGAN) 25 MG tablet Take 1 tablet (25 mg total) by mouth every 6 (six) hours as needed for nausea or vomiting. 30 tablet 5  ? Ubrogepant (UBRELVY) 100 MG TABS TAKE 1 TABLET

## 2022-01-23 ENCOUNTER — Encounter: Payer: Self-pay | Admitting: Neurology

## 2022-01-23 ENCOUNTER — Other Ambulatory Visit (HOSPITAL_COMMUNITY): Payer: Self-pay

## 2022-01-23 ENCOUNTER — Ambulatory Visit: Payer: 59 | Admitting: Neurology

## 2022-01-23 VITALS — BP 124/64 | HR 77 | Ht 60.0 in | Wt 130.8 lb

## 2022-01-23 DIAGNOSIS — T148XXA Other injury of unspecified body region, initial encounter: Secondary | ICD-10-CM

## 2022-01-23 DIAGNOSIS — G4733 Obstructive sleep apnea (adult) (pediatric): Secondary | ICD-10-CM

## 2022-01-23 DIAGNOSIS — G43009 Migraine without aura, not intractable, without status migrainosus: Secondary | ICD-10-CM

## 2022-01-23 DIAGNOSIS — R9082 White matter disease, unspecified: Secondary | ICD-10-CM | POA: Diagnosis not present

## 2022-01-23 MED ORDER — UBRELVY 100 MG PO TABS
100.0000 mg | ORAL_TABLET | ORAL | 5 refills | Status: DC | PRN
  Filled 2022-01-23 – 2022-04-13 (×2): qty 16, 30d supply, fill #0

## 2022-01-23 MED ORDER — CYCLOBENZAPRINE HCL 5 MG PO TABS
5.0000 mg | ORAL_TABLET | Freq: Three times a day (TID) | ORAL | 5 refills | Status: DC | PRN
  Filled 2022-01-23 – 2022-04-10 (×2): qty 30, 10d supply, fill #0
  Filled 2022-10-24: qty 30, 10d supply, fill #1

## 2022-01-23 NOTE — Patient Instructions (Signed)
Ubrelvy and cyclobenzaprine refilled ?Start CPAP when you can ?Increase exercise frequency  ?Keep headache diary ?Limit use of pain relievers to no more than 2 days out of week to prevent risk of rebound or medication-overuse headache. ?Follow up 9 months. ?

## 2022-01-25 ENCOUNTER — Ambulatory Visit: Payer: 59 | Admitting: Cardiology

## 2022-01-26 ENCOUNTER — Ambulatory Visit: Payer: 59 | Admitting: Cardiology

## 2022-01-26 ENCOUNTER — Encounter: Payer: Self-pay | Admitting: Cardiology

## 2022-01-26 VITALS — BP 112/52 | HR 63 | Temp 98.0°F | Resp 16 | Ht 60.0 in | Wt 127.8 lb

## 2022-01-26 DIAGNOSIS — I358 Other nonrheumatic aortic valve disorders: Secondary | ICD-10-CM

## 2022-01-26 DIAGNOSIS — E7800 Pure hypercholesterolemia, unspecified: Secondary | ICD-10-CM

## 2022-01-26 NOTE — Progress Notes (Signed)
Primary Physician/Referring:  Horald Pollen, MD  Patient ID: Chelsey Hunt, female    DOB: 01-Apr-1966, 56 y.o.   MRN: [id number]  Chief Complaint  Patient presents with   Chest Pain   Heart Murmur   Follow-up   HPI:    Chelsey Hunt  is a 56 y.o. African American female. 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  She was evaluated by PCP for 1 episode of chest pain in December 2022 with no recurrence.  Her LDL is markedly elevated but could not tolerate rosuvastatin due to GI disturbances.  She now presents for follow-up, underwent echocardiogram and routine treadmill exercise stress test.  She has not had any further chest pain, denies dyspnea, remains asymptomatic.   Past Medical History:  Diagnosis Date   ACID REFLUX DISEASE 01/03/2008   Anemia    ANEMIA, IRON DEFICIENCY 12/23/2008   history   Anxiety    Chronic tension type headache 01/13/2009   migraines   Complication of anesthesia    pt states she just needs a small amount or she will sleep too long   Dysplasia of cervix, low grade (CIN 1)    Fibroid    GERD (gastroesophageal reflux disease)    HEART MURMUR, SYSTOLIC 26/83/4196   will have echo 06/18/2013   HYPOTHYROIDISM, POST-RADIATION 08/12/2009   Irritable bowel syndrome 12/23/2008   Migraine    Ovarian cyst    Ovarian cyst    THYROID NODULE, RIGHT 04/21/2009   Social History   Tobacco Use   Smoking status: Never   Smokeless tobacco: Never  Substance Use Topics   Alcohol use: No   Marital Status: Married  ROS  Review of Systems  Cardiovascular:  Negative for chest pain, dyspnea on exertion, leg swelling and palpitations.   Objective  Blood pressure (!) 112/52, pulse 63, temperature 98 F (36.7 C), temperature source Temporal, resp. rate 16, height 5' (1.524 m), weight 127 lb 12.8 oz (58 kg), last menstrual period 04/20/2013, SpO2 100 %. Body mass index is 24.96 kg/m.     01/26/2022    2:10 PM 01/23/2022    8:36 AM 12/14/2021    9:23  AM  Vitals with BMI  Height '5\' 0"'$  '5\' 0"'$  '5\' 0"'$   Weight 127 lbs 13 oz 130 lbs 13 oz 132 lbs 10 oz  BMI 24.96 22.29 79.8  Systolic 921 194 174  Diastolic 52 64 65  Pulse 63 77 66    Physical Exam  Neck: No JVD present.  Cardiovascular: Regular rhythm, intact distal pulses and normal pulses. Exam reveals no gallop.  Murmur heard. Midsystolic murmur is present with a grade of 2/6 at the upper right sternal border. Pulmonary/Chest: Effort normal and breath sounds normal. She exhibits no tenderness.  Abdominal: Soft. Bowel sounds are normal.  Musculoskeletal:        General: No edema.   Allergies   Allergies  Allergen Reactions   Imitrex [Sumatriptan] Palpitations   Reglan [Metoclopramide] Palpitations   Rosuvastatin Nausea Only   Propoxyphene N-Acetaminophen    Nabumetone Rash    REACTION: GI upset   Topiramate Rash    REACTION: rash    Medication list after today's encounter   Current Outpatient Medications:    acetaminophen (TYLENOL) 325 MG tablet, Take 650 mg by mouth every 6 (six) hours as needed for mild pain., Disp: , Rfl:    co-enzyme Q-10 30 MG capsule, Take 200 mg by mouth 3 (three) times daily., Disp: , Rfl:  cyclobenzaprine (FLEXERIL) 5 MG tablet, Take 1 tablet (5 mg total) by mouth 3 (three) times daily as needed for muscle spasms., Disp: 30 tablet, Rfl: 5   dicyclomine (BENTYL) 10 MG capsule, Take 10 mg by mouth., Disp: , Rfl:    famotidine (PEPCID) 40 MG tablet, Take 1 tablet (40 mg total) by mouth daily., Disp: 90 tablet, Rfl: 1   ibuprofen (ADVIL,MOTRIN) 200 MG tablet, Take 200 mg by mouth every 8 (eight) hours as needed. , Disp: , Rfl:    levothyroxine (SYNTHROID) 75 MCG tablet, Take 1 tablet (75 mcg total) by mouth daily., Disp: 90 tablet, Rfl: 3   pantoprazole (PROTONIX) 20 MG tablet, Take 1 tablet (20 mg total) by mouth 2 (two) times daily before a meal Take 30-45 mins prior to breakfast and dinner., Disp: 180 tablet, Rfl: 3   promethazine (PHENERGAN) 25 MG  tablet, Take 1 tablet (25 mg total) by mouth every 6 (six) hours as needed for nausea or vomiting., Disp: 30 tablet, Rfl: 5   Ubrogepant (UBRELVY) 100 MG TABS, Take 1 tablet (100 mg) by mouth as needed. May repeat dose in 2 hours.  Maximum 2 tablets in 24 hours., Disp: 16 tablet, Rfl: 5   valACYclovir (VALTREX) 1000 MG tablet, Take 1 tablet (1,000 mg total) by mouth every 12 (twelve) hours for 5 days, Disp: 30 tablet, Rfl: 11  Laboratory examination:   Recent Labs    02/21/21 1545 10/12/21 0855  NA 140 140  K 4.1 4.1  CL 103 101  CO2 24 29  GLUCOSE 82 93  BUN 14 15  CREATININE 0.97 0.99  CALCIUM 10.3 10.6*   CrCl cannot be calculated (Patient's most recent lab result is older than the maximum 21 days allowed.).     Latest Ref Rng & Units 10/12/2021    8:55 AM 02/21/2021    3:45 PM 03/07/2020    1:26 PM  CMP  Glucose 70 - 99 mg/dL 93   82   88    BUN 6 - 23 mg/dL '15   14   16    '$ Creatinine 0.40 - 1.20 mg/dL 0.99   0.97   0.96    Sodium 135 - 145 mEq/L 140   140   140    Potassium 3.5 - 5.1 mEq/L 4.1   4.1   3.9    Chloride 96 - 112 mEq/L 101   103   104    CO2 19 - 32 mEq/L '29   24   30    '$ Calcium 8.4 - 10.5 mg/dL 10.6   10.3   10.2    Total Protein 6.0 - 8.3 g/dL 8.6   8.0   7.8    Total Bilirubin 0.2 - 1.2 mg/dL 0.4   0.4   0.4    Alkaline Phos 39 - 117 U/L 104   117   97    AST 0 - 37 U/L '20   18   15    '$ ALT 0 - 35 U/L '17   18   15        '$ Latest Ref Rng & Units 10/12/2021    8:55 AM 02/21/2021    3:45 PM 03/07/2020    1:26 PM  CBC  WBC 4.0 - 10.5 K/uL 5.1   7.1   4.7    Hemoglobin 12.0 - 15.0 g/dL 13.4   13.7   13.3    Hematocrit 36.0 - 46.0 % 40.8   40.9   39.9  Platelets 150.0 - 400.0 K/uL 376.0   388.0   357.0      Lipid Panel Recent Labs    03/08/21 0903  CHOL 253*  TRIG 79.0  LDLCALC 169*  VLDL 15.8  HDL 69.10  CHOLHDL 4   Lipid Panel     Component Value Date/Time   CHOL 253 (H) 03/08/2021 0903   TRIG 79.0 03/08/2021 0903   HDL 69.10 03/08/2021  0903   CHOLHDL 4 03/08/2021 0903   VLDL 15.8 03/08/2021 0903   LDLCALC 169 (H) 03/08/2021 0903   LDLDIRECT 133.5 10/23/2013 1043     HEMOGLOBIN A1C Lab Results  Component Value Date   HGBA1C 6.0 02/21/2021   TSH Recent Labs    02/21/21 1545 10/12/21 0855  TSH 3.20 1.17    Radiology:   NA  Cardiac Studies:  CORONARY CALCIUM SCORES 01/12/2022:  Left Main: 0  LAD: 0  LCx: 0  RCA: 0  Total Agatston Score: 0  MESA database percentile: 0    Ascending Aorta: 24 mm  Descending Aorta: 19 mm   OTHER FINDINGS: Heart is normal size. Aorta normal caliber. Single punctate calcification seen in the distal aortic arch. No adenopathy. No confluent opacities or effusions. No acute findings in the upper abdomen. Chest wall soft tissues are unremarkable. No acute bony abnormality.   IMPRESSION: No visible coronary artery calcifications. Total coronary calcium score of 0. Single calcification seen in the distal aortic arch. No acute extra cardiac abnormality.  Echocardiogram 12/21/2021:  Normal LV systolic function with visual EF 60-65%. Left ventricle cavity is normal in size. Normal left ventricular wall thickness. Normal global wall motion. Normal diastolic filling pattern, normal LAP.  No significant valvular abnormalities.  Echo density noted within the right atrium in the RV inflow and subcostal view (DDx prominent eustachian valve, artifact due to echogenic tricuspid annulus, etc). Images attached.  Consider either transesophageal echocardiogram, cardiac MRI, or limited echo with Definity for further evaluation. Clinical correlation needed.  No prior study for comparison.  Exercise treadmill stress test 01/19/2022: Exercise treadmill stress test performed using Bruce protocol.  Patient reached 10.3 METS, and 90% of age predicted maximum heart rate.  Exercise capacity was excellent. No chest pain reported.  Normal heart rate and hemodynamic response. Stress EKG revealed no ischemic  changes. Low risk study.  EKG:   12/14/2021: Sinus rhythm at rate of 76 bmp, normal axis, incomplete RBBB, no ischemia change. No significant change compared to EKG 11/15/2021.  Assessment     ICD-10-CM   1. Hypercholesteremia  E78.00     2. Aortic systolic murmur on examination  I35.8       Patient's cardiovascular risk history includes: [place] 10-year risk <10%  ACC/AHA CV risk calculator: 10-year risk of atherosclerotic cardiovascular disease 2.3%, optimal risk 2%.  There are no discontinued medications.   No orders of the defined types were placed in this encounter.  No orders of the defined types were placed in this encounter.  Recommendations:   Kelsee Preslar is a 56 y.o. African American female. 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 Patient was referred by Dr Agustina Caroli for sharp chest pain lasting for hours since December, 2022. She occasionally had fatigue and numbness at her left arm. She denied dyspnea on exertion, shortness of breath, and chest tightness. The pain is likely muscle skeleton etiology.  However because her cholesterol was markedly elevated, although symptoms were atypical, she was referred to me for evaluation.  I reviewed the  results of the echocardiogram, although the echocardiogram report reveals presence of abnormal structure in the left atrium, review of the images reveal most likely Chiari network which is a normal variant.  Hence I do not think she needs any further cardiac work-up.  Her systolic murmur is just a flow murmur otherwise structurally normal heart.  Her markedly elevated LDL was evaluated, since her coronary calcium score is 0, she has had a excellent exercise tolerance without ischemia, I calculated ACC/AHA CV risk calculator and she is at very low risk.  At this point shared decision making regarding lipid-lowering therapy, patient prefers to continue lifestyle modification only and so did not start her on any  medication in the absence of exposure to tobacco, no hypertension, no diabetes mellitus or family history of premature coronary disease.    Adrian Prows, MD, [facility] 01/26/2022, 4:54 PM Office: [phone] Fax: [phone] Pager: [phone]

## 2022-01-31 ENCOUNTER — Other Ambulatory Visit (HOSPITAL_COMMUNITY): Payer: Self-pay

## 2022-02-06 ENCOUNTER — Other Ambulatory Visit (HOSPITAL_COMMUNITY): Payer: Self-pay

## 2022-03-05 ENCOUNTER — Other Ambulatory Visit (HOSPITAL_COMMUNITY): Payer: Self-pay

## 2022-03-05 DIAGNOSIS — K581 Irritable bowel syndrome with constipation: Secondary | ICD-10-CM | POA: Diagnosis not present

## 2022-03-05 DIAGNOSIS — Z1211 Encounter for screening for malignant neoplasm of colon: Secondary | ICD-10-CM | POA: Diagnosis not present

## 2022-03-05 DIAGNOSIS — K219 Gastro-esophageal reflux disease without esophagitis: Secondary | ICD-10-CM | POA: Diagnosis not present

## 2022-03-08 ENCOUNTER — Encounter: Payer: Self-pay | Admitting: Emergency Medicine

## 2022-03-08 ENCOUNTER — Ambulatory Visit: Payer: 59 | Admitting: Emergency Medicine

## 2022-03-08 VITALS — BP 124/72 | HR 70 | Temp 98.0°F | Ht 60.0 in | Wt 132.0 lb

## 2022-03-08 DIAGNOSIS — E039 Hypothyroidism, unspecified: Secondary | ICD-10-CM

## 2022-03-08 DIAGNOSIS — Z789 Other specified health status: Secondary | ICD-10-CM | POA: Diagnosis not present

## 2022-03-08 DIAGNOSIS — K5904 Chronic idiopathic constipation: Secondary | ICD-10-CM | POA: Diagnosis not present

## 2022-03-08 DIAGNOSIS — G4733 Obstructive sleep apnea (adult) (pediatric): Secondary | ICD-10-CM | POA: Diagnosis not present

## 2022-03-08 DIAGNOSIS — R7303 Prediabetes: Secondary | ICD-10-CM

## 2022-03-08 DIAGNOSIS — E785 Hyperlipidemia, unspecified: Secondary | ICD-10-CM

## 2022-03-08 DIAGNOSIS — Z8249 Family history of ischemic heart disease and other diseases of the circulatory system: Secondary | ICD-10-CM

## 2022-03-08 DIAGNOSIS — G43019 Migraine without aura, intractable, without status migrainosus: Secondary | ICD-10-CM

## 2022-03-08 DIAGNOSIS — K2100 Gastro-esophageal reflux disease with esophagitis, without bleeding: Secondary | ICD-10-CM

## 2022-03-08 LAB — COMPREHENSIVE METABOLIC PANEL
ALT: 15 U/L (ref 0–35)
AST: 17 U/L (ref 0–37)
Albumin: 4.4 g/dL (ref 3.5–5.2)
Alkaline Phosphatase: 107 U/L (ref 39–117)
BUN: 14 mg/dL (ref 6–23)
CO2: 28 mEq/L (ref 19–32)
Calcium: 10.1 mg/dL (ref 8.4–10.5)
Chloride: 102 mEq/L (ref 96–112)
Creatinine, Ser: 1.03 mg/dL (ref 0.40–1.20)
GFR: 60.67 mL/min (ref >60.00)
Glucose, Bld: 87 mg/dL (ref 70–99)
Potassium: 4.2 mEq/L (ref 3.5–5.1)
Sodium: 136 mEq/L (ref 135–145)
Total Bilirubin: 0.4 mg/dL (ref 0.2–1.2)
Total Protein: 7.8 g/dL (ref 6.0–8.3)

## 2022-03-08 LAB — TSH: TSH: 2.12 u[IU]/mL (ref 0.35–5.50)

## 2022-03-08 LAB — CBC WITH DIFFERENTIAL/PLATELET
Basophils Absolute: 0 10*3/uL (ref 0.0–0.1)
Basophils Relative: 0.4 % (ref 0.0–3.0)
Eosinophils Absolute: 0.3 10*3/uL (ref 0.0–0.7)
Eosinophils Relative: 5.7 % — ABNORMAL HIGH (ref 0.0–5.0)
HCT: 39.5 % (ref 36.0–46.0)
Hemoglobin: 13.3 g/dL (ref 12.0–15.0)
Lymphocytes Relative: 49 % — ABNORMAL HIGH (ref 12.0–46.0)
Lymphs Abs: 2.3 10*3/uL (ref 0.7–4.0)
MCHC: 33.7 g/dL (ref 30.0–36.0)
MCV: 93.1 fl (ref 78.0–100.0)
Monocytes Absolute: 0.3 10*3/uL (ref 0.1–1.0)
Monocytes Relative: 6.1 % (ref 3.0–12.0)
Neutro Abs: 1.8 10*3/uL (ref 1.4–7.7)
Neutrophils Relative %: 38.8 % — ABNORMAL LOW (ref 43.0–77.0)
Platelets: 351 10*3/uL (ref 150.0–400.0)
RBC: 4.24 Mil/uL (ref 3.87–5.11)
RDW: 13.6 % (ref 11.5–15.5)
WBC: 4.7 10*3/uL (ref 4.0–10.5)

## 2022-03-08 LAB — LIPID PANEL
Cholesterol: 244 mg/dL — ABNORMAL HIGH (ref 0–200)
HDL: 69.7 mg/dL (ref >39.00)
LDL Cholesterol: 157 mg/dL — ABNORMAL HIGH (ref 0–99)
NonHDL: 174.24
Total CHOL/HDL Ratio: 3
Triglycerides: 85 mg/dL (ref 0.0–149.0)
VLDL: 17 mg/dL (ref 0.0–40.0)

## 2022-03-08 LAB — HEMOGLOBIN A1C: Hgb A1c MFr Bld: 6 % (ref 4.6–6.5)

## 2022-03-08 NOTE — Assessment & Plan Note (Signed)
Diet and nutrition discussed.  Advised to decrease amount of daily carbohydrate intake.

## 2022-03-08 NOTE — Assessment & Plan Note (Signed)
Well-controlled.  Stable.  Lorrin Goodell as needed.

## 2022-03-08 NOTE — Patient Instructions (Signed)

## 2022-03-08 NOTE — Assessment & Plan Note (Signed)
Unremarkable and normal extensive recent cardiac work-up.

## 2022-03-08 NOTE — Assessment & Plan Note (Signed)
Well-controlled on CPAP treatment.

## 2022-03-08 NOTE — Assessment & Plan Note (Signed)
Well-controlled.  Continue pantoprazole 20 mg twice a day and famotidine 40 mg at bedtime.

## 2022-03-08 NOTE — Progress Notes (Signed)
Chelsey Hunt 56 y.o.   Chief Complaint  Patient presents with   Follow-up    6 mnth f/u appt, no concerns    HISTORY OF PRESENT ILLNESS: This is a 56 y.o. female here for 30-monthfollow-up of chronic medical problems. Since her last visit she has seen neurologist, cardiologist, and GI doctor with pretty good work-ups and acceptable results. Up-to-date with mammogram Mostly concerned about her cholesterol levels.  Intolerant to statins. Most recent GI evaluation as follows: ASSESSMENT  1. Gastroesophageal reflux disease, unspecified whether esophagitis present  2. Irritable bowel syndrome with constipation  3. Colon cancer screening   Orders Placed This Encounter  Procedures   Occult Blood, Fecal, IA (Sendout)   PLAN 1.) Continue pantoprazole '20mg'$  BID and famotidine '40mg'$  QHS and Bentyl '10mg'$  prn 2.) Discussed further work up with 24 hour pH with impedence and HRM (on therapy) but she declines. She says she had these done in her [place] had a very difficult time tolerating it and does not want to try again 3.) Screening colonoscopy 09/2025; will do iFOB as adjunct to CRC screening 4.) RTO prn  Electronically signed by: Chelsey Dunk PA-C 03/05/22 1505  Most recent cardiologist office visit assessment and plan as follows: Assessment        ICD-10-CM    1. Atypical chest pain  R07.89 EKG 12-Lead      PCV CARDIAC STRESS TEST      CT CARDIAC SCORING ([facility] LOCATIONS ONLY)     2. Aortic systolic murmur on examination  I35.8 PCV ECHOCARDIOGRAM COMPLETE     3. Hypercholesteremia  E78.00 CT CARDIAC SCORING ([facility] LOCATIONS ONLY)     4. Palpitations      Recommendations:    Chelsey Luedkeis a 56y.o. African American female. 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 Patient was referred by Dr MAgustina Carolifor sharp chest pain lasting for hours since December, 2022. She occasionally had fatigue and numbness at her left arm. She denied dyspnea on exertion,  shortness of breath, and chest tightness. The pain is likely muscle skeleton etiology.    Her cholesterol is above the normal range. She can not tolerate rosuvastatin because of GI upset. Instead, she is working of diet control and exercise program. She states craving for sweets because of stress from work and family. Her PCP will recheck her cholesterol on June, 2023.    Regarding her cholesterol level and ethnicity, perimenopausal, chest pain symptoms although appear to be musculoskeletal, in view of her risk factors, I will order coronary calcium scan, treadmill exercise stress test. I will also have cardiac echo done for systolic murmur at [facility]. Will see her in 6 weeks after the tests are done.    Chelsey Prows MD, [facility]4/02/2022, 10:52 AM Office: [phone] 12/21/2021:  Normal LV systolic function with visual EF 60-65%. Left ventricle cavity is normal in size. Normal left ventricular wall thickness. Normal global wall motion. Normal diastolic filling pattern, normal LAP.  No significant valvular abnormalities.  Echo density noted within the right atrium in the RV inflow and subcostal view (DDx prominent eustachian valve, artifact due to echogenic tricuspid annulus, etc). Images attached.  Consider either transesophageal echocardiogram, cardiac MRI, or limited echo with Definity for further evaluation. Clinical correlation needed.  No prior study for comparison.   Exercise treadmill stress test 01/19/2022: Exercise treadmill stress test performed using Bruce protocol.  Patient reached 10.3 METS, and 90% of age predicted maximum heart rate.  Exercise capacity was excellent. No chest pain reported.  Normal heart rate and hemodynamic response. Stress EKG revealed no ischemic changes. Low risk study.OTHER FINDINGS:    CT chest report: Heart is normal size. Aorta normal caliber. Single punctate calcification seen in the distal aortic arch. No adenopathy. No confluent opacities or  effusions. No acute findings in the upper abdomen. Chest wall soft tissues are unremarkable. No acute bony abnormality.   IMPRESSION: No visible coronary artery calcifications. Total coronary calcium score of 0.   Single calcification seen in the distal aortic arch.   No acute extra cardiac abnormality.     Electronically Signed   By: Chelsey Hunt M.D.   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  HPI   Prior to Admission medications   Medication Sig Start Date End Date Taking? Authorizing Provider  acetaminophen (TYLENOL) 325 MG tablet Take 650 mg by mouth every 6 (six) hours as needed for mild pain.   Yes [provider]  co-enzyme Q-10 30 MG capsule Take 200 mg by mouth 3 (three) times daily.   Yes [provider]  cyclobenzaprine (FLEXERIL) 5 MG tablet Take 1 tablet (5 mg total) by mouth 3 (three) times daily as needed for muscle spasms. 01/23/22  Yes Hunt, Chelsey R, DO  dicyclomine (BENTYL) 10 MG capsule Take 1 capsule (10 mg total) by mouth 3 (three) times daily as needed. 08/24/21  Yes   famotidine (PEPCID) 40 MG tablet Take 1 tablet (40 mg total) by mouth daily. 10/12/21  Yes Hunt, Chelsey Lick, MD  ibuprofen (ADVIL,MOTRIN) 200 MG tablet Take 200 mg by mouth every 8 (eight) hours as needed.    Yes [provider]  levothyroxine (SYNTHROID) 75 MCG tablet Take 1 tablet (75 mcg total) by mouth daily. 08/02/21  Yes Jozlynn Hunt, Chelsey Bloomer, MD  pantoprazole (PROTONIX) 20 MG tablet Take 1 tablet (20 mg total) by mouth 2 (two) times daily  before a meal Take 30-45 mins prior to breakfast and dinner. 08/24/21  Yes   promethazine (PHENERGAN) 25 MG tablet Take 1 tablet (25 mg total) by mouth every 6 (six) hours as needed for nausea or vomiting. 03/28/21  Yes Hunt, Chelsey R, DO  Ubrogepant (UBRELVY) 100 MG TABS Take 1 tablet (100 mg) by mouth as needed. May repeat dose in 2 hours.  Maximum 2 tablets in 24 hours. 01/23/22  Yes Tomi Likens, Chelsey R, DO  valACYclovir (VALTREX) 1000 MG tablet Take 1 tablet (1,000 mg total) by mouth every 12 (twelve) hours for 5 days 09/25/21  Yes   dicyclomine (BENTYL) 10 MG capsule Take 10 mg by mouth. 05/23/16 01/26/22  [provider]    Allergies  Allergen Reactions   Imitrex [Sumatriptan] Palpitations   Reglan [Metoclopramide] Palpitations   Rosuvastatin Nausea Only   Propoxyphene N-Acetaminophen    Nabumetone Rash    REACTION: GI upset   Topiramate Rash    REACTION: rash    Patient Active Problem List   Diagnosis Date Noted   Family history of heart disease 11/15/2021   Obstructive sleep apnea 09/07/2021   Prediabetes  09/07/2021   Dyslipidemia 08/14/2021   Positive ANA (antinuclear antibody) 04/20/2021   Finger deformity 04/20/2021   Raynaud phenomenon 09/17/2019   Anxiety 07/30/2019   Chronic idiopathic constipation 01/08/2019   Psychophysiological insomnia 07/07/2018   Seasonal allergic rhinitis due to pollen 08/08/2017   Intractable migraine without aura and without status migrainosus 07/02/2016   Pancreas divisum of native pancreas 12/27/2014   Uterine leiomyoma 09/26/2012   GERD (gastroesophageal reflux disease)    Hypothyroidism 08/12/2009    Past Medical History:  Diagnosis Date   ACID REFLUX DISEASE 01/03/2008   Anemia    ANEMIA, IRON DEFICIENCY 12/23/2008   history   Anxiety    Chronic tension type headache 01/13/2009   migraines   Complication of anesthesia    pt states she just needs a small amount or she will sleep too long   Dysplasia of cervix, low grade (CIN 1)     Fibroid    GERD (gastroesophageal reflux disease)    HEART MURMUR, SYSTOLIC 27/02/2375   will have echo 06/18/2013   HYPOTHYROIDISM, POST-RADIATION 08/12/2009   Irritable bowel syndrome 12/23/2008   Migraine    Ovarian cyst    Ovarian cyst    THYROID NODULE, RIGHT 04/21/2009    Past Surgical History:  Procedure Laterality Date   ABDOMINAL HYSTERECTOMY     COLONOSCOPY     HYSTEROSCOPY     LAPAROSCOPY     NOVASURE ABLATION     POLYPECTOMY     TUBAL LIGATION     UPPER GI ENDOSCOPY     VAGINAL HYSTERECTOMY N/A 06/24/2013   Procedure: TOTAL HYSTERECTOMY VAGINAL;  Surgeon: Alwyn Pea, MD;  Location: [facility];  Service: Gynecology;  Laterality: N/A;    Social History   Socioeconomic History   Marital status: Married    Spouse name: Shanon Brow   Number of children: 3   Years of education: Not on file   Highest education level: Associate degree: occupational, Hotel manager, or vocational program  Occupational History   Occupation: WOMENS    Employer: [employer]  Tobacco Use   Smoking status: Never   Smokeless tobacco: Never  Vaping Use   Vaping Use: Never used  Substance and Sexual Activity   Alcohol use: No   Drug use: No   Sexual activity: Yes    Birth control/protection: Surgical    Comment: BTL  Other Topics Concern   Not on file  Social History Narrative   Regular exercise-yes      Patient is right-handed.She lives with her husband in a 1 story home. She has recently been avoiding caffeine. Walks.   Social Determinants of Health   Financial Resource Strain: Not on file  Food Insecurity: No Food Insecurity (06/28/2021)   Hunger Vital Sign    Worried About Running Out of Food in the Last Year: Never true    Ran Out of Food in the Last Year: Never true  Transportation Needs: Not on file  Physical Activity: Not on file  Stress: Not on file  Social Connections: Not on file  Intimate Partner Violence: Not on file    Family History  Problem Relation Age of Onset    Heart disease Mother    Thyroid disease Mother    Heart attack Mother    Cancer Father    Heart disease Father    Heart attack Father    Throat cancer Father    Lung cancer Father    Alcoholism Father    Berenice Primas' disease Sister  Asthma Sister    Hepatitis Sister    Stomach cancer Sister    CVA Brother    Alzheimer's disease Brother    Goiter Brother    Hypertension Brother    Hypertension Brother    Alcoholism Brother    Hyperlipidemia Maternal Aunt    Hypertension Paternal Grandfather    Healthy Daughter    GER disease Son    GER disease Son    Goiter Other        Siblings 2   Depression Neg Hx    Alcohol abuse Neg Hx    Drug abuse Neg Hx    Early death Neg Hx    Hearing loss Neg Hx    Kidney disease Neg Hx    Stroke Neg Hx      Review of Systems  Constitutional: Negative.  Negative for chills and fever.  HENT: Negative.  Negative for congestion and sore throat.   Respiratory: Negative.  Negative for cough and shortness of breath.   Cardiovascular: Negative.  Negative for chest pain and palpitations.  Gastrointestinal:  Positive for constipation and heartburn. Negative for abdominal pain, blood in stool, melena, nausea and vomiting.  Genitourinary: Negative.   Skin: Negative.  Negative for rash.  Neurological:  Negative for dizziness and headaches.  All other systems reviewed and are negative.   Today's Vitals   03/08/22 0801  BP: 124/72  Pulse: 70  Temp: 98 F (36.7 C)  TempSrc: Oral  SpO2: 99%  Weight: 132 lb (59.9 kg)  Height: 5' (1.524 m)   Body mass index is 25.78 kg/m. Wt Readings from Last 3 Encounters:  03/08/22 132 lb (59.9 kg)  01/26/22 127 lb 12.8 oz (58 kg)  01/23/22 130 lb 12.8 oz (59.3 kg)    Physical Exam Vitals reviewed.  Constitutional:      Appearance: Normal appearance.  HENT:     Head: Normocephalic.     Mouth/Throat:     Mouth: Mucous membranes are moist.     Pharynx: Oropharynx is clear.  Eyes:     Extraocular  Movements: Extraocular movements intact.     Conjunctiva/sclera: Conjunctivae normal.     Pupils: Pupils are equal, round, and reactive to light.  Cardiovascular:     Rate and Rhythm: Normal rate and regular rhythm.     Pulses: Normal pulses.     Heart sounds: Normal heart sounds.  Pulmonary:     Effort: Pulmonary effort is normal.     Breath sounds: Normal breath sounds.  Abdominal:     General: There is no distension.     Palpations: Abdomen is soft.     Tenderness: There is no abdominal tenderness.  Musculoskeletal:        General: Normal range of motion.     Cervical back: No tenderness.     Right lower leg: No edema.     Left lower leg: No edema.  Lymphadenopathy:     Cervical: No cervical adenopathy.  Skin:    General: Skin is warm and dry.     Capillary Refill: Capillary refill takes less than 2 seconds.  Neurological:     General: No focal deficit present.     Mental Status: She is alert and oriented to person, place, and time.  Psychiatric:        Mood and Affect: Mood normal.        Behavior: Behavior normal.     ASSESSMENT & PLAN: A total of 49 minutes was spent with  the patient and counseling/coordination of care regarding preparing for this visit, review of most recent office visit notes, review of most recent office visit notes with different specialists, review of most recent blood work results, review of multiple chronic medical conditions and their management, review of all medications, education on nutrition, prognosis, documentation, and need for follow-up.  Problem List Items Addressed This Visit       Cardiovascular and Mediastinum   Intractable migraine without aura and without status migrainosus    Well-controlled.  Stable.  Lorrin Goodell as needed.        Respiratory   Obstructive sleep apnea    Well-controlled on CPAP treatment.        Digestive   GERD (gastroesophageal reflux disease)    Well-controlled.  Continue pantoprazole 20 mg twice  a day and famotidine 40 mg at bedtime.      Chronic idiopathic constipation     Endocrine   Hypothyroidism    Clinically euthyroid.  TSH done today.  Continue Synthroid 75 mcg daily.      Relevant Orders   TSH     Other   Dyslipidemia - Primary   Relevant Orders   Comprehensive metabolic panel   CBC with Differential/Platelet   Lipid panel   Prediabetes    Diet and nutrition discussed.  Advised to decrease amount of daily carbohydrate intake.      Relevant Orders   Comprehensive metabolic panel   Hemoglobin A1c   Family history of heart disease    Unremarkable and normal extensive recent cardiac work-up.      Other Visit Diagnoses     Statin intolerance          Patient Instructions  Health Maintenance, Female Adopting a healthy lifestyle and getting preventive care are important in promoting health and wellness. Ask your health care provider about: The right schedule for you to have regular tests and exams. Things you can do on your own to prevent diseases and keep yourself healthy. What should I know about diet, weight, and exercise? Eat a healthy diet  Eat a diet that includes plenty of vegetables, fruits, low-fat dairy products, and lean protein. Do not eat a lot of foods that are high in solid fats, added sugars, or sodium. Maintain a healthy weight Body mass index (BMI) is used to identify weight problems. It estimates body fat based on height and weight. Your health care provider can help determine your BMI and help you achieve or maintain a healthy weight. Get regular exercise Get regular exercise. This is one of the most important things you can do for your health. Most adults should: Exercise for at least 150 minutes each week. The exercise should increase your heart rate and make you sweat (moderate-intensity exercise). Do strengthening exercises at least twice a week. This is in addition to the moderate-intensity exercise. Spend less time sitting. Even  light physical activity can be beneficial. Watch cholesterol and blood lipids Have your blood tested for lipids and cholesterol at 57 years of age, then have this test every 5 years. Have your cholesterol levels checked more often if: Your lipid or cholesterol levels are high. You are older than 57 years of age. You are at high risk for heart disease. What should I know about cancer screening? Depending on your health history and family history, you may need to have cancer screening at various ages. This may include screening for: Breast cancer. Cervical cancer. Colorectal cancer. Skin cancer. Lung cancer. What should  I know about heart disease, diabetes, and high blood pressure? Blood pressure and heart disease High blood pressure causes heart disease and increases the risk of stroke. This is more likely to develop in people who have high blood pressure readings or are overweight. Have your blood pressure checked: Every 3-5 years if you are 51-75 years of age. Every year if you are 16 years old or older. Diabetes Have regular diabetes screenings. This checks your fasting blood sugar level. Have the screening done: Once every three years after age 48 if you are at a normal weight and have a low risk for diabetes. More often and at a younger age if you are overweight or have a high risk for diabetes. What should I know about preventing infection? Hepatitis B If you have a higher risk for hepatitis B, you should be screened for this virus. Talk with your health care provider to find out if you are at risk for hepatitis B infection. Hepatitis C Testing is recommended for: Everyone born from [birth year] through 1966. Anyone with known risk factors for hepatitis C. Sexually transmitted infections (STIs) Get screened for STIs, including gonorrhea and chlamydia, if: You are sexually active and are younger than 57 years of age. You are older than 57 years of age and your health care provider tells  you that you are at risk for this type of infection. Your sexual activity has changed since you were last screened, and you are at increased risk for chlamydia or gonorrhea. Ask your health care provider if you are at risk. Ask your health care provider about whether you are at high risk for HIV. Your health care provider may recommend a prescription medicine to help prevent HIV infection. If you choose to take medicine to prevent HIV, you should first get tested for HIV. You should then be tested every 3 months for as long as you are taking the medicine. Pregnancy If you are about to stop having your period (premenopausal) and you may become pregnant, seek counseling before you get pregnant. Take 400 to 800 micrograms (mcg) of folic acid every day if you become pregnant. Ask for birth control (contraception) if you want to prevent pregnancy. Osteoporosis and menopause Osteoporosis is a disease in which the bones lose minerals and strength with aging. This can result in bone fractures. If you are 66 years old or older, or if you are at risk for osteoporosis and fractures, ask your health care provider if you should: Be screened for bone loss. Take a calcium or vitamin D supplement to lower your risk of fractures. Be given hormone replacement therapy (HRT) to treat symptoms of menopause. Follow these instructions at home: Alcohol use Do not drink alcohol if: Your health care provider tells you not to drink. You are pregnant, may be pregnant, or are planning to become pregnant. If you drink alcohol: Limit how much you have to: 0-1 drink a day. Know how much alcohol is in your drink. In the [place] one drink equals one 12 oz bottle of beer (355 mL), one 5 oz glass of wine (148 mL), or one 1 oz glass of hard liquor (44 mL). Lifestyle Do not use any products that contain nicotine or tobacco. These products include cigarettes, chewing tobacco, and vaping devices, such as e-cigarettes. If you need help  quitting, ask your health care provider. Do not use street drugs. Do not share needles. Ask your health care provider for help if you need support or information about quitting  drugs. General instructions Schedule regular health, dental, and eye exams. Stay current with your vaccines. Tell your health care provider if: You often feel depressed. You have ever been abused or do not feel safe at home. Summary Adopting a healthy lifestyle and getting preventive care are important in promoting health and wellness. Follow your health care provider's instructions about healthy diet, exercising, and getting tested or screened for diseases. Follow your health care provider's instructions on monitoring your cholesterol and blood pressure. This information is not intended to replace advice given to you by your health care provider. Make sure you discuss any questions you have with your health care provider. Document Revised: 01/16/2021 Document Reviewed: 01/16/2021 Elsevier Patient Education  [place], MD [facility]

## 2022-03-08 NOTE — Assessment & Plan Note (Signed)
Clinically euthyroid.  TSH done today. Continue Synthroid 75 mcg daily. 

## 2022-03-09 ENCOUNTER — Other Ambulatory Visit (HOSPITAL_COMMUNITY): Payer: Self-pay

## 2022-03-19 DIAGNOSIS — Z0279 Encounter for issue of other medical certificate: Secondary | ICD-10-CM

## 2022-03-21 NOTE — Progress Notes (Signed)
FMLA Filled out for Patient husband. Copies made and place in scan.

## 2022-04-03 ENCOUNTER — Encounter (HOSPITAL_COMMUNITY): Payer: Self-pay

## 2022-04-03 ENCOUNTER — Ambulatory Visit (HOSPITAL_COMMUNITY)
Admission: EM | Admit: 2022-04-03 | Discharge: 2022-04-03 | Disposition: A | Discharge status: Home / Self Care | Payer: 59 | Attending: Internal Medicine | Admitting: Internal Medicine

## 2022-04-03 DIAGNOSIS — M4722 Other spondylosis with radiculopathy, cervical region: Secondary | ICD-10-CM

## 2022-04-03 NOTE — ED Triage Notes (Signed)
Pt c/o lt side chest pain radiating to lt shoulder and to lt wrist for the past week off and on. States seen her cardiologist a few months ago for same.

## 2022-04-03 NOTE — ED Provider Notes (Signed)
Chelsey Hunt    CSN: [id number] Arrival date & time: 04/03/22  1721      History   Chief Complaint Chief Complaint  Patient presents with   Chest Pain    HPI Chelsey Hunt is a 56 y.o. female.   HPI Patient with a history of systolic murmur, anemia, acid reflux, degenerative cervical and lumbar disc disease, presents today for evaluation of one week of intermittent left sided neck pain, radiating down to left shoulder, into left to mid chest. Patient has been seen and worked up by cardiology for similar presentation of chest pain and had a full cardiac work up by Dr. Einar Gip who felt chest pain was likely MSK related. Current episode of distributed pain has been ongoing for one week. She is unable to tolerate NSAID and therefore has not attempted relief with any OTC medication. The pain is reproducible with rotating neck laterally to the right. She endorses pain radiating down anterior cervical neck muscle into the left shoulder, left clavicle, and mid chest.  In review of EMR patient has had MRI studies done on her thoracic spine and cervical spine both indicating chronic degenerative disc disease.  She denies any work-up by a spine specialist or orthopedic specialist.  She reports that she recently had the studies done under the care of her neurologist who was attempting to determine the source of her migraine headaches.  She endorses intermittent neck pain and has attempted to alleviate their symptoms with positional changes at nighttime and use a special pillow, however endorses intermittent neck pain. She was prescribed cyclobenzaprine for pain , although has not attempted taken since  symptoms began.  Past Medical History:  Diagnosis Date   ACID REFLUX DISEASE 01/03/2008   Anemia    ANEMIA, IRON DEFICIENCY 12/23/2008   history   Anxiety    Chronic tension type headache 01/13/2009   migraines   Complication of anesthesia    pt states she just needs a small amount or she  will sleep too long   Dysplasia of cervix, low grade (CIN 1)    Fibroid    GERD (gastroesophageal reflux disease)    HEART MURMUR, SYSTOLIC 12/87/8676   will have echo 06/18/2013   HYPOTHYROIDISM, POST-RADIATION 08/12/2009   Irritable bowel syndrome 12/23/2008   Migraine    Ovarian cyst    Ovarian cyst    THYROID NODULE, RIGHT 04/21/2009    Patient Active Problem List   Diagnosis Date Noted   Statin intolerance 03/08/2022   Family history of heart disease 11/15/2021   Obstructive sleep apnea 09/07/2021   Prediabetes 09/07/2021   Dyslipidemia 08/14/2021   Positive ANA (antinuclear antibody) 04/20/2021   Finger deformity 04/20/2021   Raynaud phenomenon 09/17/2019   Anxiety 07/30/2019   Chronic idiopathic constipation 01/08/2019   Psychophysiological insomnia 07/07/2018   Seasonal allergic rhinitis due to pollen 08/08/2017   Intractable migraine without aura and without status migrainosus 07/02/2016   Pancreas divisum of native pancreas 12/27/2014   Uterine leiomyoma 09/26/2012   GERD (gastroesophageal reflux disease)    Hypothyroidism 08/12/2009    Past Surgical History:  Procedure Laterality Date   ABDOMINAL HYSTERECTOMY     COLONOSCOPY     HYSTEROSCOPY     LAPAROSCOPY     NOVASURE ABLATION     POLYPECTOMY     TUBAL LIGATION     UPPER GI ENDOSCOPY     VAGINAL HYSTERECTOMY N/A 06/24/2013   Procedure: TOTAL HYSTERECTOMY VAGINAL;  Surgeon: Alwyn Pea, MD;  Location: [facility];  Service: Gynecology;  Laterality: N/A;    OB History     Gravida  3   Para  3   Term      Preterm      AB      Living  3      SAB      IAB      Ectopic      Multiple      Live Births  3            Home Medications    Prior to Admission medications   Medication Sig Start Date End Date Taking? Authorizing Provider  acetaminophen (TYLENOL) 325 MG tablet Take 650 mg by mouth every 6 (six) hours as needed for mild pain.    [provider]  co-enzyme Q-10 30  MG capsule Take 200 mg by mouth 3 (three) times daily.    [provider]  cyclobenzaprine (FLEXERIL) 5 MG tablet Take 1 tablet (5 mg total) by mouth 3 (three) times daily as needed for muscle spasms. 01/23/22   Pieter Partridge, DO  dicyclomine (BENTYL) 10 MG capsule Take 10 mg by mouth. 05/23/16 01/26/22  [provider]  dicyclomine (BENTYL) 10 MG capsule Take 1 capsule (10 mg total) by mouth 3 (three) times daily as needed. 08/24/21     famotidine (PEPCID) 40 MG tablet Take 1 tablet (40 mg total) by mouth daily. 10/12/21   Binnie Rail, MD  ibuprofen (ADVIL,MOTRIN) 200 MG tablet Take 200 mg by mouth every 8 (eight) hours as needed.     [provider]  levothyroxine (SYNTHROID) 75 MCG tablet Take 1 tablet (75 mcg total) by mouth daily. 08/02/21   Horald Pollen, MD  pantoprazole (PROTONIX) 20 MG tablet Take 1 tablet (20 mg total) by mouth 2 (two) times daily before a meal Take 30-45 mins prior to breakfast and dinner. 08/24/21     promethazine (PHENERGAN) 25 MG tablet Take 1 tablet (25 mg total) by mouth every 6 (six) hours as needed for nausea or vomiting. 03/28/21   Tomi Likens, Adam R, DO  Ubrogepant (UBRELVY) 100 MG TABS Take 1 tablet (100 mg) by mouth as needed. May repeat dose in 2 hours.  Maximum 2 tablets in 24 hours. 01/23/22   Pieter Partridge, DO  valACYclovir (VALTREX) 1000 MG tablet Take 1 tablet (1,000 mg total) by mouth every 12 (twelve) hours for 5 days 09/25/21       Family History Family History  Problem Relation Age of Onset   Heart disease Mother    Thyroid disease Mother    Heart attack Mother    Cancer Father    Heart disease Father    Heart attack Father    Throat cancer Father    Lung cancer Father    Alcoholism Father    Berenice Primas' disease Sister    Asthma Sister    Hepatitis Sister    Stomach cancer Sister    CVA Brother    Alzheimer's disease Brother    Goiter Brother    Hypertension Brother    Hypertension Brother    Alcoholism Brother     Hyperlipidemia Maternal Aunt    Hypertension Paternal Grandfather    Healthy Daughter    GER disease Son    GER disease Son    Goiter Other        Siblings 2   Depression Neg Hx    Alcohol abuse Neg Hx  Drug abuse Neg Hx    Early death Neg Hx    Hearing loss Neg Hx    Kidney disease Neg Hx    Stroke Neg Hx     Social History Social History   Tobacco Use   Smoking status: Never   Smokeless tobacco: Never  Vaping Use   Vaping Use: Never used  Substance Use Topics   Alcohol use: No   Drug use: No     Allergies   Imitrex [sumatriptan], Reglan [metoclopramide], Rosuvastatin, Propoxyphene n-acetaminophen, Nabumetone, and Topiramate   Review of Systems Review of Systems Pertinent negatives listed in HPI   Physical Exam Triage Vital Signs ED Triage Vitals [04/03/22 1731]  Enc Vitals Group     BP 123/69     Pulse Rate 85     Resp 18     Temp 98.4 F (36.9 C)     Temp Source Oral     SpO2 100 %     Weight      Height      Head Circumference      Peak Flow      Pain Score 7     Pain Loc      Pain Edu?      Excl. in [place]?    No data found.  Updated Vital Signs BP 123/69   Pulse 85   Temp 98.4 F (36.9 C) (Oral)   Resp 18   LMP 04/19/2013   SpO2 100%   Visual Acuity Right Eye Distance:   Left Eye Distance:   Bilateral Distance:    Right Eye Near:   Left Eye Near:    Bilateral Near:     Physical Exam Constitutional:      Appearance: She is well-developed.  HENT:     Head: Normocephalic.  Eyes:     Extraocular Movements: Extraocular movements intact.     Pupils: Pupils are equal, round, and reactive to light.  Cardiovascular:     Rate and Rhythm: Normal rate and regular rhythm.     Heart sounds: Murmur heard.  Pulmonary:     Effort: Pulmonary effort is normal.     Breath sounds: Normal breath sounds. No decreased air movement. No decreased breath sounds, wheezing or rhonchi.  Chest:    Musculoskeletal:     Cervical back: Normal range  of motion and neck supple. Pain with movement, spinous process tenderness and muscular tenderness present.  Neurological:     Mental Status: She is alert.  Psychiatric:        Attention and Perception: Attention and perception normal.        Mood and Affect: Mood and affect normal.        Speech: Speech normal.        Behavior: Behavior normal.        Thought Content: Thought content normal.      UC Treatments / Results  Labs (all labs ordered are listed, but only abnormal results are displayed) Labs Reviewed - No data to display  EKG NSR 88 BPM, no ST changes visible on tracing   Radiology No results found.  Procedures Procedures (including critical care time)  Medications Ordered in UC Medications - No data to display  Initial Impression / Assessment and Plan / UC Course  I have reviewed the triage vital signs and the nursing notes.  Pertinent labs & imaging results that were available during my care of the patient were reviewed by me and considered in my medical  decision making (see chart for details).    Cervical Radiculopathy related to DDD Resume Flexeril. Acetaminophen for acute pain. Heat applications as needed for pain . Follow-up with [facility]   Final Clinical Impressions(s) / UC Diagnoses   Final diagnoses:  Cervical radiculopathy due to degenerative joint disease of spine     Discharge Instructions      Resume taking your Flexeril for neck pain.  Recommend heat compresses with heating pad.  I have placed follow-up information for you to follow-up at [facility] for further evaluation of your neck pain. Suspect your symptoms are attributed to your chronic arthritis of neck. If at any point your symptoms worsen or do not improve or your chest pain becomes severe go immediately to the emergency department.     ED Prescriptions   None    PDMP not reviewed this encounter.   Scot Jun, [place] 04/04/22 [phone]

## 2022-04-03 NOTE — Discharge Instructions (Addendum)
Resume taking your Flexeril for neck pain.  Recommend heat compresses with heating pad.  I have placed follow-up information for you to follow-up at [facility] for further evaluation of your neck pain. Suspect your symptoms are attributed to your chronic arthritis of neck. If at any point your symptoms worsen or do not improve or your chest pain becomes severe go immediately to the emergency department.

## 2022-04-05 DIAGNOSIS — Z0279 Encounter for issue of other medical certificate: Secondary | ICD-10-CM

## 2022-04-09 NOTE — Progress Notes (Signed)
Office Visit Note  Patient: Chelsey Hunt             Date of Birth: [birth year]           MRN: [id number]             PCP: Horald Pollen, MD Referring: Horald Pollen, * Visit Date: 04/19/2022   Subjective:  Follow-up (Doing good)   History of Present Illness: Chelsey Hunt is a 56 y.o. female here for follow up for positive ANA and CCP antibody titers with multiple symptoms including fatigue, migraines, dry eyes.  Since last year she is not had any major new or progressing symptoms and is feeling pretty well today.  She has some environmental allergy type symptoms and sensitivity with very dry air and in the hospital.  She still experiences Raynaud's discoloration but infrequently with cold exposure in her fingertips.  Previous HPI 04/20/2021 Chelsey Hunt is a 56 y.o. female here for positive ANA and CCP antibody titers with multiple symptoms including fatigue, migraines, dry eyes. Neurology follow up and evaluations for chronic migraines including neuroimaging with diffuse white matter enhancements.  For joint involvement she has mild arthritic changes in the right hand especially deviation of the fifth finger though she states this has been longstanding and stable for years.  There has been some increased enlargement of the PIP joints she has pain mostly associated with use.  She feels chronically fatigued though attributes this in part to menopausal changes and also working third shift.  Chronic migraine headaches have been extensive and follows with neurology for this with repeated neuroimaging over time mostly nonspecific with diffuse white matter changes.  Ophthalmology exam has shown dry eyes with no inflammatory concerns reported.  She was prescribed eyedrops for this but did not like them currently she uses Visine as needed does not have recurrent problem with abrasion or infections.  She denies dry mouth or new dental issues.  She reports possible  frontotemporal hair thinning but no rashes or severe bald patches.  She has had a few episodes of painful pallor discoloration in the fingertips with cold exposure that resolved fully with rewarming.  She denies lymphadenopathy, pleurisy, photosensitive rashes, or history of blood clots. She has a strong family history of lupus with her sister and mother no personal previous diagnosis of an autoimmune condition.   Labs reviewed 03/2021 ANCA neg ESR 14 CRP neg   02/2021 ANA 1:320 speckled SSA, SSB neg RF neg CCP 55 ESR 40      Review of Systems  Constitutional:  Positive for fatigue.  HENT:  Positive for mouth dryness. Negative for mouth sores.   Eyes:  Negative for dryness.  Respiratory:  Negative for shortness of breath.   Cardiovascular:  Negative for chest pain and palpitations.  Gastrointestinal:  Positive for constipation. Negative for blood in stool and diarrhea.  Endocrine: Negative for increased urination.  Genitourinary:  Negative for involuntary urination.  Musculoskeletal:  Positive for joint pain, joint pain and morning stiffness. Negative for joint swelling, myalgias, muscle weakness, muscle tenderness and myalgias.  Skin:  Negative for color change, rash, hair loss and sensitivity to sunlight.  Allergic/Immunologic: Negative for susceptible to infections.  Neurological:  Positive for dizziness and headaches.  Hematological:  Negative for swollen glands.  Psychiatric/Behavioral:  Negative for depressed mood and sleep disturbance. The patient is not nervous/anxious.     PMFS History:  Patient Active Problem List   Diagnosis Date Noted   Cervical radiculopathy 04/10/2022  Statin intolerance 03/08/2022   Family history of heart disease 11/15/2021   Obstructive sleep apnea 09/07/2021   Prediabetes 09/07/2021   Dyslipidemia 08/14/2021   Positive ANA (antinuclear antibody) 04/20/2021   Finger deformity 04/20/2021   Raynaud phenomenon 09/17/2019   Anxiety  07/30/2019   Chronic idiopathic constipation 01/08/2019   Psychophysiological insomnia 07/07/2018   Seasonal allergic rhinitis due to pollen 08/08/2017   Intractable migraine without aura and without status migrainosus 07/02/2016   Pancreas divisum of native pancreas 12/27/2014   Uterine leiomyoma 09/26/2012   GERD (gastroesophageal reflux disease)    Hypothyroidism 08/12/2009    Past Medical History:  Diagnosis Date   ACID REFLUX DISEASE 01/03/2008   Anemia    ANEMIA, IRON DEFICIENCY 12/23/2008   history   Anxiety    Chronic tension type headache 01/13/2009   migraines   Complication of anesthesia    pt states she just needs a small amount or she will sleep too long   Dysplasia of cervix, low grade (CIN 1)    Fibroid    GERD (gastroesophageal reflux disease)    HEART MURMUR, SYSTOLIC 44/09/270   will have echo 06/18/2013   HYPOTHYROIDISM, POST-RADIATION 08/12/2009   Irritable bowel syndrome 12/23/2008   Migraine    Ovarian cyst    Ovarian cyst    THYROID NODULE, RIGHT 04/21/2009    Family History  Problem Relation Age of Onset   Heart disease Mother    Thyroid disease Mother    Heart attack Mother    Cancer Father    Heart disease Father    Heart attack Father    Throat cancer Father    Lung cancer Father    Alcoholism Father    Graves' disease Sister    Asthma Sister    Hepatitis Sister    Stomach cancer Sister    CVA Brother    Alzheimer's disease Brother    Goiter Brother    Hypertension Brother    Hypertension Brother    Alcoholism Brother    Hyperlipidemia Maternal Aunt    Hypertension Paternal Grandfather    Healthy Daughter    GER disease Son    GER disease Son    Goiter Other        Siblings 2   Depression Neg Hx    Alcohol abuse Neg Hx    Drug abuse Neg Hx    Early death Neg Hx    Hearing loss Neg Hx    Kidney disease Neg Hx    Stroke Neg Hx    Past Surgical History:  Procedure Laterality Date   ABDOMINAL HYSTERECTOMY     COLONOSCOPY      HYSTEROSCOPY     LAPAROSCOPY     NOVASURE ABLATION     POLYPECTOMY     TUBAL LIGATION     UPPER GI ENDOSCOPY     VAGINAL HYSTERECTOMY N/A 06/24/2013   Procedure: TOTAL HYSTERECTOMY VAGINAL;  Surgeon: Alwyn Pea, MD;  Location: [facility];  Service: Gynecology;  Laterality: N/A;   Social History   Social History Narrative   Regular exercise-yes      Patient is right-handed.She lives with her husband in a 1 story home. She has recently been avoiding caffeine. Walks.   Immunization History  Administered Date(s) Administered   Hepatitis A, Adult 02/17/2014   Influenza Whole 06/19/2012, 06/15/2013   Influenza,inj,Quad PF,6+ Mos 06/09/2016, 06/10/2017, 06/13/2018, 07/07/2019   Influenza-Unspecified 05/12/2015   Td 12/29/2009   Tdap 03/07/2020  Objective: Vital Signs: BP 115/74 (BP Location: Left Arm, Patient Position: Sitting, Cuff Size: Normal)   Pulse 68   Resp 14   Ht 5' (1.524 m)   Wt 134 lb (60.8 kg)   LMP 04/19/2013   BMI 26.17 kg/m    Physical Exam HENT:     Mouth/Throat:     Mouth: Mucous membranes are moist.     Pharynx: Oropharynx is clear.  Eyes:     Conjunctiva/sclera: Conjunctivae normal.  Cardiovascular:     Rate and Rhythm: Normal rate and regular rhythm.  Pulmonary:     Effort: Pulmonary effort is normal.     Breath sounds: Normal breath sounds.  Musculoskeletal:     Right lower leg: No edema.     Left lower leg: No edema.  Lymphadenopathy:     Cervical: No cervical adenopathy.  Skin:    General: Skin is warm and dry.     Findings: No rash.  Neurological:     Mental Status: She is alert.  Psychiatric:        Mood and Affect: Mood normal.      Musculoskeletal Exam:  Shoulders full ROM no tenderness or swelling Elbows full ROM no tenderness or swelling Wrists full ROM no tenderness or swelling Fingers of the right hand with mild bony PIP joint enlargement and some lateral deviation at the fifth finger, slightly decreased extension range  of motion associated, no tenderness or palpable synovitis, and left hand appears normal Knees full ROM no tenderness or swelling Ankles full ROM no tenderness or swelling   Investigation: No additional findings.  Imaging: No results found.  Recent Labs: Lab Results  Component Value Date   WBC 4.7 03/08/2022   HGB 13.3 03/08/2022   PLT 351.0 03/08/2022   NA 136 03/08/2022   K 4.2 03/08/2022   CL 102 03/08/2022   CO2 28 03/08/2022   GLUCOSE 87 03/08/2022   BUN 14 03/08/2022   CREATININE 1.03 03/08/2022   BILITOT 0.4 03/08/2022   ALKPHOS 107 03/08/2022   AST 17 03/08/2022   ALT 15 03/08/2022   PROT 7.8 03/08/2022   ALBUMIN 4.4 03/08/2022   CALCIUM 10.1 03/08/2022   GFRAA >60 07/06/2019    Speciality Comments: No specialty comments available.  Procedures:  No procedures performed Allergies: Imitrex [sumatriptan], Reglan [metoclopramide], Rosuvastatin, Propoxyphene n-acetaminophen, Nabumetone, and Topiramate   Assessment / Plan:     Visit Diagnoses: Positive ANA (antinuclear antibody) - Positive ANA with Raynaud's, right PIP joint changes, otherwise chronic nonspecific symptoms.  - Plan: Anti-DNA antibody, double-stranded, C3 and C4, Sedimentation rate  With observation so far no obvious progression of inflammatory or connective tissue disease symptoms.  We will recheck markers for disease activity with sedimentation rate double-stranded ENA and serum complements.  Assuming no substantial worsening would not required scheduled follow-up at this time.  Hand cramps Deviation of finger of right hand Finger deformity  Symptoms doing well no obvious progression of joint changes or hand function.  Not complaining of associated pain or major stiffness at this time.  Raynaud's phenomenon without gangrene  Mild fairly infrequent symptoms described no evidence of ischemic injury or inflammation secondary to this.  Reviewed just maintaining peripheral and core body  temperature.   Orders: Orders Placed This Encounter  Procedures   Anti-DNA antibody, double-stranded   C3 and C4   Sedimentation rate   No orders of the defined types were placed in this encounter.    Follow-Up Instructions: Return if symptoms worsen  or fail to improve.   Collier Salina, MD  Note - This record has been created using [employer].  Chart creation errors have been sought, but may not always  have been located. Such creation errors do not reflect on  the standard of medical care.

## 2022-04-09 NOTE — Progress Notes (Signed)
FMLA for patient filled out and faxed over.  Copy for patient at front desk.  LMOVM for patient to pick up.

## 2022-04-10 ENCOUNTER — Encounter: Payer: Self-pay | Admitting: Emergency Medicine

## 2022-04-10 ENCOUNTER — Other Ambulatory Visit (HOSPITAL_COMMUNITY): Payer: Self-pay

## 2022-04-10 ENCOUNTER — Ambulatory Visit: Payer: 59 | Admitting: Emergency Medicine

## 2022-04-10 VITALS — BP 128/70 | HR 78 | Temp 98.6°F | Ht 60.0 in | Wt 134.5 lb

## 2022-04-10 DIAGNOSIS — M5412 Radiculopathy, cervical region: Secondary | ICD-10-CM | POA: Insufficient documentation

## 2022-04-10 NOTE — Assessment & Plan Note (Signed)
Clinically stable.  No abnormal findings. No abnormal results from ER visit on 04/03/2022. Slowly getting better. No red flag signs or symptoms.

## 2022-04-10 NOTE — Progress Notes (Signed)
Chelsey Hunt 56 y.o.   Chief Complaint  Patient presents with   swollen left arm     Upper arm swollen     HISTORY OF PRESENT ILLNESS: This is a 56 y.o. female complaining of mild swelling to left upper arm with occasional abnormal sensation for the last 2 weeks.  Slowly getting better.  No other associated symptoms.  Seen in the emergency room last week with normal work-up. Diagnosis: Cervical radiculopathy.  HPI   Prior to Admission medications   Medication Sig Start Date End Date Taking? Authorizing Provider  acetaminophen (TYLENOL) 325 MG tablet Take 650 mg by mouth every 6 (six) hours as needed for mild pain.   Yes [provider]  co-enzyme Q-10 30 MG capsule Take 200 mg by mouth 3 (three) times daily.   Yes [provider]  cyclobenzaprine (FLEXERIL) 5 MG tablet Take 1 tablet (5 mg total) by mouth 3 (three) times daily as needed for muscle spasms. 01/23/22  Yes Jaffe, Adam R, DO  dicyclomine (BENTYL) 10 MG capsule Take 1 capsule (10 mg total) by mouth 3 (three) times daily as needed. 08/24/21  Yes   famotidine (PEPCID) 40 MG tablet Take 1 tablet (40 mg total) by mouth daily. 10/12/21  Yes Burns, Claudina Lick, MD  ibuprofen (ADVIL,MOTRIN) 200 MG tablet Take 200 mg by mouth every 8 (eight) hours as needed.    Yes [provider]  levothyroxine (SYNTHROID) 75 MCG tablet Take 1 tablet (75 mcg total) by mouth daily. 08/02/21  Yes Marina Boerner, Ines Bloomer, MD  pantoprazole (PROTONIX) 20 MG tablet Take 1 tablet (20 mg total) by mouth 2 (two) times daily before a meal Take 30-45 mins prior to breakfast and dinner. 08/24/21  Yes   promethazine (PHENERGAN) 25 MG tablet Take 1 tablet (25 mg total) by mouth every 6 (six) hours as needed for nausea or vomiting. 03/28/21  Yes Jaffe, Adam R, DO  Ubrogepant (UBRELVY) 100 MG TABS Take 1 tablet (100 mg) by mouth as needed. May repeat dose in 2 hours.  Maximum 2 tablets in 24 hours. 01/23/22  Yes Tomi Likens, Adam R, DO  valACYclovir  (VALTREX) 1000 MG tablet Take 1 tablet (1,000 mg total) by mouth every 12 (twelve) hours for 5 days 09/25/21  Yes   dicyclomine (BENTYL) 10 MG capsule Take 10 mg by mouth. 05/23/16 01/26/22  [provider]    Allergies  Allergen Reactions   Imitrex [Sumatriptan] Palpitations   Reglan [Metoclopramide] Palpitations   Rosuvastatin Nausea Only   Propoxyphene N-Acetaminophen    Nabumetone Rash    REACTION: GI upset   Topiramate Rash    REACTION: rash    Patient Active Problem List   Diagnosis Date Noted   Statin intolerance 03/08/2022   Family history of heart disease 11/15/2021   Obstructive sleep apnea 09/07/2021   Prediabetes 09/07/2021   Dyslipidemia 08/14/2021   Positive ANA (antinuclear antibody) 04/20/2021   Finger deformity 04/20/2021   Raynaud phenomenon 09/17/2019   Anxiety 07/30/2019   Chronic idiopathic constipation 01/08/2019   Psychophysiological insomnia 07/07/2018   Seasonal allergic rhinitis due to pollen 08/08/2017   Intractable migraine without aura and without status migrainosus 07/02/2016   Pancreas divisum of native pancreas 12/27/2014   Uterine leiomyoma 09/26/2012   GERD (gastroesophageal reflux disease)    Hypothyroidism 08/12/2009    Past Medical History:  Diagnosis Date   ACID REFLUX DISEASE 01/03/2008   Anemia    ANEMIA, IRON DEFICIENCY 12/23/2008   history   Anxiety  Chronic tension type headache 01/13/2009   migraines   Complication of anesthesia    pt states she just needs a small amount or she will sleep too long   Dysplasia of cervix, low grade (CIN 1)    Fibroid    GERD (gastroesophageal reflux disease)    HEART MURMUR, SYSTOLIC 75/91/6384   will have echo 06/18/2013   HYPOTHYROIDISM, POST-RADIATION 08/12/2009   Irritable bowel syndrome 12/23/2008   Migraine    Ovarian cyst    Ovarian cyst    THYROID NODULE, RIGHT 04/21/2009    Past Surgical History:  Procedure Laterality Date   ABDOMINAL HYSTERECTOMY     COLONOSCOPY      HYSTEROSCOPY     LAPAROSCOPY     NOVASURE ABLATION     POLYPECTOMY     TUBAL LIGATION     UPPER GI ENDOSCOPY     VAGINAL HYSTERECTOMY N/A 06/24/2013   Procedure: TOTAL HYSTERECTOMY VAGINAL;  Surgeon: Alwyn Pea, MD;  Location: [facility];  Service: Gynecology;  Laterality: N/A;    Social History   Socioeconomic History   Marital status: Married    Spouse name: Shanon Brow   Number of children: 3   Years of education: Not on file   Highest education level: Associate degree: occupational, Hotel manager, or vocational program  Occupational History   Occupation: WOMENS    Employer: [employer]  Tobacco Use   Smoking status: Never   Smokeless tobacco: Never  Vaping Use   Vaping Use: Never used  Substance and Sexual Activity   Alcohol use: No   Drug use: No   Sexual activity: Yes    Birth control/protection: Surgical    Comment: BTL  Other Topics Concern   Not on file  Social History Narrative   Regular exercise-yes      Patient is right-handed.She lives with her husband in a 1 story home. She has recently been avoiding caffeine. Walks.   Social Determinants of Health   Financial Resource Strain: Not on file  Food Insecurity: No Food Insecurity (06/28/2021)   Hunger Vital Sign    Worried About Running Out of Food in the Last Year: Never true    Ran Out of Food in the Last Year: Never true  Transportation Needs: Not on file  Physical Activity: Not on file  Stress: Not on file  Social Connections: Not on file  Intimate Partner Violence: Not on file    Family History  Problem Relation Age of Onset   Heart disease Mother    Thyroid disease Mother    Heart attack Mother    Cancer Father    Heart disease Father    Heart attack Father    Throat cancer Father    Lung cancer Father    Alcoholism Father    Berenice Primas' disease Sister    Asthma Sister    Hepatitis Sister    Stomach cancer Sister    CVA Brother    Alzheimer's disease Brother    Goiter Brother    Hypertension  Brother    Hypertension Brother    Alcoholism Brother    Hyperlipidemia Maternal Aunt    Hypertension Paternal Grandfather    Healthy Daughter    GER disease Son    GER disease Son    Goiter Other        Siblings 2   Depression Neg Hx    Alcohol abuse Neg Hx    Drug abuse Neg Hx    Early death Neg Hx  Hearing loss Neg Hx    Kidney disease Neg Hx    Stroke Neg Hx      Review of Systems  Constitutional: Negative.  Negative for chills and fever.  HENT: Negative.  Negative for congestion and sore throat.   Respiratory: Negative.  Negative for cough and shortness of breath.   Cardiovascular: Negative.  Negative for chest pain and palpitations.  Gastrointestinal: Negative.  Negative for abdominal pain, diarrhea, nausea and vomiting.  Genitourinary: Negative.  Negative for dysuria and hematuria.  Musculoskeletal: Negative.   Skin: Negative.  Negative for rash.  Neurological: Negative.  Negative for dizziness and headaches.  All other systems reviewed and are negative.  Today's Vitals   04/10/22 1508  BP: 128/70  Pulse: 78  Temp: 98.6 F (37 C)  TempSrc: Oral  SpO2: 98%  Weight: 134 lb 8 oz (61 kg)  Height: 5' (1.524 m)   Body mass index is 26.27 kg/m.   Physical Exam Vitals reviewed.  Constitutional:      Appearance: Normal appearance.  HENT:     Head: Normocephalic.  Eyes:     Extraocular Movements: Extraocular movements intact.     Pupils: Pupils are equal, round, and reactive to light.  Cardiovascular:     Rate and Rhythm: Normal rate and regular rhythm.     Pulses: Normal pulses.     Heart sounds: Normal heart sounds.  Pulmonary:     Effort: Pulmonary effort is normal.     Breath sounds: Normal breath sounds.  Abdominal:     Palpations: Abdomen is soft.     Tenderness: There is no abdominal tenderness.  Musculoskeletal:     Cervical back: No tenderness.     Right lower leg: No edema.     Left lower leg: No edema.     Comments: Left upper extremity:  No erythema or swelling.  No tenderness.  No significant abnormal findings.  Neurovascularly intact.  Full range of motion.  Good strength.  Normal deep tendon reflexes.  Lymphadenopathy:     Cervical: No cervical adenopathy.  Skin:    General: Skin is warm and dry.     Capillary Refill: Capillary refill takes less than 2 seconds.  Neurological:     General: No focal deficit present.     Mental Status: She is alert and oriented to person, place, and time.  Psychiatric:        Mood and Affect: Mood normal.        Behavior: Behavior normal.      ASSESSMENT & PLAN: A total of 35 minutes was spent with the patient and counseling/coordination of care regarding preparing for this visit, review of most recent office visit notes, review of most recent emergency department visit on 04/03/2022, differential diagnosis including most likely possibility of cervical radiculopathy, review of most recent EKG and imaging results, prognosis, documentation, and need for follow-up.  Problem List Items Addressed This Visit       Nervous and Auditory   Cervical radiculopathy - Primary    Clinically stable.  No abnormal findings. No abnormal results from ER visit on 04/03/2022. Slowly getting better. No red flag signs or symptoms.       Patient Instructions  Cervical Radiculopathy  Cervical radiculopathy means that a nerve in the neck (a cervical nerve) is pinched or bruised. This can happen because of an injury to the cervical spine (vertebrae) in the neck, or as a normal part of getting older. This condition can cause pain  or loss of feeling (numbness) that runs from your neck all the way down to your arm and fingers. Often, this condition gets better with rest. Treatment may be needed if the condition does not get better. What are the causes? A neck injury. A bulging disk in your spine. Sudden muscle tightening (muscle spasms). Tight muscles in your neck due to overuse. Arthritis. Breakdown in  the bones and joints of the spine (spondylosis) due to getting older. Bone spurs that form near the nerves in the neck. What are the signs or symptoms? Pain. The pain may: Run from the neck to the arm and hand. Be very bad or irritating. Get worse when you move your neck. Loss of feeling or tingling in your arm or hand. Weakness in your arm or hand, in very bad cases. How is this treated? In many cases, treatment is not needed for this condition. With rest, the condition often gets better over time. If treatment is needed, options may include: Wearing a soft neck collar (cervical collar) for short periods of time. Doing exercises (physical therapy) to strengthen your neck muscles. Taking medicines. Having shots (injections) in your spine, in very bad cases. Having surgery. This may be needed if other treatments do not help. The type of surgery that is used will depend on the cause of your condition. Follow these instructions at home: If you have a soft neck collar: Wear it as told by your doctor. Take it off only as told by your doctor. Ask your doctor if you can take the collar off for cleaning and bathing. If you are allowed to take the collar off for cleaning or bathing: Follow instructions from your doctor about how to take off the collar safely. Clean the collar by wiping it with mild soap and water and drying it completely. Take out any removable pads in the collar every 1-2 days. Wash them by hand with soap and water. Let them air-dry completely before you put them back in the collar. Check your skin under the collar for redness or sores. If you see any, tell your doctor. Managing pain     Take over-the-counter and prescription medicines only as told by your doctor. If told, put ice on the painful area. To do this: If you have a soft neck collar, take if off as told by your doctor. Put ice in a plastic bag. Place a towel between your skin and the bag. Leave the ice on for 20  minutes, 2-3 times a day. Take off the ice if your skin turns bright red. This is very important. If you cannot feel pain, heat, or cold, you have a greater risk of damage to the area. If using ice does not help, you can try using heat. Use the heat source that your doctor recommends, such as a moist heat pack or a heating pad. Place a towel between your skin and the heat source. Leave the heat on for 20-30 minutes. Take off the heat if your skin turns bright red. This is very important. If you cannot feel pain, heat, or cold, you have a greater risk of getting burned. You may try a gentle neck and shoulder rub (massage). Activity Rest as needed. Return to your normal activities when your doctor says that it is safe. Do exercises as told by your doctor or physical therapist. You may have to avoid lifting. Ask your doctor how much you can safely lift. General instructions Use a flat pillow when you sleep.  Do not drive while wearing a soft neck collar. If you do not have a soft neck collar, ask your doctor if it is safe to drive while your neck heals. Ask your doctor if you should avoid driving or using machines while you are taking your medicine. Do not smoke or use any products that contain nicotine or tobacco. If you need help quitting, ask your doctor. Keep all follow-up visits. Contact a doctor if: Your condition does not get better with treatment. Get help right away if: Your pain gets worse and medicine does not help. You lose feeling or feel weak in your hand, arm, face, or leg. You have a high fever. Your neck is stiff. You cannot control when you poop or pee (have incontinence). You have trouble with walking, balance, or talking. Summary Cervical radiculopathy means that a nerve in the neck is pinched or bruised. A nerve can get pinched from a bulging disk, arthritis, an injury to the neck, or other causes. Symptoms include pain, tingling, or loss of feeling that goes from the  neck to the arm or hand. Weakness in your arm or hand can happen in very bad cases. Treatment may include resting, wearing a soft neck collar, and doing exercises. You might need to take medicines for pain. In very bad cases, shots or surgery may be needed. This information is not intended to replace advice given to you by your health care provider. Make sure you discuss any questions you have with your health care provider. Document Revised: 03/02/2021 Document Reviewed: 03/02/2021 Elsevier Patient Education  [place], MD [facility]

## 2022-04-10 NOTE — Patient Instructions (Signed)
Cervical Radiculopathy  Cervical radiculopathy means that a nerve in the neck (a cervical nerve) is pinched or bruised. This can happen because of an injury to the cervical spine (vertebrae) in the neck, or as a normal part of getting older. This condition can cause pain or loss of feeling (numbness) that runs from your neck all the way down to your arm and fingers. Often, this condition gets better with rest. Treatment may be needed if the condition does not get better. What are the causes? A neck injury. A bulging disk in your spine. Sudden muscle tightening (muscle spasms). Tight muscles in your neck due to overuse. Arthritis. Breakdown in the bones and joints of the spine (spondylosis) due to getting older. Bone spurs that form near the nerves in the neck. What are the signs or symptoms? Pain. The pain may: Run from the neck to the arm and hand. Be very bad or irritating. Get worse when you move your neck. Loss of feeling or tingling in your arm or hand. Weakness in your arm or hand, in very bad cases. How is this treated? In many cases, treatment is not needed for this condition. With rest, the condition often gets better over time. If treatment is needed, options may include: Wearing a soft neck collar (cervical collar) for short periods of time. Doing exercises (physical therapy) to strengthen your neck muscles. Taking medicines. Having shots (injections) in your spine, in very bad cases. Having surgery. This may be needed if other treatments do not help. The type of surgery that is used will depend on the cause of your condition. Follow these instructions at home: If you have a soft neck collar: Wear it as told by your doctor. Take it off only as told by your doctor. Ask your doctor if you can take the collar off for cleaning and bathing. If you are allowed to take the collar off for cleaning or bathing: Follow instructions from your doctor about how to take off the collar  safely. Clean the collar by wiping it with mild soap and water and drying it completely. Take out any removable pads in the collar every 1-2 days. Wash them by hand with soap and water. Let them air-dry completely before you put them back in the collar. Check your skin under the collar for redness or sores. If you see any, tell your doctor. Managing pain     Take over-the-counter and prescription medicines only as told by your doctor. If told, put ice on the painful area. To do this: If you have a soft neck collar, take if off as told by your doctor. Put ice in a plastic bag. Place a towel between your skin and the bag. Leave the ice on for 20 minutes, 2-3 times a day. Take off the ice if your skin turns bright red. This is very important. If you cannot feel pain, heat, or cold, you have a greater risk of damage to the area. If using ice does not help, you can try using heat. Use the heat source that your doctor recommends, such as a moist heat pack or a heating pad. Place a towel between your skin and the heat source. Leave the heat on for 20-30 minutes. Take off the heat if your skin turns bright red. This is very important. If you cannot feel pain, heat, or cold, you have a greater risk of getting burned. You may try a gentle neck and shoulder rub (massage). Activity Rest as needed. Return   to your normal activities when your doctor says that it is safe. Do exercises as told by your doctor or physical therapist. You may have to avoid lifting. Ask your doctor how much you can safely lift. General instructions Use a flat pillow when you sleep. Do not drive while wearing a soft neck collar. If you do not have a soft neck collar, ask your doctor if it is safe to drive while your neck heals. Ask your doctor if you should avoid driving or using machines while you are taking your medicine. Do not smoke or use any products that contain nicotine or tobacco. If you need help quitting, ask your  doctor. Keep all follow-up visits. Contact a doctor if: Your condition does not get better with treatment. Get help right away if: Your pain gets worse and medicine does not help. You lose feeling or feel weak in your hand, arm, face, or leg. You have a high fever. Your neck is stiff. You cannot control when you poop or pee (have incontinence). You have trouble with walking, balance, or talking. Summary Cervical radiculopathy means that a nerve in the neck is pinched or bruised. A nerve can get pinched from a bulging disk, arthritis, an injury to the neck, or other causes. Symptoms include pain, tingling, or loss of feeling that goes from the neck to the arm or hand. Weakness in your arm or hand can happen in very bad cases. Treatment may include resting, wearing a soft neck collar, and doing exercises. You might need to take medicines for pain. In very bad cases, shots or surgery may be needed. This information is not intended to replace advice given to you by your health care provider. Make sure you discuss any questions you have with your health care provider. Document Revised: 03/02/2021 Document Reviewed: 03/02/2021 Elsevier Patient Education  2023 [employer]  

## 2022-04-13 ENCOUNTER — Other Ambulatory Visit (HOSPITAL_COMMUNITY): Payer: Self-pay

## 2022-04-19 ENCOUNTER — Encounter: Payer: Self-pay | Admitting: Internal Medicine

## 2022-04-19 ENCOUNTER — Ambulatory Visit: Payer: 59 | Attending: Internal Medicine | Admitting: Internal Medicine

## 2022-04-19 VITALS — BP 115/74 | HR 68 | Resp 14 | Ht 60.0 in | Wt 134.0 lb

## 2022-04-19 DIAGNOSIS — R768 Other specified abnormal immunological findings in serum: Secondary | ICD-10-CM | POA: Diagnosis not present

## 2022-04-19 DIAGNOSIS — M20001 Unspecified deformity of right finger(s): Secondary | ICD-10-CM

## 2022-04-19 DIAGNOSIS — M20009 Unspecified deformity of unspecified finger(s): Secondary | ICD-10-CM

## 2022-04-19 DIAGNOSIS — R252 Cramp and spasm: Secondary | ICD-10-CM | POA: Diagnosis not present

## 2022-04-19 DIAGNOSIS — I7300 Raynaud's syndrome without gangrene: Secondary | ICD-10-CM

## 2022-04-20 LAB — C3 AND C4
C3 Complement: 160 mg/dL (ref 83–193)
C4 Complement: 33 mg/dL (ref 15–57)

## 2022-04-20 LAB — SEDIMENTATION RATE: Sed Rate: 14 mm/h (ref 0–30)

## 2022-04-20 LAB — ANTI-DNA ANTIBODY, DOUBLE-STRANDED: ds DNA Ab: <1 IU/mL

## 2022-04-22 NOTE — Progress Notes (Signed)
Lab results all look good. Her sedimentation rate also remains normal now off any kind of medications. I do not think we need to schedule follow up. She or Dr. Mitchel Honour can just contact us as needed if new concerns arise.

## 2022-04-23 ENCOUNTER — Ambulatory Visit: Payer: 59 | Admitting: Cardiology

## 2022-04-27 ENCOUNTER — Other Ambulatory Visit (HOSPITAL_COMMUNITY): Payer: Self-pay

## 2022-05-17 ENCOUNTER — Other Ambulatory Visit (HOSPITAL_COMMUNITY): Payer: Self-pay

## 2022-06-12 ENCOUNTER — Ambulatory Visit: Payer: 59 | Admitting: Emergency Medicine

## 2022-06-12 ENCOUNTER — Encounter: Payer: Self-pay | Admitting: Emergency Medicine

## 2022-06-12 VITALS — BP 126/84 | HR 83 | Temp 98.0°F | Ht 60.0 in | Wt 134.5 lb

## 2022-06-12 DIAGNOSIS — M542 Cervicalgia: Secondary | ICD-10-CM | POA: Diagnosis not present

## 2022-06-12 DIAGNOSIS — R7303 Prediabetes: Secondary | ICD-10-CM | POA: Diagnosis not present

## 2022-06-12 DIAGNOSIS — E039 Hypothyroidism, unspecified: Secondary | ICD-10-CM | POA: Diagnosis not present

## 2022-06-12 LAB — COMPREHENSIVE METABOLIC PANEL
ALT: 14 U/L (ref 0–35)
AST: 16 U/L (ref 0–37)
Albumin: 4.4 g/dL (ref 3.5–5.2)
Alkaline Phosphatase: 104 U/L (ref 39–117)
BUN: 13 mg/dL (ref 6–23)
CO2: 27 mEq/L (ref 19–32)
Calcium: 10.1 mg/dL (ref 8.4–10.5)
Chloride: 103 mEq/L (ref 96–112)
Creatinine, Ser: 0.97 mg/dL (ref 0.40–1.20)
GFR: 65.08 mL/min (ref >60.00)
Glucose, Bld: 92 mg/dL (ref 70–99)
Potassium: 4 mEq/L (ref 3.5–5.1)
Sodium: 139 mEq/L (ref 135–145)
Total Bilirubin: 0.3 mg/dL (ref 0.2–1.2)
Total Protein: 8 g/dL (ref 6.0–8.3)

## 2022-06-12 LAB — HEMOGLOBIN A1C: Hgb A1c MFr Bld: 6.1 % (ref 4.6–6.5)

## 2022-06-12 NOTE — Patient Instructions (Signed)

## 2022-06-12 NOTE — Assessment & Plan Note (Signed)
Clinically euthyroid.  TSH done today. Continue Synthroid 75 mcg daily. 

## 2022-06-12 NOTE — Assessment & Plan Note (Signed)
Stable.  Diet and nutrition discussed. Lab Results  Component Value Date   HGBA1C 6.0 03/08/2022

## 2022-06-12 NOTE — Assessment & Plan Note (Signed)
Much improved today.  Had muscle spasm with inflammation.  May take Tylenol and or Advil as needed. Continue using heat pad.

## 2022-06-12 NOTE — Progress Notes (Signed)
Chelsey Hunt 56 y.o.   Chief Complaint  Patient presents with   Shoulder Pain    Pain radiating from neck down left arm, pain is not getting better     HISTORY OF PRESENT ILLNESS: This is a 56 y.o. female complaining of left upper back pain radiating down the left arm for the past couple of days.  Much better today. No other associated symptoms. No other plaints or medical concerns today.  HPI   Prior to Admission medications   Medication Sig Start Date End Date Taking? Authorizing Provider  acetaminophen (TYLENOL) 325 MG tablet Take 650 mg by mouth every 6 (six) hours as needed for mild pain.    [provider]  co-enzyme Q-10 30 MG capsule Take 200 mg by mouth 3 (three) times daily.    [provider]  cyclobenzaprine (FLEXERIL) 5 MG tablet Take 1 tablet (5 mg total) by mouth 3 (three) times daily as needed for muscle spasms. 01/23/22   Pieter Partridge, DO  dicyclomine (BENTYL) 10 MG capsule Take 10 mg by mouth. 05/23/16 01/26/22  [provider]  dicyclomine (BENTYL) 10 MG capsule Take 1 capsule (10 mg total) by mouth 3 (three) times daily as needed. 08/24/21     famotidine (PEPCID) 40 MG tablet Take 1 tablet (40 mg total) by mouth daily. 10/12/21   Binnie Rail, MD  ibuprofen (ADVIL,MOTRIN) 200 MG tablet Take 200 mg by mouth every 8 (eight) hours as needed.  Patient not taking: Reported on 04/19/2022    [provider]  levothyroxine (SYNTHROID) 75 MCG tablet Take 1 tablet (75 mcg total) by mouth daily. 08/02/21   Horald Pollen, MD  pantoprazole (PROTONIX) 20 MG tablet Take 1 tablet (20 mg total) by mouth 2 (two) times daily before a meal Take 30-45 mins prior to breakfast and dinner. 08/24/21     promethazine (PHENERGAN) 25 MG tablet Take 1 tablet (25 mg total) by mouth every 6 (six) hours as needed for nausea or vomiting. 03/28/21   Tomi Likens, Adam R, DO  Ubrogepant (UBRELVY) 100 MG TABS Take 1 tablet (100 mg) by mouth as needed. May repeat  dose in 2 hours.  Maximum 2 tablets in 24 hours. 01/23/22   Pieter Partridge, DO  valACYclovir (VALTREX) 1000 MG tablet Take 1 tablet (1,000 mg total) by mouth every 12 (twelve) hours for 5 days 09/25/21       Allergies  Allergen Reactions   Imitrex [Sumatriptan] Palpitations   Reglan [Metoclopramide] Palpitations   Rosuvastatin Nausea Only   Propoxyphene N-Acetaminophen    Nabumetone Rash    REACTION: GI upset   Topiramate Rash    REACTION: rash    Patient Active Problem List   Diagnosis Date Noted   Cervical radiculopathy 04/10/2022   Statin intolerance 03/08/2022   Family history of heart disease 11/15/2021   Obstructive sleep apnea 09/07/2021   Prediabetes 09/07/2021   Dyslipidemia 08/14/2021   Positive ANA (antinuclear antibody) 04/20/2021   Finger deformity 04/20/2021   Raynaud phenomenon 09/17/2019   Anxiety 07/30/2019   Chronic idiopathic constipation 01/08/2019   Psychophysiological insomnia 07/07/2018   Seasonal allergic rhinitis due to pollen 08/08/2017   Intractable migraine without aura and without status migrainosus 07/02/2016   Pancreas divisum of native pancreas 12/27/2014   Uterine leiomyoma 09/26/2012   GERD (gastroesophageal reflux disease)    Hypothyroidism 08/12/2009    Past Medical History:  Diagnosis Date   ACID REFLUX DISEASE 01/03/2008   Anemia  ANEMIA, IRON DEFICIENCY 12/23/2008   history   Anxiety    Chronic tension type headache 01/13/2009   migraines   Complication of anesthesia    pt states she just needs a small amount or she will sleep too long   Dysplasia of cervix, low grade (CIN 1)    Fibroid    GERD (gastroesophageal reflux disease)    HEART MURMUR, SYSTOLIC 16/03/3709   will have echo 06/18/2013   HYPOTHYROIDISM, POST-RADIATION 08/12/2009   Irritable bowel syndrome 12/23/2008   Migraine    Ovarian cyst    Ovarian cyst    THYROID NODULE, RIGHT 04/21/2009    Past Surgical History:  Procedure Laterality Date   ABDOMINAL  HYSTERECTOMY     COLONOSCOPY     HYSTEROSCOPY     LAPAROSCOPY     NOVASURE ABLATION     POLYPECTOMY     TUBAL LIGATION     UPPER GI ENDOSCOPY     VAGINAL HYSTERECTOMY N/A 06/24/2013   Procedure: TOTAL HYSTERECTOMY VAGINAL;  Surgeon: Alwyn Pea, MD;  Location: [facility];  Service: Gynecology;  Laterality: N/A;    Social History   Socioeconomic History   Marital status: Married    Spouse name: Shanon Brow   Number of children: 3   Years of education: Not on file   Highest education level: Associate degree: occupational, Hotel manager, or vocational program  Occupational History   Occupation: WOMENS    Employer: [employer]  Tobacco Use   Smoking status: Never    Passive exposure: Past   Smokeless tobacco: Never  Vaping Use   Vaping Use: Never used  Substance and Sexual Activity   Alcohol use: No   Drug use: No   Sexual activity: Yes    Birth control/protection: Surgical    Comment: BTL  Other Topics Concern   Not on file  Social History Narrative   Regular exercise-yes      Patient is right-handed.She lives with her husband in a 1 story home. She has recently been avoiding caffeine. Walks.   Social Determinants of Health   Financial Resource Strain: Not on file  Food Insecurity: No Food Insecurity (06/28/2021)   Hunger Vital Sign    Worried About Running Out of Food in the Last Year: Never true    Ran Out of Food in the Last Year: Never true  Transportation Needs: Not on file  Physical Activity: Not on file  Stress: Not on file  Social Connections: Not on file  Intimate Partner Violence: Not on file    Family History  Problem Relation Age of Onset   Heart disease Mother    Thyroid disease Mother    Heart attack Mother    Cancer Father    Heart disease Father    Heart attack Father    Throat cancer Father    Lung cancer Father    Alcoholism Father    Berenice Primas' disease Sister    Asthma Sister    Hepatitis Sister    Stomach cancer Sister    CVA Brother     Alzheimer's disease Brother    Goiter Brother    Hypertension Brother    Hypertension Brother    Alcoholism Brother    Hyperlipidemia Maternal Aunt    Hypertension Paternal Grandfather    Healthy Daughter    GER disease Son    GER disease Son    Goiter Other        Siblings 2   Depression Neg Hx  Alcohol abuse Neg Hx    Drug abuse Neg Hx    Early death Neg Hx    Hearing loss Neg Hx    Kidney disease Neg Hx    Stroke Neg Hx      Review of Systems  Constitutional: Negative.  Negative for chills and fever.  HENT: Negative.  Negative for congestion and sore throat.   Respiratory: Negative.  Negative for cough and shortness of breath.   Cardiovascular: Negative.  Negative for chest pain and palpitations.  Gastrointestinal:  Negative for abdominal pain, diarrhea, nausea and vomiting.  Musculoskeletal:  Positive for neck pain.  Skin: Negative.  Negative for rash.  Neurological:  Negative for dizziness and headaches.  All other systems reviewed and are negative.  Today's Vitals   06/12/22 1425  BP: 126/84  Pulse: 83  Temp: 98 F (36.7 C)  TempSrc: Oral  SpO2: 97%  Weight: 134 lb 8 oz (61 kg)  Height: 5' (1.524 m)   Body mass index is 26.27 kg/m.   Physical Exam Vitals reviewed.  Constitutional:      Appearance: Normal appearance.  HENT:     Head: Normocephalic.     Mouth/Throat:     Mouth: Mucous membranes are moist.  Eyes:     Extraocular Movements: Extraocular movements intact.     Pupils: Pupils are equal, round, and reactive to light.  Cardiovascular:     Rate and Rhythm: Normal rate and regular rhythm.     Pulses: Normal pulses.     Heart sounds: Normal heart sounds.  Pulmonary:     Effort: Pulmonary effort is normal.     Breath sounds: Normal breath sounds.  Musculoskeletal:        General: Normal range of motion.     Cervical back: No tenderness.  Lymphadenopathy:     Cervical: No cervical adenopathy.  Skin:    General: Skin is warm and dry.   Neurological:     General: No focal deficit present.     Mental Status: She is alert and oriented to person, place, and time.  Psychiatric:        Mood and Affect: Mood normal.        Behavior: Behavior normal.      ASSESSMENT & PLAN: Problem List Items Addressed This Visit       Endocrine   Hypothyroidism    Clinically euthyroid. TSH done today. Continue Synthroid 75 mcg daily      Relevant Orders   Thyroid Panel With TSH     Other   Prediabetes    Stable.  Diet and nutrition discussed. Lab Results  Component Value Date   HGBA1C 6.0 03/08/2022         Relevant Orders   Comprehensive metabolic panel   Hemoglobin A1c   Musculoskeletal neck pain - Primary    Much improved today.  Had muscle spasm with inflammation.  May take Tylenol and or Advil as needed. Continue using heat pad.      Patient Instructions  Health Maintenance, Female Adopting a healthy lifestyle and getting preventive care are important in promoting health and wellness. Ask your health care provider about: The right schedule for you to have regular tests and exams. Things you can do on your own to prevent diseases and keep yourself healthy. What should I know about diet, weight, and exercise? Eat a healthy diet  Eat a diet that includes plenty of vegetables, fruits, low-fat dairy products, and lean protein. Do not eat  a lot of foods that are high in solid fats, added sugars, or sodium. Maintain a healthy weight Body mass index (BMI) is used to identify weight problems. It estimates body fat based on height and weight. Your health care provider can help determine your BMI and help you achieve or maintain a healthy weight. Get regular exercise Get regular exercise. This is one of the most important things you can do for your health. Most adults should: Exercise for at least 150 minutes each week. The exercise should increase your heart rate and make you sweat (moderate-intensity exercise). Do  strengthening exercises at least twice a week. This is in addition to the moderate-intensity exercise. Spend less time sitting. Even light physical activity can be beneficial. Watch cholesterol and blood lipids Have your blood tested for lipids and cholesterol at 57 years of age, then have this test every 5 years. Have your cholesterol levels checked more often if: Your lipid or cholesterol levels are high. You are older than 57 years of age. You are at high risk for heart disease. What should I know about cancer screening? Depending on your health history and family history, you may need to have cancer screening at various ages. This may include screening for: Breast cancer. Cervical cancer. Colorectal cancer. Skin cancer. Lung cancer. What should I know about heart disease, diabetes, and high blood pressure? Blood pressure and heart disease High blood pressure causes heart disease and increases the risk of stroke. This is more likely to develop in people who have high blood pressure readings or are overweight. Have your blood pressure checked: Every 3-5 years if you are 58-21 years of age. Every year if you are 64 years old or older. Diabetes Have regular diabetes screenings. This checks your fasting blood sugar level. Have the screening done: Once every three years after age 51 if you are at a normal weight and have a low risk for diabetes. More often and at a younger age if you are overweight or have a high risk for diabetes. What should I know about preventing infection? Hepatitis B If you have a higher risk for hepatitis B, you should be screened for this virus. Talk with your health care provider to find out if you are at risk for hepatitis B infection. Hepatitis C Testing is recommended for: Everyone born from [birth year] through 1966. Anyone with known risk factors for hepatitis C. Sexually transmitted infections (STIs) Get screened for STIs, including gonorrhea and chlamydia,  if: You are sexually active and are younger than 57 years of age. You are older than 57 years of age and your health care provider tells you that you are at risk for this type of infection. Your sexual activity has changed since you were last screened, and you are at increased risk for chlamydia or gonorrhea. Ask your health care provider if you are at risk. Ask your health care provider about whether you are at high risk for HIV. Your health care provider may recommend a prescription medicine to help prevent HIV infection. If you choose to take medicine to prevent HIV, you should first get tested for HIV. You should then be tested every 3 months for as long as you are taking the medicine. Pregnancy If you are about to stop having your period (premenopausal) and you may become pregnant, seek counseling before you get pregnant. Take 400 to 800 micrograms (mcg) of folic acid every day if you become pregnant. Ask for birth control (contraception) if you want to  prevent pregnancy. Osteoporosis and menopause Osteoporosis is a disease in which the bones lose minerals and strength with aging. This can result in bone fractures. If you are 89 years old or older, or if you are at risk for osteoporosis and fractures, ask your health care provider if you should: Be screened for bone loss. Take a calcium or vitamin D supplement to lower your risk of fractures. Be given hormone replacement therapy (HRT) to treat symptoms of menopause. Follow these instructions at home: Alcohol use Do not drink alcohol if: Your health care provider tells you not to drink. You are pregnant, may be pregnant, or are planning to become pregnant. If you drink alcohol: Limit how much you have to: 0-1 drink a day. Know how much alcohol is in your drink. In the [place] one drink equals one 12 oz bottle of beer (355 mL), one 5 oz glass of wine (148 mL), or one 1 oz glass of hard liquor (44 mL). Lifestyle Do not use any products that  contain nicotine or tobacco. These products include cigarettes, chewing tobacco, and vaping devices, such as e-cigarettes. If you need help quitting, ask your health care provider. Do not use street drugs. Do not share needles. Ask your health care provider for help if you need support or information about quitting drugs. General instructions Schedule regular health, dental, and eye exams. Stay current with your vaccines. Tell your health care provider if: You often feel depressed. You have ever been abused or do not feel safe at home. Summary Adopting a healthy lifestyle and getting preventive care are important in promoting health and wellness. Follow your health care provider's instructions about healthy diet, exercising, and getting tested or screened for diseases. Follow your health care provider's instructions on monitoring your cholesterol and blood pressure. This information is not intended to replace advice given to you by your health care provider. Make sure you discuss any questions you have with your health care provider. Document Revised: 01/16/2021 Document Reviewed: 01/16/2021 Elsevier Patient Education  Badger Lee, MD [facility]

## 2022-06-13 LAB — THYROID PANEL WITH TSH
Free Thyroxine Index: 2.8 (ref 1.4–3.8)
T3 Uptake: 31 % (ref 22–35)
T4, Total: 9 ug/dL (ref 5.1–11.9)
TSH: 0.97 mIU/L (ref 0.40–4.50)

## 2022-08-16 ENCOUNTER — Ambulatory Visit (INDEPENDENT_AMBULATORY_CARE_PROVIDER_SITE_OTHER): Payer: 59

## 2022-08-16 ENCOUNTER — Ambulatory Visit: Payer: 59 | Admitting: Podiatry

## 2022-08-16 ENCOUNTER — Encounter: Payer: Self-pay | Admitting: Podiatry

## 2022-08-16 VITALS — BP 123/78

## 2022-08-16 DIAGNOSIS — L600 Ingrowing nail: Secondary | ICD-10-CM

## 2022-08-16 DIAGNOSIS — M21622 Bunionette of left foot: Secondary | ICD-10-CM

## 2022-08-16 DIAGNOSIS — M778 Other enthesopathies, not elsewhere classified: Secondary | ICD-10-CM | POA: Diagnosis not present

## 2022-08-16 DIAGNOSIS — M7752 Other enthesopathy of left foot: Secondary | ICD-10-CM | POA: Diagnosis not present

## 2022-08-16 MED ORDER — TRIAMCINOLONE ACETONIDE 10 MG/ML IJ SUSP
10.0000 mg | Freq: Once | INTRAMUSCULAR | Status: AC
  Administered 2022-08-16: 10 mg

## 2022-08-17 NOTE — Progress Notes (Signed)
Subjective:   Patient ID: Chelsey Hunt, female   DOB: 56 y.o.   MRN: [id number]   HPI Patient presents stating she is developed a lot of pain in her left outside foot over the last few weeks and does not remember injury and also did injure her left big toe and it has split and can be a little bit discomforting   ROS      Objective:  Physical Exam  Neurovascular status intact muscle strength was found to be adequate inflammation pain around the fifth MPJ left foot with fluid buildup around the joint surface and discomfort with palpation and has a split in the left hallux nail     Assessment:  Continued inflammatory condition fifth MPJ left that did do well for about 7 months and has reoccurred along with nail disease with trauma left big toe     Plan:  H&P reviewed both conditions and for the outside foot I did do sterile prep and injected around the fifth MPJ 2 mg dexamethasone Kenalog 5 mg Xylocaine and discussed that ultimately this may require surgery which I explained to the patient.  For the nail I trimmed that we will see how this does and go from there.  Patient may need surgery long-term  X-rays indicate that the fifth MPJ does have some swelling around it I did not note other pathology

## 2022-08-20 ENCOUNTER — Other Ambulatory Visit (HOSPITAL_COMMUNITY): Payer: Self-pay

## 2022-08-20 ENCOUNTER — Other Ambulatory Visit: Payer: Self-pay | Admitting: Emergency Medicine

## 2022-08-20 DIAGNOSIS — E039 Hypothyroidism, unspecified: Secondary | ICD-10-CM

## 2022-08-20 MED ORDER — LEVOTHYROXINE SODIUM 75 MCG PO TABS
75.0000 ug | ORAL_TABLET | Freq: Every day | ORAL | 3 refills | Status: DC
  Filled 2022-08-20: qty 90, 90d supply, fill #0
  Filled 2022-11-22: qty 90, 90d supply, fill #1
  Filled 2023-02-22: qty 90, 90d supply, fill #2

## 2022-08-27 ENCOUNTER — Encounter: Payer: Self-pay | Admitting: Cardiology

## 2022-08-27 ENCOUNTER — Ambulatory Visit: Payer: 59 | Admitting: Cardiology

## 2022-08-27 VITALS — BP 118/67 | HR 72 | Resp 16 | Ht 60.0 in | Wt 138.4 lb

## 2022-08-27 DIAGNOSIS — R002 Palpitations: Secondary | ICD-10-CM | POA: Diagnosis not present

## 2022-08-27 DIAGNOSIS — E7800 Pure hypercholesterolemia, unspecified: Secondary | ICD-10-CM

## 2022-08-27 DIAGNOSIS — R0789 Other chest pain: Secondary | ICD-10-CM

## 2022-08-27 NOTE — Patient Instructions (Signed)
Palpitations, symptoms suggest PAC/PVC: I have reassured the patient. I will emperically try Vit B1 (Thiamine) 50 mg, Vit B6 (Pyrodoxine) 50 mg and Vit B12 (rappid release) 109mg, twice daily for palpitations and tachycardia. Consider Fish oil capsule daily with food.  PAC = Premature atrial complexes: These arise from the upper chamber of the heart.  These are very common and are not dangerous.  Extra skipped beat coming from the upper chamber (atrium) and mostly are life altering (nuisance) than life threatening and mostly treated by reassurance.  There may not be any specific reasons for this, however patients with excessive caffeine, anxiety, lack of sleep, alcohol or thyroid problems can have these episodes.

## 2022-08-27 NOTE — Progress Notes (Signed)
Primary Physician/Referring:  Horald Pollen, MD  Patient ID: Chelsey Hunt, female    DOB: 02/05/1966, 56 y.o.   MRN: [id number]  Chief Complaint  Patient presents with   Chest tension   heart flutters   HPI:    Chelsey Hunt  is a 56 y.o. African American female. 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  I had seen her 6 months ago for chest pain which I felt was musculoskeletal.  She made an appointment to see me back for recurrence of chest pain and also palpitations.  She continues to remain active, states that palpitation symptoms occur only at night when she lays down lasting a few seconds.  Left upper sided chest pain is constant can last for hours to days with no exertional component.  No hemoptysis, no dyspnea, no leg edema.  Past Medical History:  Diagnosis Date   ACID REFLUX DISEASE 01/03/2008   Anemia    ANEMIA, IRON DEFICIENCY 12/23/2008   history   Anxiety    Chronic tension type headache 01/13/2009   migraines   Complication of anesthesia    pt states she just needs a small amount or she will sleep too long   Dysplasia of cervix, low grade (CIN 1)    Fibroid    GERD (gastroesophageal reflux disease)    HEART MURMUR, SYSTOLIC 98/92/1194   will have echo 06/18/2013   HYPOTHYROIDISM, POST-RADIATION 08/12/2009   Irritable bowel syndrome 12/23/2008   Migraine    Ovarian cyst    Ovarian cyst    THYROID NODULE, RIGHT 04/21/2009   Social History   Tobacco Use   Smoking status: Never    Passive exposure: Past   Smokeless tobacco: Never  Substance Use Topics   Alcohol use: No   Marital Status: Married  ROS  Review of Systems  Cardiovascular:  Positive for chest pain and palpitations. Negative for dyspnea on exertion and leg swelling.   Objective  Blood pressure 118/67, pulse 72, resp. rate 16, height 5' (1.524 m), weight 138 lb 6.4 oz (62.8 kg), last menstrual period 04/19/2013, SpO2 99 %. Body mass index is 27.03 kg/m.     08/27/2022    11:19 AM 08/16/2022    2:21 PM 06/12/2022    2:25 PM  Vitals with BMI  Height '5\' 0"'$   '5\' 0"'$   Weight 138 lbs 6 oz  134 lbs 8 oz  BMI 17.40  81.44  Systolic 818 563 149  Diastolic 67 78 84  Pulse 72  83    Physical Exam  Neck: No JVD present.  Cardiovascular: Regular rhythm, normal heart sounds, intact distal pulses and normal pulses. Exam reveals no gallop.  No murmur heard. Pulmonary/Chest: Effort normal and breath sounds normal.  Abdominal: Soft. Bowel sounds are normal.  Musculoskeletal:        General: No edema.   Allergies   Allergies  Allergen Reactions   Imitrex [Sumatriptan] Palpitations   Reglan [Metoclopramide] Palpitations   Rosuvastatin Nausea Only   Propoxyphene N-Acetaminophen    Nabumetone Rash    REACTION: GI upset   Topiramate Rash    REACTION: rash    Medication list after today's encounter   Current Outpatient Medications:    acetaminophen (TYLENOL) 325 MG tablet, Take 650 mg by mouth every 6 (six) hours as needed for mild pain., Disp: , Rfl:    co-enzyme Q-10 30 MG capsule, Take 200 mg by mouth 3 (three) times daily., Disp: , Rfl:    cyclobenzaprine (  FLEXERIL) 5 MG tablet, Take 1 tablet (5 mg total) by mouth 3 (three) times daily as needed for muscle spasms., Disp: 30 tablet, Rfl: 5   dicyclomine (BENTYL) 10 MG capsule, Take 1 capsule (10 mg total) by mouth 3 (three) times daily as needed., Disp: 90 capsule, Rfl: 3   ibuprofen (ADVIL,MOTRIN) 200 MG tablet, Take 200 mg by mouth every 8 (eight) hours as needed., Disp: , Rfl:    levothyroxine (SYNTHROID) 75 MCG tablet, Take 1 tablet (75 mcg total) by mouth daily., Disp: 90 tablet, Rfl: 3   pantoprazole (PROTONIX) 20 MG tablet, Take 1 tablet (20 mg total) by mouth 2 (two) times daily before a meal Take 30-45 mins prior to breakfast and dinner., Disp: 180 tablet, Rfl: 3   promethazine (PHENERGAN) 25 MG tablet, Take 1 tablet (25 mg total) by mouth every 6 (six) hours as needed for nausea or vomiting., Disp: 30  tablet, Rfl: 5   Red Yeast Rice Extract (RED YEAST RICE PO), Take 1 capsule by mouth every evening. 100 mg, Disp: , Rfl:    Ubrogepant (UBRELVY) 100 MG TABS, Take 1 tablet (100 mg) by mouth as needed. May repeat dose in 2 hours.  Maximum 2 tablets in 24 hours., Disp: 16 tablet, Rfl: 5   valACYclovir (VALTREX) 1000 MG tablet, Take 1 tablet (1,000 mg total) by mouth every 12 (twelve) hours for 5 days, Disp: 30 tablet, Rfl: 11  Laboratory examination:   Recent Labs    10/12/21 0855 03/08/22 0833 06/12/22 1450  NA 140 136 139  K 4.1 4.2 4.0  CL 101 102 103  CO2 '29 28 27  '$ GLUCOSE 93 87 92  BUN '15 14 13  '$ CREATININE 0.99 1.03 0.97  CALCIUM 10.6* 10.1 10.1   CrCl cannot be calculated (Patient's most recent lab result is older than the maximum 21 days allowed.).     Latest Ref Rng & Units 06/12/2022    2:50 PM 03/08/2022    8:33 AM 10/12/2021    8:55 AM  CMP  Glucose 70 - 99 mg/dL 92  87  93   BUN 6 - 23 mg/dL '13  14  15   '$ Creatinine 0.40 - 1.20 mg/dL 0.97  1.03  0.99   Sodium 135 - 145 mEq/L 139  136  140   Potassium 3.5 - 5.1 mEq/L 4.0  4.2  4.1   Chloride 96 - 112 mEq/L 103  102  101   CO2 19 - 32 mEq/L '27  28  29   '$ Calcium 8.4 - 10.5 mg/dL 10.1  10.1  10.6   Total Protein 6.0 - 8.3 g/dL 8.0  7.8  8.6   Total Bilirubin 0.2 - 1.2 mg/dL 0.3  0.4  0.4   Alkaline Phos 39 - 117 U/L 104  107  104   AST 0 - 37 U/L '16  17  20   '$ ALT 0 - 35 U/L '14  15  17       '$ Latest Ref Rng & Units 03/08/2022    8:33 AM 10/12/2021    8:55 AM 02/21/2021    3:45 PM  CBC  WBC 4.0 - 10.5 K/uL 4.7  5.1  7.1   Hemoglobin 12.0 - 15.0 g/dL 13.3  13.4  13.7   Hematocrit 36.0 - 46.0 % 39.5  40.8  40.9   Platelets 150.0 - 400.0 K/uL 351.0  376.0  388.0     Lipid Panel Recent Labs    03/08/22 0833  CHOL  244*  TRIG 85.0  LDLCALC 157*  VLDL 17.0  HDL 69.70  CHOLHDL 3   HEMOGLOBIN A1C Lab Results  Component Value Date   HGBA1C 6.1 06/12/2022   TSH Recent Labs    10/12/21 0855 03/08/22 0833  06/12/22 1450  TSH 1.17 2.12 0.97   Radiology:   NA  Cardiac Studies:  CORONARY CALCIUM SCORES 01/12/2022:  Left Main: 0  LAD: 0  LCx: 0  RCA: 0  Total Agatston Score: 0  MESA database percentile: 0    Ascending Aorta: 24 mm  Descending Aorta: 19 mm   OTHER FINDINGS: Heart is normal size. Aorta normal caliber. Single punctate calcification seen in the distal aortic arch. No adenopathy. No confluent opacities or effusions. No acute findings in the upper abdomen. Chest wall soft tissues are unremarkable. No acute bony abnormality.   IMPRESSION: No visible coronary artery calcifications. Total coronary calcium score of 0. Single calcification seen in the distal aortic arch. No acute extra cardiac abnormality.  Echocardiogram 12/21/2021:  Normal LV systolic function with visual EF 60-65%. Left ventricle cavity is normal in size. Normal left ventricular wall thickness. Normal global wall motion. Normal diastolic filling pattern, normal LAP.  No significant valvular abnormalities.  Echo density noted within the right atrium in the RV inflow and subcostal view (DDx prominent eustachian valve, artifact due to echogenic tricuspid annulus, etc). Images attached.  Consider either transesophageal echocardiogram, cardiac MRI, or limited echo with Definity for further evaluation. Clinical correlation needed.  No prior study for comparison.  Exercise treadmill stress test 01/19/2022: Exercise treadmill stress test performed using Bruce protocol.  Patient reached 10.3 METS, and 90% of age predicted maximum heart rate.  Exercise capacity was excellent. No chest pain reported.  Normal heart rate and hemodynamic response. Stress EKG revealed no ischemic changes. Low risk study.  EKG:   EKG 08/27/2022: Normal sinus rhythm at rate of 66 bpm.  No change from 12/14/2021.  Assessment     ICD-10-CM   1. Atypical chest pain  R07.89 EKG 12-Lead    2. Palpitations  R00.2     3. Hypercholesteremia   E78.00       Patient's cardiovascular risk history includes: Fr[place]ham 10-year risk <10%  ACC/AHA CV risk calculator: 10-year risk of atherosclerotic cardiovascular disease 2.3%, optimal risk 2%.  Medications Discontinued During This Encounter  Medication Reason   dicyclomine (BENTYL) 10 MG capsule    famotidine (PEPCID) 40 MG tablet    No orders of the defined types were placed in this encounter.  Orders Placed This Encounter  Procedures   EKG 12-Lead    Recommendations:   Chelsey Hunt is a 56 y.o. African American female. 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 Patient was referred by Dr Agustina Caroli for sharp chest pain lasting for hours since December, 2022. She occasionally had fatigue and numbness at her left arm. She has also noticed episodes of heart skipping. 1. Musculoskeletal chest pain Chest pain symptoms are clearly musculoskeletal and again I have reassured her.  2. Palpitations Her symptoms of palpitation mostly occurring when she is laying down lasting a few seconds are clearly indicative of PACs and PVCs.  I simply reassured her.  3. Hypercholesteremia Patient prefers not to be on any medication, but she has started red yeast rice on her own.  Advised her that she could certainly go on a small dose of a statin.  But in the absence of family history, absence of hypertension, tobacco use, obesity,  ACC/AHA CV risk calculator and she is at very low risk.  At this point shared decision making regarding lipid-lowering therapy, patient prefers to continue lifestyle modification only and so did not start her on any medication.   Adrian Prows, MD, [facility] 08/27/2022, 11:49 AM Office: [phone] Fax: [phone] Pager: [phone]

## 2022-09-25 ENCOUNTER — Other Ambulatory Visit (HOSPITAL_COMMUNITY): Payer: Self-pay

## 2022-09-25 DIAGNOSIS — Z1231 Encounter for screening mammogram for malignant neoplasm of breast: Secondary | ICD-10-CM | POA: Diagnosis not present

## 2022-09-25 DIAGNOSIS — Z01419 Encounter for gynecological examination (general) (routine) without abnormal findings: Secondary | ICD-10-CM | POA: Diagnosis not present

## 2022-09-25 DIAGNOSIS — Z1211 Encounter for screening for malignant neoplasm of colon: Secondary | ICD-10-CM | POA: Diagnosis not present

## 2022-09-25 DIAGNOSIS — A609 Anogenital herpesviral infection, unspecified: Secondary | ICD-10-CM | POA: Diagnosis not present

## 2022-09-25 LAB — HM MAMMOGRAPHY

## 2022-09-25 MED ORDER — VALACYCLOVIR HCL 1 G PO TABS
1000.0000 mg | ORAL_TABLET | Freq: Two times a day (BID) | ORAL | 11 refills | Status: DC
  Filled 2022-09-25: qty 30, 15d supply, fill #0

## 2022-09-26 ENCOUNTER — Other Ambulatory Visit (HOSPITAL_COMMUNITY): Payer: Self-pay

## 2022-10-01 ENCOUNTER — Other Ambulatory Visit (HOSPITAL_COMMUNITY): Payer: Self-pay

## 2022-10-01 MED ORDER — PANTOPRAZOLE SODIUM 20 MG PO TBEC
20.0000 mg | DELAYED_RELEASE_TABLET | Freq: Two times a day (BID) | ORAL | 1 refills | Status: DC
  Filled 2022-10-01: qty 180, 90d supply, fill #0

## 2022-10-03 ENCOUNTER — Other Ambulatory Visit (HOSPITAL_COMMUNITY): Payer: Self-pay

## 2022-10-19 ENCOUNTER — Telehealth: Payer: Self-pay | Admitting: Neurology

## 2022-10-19 NOTE — Telephone Encounter (Signed)
Patient wants a call back to make sure jaffe got her FMLA paperwork done. She will need a call  back before 230pm

## 2022-10-19 NOTE — Telephone Encounter (Signed)
Patient advised paperwork received.

## 2022-10-22 ENCOUNTER — Telehealth: Payer: Self-pay

## 2022-10-22 NOTE — Telephone Encounter (Signed)
FMLA Faxed to [employer] 10/22/22/

## 2022-10-24 ENCOUNTER — Other Ambulatory Visit: Payer: Self-pay | Admitting: Neurology

## 2022-10-24 ENCOUNTER — Other Ambulatory Visit (HOSPITAL_COMMUNITY): Payer: Self-pay

## 2022-10-24 NOTE — Progress Notes (Signed)
NEUROLOGY FOLLOW UP OFFICE NOTE  Chelsey Hunt [id number]  Assessment/Plan:   Migraine without aura, without status migrainosus, not intractable  Myofascial pain    1 She is hesitant about starting a new preventative medication.  I directed her to consider Cefaly.  I also suggested Vyepti or Qulipta for alternative preventative medications.  She will look into these recommendations. 2  Ubrelvy as needed 3  Cyclobenzaprine for neck pain 4  Follow up in 5 months.   Subjective:  Chelsey Hunt is a 57 year old right-handed woman with hypothyroidism who follows up for migraines.   UPDATE: Headaches improved after seeing the chiropractor but she hasn't seen him in over a year.  The migraines have started to increase again.  She thinks it has to do with her sinuses and change in barometric pressure.  She had 10-11 migraines over the last 4 weeks (4 were severe).  Severe migraines last 2-3 hours, mild-moderate last 1-2 hours with Chelsey Hunt.   Current NSAIDS: ASA 46m Current analgesics: none Current triptans: None Current ergotamine: None Current abortive anti-CGRP:  none Current anti-emetic: Promethazine Current muscle relaxants: Cyclobenzaprine 10 mg (neck pain) Current anti-anxiolytic: None Current sleep aide: None Current Antihypertensive medications: None Current Antidepressant medications: none Current Anticonvulsant medications: gabapentin 1046mQHS (for scalp neuralgia; cannot tolerate higher doses) Current anti-CGRP: Ubrelvy 10013murrent Vitamins/Herbal/Supplements: Co Q-10; turmeric Current Antihistamines/Decongestants: None Other therapy: Physical therapy for neck pain; dry needling, chiropractor Hormone/birth control: None   Caffeine: No Diet: 5-6 bottles of water daily Exercise: She is now exercising - treadmill and bike 1 day a week.  Trying to increase to 2-3 times a week. Depression: No; Anxiety: Yes Other pain:  neck pain.  She has cervical  spondylosis Sleep hygiene: She never was able to pick up the CPAP   HISTORY: Onset: First migraine was between 8 a31d 11 29ars old.  Migraine free until 2004 during her menstrual cycle.  Chronic for many years. Location:  Left more than right. Quality:  throbbing Initial intensity:  Severe.   Aura:  Initially no.  Later developed visual aura of "strands of hair floating" in her vision. Prodrome:  no Postdrome:  fatigue Associated symptoms:  Right facial tingling, nausea, photophobia, phonophobia, osmophobia.  She denies associated vomiting, visual disturbance or unilateral numbness or weakness. Initial duration:  3 days Initial Frequency:  daily Initial Frequency of abortive medication: Advil or Tylenol daily, Maxalt 2 days a week Triggers:  Certain smells, change in barometric pressure, sausages, certain cheese, bananas; brazilian nutes, COVID-19 (contracted in September 2020) Relieving factors: Exercise, staying hydrated Activity:  aggravates   During summer 2019, she has had throbbing occipital headache, right-sided neck pain radiating up to the occiput, a burning scalp neuralgia, associated with tinnitus, sometimes left or right sided facial numbness, facial twitching.  She started having a panic attack with palpitations.  She went to the ED on 06/26/18 for symptoms.  EKG showed sinus tachycardia.  She was treated with headache cocktail.  To evaluate worsening headaches, she had an MRI of the brain on 11/21/2018 which demonstrated nonspecific white matter hyperintensities in the cerebral white matter, likely chronic small vessel ischemic changes, but no acute abnormality.  For further evaluation of neck pain, cervical spine X-ray on 04/10/2019 showed slight refersal of the normal cervical lordosis with minimal retrolisthesis of C5 on C6 and cervical spondylosis with osteophytes most notable at C4-5 and C5-6.     Due to fatigue, her PCP checked for autoimmune condition.  ANA  was positive with  positive cyclic citrullin peptide antibody.  She has been referred to rheumatology.  She saw sleep medicine.  She was found to have moderate OSA and was ordered auto-PAP.    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   She has bilateral tinnitus.  Exam by ENT unremarkable.    Past NSAIDS: Ibuprofen, naproxen, Cambia Past analgesics:  Excedrin, Lidocaine nasal Past abortive triptans: Relpax, sumatriptan tablet, Maxalt, Tosymra NS, Zembrace.   Past abortive ergotamine: Migranal Past muscle relaxants:  none Past anti-emetic:  Zofran Past antihypertensive medications: Verapamil, propranolol Past antidepressant medications: Imipramine, nortriptyline Past anticonvulsant medications:  topiramate Past anti-CGRP:  Aimovig (aggravated migraine), Emgality (ineffective), Ajovy, Nurtec Past vitamins/Herbal/Supplements: Riboflavin, magnesium Past antihistamines/decongestants:  None Other past therapies:  acupuncture (triggered migraine), Reyvow   Family history of headache:  Mom (migraines), daughter (migraines)   MRI and MRA of the head from 03/09/2009 revealed nonspecific small subcortical white matter hyperintensities and 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  MRI of the brain on 11/21/2018 which demonstrated nonspecific white matter hyperintensities in the cerebral white matter.  To evaluate new visual aura with migraine (hair floating in her vision), MRI of brain with and without contrast performed on 02/10/2020 again demonstrated non-enhancing white matter T2 and FLAIR signal in the cerebral hemispheres, stable compared to prior imaging from 2020.  To further evaluate abnormal white matter on brain MRI, she had MRI of cervical and thoracic spine without contrast on 03/26/2020 which showed no cord lesions but did show incidental 1 cm T1 and T2 dark ovoid lesion in the T6  vertebral body.  Follow up MRI thoracic spine with contrast on 05/25/2020 showed no change and no enhancement, thought to represent benign marrow focus .  Vasculitis workup, including SSA/SSB antibodies and pan-ANCA panel, was negative.  Sed rate was 14 and CRP negative.She was evaluated by ophthalmology in September 2021 which demonstrated dry eye but no concerning findings such as optic neuritis.  PAST MEDICAL HISTORY: Past Medical History:  Diagnosis Date   ACID REFLUX DISEASE 01/03/2008   Anemia    ANEMIA, IRON DEFICIENCY 12/23/2008   history   Anxiety    Chronic tension type headache 01/13/2009   migraines   Complication of anesthesia    pt states she just needs a small amount or she will sleep too long   Dysplasia of cervix, low grade (CIN 1)    Fibroid    GERD (gastroesophageal reflux disease)    HEART MURMUR, SYSTOLIC 123456   will have echo 06/18/2013   HYPOTHYROIDISM, POST-RADIATION 08/12/2009   Irritable bowel syndrome 12/23/2008   Migraine    Ovarian cyst    Ovarian cyst    THYROID NODULE, RIGHT 04/21/2009    MEDICATIONS: Current Outpatient Medications on File Prior to Visit  Medication Sig Dispense Refill   acetaminophen (TYLENOL) 325 MG tablet Take 650 mg by mouth every 6 (six) hours as needed for mild pain.     co-enzyme Q-10 30 MG capsule Take 200 mg by mouth 3 (three) times daily.     cyclobenzaprine (FLEXERIL) 5 MG tablet Take 1 tablet (5 mg total) by mouth 3 (three) times daily as needed for muscle spasms. 30 tablet 5   dicyclomine (BENTYL) 10 MG capsule Take 1 capsule (10 mg total) by mouth 3 (three) times daily as needed. 90 capsule 3   ibuprofen (ADVIL,MOTRIN) 200 MG tablet Take 200  mg by mouth every 8 (eight) hours as needed.     levothyroxine (SYNTHROID) 75 MCG tablet Take 1 tablet (75 mcg total) by mouth daily. 90 tablet 3   pantoprazole (PROTONIX) 20 MG tablet Take 1 tablet (20 mg total) by mouth 2 (two) times daily before a meal. Take 30-45 minutes prior to  breakfast and dinner. 180 tablet 1   promethazine (PHENERGAN) 25 MG tablet Take 1 tablet (25 mg total) by mouth every 6 (six) hours as needed for nausea or vomiting. 30 tablet 5   Red Yeast Rice Extract (RED YEAST RICE PO) Take 1 capsule by mouth every evening. 100 mg     Ubrogepant (UBRELVY) 100 MG TABS Take 1 tablet (100 mg) by mouth as needed. May repeat dose in 2 hours.  Maximum 2 tablets in 24 hours. 16 tablet 5   valACYclovir (VALTREX) 1000 MG tablet Take 1 tablet (1,000 mg total) by mouth every 12 (twelve) hours for 5 days 30 tablet 11   valACYclovir (VALTREX) 1000 MG tablet Take 1 tablet (1,000 mg total) by mouth every 12 (twelve) hours for 5 days 30 tablet 11   No current facility-administered medications on file prior to visit.    ALLERGIES: Allergies  Allergen Reactions   Imitrex [Sumatriptan] Palpitations   Reglan [Metoclopramide] Palpitations   Rosuvastatin Nausea Only   Propoxyphene N-Acetaminophen    Nabumetone Rash    REACTION: GI upset   Topiramate Rash    REACTION: rash    FAMILY HISTORY: Family History  Problem Relation Age of Onset   Heart disease Mother    Thyroid disease Mother    Heart attack Mother    Cancer Father    Heart disease Father    Heart attack Father    Throat cancer Father    Lung cancer Father    Alcoholism Father    Graves' disease Sister    Asthma Sister    Hepatitis Sister    Stomach cancer Sister    CVA Brother    Alzheimer's disease Brother    Goiter Brother    Hypertension Brother    Hypertension Brother    Alcoholism Brother    Hyperlipidemia Maternal Aunt    Hypertension Paternal Grandfather    Healthy Daughter    GER disease Son    GER disease Son    Goiter Other        Siblings 2   Depression Neg Hx    Alcohol abuse Neg Hx    Drug abuse Neg Hx    Early death Neg Hx    Hearing loss Neg Hx    Kidney disease Neg Hx    Stroke Neg Hx       Objective:  Blood pressure 121/72, pulse 95, height 5' (1.524 m), weight  139 lb 6.4 oz (63.2 kg), last menstrual period 04/19/2013, SpO2 99 %. General: No acute distress.  Patient appears well-groomed.   Head:  Normocephalic/atraumatic    Metta Clines, DO  CC: Horald Pollen, MD

## 2022-10-26 ENCOUNTER — Other Ambulatory Visit (HOSPITAL_COMMUNITY): Payer: Self-pay

## 2022-10-29 ENCOUNTER — Ambulatory Visit: Payer: Commercial Managed Care - PPO | Admitting: Neurology

## 2022-10-29 ENCOUNTER — Other Ambulatory Visit (HOSPITAL_COMMUNITY): Payer: Self-pay

## 2022-10-29 ENCOUNTER — Encounter: Payer: Self-pay | Admitting: Neurology

## 2022-10-29 VITALS — BP 121/72 | HR 95 | Ht 60.0 in | Wt 139.4 lb

## 2022-10-29 DIAGNOSIS — G43009 Migraine without aura, not intractable, without status migrainosus: Secondary | ICD-10-CM

## 2022-10-29 DIAGNOSIS — T148XXA Other injury of unspecified body region, initial encounter: Secondary | ICD-10-CM

## 2022-10-29 MED ORDER — PROMETHAZINE HCL 25 MG PO TABS
25.0000 mg | ORAL_TABLET | Freq: Four times a day (QID) | ORAL | 5 refills | Status: DC | PRN
  Filled 2022-10-29: qty 30, 8d supply, fill #0
  Filled 2023-09-05: qty 30, 8d supply, fill #1

## 2022-10-29 MED ORDER — UBRELVY 100 MG PO TABS
100.0000 mg | ORAL_TABLET | ORAL | 5 refills | Status: DC | PRN
Start: 2022-10-29 — End: 2023-10-09
  Filled 2022-10-29 – 2022-12-18 (×3): qty 16, 30d supply, fill #0
  Filled 2023-09-05 – 2023-10-03 (×2): qty 16, 30d supply, fill #1

## 2022-10-29 NOTE — Patient Instructions (Signed)
Cefaly Vyepti [employer]

## 2022-10-30 ENCOUNTER — Other Ambulatory Visit (HOSPITAL_COMMUNITY): Payer: Self-pay

## 2022-10-31 ENCOUNTER — Other Ambulatory Visit (HOSPITAL_COMMUNITY): Payer: Self-pay

## 2022-11-05 ENCOUNTER — Other Ambulatory Visit (HOSPITAL_COMMUNITY): Payer: Self-pay

## 2022-11-07 ENCOUNTER — Other Ambulatory Visit (HOSPITAL_COMMUNITY): Payer: Self-pay

## 2022-11-07 MED ORDER — DICYCLOMINE HCL 10 MG PO CAPS
ORAL_CAPSULE | ORAL | 2 refills | Status: DC
  Filled 2023-05-03: qty 90, 30d supply, fill #0

## 2022-11-12 DIAGNOSIS — D2262 Melanocytic nevi of left upper limb, including shoulder: Secondary | ICD-10-CM | POA: Diagnosis not present

## 2022-11-12 DIAGNOSIS — D225 Melanocytic nevi of trunk: Secondary | ICD-10-CM | POA: Diagnosis not present

## 2022-11-12 DIAGNOSIS — L819 Disorder of pigmentation, unspecified: Secondary | ICD-10-CM | POA: Diagnosis not present

## 2022-11-12 DIAGNOSIS — D2272 Melanocytic nevi of left lower limb, including hip: Secondary | ICD-10-CM | POA: Diagnosis not present

## 2022-11-12 DIAGNOSIS — D2261 Melanocytic nevi of right upper limb, including shoulder: Secondary | ICD-10-CM | POA: Diagnosis not present

## 2022-11-12 DIAGNOSIS — D2371 Other benign neoplasm of skin of right lower limb, including hip: Secondary | ICD-10-CM | POA: Diagnosis not present

## 2022-11-12 DIAGNOSIS — L821 Other seborrheic keratosis: Secondary | ICD-10-CM | POA: Diagnosis not present

## 2022-11-14 ENCOUNTER — Encounter: Payer: Self-pay | Admitting: Emergency Medicine

## 2022-11-14 ENCOUNTER — Ambulatory Visit: Payer: Commercial Managed Care - PPO | Admitting: Emergency Medicine

## 2022-11-14 VITALS — BP 116/88 | HR 82 | Temp 98.4°F | Ht 60.0 in | Wt 139.1 lb

## 2022-11-14 DIAGNOSIS — F419 Anxiety disorder, unspecified: Secondary | ICD-10-CM

## 2022-11-14 DIAGNOSIS — K5904 Chronic idiopathic constipation: Secondary | ICD-10-CM

## 2022-11-14 DIAGNOSIS — K2100 Gastro-esophageal reflux disease with esophagitis, without bleeding: Secondary | ICD-10-CM

## 2022-11-14 DIAGNOSIS — R1010 Upper abdominal pain, unspecified: Secondary | ICD-10-CM | POA: Diagnosis not present

## 2022-11-14 LAB — COMPREHENSIVE METABOLIC PANEL
ALT: 18 U/L (ref 0–35)
AST: 20 U/L (ref 0–37)
Albumin: 4.5 g/dL (ref 3.5–5.2)
Alkaline Phosphatase: 110 U/L (ref 39–117)
BUN: 12 mg/dL (ref 6–23)
CO2: 27 mEq/L (ref 19–32)
Calcium: 10.6 mg/dL — ABNORMAL HIGH (ref 8.4–10.5)
Chloride: 103 mEq/L (ref 96–112)
Creatinine, Ser: 0.94 mg/dL (ref 0.40–1.20)
GFR: 67.38 mL/min (ref >60.00)
Glucose, Bld: 77 mg/dL (ref 70–99)
Potassium: 3.7 mEq/L (ref 3.5–5.1)
Sodium: 141 mEq/L (ref 135–145)
Total Bilirubin: 0.3 mg/dL (ref 0.2–1.2)
Total Protein: 8.6 g/dL — ABNORMAL HIGH (ref 6.0–8.3)

## 2022-11-14 LAB — LIPASE: Lipase: 55 U/L (ref 11.0–59.0)

## 2022-11-14 LAB — CBC WITH DIFFERENTIAL/PLATELET
Basophils Absolute: 0 10*3/uL (ref 0.0–0.1)
Basophils Relative: 0.7 % (ref 0.0–3.0)
Eosinophils Absolute: 0.3 10*3/uL (ref 0.0–0.7)
Eosinophils Relative: 4.7 % (ref 0.0–5.0)
HCT: 44.6 % (ref 36.0–46.0)
Hemoglobin: 14.7 g/dL (ref 12.0–15.0)
Lymphocytes Relative: 52.7 % — ABNORMAL HIGH (ref 12.0–46.0)
Lymphs Abs: 3 10*3/uL (ref 0.7–4.0)
MCHC: 33 g/dL (ref 30.0–36.0)
MCV: 92.7 fl (ref 78.0–100.0)
Monocytes Absolute: 0.3 10*3/uL (ref 0.1–1.0)
Monocytes Relative: 5.8 % (ref 3.0–12.0)
Neutro Abs: 2.1 10*3/uL (ref 1.4–7.7)
Neutrophils Relative %: 36.1 % — ABNORMAL LOW (ref 43.0–77.0)
Platelets: 354 10*3/uL (ref 150.0–400.0)
RBC: 4.82 Mil/uL (ref 3.87–5.11)
RDW: 14.3 % (ref 11.5–15.5)
WBC: 5.7 10*3/uL (ref 4.0–10.5)

## 2022-11-14 LAB — LIPID PANEL
Cholesterol: 275 mg/dL — ABNORMAL HIGH (ref 0–200)
HDL: 82.6 mg/dL (ref >39.00)
LDL Cholesterol: 173 mg/dL — ABNORMAL HIGH (ref 0–99)
NonHDL: 192.35
Total CHOL/HDL Ratio: 3
Triglycerides: 98 mg/dL (ref 0.0–149.0)
VLDL: 19.6 mg/dL (ref 0.0–40.0)

## 2022-11-14 NOTE — Assessment & Plan Note (Addendum)
Still active and affecting quality of life Still taking pantoprazole 20 mg twice a day Will probably need repeat upper endoscopy in the near future

## 2022-11-14 NOTE — Progress Notes (Signed)
Chelsey Hunt 57 y.o.   Chief Complaint  Patient presents with   Acute Visit    Patient having some stomach issues, patient called GI but was not able to get an appt until later April,  Upper abd pain right side, nausea, acid reflux, lack of appetite, patient states the pain comes and goes     HISTORY OF PRESENT ILLNESS: This is a 57 y.o. female complaining of intermittent episodes of upper and right-sided abdominal pain for the past 2 and half weeks with occasional nausea but no vomiting Has history of GERD takes pantoprazole daily Denies rectal bleeding or melena.  Has history of chronic constipation Denies fever or chills.  No weight loss or decreased appetite No other complaints or medical concerns today.  HPI   Prior to Admission medications   Medication Sig Start Date End Date Taking? Authorizing Provider  acetaminophen (TYLENOL) 325 MG tablet Take 650 mg by mouth every 6 (six) hours as needed for mild pain.   Yes [provider]  Cholecalciferol (D3 2000 PO) Take by mouth.   Yes [provider]  co-enzyme Q-10 30 MG capsule Take 200 mg by mouth 3 (three) times daily.   Yes [provider]  cyclobenzaprine (FLEXERIL) 5 MG tablet Take 1 tablet (5 mg total) by mouth 3 (three) times daily as needed for muscle spasms. 01/23/22  Yes Jaffe, Adam R, DO  dicyclomine (BENTYL) 10 MG capsule Take 1 capsule (10 mg total) by mouth 3 (three) times daily as needed. 08/24/21  Yes   dicyclomine (BENTYL) 10 MG capsule Take 1 capsule (10 mg total) by mouth 3 times daily. as needed 11/07/22  Yes   ibuprofen (ADVIL,MOTRIN) 200 MG tablet Take 200 mg by mouth every 8 (eight) hours as needed.   Yes [provider]  levothyroxine (SYNTHROID) 75 MCG tablet Take 1 tablet (75 mcg total) by mouth daily. 08/20/22  Yes Dagoberto Nealy, Ines Bloomer, MD  magnesium 30 MG tablet Take 30 mg by mouth daily.   Yes [provider]  pantoprazole (PROTONIX) 20 MG tablet Take 1  tablet (20 mg total) by mouth 2 (two) times daily before a meal. Take 30-45 minutes prior to breakfast and dinner. 10/01/22  Yes   promethazine (PHENERGAN) 25 MG tablet Take 1 tablet (25 mg total) by mouth every 6 (six) hours as needed for nausea or vomiting. 10/29/22  Yes Jaffe, Adam R, DO  Red Yeast Rice Extract (RED YEAST RICE PO) Take 1 capsule by mouth every evening. 100 mg   Yes [provider]  thiamine (VITAMIN B-1) 50 MG tablet Take 50 mg by mouth daily.   Yes [provider]  Ubrogepant (UBRELVY) 100 MG TABS Take 1 tablet (100 mg) by mouth as needed. May repeat dose in 2 hours.  Maximum 2 tablets in 24 hours. 10/29/22  Yes Tomi Likens, Adam R, DO  valACYclovir (VALTREX) 1000 MG tablet Take 1 tablet (1,000 mg total) by mouth every 12 (twelve) hours for 5 days 09/25/21  Yes   valACYclovir (VALTREX) 1000 MG tablet Take 1 tablet (1,000 mg total) by mouth every 12 (twelve) hours for 5 days 09/25/22  Yes     Allergies  Allergen Reactions   Imitrex [Sumatriptan] Palpitations   Reglan [Metoclopramide] Palpitations   Rosuvastatin Nausea Only   Propoxyphene N-Acetaminophen    Nabumetone Rash    REACTION: GI upset   Topiramate Rash    REACTION: rash    Patient Active Problem List   Diagnosis Date Noted  Musculoskeletal neck pain 06/12/2022   Cervical radiculopathy 04/10/2022   Statin intolerance 03/08/2022   Family history of heart disease 11/15/2021   Obstructive sleep apnea 09/07/2021   Prediabetes 09/07/2021   Dyslipidemia 08/14/2021   Positive ANA (antinuclear antibody) 04/20/2021   Finger deformity 04/20/2021   Raynaud phenomenon 09/17/2019   Anxiety 07/30/2019   Chronic idiopathic constipation 01/08/2019   Psychophysiological insomnia 07/07/2018   Seasonal allergic rhinitis due to pollen 08/08/2017   Intractable migraine without aura and without status migrainosus 07/02/2016   Pancreas divisum of native pancreas 12/27/2014   Uterine leiomyoma 09/26/2012   GERD  (gastroesophageal reflux disease)    Hypothyroidism 08/12/2009    Past Medical History:  Diagnosis Date   ACID REFLUX DISEASE 01/03/2008   Anemia    ANEMIA, IRON DEFICIENCY 12/23/2008   history   Anxiety    Chronic tension type headache 01/13/2009   migraines   Complication of anesthesia    pt states she just needs a small amount or she will sleep too long   Dysplasia of cervix, low grade (CIN 1)    Fibroid    GERD (gastroesophageal reflux disease)    HEART MURMUR, SYSTOLIC [id number]   will have echo 06/18/2013   HYPOTHYROIDISM, POST-RADIATION 08/12/2009   Irritable bowel syndrome 12/23/2008   Migraine    Ovarian cyst    Ovarian cyst    THYROID NODULE, RIGHT 04/21/2009    Past Surgical History:  Procedure Laterality Date   ABDOMINAL HYSTERECTOMY     COLONOSCOPY     HYSTEROSCOPY     LAPAROSCOPY     NOVASURE ABLATION     POLYPECTOMY     TUBAL LIGATION     UPPER GI ENDOSCOPY     VAGINAL HYSTERECTOMY N/A 06/24/2013   Procedure: TOTAL HYSTERECTOMY VAGINAL;  Surgeon: Alwyn Pea, MD;  Location: [facility];  Service: Gynecology;  Laterality: N/A;    Social History   Socioeconomic History   Marital status: Married    Spouse name: Shanon Brow   Number of children: 3   Years of education: Not on file   Highest education level: Associate degree: occupational, Hotel manager, or vocational program  Occupational History   Occupation: WOMENS    Employer: [employer]  Tobacco Use   Smoking status: Never    Passive exposure: Past   Smokeless tobacco: Never  Vaping Use   Vaping Use: Never used  Substance and Sexual Activity   Alcohol use: No   Drug use: No   Sexual activity: Yes    Birth control/protection: Surgical    Comment: BTL  Other Topics Concern   Not on file  Social History Narrative   Regular exercise-yes      Patient is right-handed.She lives with her husband in a 1 story home. She has recently been avoiding caffeine. Walks.   Social Determinants of Health    Financial Resource Strain: Not on file  Food Insecurity: No Food Insecurity (06/28/2021)   Hunger Vital Sign    Worried About Running Out of Food in the Last Year: Never true    Ran Out of Food in the Last Year: Never true  Transportation Needs: Not on file  Physical Activity: Not on file  Stress: Not on file  Social Connections: Not on file  Intimate Partner Violence: Not on file    Family History  Problem Relation Age of Onset   Heart disease Mother    Thyroid disease Mother    Heart attack Mother    Cancer  Father    Heart disease Father    Heart attack Father    Throat cancer Father    Lung cancer Father    Alcoholism Father    Graves' disease Sister    Asthma Sister    Hepatitis Sister    Stomach cancer Sister    CVA Brother    Alzheimer's disease Brother    Goiter Brother    Hypertension Brother    Hypertension Brother    Alcoholism Brother    Hyperlipidemia Maternal Aunt    Hypertension Paternal Grandfather    Healthy Daughter    GER disease Son    GER disease Son    Goiter Other        Siblings 2   Depression Neg Hx    Alcohol abuse Neg Hx    Drug abuse Neg Hx    Early death Neg Hx    Hearing loss Neg Hx    Kidney disease Neg Hx    Stroke Neg Hx      Review of Systems  Constitutional: Negative.  Negative for chills and fever.  HENT: Negative.  Negative for congestion and sore throat.   Respiratory: Negative.  Negative for cough and shortness of breath.   Cardiovascular: Negative.  Negative for chest pain and palpitations.  Gastrointestinal:  Positive for abdominal pain, constipation and nausea. Negative for blood in stool, diarrhea, melena and vomiting.  Genitourinary: Negative.  Negative for dysuria and hematuria.  Skin: Negative.  Negative for rash.  Neurological: Negative.  Negative for dizziness and headaches.  All other systems reviewed and are negative.  Today's Vitals   11/14/22 1517  BP: 116/88  Pulse: 82  Temp: 98.4 F (36.9 C)   TempSrc: Oral  SpO2: 97%  Weight: 139 lb 2 oz (63.1 kg)  Height: 5' (1.524 m)   Body mass index is 27.17 kg/m.   Physical Exam Vitals reviewed.  Constitutional:      Appearance: Normal appearance.  HENT:     Head: Normocephalic.     Mouth/Throat:     Mouth: Mucous membranes are moist.     Pharynx: Oropharynx is clear.  Eyes:     Extraocular Movements: Extraocular movements intact.     Conjunctiva/sclera: Conjunctivae normal.     Pupils: Pupils are equal, round, and reactive to light.  Cardiovascular:     Rate and Rhythm: Normal rate and regular rhythm.     Pulses: Normal pulses.     Heart sounds: Normal heart sounds.  Pulmonary:     Effort: Pulmonary effort is normal.     Breath sounds: Normal breath sounds.  Abdominal:     General: There is no distension.     Palpations: Abdomen is soft.     Tenderness: There is no abdominal tenderness. There is no right CVA tenderness or left CVA tenderness.  Musculoskeletal:     Cervical back: No tenderness.  Lymphadenopathy:     Cervical: No cervical adenopathy.  Skin:    General: Skin is warm and dry.     Capillary Refill: Capillary refill takes less than 2 seconds.  Neurological:     General: No focal deficit present.     Mental Status: She is alert and oriented to person, place, and time.  Psychiatric:        Mood and Affect: Mood normal.        Behavior: Behavior normal.      ASSESSMENT & PLAN: A total of 43 minutes was spent with the patient  and counseling/coordination of care regarding preparing for this visit, review of most recent office visit notes, review of multiple chronic medical conditions under treatment, review of all medications, review of most recent blood work results, differential diagnosis of upper abdominal pain and need for workup including blood work and gallbladder ultrasound, need for GI evaluation and possible upper endoscopy, education on nutrition, stress management, prognosis, documentation and  need for follow-up.  Problem List Items Addressed This Visit       Digestive   GERD (gastroesophageal reflux disease)    Still active and affecting quality of life Still taking pantoprazole 20 mg twice a day Will probably need repeat upper endoscopy in the near future      Chronic idiopathic constipation    Advised to increase amount of fiber in her diet and stay well-hydrated        Other   Anxiety    Increased over the last several weeks.  May be a contributing factor in her symptoms.      Upper abdominal pain - Primary    Clinically stable.  No red flag signs or symptoms. Differential diagnosis discussed. Needs gallbladder ultrasound to rule out gallbladder disease Blood work done today GERD chronic condition contributing Continue pantoprazole Needs GI referral and upper endoscopy      Relevant Orders   US Abdomen Limited RUQ (LIVER/GB)   CBC with Differential/Platelet   Comprehensive metabolic panel   Hemoglobin A1c   Lipid panel   Lipase   Urinalysis   Patient Instructions  Abdominal Pain, Adult Many things can cause belly (abdominal) pain. Most times, belly pain is not dangerous. Many cases of belly pain can be watched and treated at home. Sometimes, though, belly pain is serious. Your doctor will try to find the cause of your belly pain. Follow these instructions at home:  Medicines Take over-the-counter and prescription medicines only as told by your doctor. Do not take medicines that help you poop (laxatives) unless told by your doctor. General instructions Watch your belly pain for any changes. Drink enough fluid to keep your pee (urine) pale yellow. Keep all follow-up visits as told by your doctor. This is important. Contact a doctor if: Your belly pain changes or gets worse. You are not hungry, or you lose weight without trying. You are having trouble pooping (constipated) or have watery poop (diarrhea) for more than 2-3 days. You have pain when  you pee or poop. Your belly pain wakes you up at night. Your pain gets worse with meals, after eating, or with certain foods. You are vomiting and cannot keep anything down. You have a fever. You have blood in your pee. Get help right away if: Your pain does not go away as soon as your doctor says it should. You cannot stop vomiting. Your pain is only in areas of your belly, such as the right side or the left lower part of the belly. You have bloody or black poop, or poop that looks like tar. You have very bad pain, cramping, or bloating in your belly. You have signs of not having enough fluid or water in your body (dehydration), such as: Dark pee, very little pee, or no pee. Cracked lips. Dry mouth. Sunken eyes. Sleepiness. Weakness. You have trouble breathing or chest pain. Summary Many cases of belly pain can be watched and treated at home. Watch your belly pain for any changes. Take over-the-counter and prescription medicines only as told by your doctor. Contact a doctor if your  belly pain changes or gets worse. Get help right away if you have very bad pain, cramping, or bloating in your belly. This information is not intended to replace advice given to you by your health care provider. Make sure you discuss any questions you have with your health care provider. Document Revised: 01/05/2019 Document Reviewed: 01/05/2019 Elsevier Patient Education  [place], MD [facility]

## 2022-11-14 NOTE — Patient Instructions (Signed)
Abdominal Pain, Adult Many things can cause belly (abdominal) pain. Most times, belly pain is not dangerous. Many cases of belly pain can be watched and treated at home. Sometimes, though, belly pain is serious. Your doctor will try to find the cause of your belly pain. Follow these instructions at home:  Medicines Take over-the-counter and prescription medicines only as told by your doctor. Do not take medicines that help you poop (laxatives) unless told by your doctor. General instructions Watch your belly pain for any changes. Drink enough fluid to keep your pee (urine) pale yellow. Keep all follow-up visits as told by your doctor. This is important. Contact a doctor if: Your belly pain changes or gets worse. You are not hungry, or you lose weight without trying. You are having trouble pooping (constipated) or have watery poop (diarrhea) for more than 2-3 days. You have pain when you pee or poop. Your belly pain wakes you up at night. Your pain gets worse with meals, after eating, or with certain foods. You are vomiting and cannot keep anything down. You have a fever. You have blood in your pee. Get help right away if: Your pain does not go away as soon as your doctor says it should. You cannot stop vomiting. Your pain is only in areas of your belly, such as the right side or the left lower part of the belly. You have bloody or black poop, or poop that looks like tar. You have very bad pain, cramping, or bloating in your belly. You have signs of not having enough fluid or water in your body (dehydration), such as: Dark pee, very little pee, or no pee. Cracked lips. Dry mouth. Sunken eyes. Sleepiness. Weakness. You have trouble breathing or chest pain. Summary Many cases of belly pain can be watched and treated at home. Watch your belly pain for any changes. Take over-the-counter and prescription medicines only as told by your doctor. Contact a doctor if your belly pain  changes or gets worse. Get help right away if you have very bad pain, cramping, or bloating in your belly. This information is not intended to replace advice given to you by your health care provider. Make sure you discuss any questions you have with your health care provider. Document Revised: 01/05/2019 Document Reviewed: 01/05/2019 Elsevier Patient Education  2023 Elsevier Inc.  

## 2022-11-14 NOTE — Assessment & Plan Note (Signed)
Clinically stable.  No red flag signs or symptoms. Differential diagnosis discussed. Needs gallbladder ultrasound to rule out gallbladder disease Blood work done today GERD chronic condition contributing Continue pantoprazole Needs GI referral and upper endoscopy

## 2022-11-14 NOTE — Assessment & Plan Note (Signed)
Advised to increase amount of fiber in her diet and stay well-hydrated

## 2022-11-14 NOTE — Assessment & Plan Note (Signed)
Increased over the last several weeks.  May be a contributing factor in her symptoms.

## 2022-11-15 ENCOUNTER — Encounter: Payer: Self-pay | Admitting: Emergency Medicine

## 2022-11-15 LAB — URINALYSIS
Bilirubin Urine: NEGATIVE
Hgb urine dipstick: NEGATIVE
Ketones, ur: NEGATIVE
Leukocytes,Ua: NEGATIVE
Nitrite: NEGATIVE
Specific Gravity, Urine: 1.025 (ref 1.000–1.030)
Total Protein, Urine: NEGATIVE
Urine Glucose: NEGATIVE
Urobilinogen, UA: 0.2 (ref 0.0–1.0)
pH: 6 (ref 5.0–8.0)

## 2022-11-15 LAB — HEMOGLOBIN A1C: Hgb A1c MFr Bld: 6.1 % (ref 4.6–6.5)

## 2022-11-16 ENCOUNTER — Other Ambulatory Visit: Payer: Self-pay | Admitting: Emergency Medicine

## 2022-11-16 ENCOUNTER — Other Ambulatory Visit (HOSPITAL_COMMUNITY): Payer: Self-pay

## 2022-11-16 DIAGNOSIS — E785 Hyperlipidemia, unspecified: Secondary | ICD-10-CM

## 2022-11-16 MED ORDER — ROSUVASTATIN CALCIUM 10 MG PO TABS
10.0000 mg | ORAL_TABLET | Freq: Every day | ORAL | 3 refills | Status: DC
  Filled 2022-11-16: qty 90, 90d supply, fill #0

## 2022-11-21 ENCOUNTER — Other Ambulatory Visit (HOSPITAL_COMMUNITY): Payer: Self-pay

## 2022-11-26 ENCOUNTER — Other Ambulatory Visit (HOSPITAL_COMMUNITY): Payer: Self-pay

## 2022-12-03 ENCOUNTER — Telehealth: Payer: Self-pay | Admitting: Neurology

## 2022-12-03 ENCOUNTER — Other Ambulatory Visit (HOSPITAL_COMMUNITY): Payer: Self-pay

## 2022-12-03 NOTE — Telephone Encounter (Signed)
Pt called in and left a message with the access nurse. She is wanting to find out about the pre-authorization for her medication. She did not specify which medication.

## 2022-12-04 ENCOUNTER — Telehealth: Payer: Self-pay

## 2022-12-04 ENCOUNTER — Other Ambulatory Visit (HOSPITAL_COMMUNITY): Payer: Self-pay

## 2022-12-04 NOTE — Telephone Encounter (Signed)
Patient Advocate Encounter  Prior authorization for Ubrelvy 100MG  tablets submitted and APPROVED on 12-04-2022.  Test billing returns $125.00 copay for 30 day supply. A manufacturer's coupon could possibly bring co-pay down as low as $0.00.  Key [id number] Effective: 12-04-2022 - 06-06-2023

## 2022-12-05 NOTE — Telephone Encounter (Signed)
Okay to stop rosuvastatin and see if her symptoms change

## 2022-12-07 NOTE — Telephone Encounter (Signed)
No

## 2022-12-12 ENCOUNTER — Encounter: Payer: Self-pay | Admitting: Cardiology

## 2022-12-12 ENCOUNTER — Ambulatory Visit: Payer: Commercial Managed Care - PPO | Admitting: Cardiology

## 2022-12-12 ENCOUNTER — Other Ambulatory Visit (HOSPITAL_COMMUNITY): Payer: Self-pay

## 2022-12-12 VITALS — BP 123/69 | HR 81 | Resp 16 | Ht 60.0 in | Wt 137.0 lb

## 2022-12-12 DIAGNOSIS — E559 Vitamin D deficiency, unspecified: Secondary | ICD-10-CM

## 2022-12-12 DIAGNOSIS — R0789 Other chest pain: Secondary | ICD-10-CM

## 2022-12-12 DIAGNOSIS — E7800 Pure hypercholesterolemia, unspecified: Secondary | ICD-10-CM | POA: Diagnosis not present

## 2022-12-12 MED ORDER — EZETIMIBE 10 MG PO TABS
10.0000 mg | ORAL_TABLET | Freq: Every evening | ORAL | 2 refills | Status: DC
Start: 2022-12-12 — End: 2023-04-01
  Filled 2022-12-12: qty 30, 30d supply, fill #0
  Filled 2023-01-25: qty 60, 60d supply, fill #1

## 2022-12-12 MED ORDER — SIMVASTATIN 40 MG PO TABS
40.0000 mg | ORAL_TABLET | Freq: Every day | ORAL | 2 refills | Status: DC
Start: 2022-12-12 — End: 2022-12-18
  Filled 2022-12-12: qty 30, 30d supply, fill #0

## 2022-12-12 NOTE — Progress Notes (Signed)
Primary Physician/Referring:  Horald Pollen, MD  Patient ID: Chelsey Hunt, female    DOB: 04-05-1966, 57 y.o.   MRN: [id number]  Chief Complaint  Patient presents with   Musculoskeletal chest pain   Palpitations   Follow-up   HPI:    Chelsey Hunt  is a 57 y.o. African American female. 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  I seen in the past for chest pain suggestive of musculoskeletal chest pain and also palpitation.  PACs and PVCs, she made an appointment to see me due to recurrence of chest pain.  Patient states that about a week ago she started having left-sided chest pain again focal, lasting anywhere from 10 to 15 minutes, sometimes she felt some discomfort in her left arm, took Tylenol and felt slightly better but wanted to be further evaluated.  Patient states that however after she had made the appointment, over the past 2 days she has not had any recurrence of chest pain.  She is presently asymptomatic.  Past Medical History:  Diagnosis Date   ACID REFLUX DISEASE 01/03/2008   Anemia    ANEMIA, IRON DEFICIENCY 12/23/2008   history   Anxiety    Chronic tension type headache 01/13/2009   migraines   Complication of anesthesia    pt states she just needs a small amount or she will sleep too long   Dysplasia of cervix, low grade (CIN 1)    Fibroid    GERD (gastroesophageal reflux disease)    HEART MURMUR, SYSTOLIC [id number]   will have echo 06/18/2013   HYPOTHYROIDISM, POST-RADIATION 08/12/2009   Irritable bowel syndrome 12/23/2008   Migraine    Ovarian cyst    Ovarian cyst    THYROID NODULE, RIGHT 04/21/2009   Social History   Tobacco Use   Smoking status: Never    Passive exposure: Past   Smokeless tobacco: Never  Substance Use Topics   Alcohol use: No   Marital Status: Married  ROS  Review of Systems  Cardiovascular:  Negative for chest pain, dyspnea on exertion, leg swelling and palpitations.   Objective  Blood pressure 123/69,  pulse 81, resp. rate 16, height 5' (1.524 m), weight 137 lb (62.1 kg), last menstrual period 04/19/2013, SpO2 99 %. Body mass index is 26.76 kg/m.     12/12/2022   12:59 PM 11/14/2022    3:17 PM 10/29/2022    8:46 AM  Vitals with BMI  Height 5\' 0"  5\' 0"  5\' 0"   Weight 137 lbs 139 lbs 2 oz 139 lbs 6 oz  BMI 26.76 [id number]  Systolic [id number]  Diastolic 69 88 72  Pulse 81 82 95    Physical Exam  Neck: No JVD present.  Cardiovascular: Regular rhythm, normal heart sounds, intact distal pulses and normal pulses. Exam reveals no gallop.  No murmur heard. Pulmonary/Chest: Effort normal and breath sounds normal.  Abdominal: Soft. Bowel sounds are normal.  Musculoskeletal:        General: No edema.   Allergies   Allergies  Allergen Reactions   Imitrex [Sumatriptan] Palpitations   Reglan [Metoclopramide] Palpitations   Rosuvastatin Other (See Comments)    Arthralgia and myalgia   Propoxyphene N-Acetaminophen    Nabumetone Rash    REACTION: GI upset   Topiramate Rash    REACTION: rash    Medication list after today's encounter   Current Outpatient Medications:    acetaminophen (TYLENOL) 325 MG tablet, Take 650 mg by mouth every 6 (  six) hours as needed for mild pain., Disp: , Rfl:    Cholecalciferol (D3 2000 PO), Take by mouth., Disp: , Rfl:    co-enzyme Q-10 30 MG capsule, Take 200 mg by mouth 3 (three) times daily., Disp: , Rfl:    cyclobenzaprine (FLEXERIL) 5 MG tablet, Take 1 tablet (5 mg total) by mouth 3 (three) times daily as needed for muscle spasms., Disp: 30 tablet, Rfl: 5   dicyclomine (BENTYL) 10 MG capsule, Take 1 capsule (10 mg total) by mouth 3 (three) times daily as needed., Disp: 90 capsule, Rfl: 3   dicyclomine (BENTYL) 10 MG capsule, Take 1 capsule (10 mg total) by mouth 3 times daily. as needed, Disp: 90 capsule, Rfl: 2   ezetimibe (ZETIA) 10 MG tablet, Take 1 tablet (10 mg total) by mouth every evening., Disp: 30 tablet, Rfl: 2   ibuprofen (ADVIL,MOTRIN) 200 MG  tablet, Take 200 mg by mouth every 8 (eight) hours as needed., Disp: , Rfl:    levothyroxine (SYNTHROID) 75 MCG tablet, Take 1 tablet (75 mcg total) by mouth daily., Disp: 90 tablet, Rfl: 3   magnesium 30 MG tablet, Take 30 mg by mouth daily., Disp: , Rfl:    promethazine (PHENERGAN) 25 MG tablet, Take 1 tablet (25 mg total) by mouth every 6 (six) hours as needed for nausea or vomiting., Disp: 30 tablet, Rfl: 5   simvastatin (ZOCOR) 40 MG tablet, Take 1 tablet (40 mg total) by mouth at bedtime., Disp: 30 tablet, Rfl: 2   Ubrogepant (UBRELVY) 100 MG TABS, Take 1 tablet (100 mg) by mouth as needed. May repeat dose in 2 hours.  Maximum 2 tablets in 24 hours., Disp: 16 tablet, Rfl: 5   valACYclovir (VALTREX) 1000 MG tablet, Take 1 tablet (1,000 mg total) by mouth every 12 (twelve) hours for 5 days, Disp: 30 tablet, Rfl: 11   valACYclovir (VALTREX) 1000 MG tablet, Take 1 tablet (1,000 mg total) by mouth every 12 (twelve) hours for 5 days, Disp: 30 tablet, Rfl: 11  Laboratory examination:   Recent Labs    03/08/22 0833 06/12/22 1450 11/14/22 1556  NA 136 139 141  K 4.2 4.0 3.7  CL 102 103 103  CO2 28 27 27   GLUCOSE 87 92 77  BUN 14 13 12   CREATININE 1.03 0.97 0.94  CALCIUM 10.1 10.1 10.6*   CrCl cannot be calculated (Patient's most recent lab result is older than the maximum 21 days allowed.).     Latest Ref Rng & Units 11/14/2022    3:56 PM 06/12/2022    2:50 PM 03/08/2022    8:33 AM  CMP  Glucose 70 - 99 mg/dL 77  92  87   BUN 6 - 23 mg/dL 12  13  14    Creatinine 0.40 - 1.20 mg/dL 0.94  0.97  1.03   Sodium 135 - 145 mEq/L 141  139  136   Potassium 3.5 - 5.1 mEq/L 3.7  4.0  4.2   Chloride 96 - 112 mEq/L 103  103  102   CO2 19 - 32 mEq/L 27  27  28    Calcium 8.4 - 10.5 mg/dL 10.6  10.1  10.1   Total Protein 6.0 - 8.3 g/dL 8.6  8.0  7.8   Total Bilirubin 0.2 - 1.2 mg/dL 0.3  0.3  0.4   Alkaline Phos 39 - 117 U/L 110  104  107   AST 0 - 37 U/L 20  16  17    ALT 0 -  35 U/L 18  14  15         Latest Ref Rng & Units 11/14/2022    3:56 PM 03/08/2022    8:33 AM 10/12/2021    8:55 AM  CBC  WBC 4.0 - 10.5 K/uL 5.7  4.7  5.1   Hemoglobin 12.0 - 15.0 g/dL 14.7  13.3  13.4   Hematocrit 36.0 - 46.0 % 44.6  39.5  40.8   Platelets 150.0 - 400.0 K/uL 354.0  351.0  376.0     Lipid Panel Recent Labs    03/08/22 0833 11/14/22 1556  CHOL 244* 275*  TRIG 85.0 98.0  LDLCALC 157* 173*  VLDL 17.0 19.6  HDL 69.70 82.60  CHOLHDL 3 3   HEMOGLOBIN A1C Lab Results  Component Value Date   HGBA1C 6.1 11/14/2022   TSH Recent Labs    03/08/22 0833 06/12/22 1450  TSH 2.12 0.97   Radiology:   NA  Cardiac Studies:  CORONARY CALCIUM SCORES 01/12/2022:  Left Main: 0  LAD: 0  LCx: 0  RCA: 0  Total Agatston Score: 0  MESA database percentile: 0    Ascending Aorta: 24 mm  Descending Aorta: 19 mm   OTHER FINDINGS: Heart is normal size. Aorta normal caliber. Single punctate calcification seen in the distal aortic arch. No adenopathy. No confluent opacities or effusions. No acute findings in the upper abdomen. Chest wall soft tissues are unremarkable. No acute bony abnormality.   IMPRESSION: No visible coronary artery calcifications. Total coronary calcium score of 0. Single calcification seen in the distal aortic arch. No acute extra cardiac abnormality.  Echocardiogram 12/21/2021:  Normal LV systolic function with visual EF 60-65%. Left ventricle cavity is normal in size. Normal left ventricular wall thickness. Normal global wall motion. Normal diastolic filling pattern, normal LAP.  No significant valvular abnormalities.  Echo density noted within the right atrium in the RV inflow and subcostal view (DDx prominent eustachian valve, artifact due to echogenic tricuspid annulus, etc). Images attached.  Consider either transesophageal echocardiogram, cardiac MRI, or limited echo with Definity for further evaluation. Clinical correlation needed.  No prior study for  comparison.  Exercise treadmill stress test 01/19/2022: Exercise treadmill stress test performed using Bruce protocol.  Patient reached 10.3 METS, and 90% of age predicted maximum heart rate.  Exercise capacity was excellent. No chest pain reported.  Normal heart rate and hemodynamic response. Stress EKG revealed no ischemic changes. Low risk study.  EKG:   EKG 08/27/2022: Normal sinus rhythm at rate of 66 bpm.  No change from 12/14/2021.  Assessment     ICD-10-CM   1. Musculoskeletal chest pain  R07.89 VITAMIN D 25 Hydroxy (Vit-D Deficiency, Fractures)    CANCELED: VITAMIN D 25 Hydroxy (Vit-D Deficiency, Fractures)    2. Hypercholesteremia  E78.00 simvastatin (ZOCOR) 40 MG tablet    ezetimibe (ZETIA) 10 MG tablet    3. Hypovitaminosis D  E55.9 VITAMIN D 25 Hydroxy (Vit-D Deficiency, Fractures)    CANCELED: VITAMIN D 25 Hydroxy (Vit-D Deficiency, Fractures)      Patient's cardiovascular risk history includes: Framingham 10-year risk <10%  ACC/AHA CV risk calculator: 10-year risk of atherosclerotic cardiovascular disease 2.3%, optimal risk 2%.  Medications Discontinued During This Encounter  Medication Reason   Red Yeast Rice Extract (RED YEAST RICE PO)    rosuvastatin (CRESTOR) 10 MG tablet    thiamine (VITAMIN B-1) 50 MG tablet    pantoprazole (PROTONIX) 20 MG tablet No longer needed (for PRN medications)   Meds  ordered this encounter  Medications   simvastatin (ZOCOR) 40 MG tablet    Sig: Take 1 tablet (40 mg total) by mouth at bedtime.    Dispense:  30 tablet    Refill:  2   ezetimibe (ZETIA) 10 MG tablet    Sig: Take 1 tablet (10 mg total) by mouth every evening.    Dispense:  30 tablet    Refill:  2   Orders Placed This Encounter  Procedures   VITAMIN D 25 Hydroxy (Vit-D Deficiency, Fractures)    Recommendations:   Chelsey Hunt is a 57 y.o. African American female. 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   1. Musculoskeletal chest pain Patient  symptoms of chest pain are clearly musculoskeletal.  She has costochondritis.  Pain lasted for about 4 to 5 days now she is no discomfort as of yesterday or today.  I have discussed with her how to address this by OTC NSAIDs and also heat and cold compression.  - VITAMIN D 25 Hydroxy (Vit-D Deficiency, Fractures)  2. Hypercholesteremia Patient has markedly elevated LDL, her coronary calcium score was 0.  Her ASCVD risk score is low.  However as the LDL is >160 and it has increased since last office visit almost a year ago, she could not tolerate Crestor but have started her on simvastatin 40 mg along with Zetia 10 mg in the evening, check lipids, goal LDL closer to 100.  - simvastatin (ZOCOR) 40 MG tablet; Take 1 tablet (40 mg total) by mouth at bedtime.  Dispense: 30 tablet; Refill: 2 - ezetimibe (ZETIA) 10 MG tablet; Take 1 tablet (10 mg total) by mouth every evening.  Dispense: 30 tablet; Refill: 2  3. Hypovitaminosis D Patient has diagnosis of hypovitaminosis D presently on supplements, will check her vitamin D levels.  May have led to statin intolerance as well.   I will see her back on a as needed basis.   Adrian Prows, MD, [facility] 12/12/2022, 6:03 PM Office: [phone] Fax: [phone] Pager: [phone]

## 2022-12-12 NOTE — Patient Instructions (Signed)
Reasons for reproducible pain explained to the patient. Heat to tender area. Cold ice compressions to reduce inflammation. Recommended Aleve OTC 1-2 tablets BID for 3-4 days and PRN. Reassured the patient.   

## 2022-12-14 ENCOUNTER — Ambulatory Visit
Admission: RE | Admit: 2022-12-14 | Discharge: 2022-12-14 | Disposition: A | Discharge status: Home / Self Care | Payer: Commercial Managed Care - PPO | Source: Ambulatory Visit | Attending: Emergency Medicine | Admitting: Emergency Medicine

## 2022-12-14 DIAGNOSIS — R1011 Right upper quadrant pain: Secondary | ICD-10-CM | POA: Diagnosis not present

## 2022-12-14 DIAGNOSIS — R1010 Upper abdominal pain, unspecified: Secondary | ICD-10-CM

## 2022-12-17 DIAGNOSIS — K5904 Chronic idiopathic constipation: Secondary | ICD-10-CM | POA: Diagnosis not present

## 2022-12-17 DIAGNOSIS — K219 Gastro-esophageal reflux disease without esophagitis: Secondary | ICD-10-CM | POA: Diagnosis not present

## 2022-12-18 ENCOUNTER — Encounter: Payer: Self-pay | Admitting: Emergency Medicine

## 2022-12-18 ENCOUNTER — Ambulatory Visit: Payer: Commercial Managed Care - PPO | Admitting: Emergency Medicine

## 2022-12-18 ENCOUNTER — Other Ambulatory Visit (HOSPITAL_COMMUNITY): Payer: Self-pay

## 2022-12-18 VITALS — BP 126/76 | HR 67 | Temp 98.1°F | Ht 60.0 in | Wt 137.5 lb

## 2022-12-18 DIAGNOSIS — K5904 Chronic idiopathic constipation: Secondary | ICD-10-CM

## 2022-12-18 DIAGNOSIS — G4733 Obstructive sleep apnea (adult) (pediatric): Secondary | ICD-10-CM

## 2022-12-18 DIAGNOSIS — E785 Hyperlipidemia, unspecified: Secondary | ICD-10-CM

## 2022-12-18 DIAGNOSIS — K2100 Gastro-esophageal reflux disease with esophagitis, without bleeding: Secondary | ICD-10-CM | POA: Diagnosis not present

## 2022-12-18 DIAGNOSIS — E559 Vitamin D deficiency, unspecified: Secondary | ICD-10-CM | POA: Diagnosis not present

## 2022-12-18 DIAGNOSIS — G43019 Migraine without aura, intractable, without status migrainosus: Secondary | ICD-10-CM | POA: Diagnosis not present

## 2022-12-18 DIAGNOSIS — R7303 Prediabetes: Secondary | ICD-10-CM

## 2022-12-18 DIAGNOSIS — E039 Hypothyroidism, unspecified: Secondary | ICD-10-CM | POA: Diagnosis not present

## 2022-12-18 DIAGNOSIS — R0789 Other chest pain: Secondary | ICD-10-CM | POA: Diagnosis not present

## 2022-12-18 NOTE — Assessment & Plan Note (Addendum)
Sees Dr. Jacinto Halim on a regular basis Intolerant to statins Recently started on Zetia 10 mg daily Diet and nutrition discussed Cardiovascular risk associated with dyslipidemia discussed Benefits of exercise discussed The 10-year ASCVD risk score (Arnett DK, et al., 2019) is: 3.6%   Values used to calculate the score:     Age: 57 years     Sex: Female     Is Non-Hispanic African American: Yes     Diabetic: No     Tobacco smoker: No     Systolic Blood Pressure: 126 mmHg     Is BP treated: No     HDL Cholesterol: 82.6 mg/dL     Total Cholesterol: 275 mg/dL

## 2022-12-18 NOTE — Assessment & Plan Note (Signed)
Asymptomatic.  Off medications Normal recent upper abdominal ultrasound.  No gallstones

## 2022-12-18 NOTE — Assessment & Plan Note (Signed)
Well-controlled and much less frequent episodes

## 2022-12-18 NOTE — Assessment & Plan Note (Signed)
Not on CPAP treatment.  No concerns.

## 2022-12-18 NOTE — Assessment & Plan Note (Signed)
Diet and nutrition discussed Advised to decrease amount of daily carbohydrate intake and daily calories and increase amount of plant-based protein in her diet Cardiovascular risks associated with diabetes discussed

## 2022-12-18 NOTE — Patient Instructions (Signed)

## 2022-12-18 NOTE — Assessment & Plan Note (Signed)
Clinically euthyroid. Lab Results  Component Value Date   TSH 0.97 06/12/2022  Continues Synthroid 75 mcg daily

## 2022-12-18 NOTE — Assessment & Plan Note (Signed)
Improved.  Advised to increase fiber amount in her diet and stay well-hydrated.

## 2022-12-18 NOTE — Progress Notes (Signed)
Chelsey Hunt 57 y.o.   Chief Complaint  Patient presents with   Medical Management of Chronic Issues    f/u appt, no concerns     HISTORY OF PRESENT ILLNESS: This is a 57 y.o. female A1A here for 105-month follow-up of chronic medical conditions Overall doing well.  Has no complaints or medical concerns today.  HPI   Prior to Admission medications   Medication Sig Start Date End Date Taking? Authorizing Provider  acetaminophen (TYLENOL) 325 MG tablet Take 650 mg by mouth every 6 (six) hours as needed for mild pain.   Yes [provider]  Cholecalciferol (D3 2000 PO) Take by mouth.   Yes [provider]  co-enzyme Q-10 30 MG capsule Take 200 mg by mouth 3 (three) times daily.   Yes [provider]  cyclobenzaprine (FLEXERIL) 5 MG tablet Take 1 tablet (5 mg total) by mouth 3 (three) times daily as needed for muscle spasms. 01/23/22  Yes Jaffe, Adam R, DO  dicyclomine (BENTYL) 10 MG capsule Take 1 capsule (10 mg total) by mouth 3 (three) times daily as needed. 08/24/21  Yes   dicyclomine (BENTYL) 10 MG capsule Take 1 capsule (10 mg total) by mouth 3 times daily. as needed 11/07/22  Yes   ezetimibe (ZETIA) 10 MG tablet Take 1 tablet (10 mg total) by mouth every evening. 12/12/22 12/07/23 Yes Yates Decamp, MD  ibuprofen (ADVIL,MOTRIN) 200 MG tablet Take 200 mg by mouth every 8 (eight) hours as needed.   Yes [provider]  levothyroxine (SYNTHROID) 75 MCG tablet Take 1 tablet (75 mcg total) by mouth daily. 08/20/22  Yes Gabryel Talamo, Eilleen Kempf, MD  magnesium 30 MG tablet Take 30 mg by mouth daily.   Yes [provider]  promethazine (PHENERGAN) 25 MG tablet Take 1 tablet (25 mg total) by mouth every 6 (six) hours as needed for nausea or vomiting. 10/29/22  Yes Jaffe, Adam R, DO  Ubrogepant (UBRELVY) 100 MG TABS Take 1 tablet (100 mg) by mouth as needed. May repeat dose in 2 hours.  Maximum 2 tablets in 24 hours. 10/29/22  Yes Everlena Cooper, Adam R, DO   valACYclovir (VALTREX) 1000 MG tablet Take 1 tablet (1,000 mg total) by mouth every 12 (twelve) hours for 5 days 09/25/21  Yes   valACYclovir (VALTREX) 1000 MG tablet Take 1 tablet (1,000 mg total) by mouth every 12 (twelve) hours for 5 days 09/25/22  Yes     Allergies  Allergen Reactions   Imitrex [Sumatriptan] Palpitations   Reglan [Metoclopramide] Palpitations   Rosuvastatin Other (See Comments)    Arthralgia and myalgia   Propoxyphene N-Acetaminophen    Nabumetone Rash    REACTION: GI upset   Topiramate Rash    REACTION: rash    Patient Active Problem List   Diagnosis Date Noted   Musculoskeletal neck pain 06/12/2022   Cervical radiculopathy 04/10/2022   Statin intolerance 03/08/2022   Family history of heart disease 11/15/2021   Obstructive sleep apnea 09/07/2021   Prediabetes 09/07/2021   Dyslipidemia 08/14/2021   Positive ANA (antinuclear antibody) 04/20/2021   Finger deformity 04/20/2021   Raynaud phenomenon 09/17/2019   Anxiety 07/30/2019   Chronic idiopathic constipation 01/08/2019   Psychophysiological insomnia 07/07/2018   Seasonal allergic rhinitis due to pollen 08/08/2017   Intractable migraine without aura and without status migrainosus 07/02/2016   Pancreas divisum of native pancreas 12/27/2014   Uterine leiomyoma 09/26/2012   GERD (gastroesophageal reflux disease)    Hypothyroidism 08/12/2009  Past Medical History:  Diagnosis Date   ACID REFLUX DISEASE 01/03/2008   Anemia    ANEMIA, IRON DEFICIENCY 12/23/2008   history   Anxiety    Chronic tension type headache 01/13/2009   migraines   Complication of anesthesia    pt states she just needs a small amount or she will sleep too long   Dysplasia of cervix, low grade (CIN 1)    Fibroid    GERD (gastroesophageal reflux disease)    HEART MURMUR, SYSTOLIC 07/07/2010   will have echo 06/18/2013   HYPOTHYROIDISM, POST-RADIATION 08/12/2009   Irritable bowel syndrome 12/23/2008   Migraine    Ovarian cyst     Ovarian cyst    THYROID NODULE, RIGHT 04/21/2009    Past Surgical History:  Procedure Laterality Date   ABDOMINAL HYSTERECTOMY     COLONOSCOPY     HYSTEROSCOPY     LAPAROSCOPY     NOVASURE ABLATION     POLYPECTOMY     TUBAL LIGATION     UPPER GI ENDOSCOPY     VAGINAL HYSTERECTOMY N/A 06/24/2013   Procedure: TOTAL HYSTERECTOMY VAGINAL;  Surgeon: Esmeralda ArthurSandra A Rivard, MD;  Location: [facility];  Service: Gynecology;  Laterality: N/A;    Social History   Socioeconomic History   Marital status: Married    Spouse name: Onalee HuaDavid   Number of children: 3   Years of education: Not on file   Highest education level: Some college, no degree  Occupational History   Occupation: WOMENS    Employer: [employer]  Tobacco Use   Smoking status: Never    Passive exposure: Past   Smokeless tobacco: Never  Vaping Use   Vaping Use: Never used  Substance and Sexual Activity   Alcohol use: No   Drug use: No   Sexual activity: Yes    Birth control/protection: Surgical    Comment: BTL  Other Topics Concern   Not on file  Social History Narrative   Regular exercise-yes      Patient is right-handed.She lives with her husband in a 1 story home. She has recently been avoiding caffeine. Walks.   Social Determinants of Health   Financial Resource Strain: Patient Declined (12/15/2022)   Overall Financial Resource Strain (CARDIA)    Difficulty of Paying Living Expenses: Patient declined  Food Insecurity: No Food Insecurity (12/15/2022)   Hunger Vital Sign    Worried About Running Out of Food in the Last Year: Never true    Ran Out of Food in the Last Year: Never true  Transportation Needs: No Transportation Needs (12/15/2022)   PRAPARE - Administrator, Civil ServiceTransportation    Lack of Transportation (Medical): No    Lack of Transportation (Non-Medical): No  Physical Activity: Sufficiently Active (12/15/2022)   Exercise Vital Sign    Days of Exercise per Week: 2 days    Minutes of Exercise per Session: 90 min  Stress: No  Stress Concern Present (12/15/2022)   [employer] - Occupational Stress Questionnaire    Feeling of Stress : Only a little  Social Connections: Socially Integrated (12/15/2022)   Social Connection and Isolation Panel [NHANES]    Frequency of Communication with Friends and Family: More than three times a week    Frequency of Social Gatherings with Friends and Family: Once a week    Attends Religious Services: More than 4 times per year    Active Member of [employer] or Organizations: Yes    Attends Banker[employer] or Organization Meetings: More than  4 times per year    Marital Status: Married  Catering manager Violence: Not on file    Family History  Problem Relation Age of Onset   Heart disease Mother    Thyroid disease Mother    Heart attack Mother    Cancer Father    Heart disease Father    Heart attack Father    Throat cancer Father    Lung cancer Father    Alcoholism Father    Graves' disease Sister    Asthma Sister    Hepatitis Sister    Stomach cancer Sister    CVA Brother    Alzheimer's disease Brother    Goiter Brother    Hypertension Brother    Hypertension Brother    Alcoholism Brother    Hyperlipidemia Maternal Aunt    Hypertension Paternal Grandfather    Healthy Daughter    GER disease Son    GER disease Son    Goiter Other        Siblings 2   Depression Neg Hx    Alcohol abuse Neg Hx    Drug abuse Neg Hx    Early death Neg Hx    Hearing loss Neg Hx    Kidney disease Neg Hx    Stroke Neg Hx      Review of Systems  Constitutional: Negative.  Negative for chills and fever.  HENT: Negative.  Negative for congestion and sore throat.   Respiratory: Negative.  Negative for cough and shortness of breath.   Cardiovascular: Negative.  Negative for chest pain and palpitations.  Gastrointestinal: Negative.  Negative for abdominal pain, diarrhea, nausea and vomiting.  Genitourinary: Negative.  Negative for dysuria and hematuria.  Skin: Negative.   Negative for rash.  Neurological: Negative.  Negative for dizziness and headaches.  All other systems reviewed and are negative.   Vitals:   12/18/22 1325  BP: 126/76  Pulse: 67  Temp: 98.1 F (36.7 C)  SpO2: 98%    Physical Exam Vitals reviewed.  Constitutional:      Appearance: Normal appearance.  HENT:     Head: Normocephalic.     Mouth/Throat:     Mouth: Mucous membranes are moist.     Pharynx: Oropharynx is clear.  Eyes:     Extraocular Movements: Extraocular movements intact.     Pupils: Pupils are equal, round, and reactive to light.  Cardiovascular:     Rate and Rhythm: Normal rate and regular rhythm.     Pulses: Normal pulses.     Heart sounds: Normal heart sounds.  Pulmonary:     Effort: Pulmonary effort is normal.     Breath sounds: Normal breath sounds.  Musculoskeletal:     Cervical back: No tenderness.     Right lower leg: No edema.     Left lower leg: No edema.  Lymphadenopathy:     Cervical: No cervical adenopathy.  Skin:    General: Skin is warm and dry.  Neurological:     General: No focal deficit present.     Mental Status: She is alert and oriented to person, place, and time.  Psychiatric:        Mood and Affect: Mood normal.        Behavior: Behavior normal.      ASSESSMENT & PLAN: A total of 42 minutes was spent with the patient and counseling/coordination of care regarding preparing for this visit, review of most recent office visit notes, review of most recent cardiologist office  visit notes, review of multiple chronic medical conditions under management, review of all medications, review of most recent blood work results, education on nutrition, cardiovascular risks associated with prediabetes, dyslipidemia, prognosis, documentation, and need for follow-up.  Problem List Items Addressed This Visit       Cardiovascular and Mediastinum   Intractable migraine without aura and without status migrainosus    Well-controlled and much less  frequent episodes        Respiratory   Obstructive sleep apnea    Not on CPAP treatment.  No concerns.        Digestive   GERD (gastroesophageal reflux disease)    Asymptomatic.  Off medications Normal recent upper abdominal ultrasound.  No gallstones      Chronic idiopathic constipation    Improved.  Advised to increase fiber amount in her diet and stay well-hydrated.        Endocrine   Hypothyroidism    Clinically euthyroid. Lab Results  Component Value Date   TSH 0.97 06/12/2022  Continues Synthroid 75 mcg daily         Other   Dyslipidemia - Primary    Sees Dr. Jacinto Halim on a regular basis Intolerant to statins Recently started on Zetia 10 mg daily Diet and nutrition discussed Cardiovascular risk associated with dyslipidemia discussed Benefits of exercise discussed The 10-year ASCVD risk score (Arnett DK, et al., 2019) is: 3.6%   Values used to calculate the score:     Age: 28 years     Sex: Female     Is Non-Hispanic African American: Yes     Diabetic: No     Tobacco smoker: No     Systolic Blood Pressure: 126 mmHg     Is BP treated: No     HDL Cholesterol: 82.6 mg/dL     Total Cholesterol: 275 mg/dL       Prediabetes    Diet and nutrition discussed Advised to decrease amount of daily carbohydrate intake and daily calories and increase amount of plant-based protein in her diet Cardiovascular risks associated with diabetes discussed      Patient Instructions  Health Maintenance, Female Adopting a healthy lifestyle and getting preventive care are important in promoting health and wellness. Ask your health care provider about: The right schedule for you to have regular tests and exams. Things you can do on your own to prevent diseases and keep yourself healthy. What should I know about diet, weight, and exercise? Eat a healthy diet  Eat a diet that includes plenty of vegetables, fruits, low-fat dairy products, and lean protein. Do not eat a lot of  foods that are high in solid fats, added sugars, or sodium. Maintain a healthy weight Body mass index (BMI) is used to identify weight problems. It estimates body fat based on height and weight. Your health care provider can help determine your BMI and help you achieve or maintain a healthy weight. Get regular exercise Get regular exercise. This is one of the most important things you can do for your health. Most adults should: Exercise for at least 150 minutes each week. The exercise should increase your heart rate and make you sweat (moderate-intensity exercise). Do strengthening exercises at least twice a week. This is in addition to the moderate-intensity exercise. Spend less time sitting. Even light physical activity can be beneficial. Watch cholesterol and blood lipids Have your blood tested for lipids and cholesterol at 58 years of age, then have this test every 5 years. Have your  cholesterol levels checked more often if: Your lipid or cholesterol levels are high. You are older than 58 years of age. You are at high risk for heart disease. What should I know about cancer screening? Depending on your health history and family history, you may need to have cancer screening at various ages. This may include screening for: Breast cancer. Cervical cancer. Colorectal cancer. Skin cancer. Lung cancer. What should I know about heart disease, diabetes, and high blood pressure? Blood pressure and heart disease High blood pressure causes heart disease and increases the risk of stroke. This is more likely to develop in people who have high blood pressure readings or are overweight. Have your blood pressure checked: Every 3-5 years if you are 41-19 years of age. Every year if you are 28 years old or older. Diabetes Have regular diabetes screenings. This checks your fasting blood sugar level. Have the screening done: Once every three years after age 45 if you are at a normal weight and have a  low risk for diabetes. More often and at a younger age if you are overweight or have a high risk for diabetes. What should I know about preventing infection? Hepatitis B If you have a higher risk for hepatitis B, you should be screened for this virus. Talk with your health care provider to find out if you are at risk for hepatitis B infection. Hepatitis C Testing is recommended for: Everyone born from [birth year] through 1966. Anyone with known risk factors for hepatitis C. Sexually transmitted infections (STIs) Get screened for STIs, including gonorrhea and chlamydia, if: You are sexually active and are younger than 58 years of age. You are older than 58 years of age and your health care provider tells you that you are at risk for this type of infection. Your sexual activity has changed since you were last screened, and you are at increased risk for chlamydia or gonorrhea. Ask your health care provider if you are at risk. Ask your health care provider about whether you are at high risk for HIV. Your health care provider may recommend a prescription medicine to help prevent HIV infection. If you choose to take medicine to prevent HIV, you should first get tested for HIV. You should then be tested every 3 months for as long as you are taking the medicine. Pregnancy If you are about to stop having your period (premenopausal) and you may become pregnant, seek counseling before you get pregnant. Take 400 to 800 micrograms (mcg) of folic acid every day if you become pregnant. Ask for birth control (contraception) if you want to prevent pregnancy. Osteoporosis and menopause Osteoporosis is a disease in which the bones lose minerals and strength with aging. This can result in bone fractures. If you are 70 years old or older, or if you are at risk for osteoporosis and fractures, ask your health care provider if you should: Be screened for bone loss. Take a calcium or vitamin D supplement to lower your risk of  fractures. Be given hormone replacement therapy (HRT) to treat symptoms of menopause. Follow these instructions at home: Alcohol use Do not drink alcohol if: Your health care provider tells you not to drink. You are pregnant, may be pregnant, or are planning to become pregnant. If you drink alcohol: Limit how much you have to: 0-1 drink a day. Know how much alcohol is in your drink. In the [place] one drink equals one 12 oz bottle of beer (355 mL), one 5 oz glass  of wine (148 mL), or one 1 oz glass of hard liquor (44 mL). Lifestyle Do not use any products that contain nicotine or tobacco. These products include cigarettes, chewing tobacco, and vaping devices, such as e-cigarettes. If you need help quitting, ask your health care provider. Do not use street drugs. Do not share needles. Ask your health care provider for help if you need support or information about quitting drugs. General instructions Schedule regular health, dental, and eye exams. Stay current with your vaccines. Tell your health care provider if: You often feel depressed. You have ever been abused or do not feel safe at home. Summary Adopting a healthy lifestyle and getting preventive care are important in promoting health and wellness. Follow your health care provider's instructions about healthy diet, exercising, and getting tested or screened for diseases. Follow your health care provider's instructions on monitoring your cholesterol and blood pressure. This information is not intended to replace advice given to you by your health care provider. Make sure you discuss any questions you have with your health care provider. Document Revised: 01/16/2021 Document Reviewed: 01/16/2021 Elsevier Patient Education  2023 Elsevier Inc.    Edwina Barth, MD [facility]

## 2022-12-19 LAB — VITAMIN D 25 HYDROXY (VIT D DEFICIENCY, FRACTURES): Vit D, 25-Hydroxy: 37 ng/mL (ref 30.0–100.0)

## 2022-12-20 ENCOUNTER — Other Ambulatory Visit (HOSPITAL_COMMUNITY): Payer: Self-pay

## 2023-01-25 ENCOUNTER — Other Ambulatory Visit (HOSPITAL_COMMUNITY): Payer: Self-pay

## 2023-03-18 ENCOUNTER — Encounter: Payer: Self-pay | Admitting: Podiatry

## 2023-03-18 ENCOUNTER — Ambulatory Visit: Payer: Commercial Managed Care - PPO | Admitting: Podiatry

## 2023-03-18 DIAGNOSIS — M7752 Other enthesopathy of left foot: Secondary | ICD-10-CM | POA: Diagnosis not present

## 2023-03-18 MED ORDER — TRIAMCINOLONE ACETONIDE 10 MG/ML IJ SUSP
10.0000 mg | Freq: Once | INTRAMUSCULAR | Status: AC
  Administered 2023-03-18: 10 mg

## 2023-03-18 NOTE — Progress Notes (Signed)
Subjective:   Patient ID: Chelsey Hunt, female   DOB: 57 y.o.   MRN: [id number]   HPI Patient presents with a lot of pain in the outside of the left foot states it was doing very well and just started recently   ROS      Objective:  Physical Exam  Neurovascular status intact inflammation around the head of the fifth metatarsal left that has been about 8 months     Assessment:  Inflammatory capsulitis of the fifth MPJ left     Plan:  Sterile prep reinjected the capsule of the fifth MPJ 2 mg Dexasone Kenalog 5 mg Xylocaine advised on wider shoes reappoint as symptoms indicate.  May eventually require surgery

## 2023-03-21 DIAGNOSIS — Z0279 Encounter for issue of other medical certificate: Secondary | ICD-10-CM

## 2023-03-22 ENCOUNTER — Telehealth: Payer: Self-pay

## 2023-03-22 NOTE — Telephone Encounter (Signed)
FMLA Paperwork filled out and signed.  Will fax over to [facility]

## 2023-03-27 NOTE — Progress Notes (Signed)
NEUROLOGY FOLLOW UP OFFICE NOTE  Chelsey Hunt [id number]  Assessment/Plan:   Migraine without aura, without status migrainosus, not intractable  Myofascial pain    1 She is hesitant about starting a new preventative medication.  I directed her to consider Cefaly.  I also suggested Vyepti or Qulipta for alternative preventative medications.  She will look into these recommendations.  2  Ubrelvy as needed  3  Cyclobenzaprine for neck pain  4  Advised to contact Dr. Teofilo Pod office at the sleep clinic at [facility] to follow up on treatment for her OSA. 5  Follow up in 6 months.   Subjective:  Chelsey Hunt is a 57 year old right-handed woman with hypothyroidism who follows up for migraines.   UPDATE: Fluctuates based on weather.  Currently, she has 7 migraines a month (2 were severe)  Severe migraines last 2-3 hours, mild-moderate last 1-2 hours with Bernita Raisin.   Current NSAIDS: ASA 81mg  Current analgesics: none Current triptans: None Current ergotamine: None Current abortive anti-CGRP:  none Current anti-emetic: Promethazine Current muscle relaxants: Cyclobenzaprine 10 mg (neck pain) Current anti-anxiolytic: None Current sleep aide: None Current Antihypertensive medications: None Current Antidepressant medications: none Current Anticonvulsant medications: gabapentin 100mg  QHS (for scalp neuralgia; cannot tolerate higher doses) Current anti-CGRP: Ubrelvy 100mg  Current Vitamins/Herbal/Supplements: magnesium Current Antihistamines/Decongestants: None Other therapy: Physical therapy for neck pain; dry needling, chiropractor Hormone/birth control: None   Caffeine: No Diet: increased water intake.  1/2 gallon water daily.   Exercise: She is now exercising - treadmill and bike 2 days a week.  Trying to increase frequency  Depression: No; Anxiety: Yes Other pain:  neck pain.  She has cervical spondylosis Sleep hygiene: She has OSA.  She never was able to pick up the CPAP    HISTORY: Onset: First migraine was between 6 and 48 years old.  Migraine free until 2004 during her menstrual cycle.  Chronic for many years. Location:  Left more than right. Quality:  throbbing Initial intensity:  Severe.   Aura:  Initially no.  Later developed visual aura of "strands of hair floating" in her vision. Prodrome:  no Postdrome:  fatigue Associated symptoms:  Right facial tingling, nausea, photophobia, phonophobia, osmophobia.  She denies associated vomiting, visual disturbance or unilateral numbness or weakness. Initial duration:  3 days Initial Frequency:  daily Initial Frequency of abortive medication: Advil or Tylenol daily, Maxalt 2 days a week Triggers:  Certain smells, change in barometric pressure, sausages, certain cheese, bananas; brazilian nutes, COVID-19 (contracted in September 2020) Relieving factors: Exercise, staying hydrated Activity:  aggravates   During summer 2019, she has had throbbing occipital headache, right-sided neck pain radiating up to the occiput, a burning scalp neuralgia, associated with tinnitus, sometimes left or right sided facial numbness, facial twitching.  She started having a panic attack with palpitations.  She went to the ED on 06/26/18 for symptoms.  EKG showed sinus tachycardia.  She was treated with headache cocktail.  To evaluate worsening headaches, she had an MRI of the brain on 11/21/2018 which demonstrated nonspecific white matter hyperintensities in the cerebral white matter, likely chronic small vessel ischemic changes, but no acute abnormality.  For further evaluation of neck pain, cervical spine X-ray on 04/10/2019 showed slight refersal of the normal cervical lordosis with minimal retrolisthesis of C5 on C6 and cervical spondylosis with osteophytes most notable at C4-5 and C5-6.     Due to fatigue, her PCP checked for autoimmune condition.  ANA was positive with positive cyclic citrullin peptide  antibody.  She has been referred to  rheumatology.  She saw sleep medicine.  She was found to have moderate OSA and was ordered auto-PAP.    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   She has bilateral tinnitus.  Exam by ENT unremarkable.    Past NSAIDS: Ibuprofen, naproxen, Cambia Past analgesics:  Excedrin, Lidocaine nasal Past abortive triptans: Relpax, sumatriptan tablet, Maxalt, Tosymra NS, Zembrace.   Past abortive ergotamine: Migranal Past muscle relaxants:  none Past anti-emetic:  Zofran Past antihypertensive medications: Verapamil, propranolol Past antidepressant medications: Imipramine, nortriptyline Past anticonvulsant medications:  topiramate Past anti-CGRP:  Aimovig (aggravated migraine), Emgality (ineffective), Ajovy, Nurtec Past vitamins/Herbal/Supplements: Riboflavin, CoQ10 Past antihistamines/decongestants:  None Other past therapies:  acupuncture (triggered migraine), Reyvow   Family history of headache:  Mom (migraines), daughter (migraines)   MRI and MRA of the head from 03/09/2009 revealed nonspecific small subcortical white matter hyperintensities and 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  MRI of the brain on 11/21/2018 which demonstrated nonspecific white matter hyperintensities in the cerebral white matter.  To evaluate new visual aura with migraine (hair floating in her vision), MRI of brain with and without contrast performed on 02/10/2020 again demonstrated non-enhancing white matter T2 and FLAIR signal in the cerebral hemispheres, stable compared to prior imaging from 2020.  To further evaluate abnormal white matter on brain MRI, she had MRI of cervical and thoracic spine without contrast on 03/26/2020 which showed no cord lesions but did show incidental 1 cm T1 and T2 dark ovoid lesion in the T6 vertebral body.  Follow up MRI thoracic spine with contrast on 05/25/2020  showed no change and no enhancement, thought to represent benign marrow focus .  Vasculitis workup, including SSA/SSB antibodies and pan-ANCA panel, was negative.  Sed rate was 14 and CRP negative.She was evaluated by ophthalmology in September 2021 which demonstrated dry eye but no concerning findings such as optic neuritis.  PAST MEDICAL HISTORY: Past Medical History:  Diagnosis Date   ACID REFLUX DISEASE 01/03/2008   Anemia    ANEMIA, IRON DEFICIENCY 12/23/2008   history   Anxiety    Chronic tension type headache 01/13/2009   migraines   Complication of anesthesia    pt states she just needs a small amount or she will sleep too long   Dysplasia of cervix, low grade (CIN 1)    Fibroid    GERD (gastroesophageal reflux disease)    HEART MURMUR, SYSTOLIC 07/07/2010   will have echo 06/18/2013   HYPOTHYROIDISM, POST-RADIATION 08/12/2009   Irritable bowel syndrome 12/23/2008   Migraine    Ovarian cyst    Ovarian cyst    THYROID NODULE, RIGHT 04/21/2009    MEDICATIONS: Current Outpatient Medications on File Prior to Visit  Medication Sig Dispense Refill   acetaminophen (TYLENOL) 325 MG tablet Take 650 mg by mouth every 6 (six) hours as needed for mild pain.     Cholecalciferol (D3 2000 PO) Take by mouth.     co-enzyme Q-10 30 MG capsule Take 200 mg by mouth 3 (three) times daily.     cyclobenzaprine (FLEXERIL) 5 MG tablet Take 1 tablet (5 mg total) by mouth 3 (three) times daily as needed for muscle spasms. 30 tablet 5   dicyclomine (BENTYL) 10 MG capsule Take 1 capsule (10 mg total) by mouth 3 (three) times daily as needed. 90 capsule 3   dicyclomine (BENTYL) 10  MG capsule Take 1 capsule (10 mg total) by mouth 3 times daily. as needed 90 capsule 2   ezetimibe (ZETIA) 10 MG tablet Take 1 tablet (10 mg total) by mouth every evening. 30 tablet 2   ibuprofen (ADVIL,MOTRIN) 200 MG tablet Take 200 mg by mouth every 8 (eight) hours as needed.     levothyroxine (SYNTHROID) 75 MCG tablet Take 1  tablet (75 mcg total) by mouth daily. 90 tablet 3   magnesium 30 MG tablet Take 30 mg by mouth daily.     promethazine (PHENERGAN) 25 MG tablet Take 1 tablet (25 mg total) by mouth every 6 (six) hours as needed for nausea or vomiting. 30 tablet 5   Ubrogepant (UBRELVY) 100 MG TABS Take 1 tablet (100 mg) by mouth as needed. May repeat dose in 2 hours.  Maximum 2 tablets in 24 hours. 16 tablet 5   valACYclovir (VALTREX) 1000 MG tablet Take 1 tablet (1,000 mg total) by mouth every 12 (twelve) hours for 5 days 30 tablet 11   valACYclovir (VALTREX) 1000 MG tablet Take 1 tablet (1,000 mg total) by mouth every 12 (twelve) hours for 5 days 30 tablet 11   No current facility-administered medications on file prior to visit.    ALLERGIES: Allergies  Allergen Reactions   Imitrex [Sumatriptan] Palpitations   Reglan [Metoclopramide] Palpitations   Rosuvastatin Other (See Comments)    Arthralgia and myalgia   Propoxyphene N-Acetaminophen    Nabumetone Rash    REACTION: GI upset   Topiramate Rash    REACTION: rash    FAMILY HISTORY: Family History  Problem Relation Age of Onset   Heart disease Mother    Thyroid disease Mother    Heart attack Mother    Cancer Father    Heart disease Father    Heart attack Father    Throat cancer Father    Lung cancer Father    Alcoholism Father    Graves' disease Sister    Asthma Sister    Hepatitis Sister    Stomach cancer Sister    CVA Brother    Alzheimer's disease Brother    Goiter Brother    Hypertension Brother    Hypertension Brother    Alcoholism Brother    Hyperlipidemia Maternal Aunt    Hypertension Paternal Grandfather    Healthy Daughter    GER disease Son    GER disease Son    Goiter Other        Siblings 2   Depression Neg Hx    Alcohol abuse Neg Hx    Drug abuse Neg Hx    Early death Neg Hx    Hearing loss Neg Hx    Kidney disease Neg Hx    Stroke Neg Hx       Objective:  Blood pressure 139/78, pulse 87, resp. rate 18,  height 5' (1.524 m), weight 139 lb (63 kg), last menstrual period 04/19/2013, SpO2 98%. General: No acute distress.  Patient appears well-groomed.   Head:  Normocephalic/atraumatic Head:  Normocephalic/atraumatic Neck:  Supple.  Mild paraspinal tenderness.  Full range of motion. Heart:  Regular rate and rhythm. Neuro:  Alert and oriented.  Speech fluent and not dysarthric.  Language intact.  CN II-XII intact.  Bulk and tone normal.  Muscle strength 5/5 throughout.  Deep tendon reflexes 2+ throughout.  Gait normal.  Romberg negative.    Shon Millet, DO  CC: Georgina Quint, MD

## 2023-04-01 ENCOUNTER — Encounter: Payer: Self-pay | Admitting: Neurology

## 2023-04-01 ENCOUNTER — Other Ambulatory Visit (HOSPITAL_COMMUNITY): Payer: Self-pay

## 2023-04-01 ENCOUNTER — Ambulatory Visit: Payer: Commercial Managed Care - PPO | Admitting: Neurology

## 2023-04-01 ENCOUNTER — Other Ambulatory Visit: Payer: Self-pay | Admitting: Emergency Medicine

## 2023-04-01 VITALS — BP 139/78 | HR 87 | Resp 18 | Ht 60.0 in | Wt 139.0 lb

## 2023-04-01 DIAGNOSIS — E7800 Pure hypercholesterolemia, unspecified: Secondary | ICD-10-CM

## 2023-04-01 DIAGNOSIS — M7918 Myalgia, other site: Secondary | ICD-10-CM | POA: Diagnosis not present

## 2023-04-01 DIAGNOSIS — G43009 Migraine without aura, not intractable, without status migrainosus: Secondary | ICD-10-CM | POA: Diagnosis not present

## 2023-04-01 MED ORDER — EZETIMIBE 10 MG PO TABS
10.0000 mg | ORAL_TABLET | Freq: Every evening | ORAL | 2 refills | Status: DC
Start: 2023-04-01 — End: 2023-05-14
  Filled 2023-04-01: qty 30, 30d supply, fill #0
  Filled 2023-05-03: qty 30, 30d supply, fill #1

## 2023-04-01 NOTE — Patient Instructions (Signed)
Cyclobenzaprine and Ubrelvy refilled Contact Dr. Teofilo Pod office at [facility] to discuss treatment for sleep apnea Limit use of pain relievers to no more than 2 days out of week to prevent risk of rebound or medication-overuse headache. Keep headache diary Consider Cefaly device, Vyepti or Qulipta Follow up 6 months.

## 2023-04-02 ENCOUNTER — Telehealth: Payer: Self-pay | Admitting: Neurology

## 2023-04-02 NOTE — Telephone Encounter (Signed)
Paperwork fax over already last week.  Had front desk make a copy to give to Patient.

## 2023-04-02 NOTE — Telephone Encounter (Signed)
Patient's husband is calling for FMLA papers that need to be faxed . Patients work is expecting the papers today/KB

## 2023-04-08 ENCOUNTER — Other Ambulatory Visit (HOSPITAL_COMMUNITY): Payer: Self-pay

## 2023-04-08 ENCOUNTER — Encounter: Payer: Self-pay | Admitting: Cardiology

## 2023-04-08 MED ORDER — PANTOPRAZOLE SODIUM 20 MG PO TBEC
20.0000 mg | DELAYED_RELEASE_TABLET | Freq: Two times a day (BID) | ORAL | 1 refills | Status: DC
  Filled 2023-04-08: qty 180, 90d supply, fill #0
  Filled 2023-07-02: qty 180, 90d supply, fill #1

## 2023-04-08 NOTE — Telephone Encounter (Signed)
From patient.

## 2023-05-03 ENCOUNTER — Other Ambulatory Visit (HOSPITAL_COMMUNITY): Payer: Self-pay

## 2023-05-08 DIAGNOSIS — H524 Presbyopia: Secondary | ICD-10-CM | POA: Diagnosis not present

## 2023-05-14 ENCOUNTER — Other Ambulatory Visit: Payer: Self-pay

## 2023-05-14 ENCOUNTER — Other Ambulatory Visit (HOSPITAL_COMMUNITY): Payer: Self-pay

## 2023-05-14 ENCOUNTER — Ambulatory Visit: Payer: Commercial Managed Care - PPO | Admitting: Emergency Medicine

## 2023-05-14 ENCOUNTER — Encounter: Payer: Self-pay | Admitting: Emergency Medicine

## 2023-05-14 VITALS — BP 118/70 | HR 68 | Temp 98.1°F | Resp 16 | Ht 60.0 in | Wt 138.4 lb

## 2023-05-14 DIAGNOSIS — E039 Hypothyroidism, unspecified: Secondary | ICD-10-CM

## 2023-05-14 DIAGNOSIS — Z130 Encounter for screening for diseases of the blood and blood-forming organs and certain disorders involving the immune mechanism: Secondary | ICD-10-CM

## 2023-05-14 DIAGNOSIS — Z0001 Encounter for general adult medical examination with abnormal findings: Secondary | ICD-10-CM

## 2023-05-14 DIAGNOSIS — Z0000 Encounter for general adult medical examination without abnormal findings: Secondary | ICD-10-CM

## 2023-05-14 DIAGNOSIS — E7800 Pure hypercholesterolemia, unspecified: Secondary | ICD-10-CM | POA: Diagnosis not present

## 2023-05-14 DIAGNOSIS — Z789 Other specified health status: Secondary | ICD-10-CM

## 2023-05-14 DIAGNOSIS — Z1329 Encounter for screening for other suspected endocrine disorder: Secondary | ICD-10-CM | POA: Diagnosis not present

## 2023-05-14 DIAGNOSIS — G43019 Migraine without aura, intractable, without status migrainosus: Secondary | ICD-10-CM

## 2023-05-14 DIAGNOSIS — E785 Hyperlipidemia, unspecified: Secondary | ICD-10-CM

## 2023-05-14 DIAGNOSIS — R7303 Prediabetes: Secondary | ICD-10-CM | POA: Diagnosis not present

## 2023-05-14 DIAGNOSIS — Z1231 Encounter for screening mammogram for malignant neoplasm of breast: Secondary | ICD-10-CM

## 2023-05-14 DIAGNOSIS — K219 Gastro-esophageal reflux disease without esophagitis: Secondary | ICD-10-CM

## 2023-05-14 DIAGNOSIS — Z13228 Encounter for screening for other metabolic disorders: Secondary | ICD-10-CM | POA: Diagnosis not present

## 2023-05-14 LAB — COMPREHENSIVE METABOLIC PANEL
ALT: 16 U/L (ref 0–35)
AST: 17 U/L (ref 0–37)
Albumin: 4.3 g/dL (ref 3.5–5.2)
Alkaline Phosphatase: 104 U/L (ref 39–117)
BUN: 10 mg/dL (ref 6–23)
CO2: 25 meq/L (ref 19–32)
Calcium: 10.2 mg/dL (ref 8.4–10.5)
Chloride: 102 meq/L (ref 96–112)
Creatinine, Ser: 0.93 mg/dL (ref 0.40–1.20)
GFR: 68.01 mL/min (ref >60.00)
Glucose, Bld: 76 mg/dL (ref 70–99)
Potassium: 3.7 meq/L (ref 3.5–5.1)
Sodium: 138 meq/L (ref 135–145)
Total Bilirubin: 0.4 mg/dL (ref 0.2–1.2)
Total Protein: 7.9 g/dL (ref 6.0–8.3)

## 2023-05-14 LAB — CBC WITH DIFFERENTIAL/PLATELET
Basophils Absolute: 0 10*3/uL (ref 0.0–0.1)
Basophils Relative: 0.4 % (ref 0.0–3.0)
Eosinophils Absolute: 0.3 10*3/uL (ref 0.0–0.7)
Eosinophils Relative: 5 % (ref 0.0–5.0)
HCT: 42.7 % (ref 36.0–46.0)
Hemoglobin: 13.7 g/dL (ref 12.0–15.0)
Lymphocytes Relative: 56.5 % — ABNORMAL HIGH (ref 12.0–46.0)
Lymphs Abs: 3.2 10*3/uL (ref 0.7–4.0)
MCHC: 32.2 g/dL (ref 30.0–36.0)
MCV: 92.8 fl (ref 78.0–100.0)
Monocytes Absolute: 0.3 10*3/uL (ref 0.1–1.0)
Monocytes Relative: 5.8 % (ref 3.0–12.0)
Neutro Abs: 1.9 10*3/uL (ref 1.4–7.7)
Neutrophils Relative %: 32.3 % — ABNORMAL LOW (ref 43.0–77.0)
Platelets: 404 10*3/uL — ABNORMAL HIGH (ref 150.0–400.0)
RBC: 4.6 Mil/uL (ref 3.87–5.11)
RDW: 14.4 % (ref 11.5–15.5)
WBC: 5.7 10*3/uL (ref 4.0–10.5)

## 2023-05-14 LAB — LIPID PANEL
Cholesterol: 213 mg/dL — ABNORMAL HIGH (ref 0–200)
HDL: 70.8 mg/dL (ref >39.00)
LDL Cholesterol: 124 mg/dL — ABNORMAL HIGH (ref 0–99)
NonHDL: 141.73
Total CHOL/HDL Ratio: 3
Triglycerides: 87 mg/dL (ref 0.0–149.0)
VLDL: 17.4 mg/dL (ref 0.0–40.0)

## 2023-05-14 LAB — HEMOGLOBIN A1C: Hgb A1c MFr Bld: 6.1 % (ref 4.6–6.5)

## 2023-05-14 MED ORDER — LEVOTHYROXINE SODIUM 75 MCG PO TABS
75.0000 ug | ORAL_TABLET | Freq: Every day | ORAL | 3 refills | Status: DC
  Filled 2023-05-14: qty 90, 90d supply, fill #0
  Filled 2023-09-05: qty 90, 90d supply, fill #1
  Filled 2023-11-19: qty 90, 90d supply, fill #2
  Filled 2024-02-27: qty 90, 90d supply, fill #3

## 2023-05-14 MED ORDER — EZETIMIBE 10 MG PO TABS
10.0000 mg | ORAL_TABLET | Freq: Every evening | ORAL | 3 refills | Status: DC
  Filled 2023-05-14 – 2023-06-07 (×2): qty 90, 90d supply, fill #0
  Filled 2023-11-19: qty 90, 90d supply, fill #1
  Filled 2024-02-27: qty 90, 90d supply, fill #2

## 2023-05-14 NOTE — Assessment & Plan Note (Signed)
Clinically stable.  Ubrelvy 100 mg as needed helping

## 2023-05-14 NOTE — Assessment & Plan Note (Signed)
Clinically euthyroid TSH done today Continue Synthroid 75 mcg daily Will adjust dose accordingly depending on level

## 2023-05-14 NOTE — Assessment & Plan Note (Signed)
Diet and nutrition discussed.  Hemoglobin A1c done today. 

## 2023-05-14 NOTE — Patient Instructions (Signed)

## 2023-05-14 NOTE — Assessment & Plan Note (Signed)
Chronic stable condition. Intolerant to statins Continue Zetia 10 mg daily Lipid profile done today

## 2023-05-14 NOTE — Assessment & Plan Note (Signed)
Stable.  Takes Protonix 20 mg daily as needed

## 2023-05-14 NOTE — Progress Notes (Signed)
Chelsey Hunt 57 y.o.   Chief Complaint  Patient presents with   Annual Exam    Pt would like medication to be sent in 3 months at a time.    HISTORY OF PRESENT ILLNESS: This is a 57 y.o. female A1A here for annual exam and follow-up on chronic medical conditions Overall doing well.  Has no complaints or medical concerns today.  HPI   Prior to Admission medications   Medication Sig Start Date End Date Taking? Authorizing Provider  acetaminophen (TYLENOL) 325 MG tablet Take 650 mg by mouth every 6 (six) hours as needed for mild pain.   Yes [provider]  Cholecalciferol (D3 2000 PO) Take by mouth.   Yes [provider]  co-enzyme Q-10 30 MG capsule Take 200 mg by mouth 3 (three) times daily.   Yes [provider]  cyclobenzaprine (FLEXERIL) 5 MG tablet Take 1 tablet (5 mg total) by mouth 3 (three) times daily as needed for muscle spasms. 01/23/22  Yes Jaffe, Adam R, DO  dicyclomine (BENTYL) 10 MG capsule Take 1 capsule (10 mg total) by mouth 3 (three) times daily as needed. 08/24/21  Yes   dicyclomine (BENTYL) 10 MG capsule Take 1 capsule (10 mg total) by mouth 3 times daily. as needed 11/07/22  Yes   ezetimibe (ZETIA) 10 MG tablet Take 1 tablet (10 mg total) by mouth every evening. 04/01/23 03/26/24 Yes Makell Drohan, Eilleen Kempf, MD  ibuprofen (ADVIL,MOTRIN) 200 MG tablet Take 200 mg by mouth every 8 (eight) hours as needed.   Yes [provider]  levothyroxine (SYNTHROID) 75 MCG tablet Take 1 tablet (75 mcg total) by mouth daily. 08/20/22  Yes Zriyah Kopplin, Eilleen Kempf, MD  magnesium 30 MG tablet Take 30 mg by mouth daily.   Yes [provider]  pantoprazole (PROTONIX) 20 MG tablet Take 1 tablet (20 mg total) by mouth 2 (two) times daily before a meal. Take 30-45 minutes prior to breakfast and dinner. 04/08/23  Yes   promethazine (PHENERGAN) 25 MG tablet Take 1 tablet (25 mg total) by mouth every 6 (six) hours as needed for nausea or vomiting.  10/29/22  Yes Jaffe, Adam R, DO  Ubrogepant (UBRELVY) 100 MG TABS Take 1 tablet (100 mg) by mouth as needed. May repeat dose in 2 hours.  Maximum 2 tablets in 24 hours. 10/29/22  Yes Everlena Cooper, Adam R, DO  valACYclovir (VALTREX) 1000 MG tablet Take 1 tablet (1,000 mg total) by mouth every 12 (twelve) hours for 5 days 09/25/21  Yes   valACYclovir (VALTREX) 1000 MG tablet Take 1 tablet (1,000 mg total) by mouth every 12 (twelve) hours for 5 days 09/25/22  Yes     Allergies  Allergen Reactions   Imitrex [Sumatriptan] Palpitations   Reglan [Metoclopramide] Palpitations   Rosuvastatin Other (See Comments)    Arthralgia and myalgia   Propoxyphene N-Acetaminophen    Nabumetone Rash    REACTION: GI upset   Topiramate Rash    REACTION: rash    Patient Active Problem List   Diagnosis Date Noted   Musculoskeletal neck pain 06/12/2022   Cervical radiculopathy 04/10/2022   Statin intolerance 03/08/2022   Family history of heart disease 11/15/2021   Obstructive sleep apnea 09/07/2021   Prediabetes 09/07/2021   Dyslipidemia 08/14/2021   Positive ANA (antinuclear antibody) 04/20/2021   Finger deformity 04/20/2021   Raynaud phenomenon 09/17/2019   Anxiety 07/30/2019   Chronic idiopathic constipation 01/08/2019   Psychophysiological insomnia 07/07/2018   Seasonal allergic rhinitis due  to pollen 08/08/2017   Intractable migraine without aura and without status migrainosus 07/02/2016   Pancreas divisum of native pancreas 12/27/2014   Uterine leiomyoma 09/26/2012   GERD (gastroesophageal reflux disease)    Hypothyroidism 08/12/2009    Past Medical History:  Diagnosis Date   ACID REFLUX DISEASE 01/03/2008   Anemia    ANEMIA, IRON DEFICIENCY 12/23/2008   history   Anxiety    Chronic tension type headache 01/13/2009   migraines   Complication of anesthesia    pt states she just needs a small amount or she will sleep too long   Dysplasia of cervix, low grade (CIN 1)    Fibroid    GERD  (gastroesophageal reflux disease)    HEART MURMUR, SYSTOLIC 07/07/2010   will have echo 06/18/2013   HYPOTHYROIDISM, POST-RADIATION 08/12/2009   Irritable bowel syndrome 12/23/2008   Migraine    Ovarian cyst    Ovarian cyst    THYROID NODULE, RIGHT 04/21/2009    Past Surgical History:  Procedure Laterality Date   ABDOMINAL HYSTERECTOMY     COLONOSCOPY     HYSTEROSCOPY     LAPAROSCOPY     NOVASURE ABLATION     POLYPECTOMY     TUBAL LIGATION     UPPER GI ENDOSCOPY     VAGINAL HYSTERECTOMY N/A 06/24/2013   Procedure: TOTAL HYSTERECTOMY VAGINAL;  Surgeon: Esmeralda Arthur, MD;  Location: [facility];  Service: Gynecology;  Laterality: N/A;    Social History   Socioeconomic History   Marital status: Married    Spouse name: Onalee Hua   Number of children: 3   Years of education: Not on file   Highest education level: Some college, no degree  Occupational History   Occupation: WOMENS    Employer: [employer]  Tobacco Use   Smoking status: Never    Passive exposure: Past   Smokeless tobacco: Never  Vaping Use   Vaping status: Never Used  Substance and Sexual Activity   Alcohol use: No   Drug use: No   Sexual activity: Yes    Birth control/protection: Surgical    Comment: BTL  Other Topics Concern   Not on file  Social History Narrative   Regular exercise-yes      Patient is right-handed.She lives with her husband in a 1 story home. She has recently been avoiding caffeine. Walks.   Right handed   Does work   Chemical engineer Strain: Patient Declined (12/15/2022)   Overall Financial Resource Strain (CARDIA)    Difficulty of Paying Living Expenses: Patient declined  Food Insecurity: No Food Insecurity (12/15/2022)   Hunger Vital Sign    Worried About Running Out of Food in the Last Year: Never true    Ran Out of Food in the Last Year: Never true  Transportation Needs: No Transportation Needs (12/15/2022)   PRAPARE - Scientist, research (physical sciences) (Medical): No    Lack of Transportation (Non-Medical): No  Physical Activity: Sufficiently Active (12/15/2022)   Exercise Vital Sign    Days of Exercise per Week: 2 days    Minutes of Exercise per Session: 90 min  Stress: No Stress Concern Present (12/15/2022)   [employer] of Occupational Health - Occupational Stress Questionnaire    Feeling of Stress : Only a little  Social Connections: Socially Integrated (12/15/2022)   Social Connection and Isolation Panel [NHANES]    Frequency of Communication with Friends and Family: More than three  times a week    Frequency of Social Gatherings with Friends and Family: Once a week    Attends Religious Services: More than 4 times per year    Active Member of [employer] or Organizations: Yes    Attends Engineer, structural: More than 4 times per year    Marital Status: Married  Catering manager Violence: Not on file    Family History  Problem Relation Age of Onset   Heart disease Mother    Thyroid disease Mother    Heart attack Mother    Cancer Father    Heart disease Father    Heart attack Father    Throat cancer Father    Lung cancer Father    Alcoholism Father    Graves' disease Sister    Asthma Sister    Hepatitis Sister    Stomach cancer Sister    CVA Brother    Alzheimer's disease Brother    Goiter Brother    Hypertension Brother    Hypertension Brother    Alcoholism Brother    Hyperlipidemia Maternal Aunt    Hypertension Paternal Grandfather    Healthy Daughter    GER disease Son    GER disease Son    Goiter Other        Siblings 2   Depression Neg Hx    Alcohol abuse Neg Hx    Drug abuse Neg Hx    Early death Neg Hx    Hearing loss Neg Hx    Kidney disease Neg Hx    Stroke Neg Hx      Review of Systems  Constitutional: Negative.  Negative for chills and fever.  HENT: Negative.  Negative for congestion and sore throat.   Respiratory: Negative.  Negative for cough and shortness of breath.    Cardiovascular: Negative.  Negative for chest pain and palpitations.  Gastrointestinal:  Negative for abdominal pain, diarrhea, nausea and vomiting.  Genitourinary: Negative.  Negative for dysuria.  Musculoskeletal: Negative.   Skin: Negative.   Neurological:  Positive for headaches.  All other systems reviewed and are negative.   Vitals:   05/14/23 1358  BP: 118/70  Pulse: 68  Resp: 16  Temp: 98.1 F (36.7 C)  SpO2: 100%    Physical Exam Vitals reviewed.  Constitutional:      Appearance: Normal appearance.  HENT:     Head: Normocephalic.     Right Ear: Tympanic membrane, ear canal and external ear normal.     Left Ear: Tympanic membrane, ear canal and external ear normal.     Mouth/Throat:     Mouth: Mucous membranes are moist.     Pharynx: Oropharynx is clear.  Eyes:     Extraocular Movements: Extraocular movements intact.     Conjunctiva/sclera: Conjunctivae normal.     Pupils: Pupils are equal, round, and reactive to light.  Cardiovascular:     Rate and Rhythm: Normal rate and regular rhythm.     Pulses: Normal pulses.     Heart sounds: Normal heart sounds.  Pulmonary:     Effort: Pulmonary effort is normal.     Breath sounds: Normal breath sounds.  Abdominal:     Palpations: Abdomen is soft.     Tenderness: There is no abdominal tenderness.  Musculoskeletal:     Cervical back: No tenderness.     Right lower leg: No edema.     Left lower leg: No edema.  Lymphadenopathy:     Cervical: No cervical adenopathy.  Skin:    General: Skin is warm and dry.     Capillary Refill: Capillary refill takes less than 2 seconds.  Neurological:     General: No focal deficit present.     Mental Status: She is alert and oriented to person, place, and time.  Psychiatric:        Mood and Affect: Mood normal.        Behavior: Behavior normal.      ASSESSMENT & PLAN: Problem List Items Addressed This Visit       Cardiovascular and Mediastinum   Intractable migraine  without aura and without status migrainosus    Clinically stable.  Ubrelvy 100 mg as needed helping      Relevant Medications   ezetimibe (ZETIA) 10 MG tablet     Digestive   GERD (gastroesophageal reflux disease)    Stable.  Takes Protonix 20 mg daily as needed        Endocrine   Hypothyroidism    Clinically euthyroid TSH done today Continue Synthroid 75 mcg daily Will adjust dose accordingly depending on level      Relevant Medications   levothyroxine (SYNTHROID) 75 MCG tablet   Other Relevant Orders   TSH     Other   Dyslipidemia    Chronic stable condition. Intolerant to statins Continue Zetia 10 mg daily Lipid profile done today      Relevant Medications   ezetimibe (ZETIA) 10 MG tablet   Other Relevant Orders   Lipid panel   Prediabetes    Diet and nutrition discussed Hemoglobin A1c done today      Relevant Orders   Hemoglobin A1c   Statin intolerance   Other Visit Diagnoses     Encounter for general adult medical examination with abnormal findings    -  Primary   Relevant Orders   CBC with Differential   Comprehensive metabolic panel   Hemoglobin A1c   Lipid panel   TSH   Mammogram Digital Screening   Visit for screening mammogram       Relevant Orders   Mammogram Digital Screening   Screening for deficiency anemia       Relevant Orders   CBC with Differential   Screening for endocrine, metabolic and immunity disorder       Relevant Orders   Comprehensive metabolic panel   Hemoglobin A1c   Hypercholesteremia       Relevant Medications   ezetimibe (ZETIA) 10 MG tablet      Modifiable risk factors discussed with patient. Anticipatory guidance according to age provided. The following topics were also discussed: Social Determinants of Health Smoking.  Non-smoker Diet and nutrition Benefits of exercise Cancer screening and need for breast cancer screening with mammogram Vaccinations review and recommendations Cardiovascular risk  assessment The 10-year ASCVD risk score (Arnett DK, et al., 2019) is: 2.9%   Values used to calculate the score:     Age: 64 years     Sex: Female     Is Non-Hispanic African American: Yes     Diabetic: No     Tobacco smoker: No     Systolic Blood Pressure: 118 mmHg     Is BP treated: No     HDL Cholesterol: 82.6 mg/dL     Total Cholesterol: 275 mg/dL Review of chronic medical conditions under management Review of all medications Mental health including depression and anxiety Fall and accident prevention   Edwina Barth, MD [facility]

## 2023-05-15 LAB — TSH: TSH: 1.52 u[IU]/mL (ref 0.35–5.50)

## 2023-06-07 ENCOUNTER — Other Ambulatory Visit (HOSPITAL_COMMUNITY): Payer: Self-pay

## 2023-06-24 ENCOUNTER — Encounter: Payer: Self-pay | Admitting: Emergency Medicine

## 2023-06-26 ENCOUNTER — Ambulatory Visit: Payer: Commercial Managed Care - PPO | Admitting: Emergency Medicine

## 2023-06-26 ENCOUNTER — Other Ambulatory Visit (HOSPITAL_COMMUNITY): Payer: Self-pay

## 2023-07-02 ENCOUNTER — Other Ambulatory Visit (HOSPITAL_COMMUNITY): Payer: Self-pay

## 2023-07-25 ENCOUNTER — Ambulatory Visit: Payer: Commercial Managed Care - PPO | Admitting: Emergency Medicine

## 2023-07-25 ENCOUNTER — Encounter: Payer: Self-pay | Admitting: Emergency Medicine

## 2023-07-25 VITALS — BP 130/86 | HR 90 | Temp 98.2°F | Ht 60.0 in | Wt 138.4 lb

## 2023-07-25 DIAGNOSIS — M5412 Radiculopathy, cervical region: Secondary | ICD-10-CM

## 2023-07-25 DIAGNOSIS — I7300 Raynaud's syndrome without gangrene: Secondary | ICD-10-CM

## 2023-07-25 NOTE — Assessment & Plan Note (Signed)
Clinically stable but perhaps contributing to symptoms.

## 2023-07-25 NOTE — Patient Instructions (Signed)
Cervical Radiculopathy  Cervical radiculopathy means that a nerve in the neck (a cervical nerve) is pinched or bruised. This can happen because of an injury to the cervical spine (vertebrae) in the neck, or as a normal part of getting older. This condition can cause pain or loss of feeling (numbness) that runs from your neck all the way down to your arm and fingers. Often, this condition gets better with rest. Treatment may be needed if the condition does not get better. What are the causes? A neck injury. A bulging disk in your spine. Sudden muscle tightening (muscle spasms). Tight muscles in your neck due to overuse. Arthritis. Breakdown in the bones and joints of the spine (spondylosis) due to getting older. Bone spurs that form near the nerves in the neck. What are the signs or symptoms? Pain. The pain may: Run from the neck to the arm and hand. Be very bad or irritating. Get worse when you move your neck. Loss of feeling or tingling in your arm or hand. Weakness in your arm or hand, in very bad cases. How is this treated? In many cases, treatment is not needed for this condition. With rest, the condition often gets better over time. If treatment is needed, options may include: Wearing a soft neck collar (cervical collar) for short periods of time. Doing exercises (physical therapy) to strengthen your neck muscles. Taking medicines. Having shots (injections) in your spine, in very bad cases. Having surgery. This may be needed if other treatments do not help. The type of surgery that is used will depend on the cause of your condition. Follow these instructions at home: If you have a soft neck collar: Wear it as told by your doctor. Take it off only as told by your doctor. Ask your doctor if you can take the collar off for cleaning and bathing. If you are allowed to take the collar off for cleaning or bathing: Follow instructions from your doctor about how to take off the collar  safely. Clean the collar by wiping it with mild soap and water and drying it completely. Take out any removable pads in the collar every 1-2 days. Wash them by hand with soap and water. Let them air-dry completely before you put them back in the collar. Check your skin under the collar for redness or sores. If you see any, tell your doctor. Managing pain     Take over-the-counter and prescription medicines only as told by your doctor. If told, put ice on the painful area. To do this: If you have a soft neck collar, take if off as told by your doctor. Put ice in a plastic bag. Place a towel between your skin and the bag. Leave the ice on for 20 minutes, 2-3 times a day. Take off the ice if your skin turns bright red. This is very important. If you cannot feel pain, heat, or cold, you have a greater risk of damage to the area. If using ice does not help, you can try using heat. Use the heat source that your doctor recommends, such as a moist heat pack or a heating pad. Place a towel between your skin and the heat source. Leave the heat on for 20-30 minutes. Take off the heat if your skin turns bright red. This is very important. If you cannot feel pain, heat, or cold, you have a greater risk of getting burned. You may try a gentle neck and shoulder rub (massage). Activity Rest as needed. Return  to your normal activities when your doctor says that it is safe. Do exercises as told by your doctor or physical therapist. You may have to avoid lifting. Ask your doctor how much you can safely lift. General instructions Use a flat pillow when you sleep. Do not drive while wearing a soft neck collar. If you do not have a soft neck collar, ask your doctor if it is safe to drive while your neck heals. Ask your doctor if you should avoid driving or using machines while you are taking your medicine. Do not smoke or use any products that contain nicotine or tobacco. If you need help quitting, ask your  doctor. Keep all follow-up visits. Contact a doctor if: Your condition does not get better with treatment. Get help right away if: Your pain gets worse and medicine does not help. You lose feeling or feel weak in your hand, arm, face, or leg. You have a high fever. Your neck is stiff. You cannot control when you poop or pee (have incontinence). You have trouble with walking, balance, or talking. Summary Cervical radiculopathy means that a nerve in the neck is pinched or bruised. A nerve can get pinched from a bulging disk, arthritis, an injury to the neck, or other causes. Symptoms include pain, tingling, or loss of feeling that goes from the neck to the arm or hand. Weakness in your arm or hand can happen in very bad cases. Treatment may include resting, wearing a soft neck collar, and doing exercises. You might need to take medicines for pain. In very bad cases, shots or surgery may be needed. This information is not intended to replace advice given to you by your health care provider. Make sure you discuss any questions you have with your health care provider. Document Revised: 03/02/2021 Document Reviewed: 03/02/2021 Elsevier Patient Education  2024 [employer].

## 2023-07-25 NOTE — Assessment & Plan Note (Signed)
Active and affecting quality of life. Very symptomatic. Recommend cervical spine MRI and spinal surgery evaluation Referral placed today. Pain management discussed.

## 2023-07-25 NOTE — Progress Notes (Signed)
Chelsey Hunt 57 y.o.   Chief Complaint  Patient presents with   Tingling    Tingling and numbness in her arms and fingers, right arm is painful... dizziness.     HISTORY OF PRESENT ILLNESS: This is a 57 y.o. female complaining of bilateral tingling and numbness to her arms, right more than left, on and off for the past couple years. Denies injury.  History of migraine headaches. No other complaints or medical concerns today.  HPI   Prior to Admission medications   Medication Sig Start Date End Date Taking? Authorizing Provider  acetaminophen (TYLENOL) 325 MG tablet Take 650 mg by mouth every 6 (six) hours as needed for mild pain.   Yes [provider]  Cholecalciferol (D3 2000 PO) Take by mouth.   Yes [provider]  co-enzyme Q-10 30 MG capsule Take 200 mg by mouth 3 (three) times daily.   Yes [provider]  cyclobenzaprine (FLEXERIL) 5 MG tablet Take 1 tablet (5 mg total) by mouth 3 (three) times daily as needed for muscle spasms. 01/23/22  Yes Jaffe, Adam R, DO  dicyclomine (BENTYL) 10 MG capsule Take 1 capsule (10 mg total) by mouth 3 (three) times daily as needed. 08/24/21  Yes   dicyclomine (BENTYL) 10 MG capsule Take 1 capsule (10 mg total) by mouth 3 times daily. as needed 11/07/22  Yes   ezetimibe (ZETIA) 10 MG tablet Take 1 tablet (10 mg total) by mouth every evening. 05/14/23  Yes Narvel Kozub, Eilleen Kempf, MD  ibuprofen (ADVIL,MOTRIN) 200 MG tablet Take 200 mg by mouth every 8 (eight) hours as needed.   Yes [provider]  levothyroxine (SYNTHROID) 75 MCG tablet Take 1 tablet (75 mcg total) by mouth daily. 05/14/23  Yes Keanon Bevins, Eilleen Kempf, MD  magnesium 30 MG tablet Take 30 mg by mouth daily.   Yes [provider]  pantoprazole (PROTONIX) 20 MG tablet Take 1 tablet (20 mg total) by mouth 2 (two) times daily before a meal. Take 30-45 minutes prior to breakfast and dinner. 04/08/23  Yes   promethazine (PHENERGAN) 25 MG tablet  Take 1 tablet (25 mg total) by mouth every 6 (six) hours as needed for nausea or vomiting. 10/29/22  Yes Jaffe, Adam R, DO  Ubrogepant (UBRELVY) 100 MG TABS Take 1 tablet (100 mg) by mouth as needed. May repeat dose in 2 hours.  Maximum 2 tablets in 24 hours. 10/29/22  Yes Everlena Cooper, Adam R, DO  valACYclovir (VALTREX) 1000 MG tablet Take 1 tablet (1,000 mg total) by mouth every 12 (twelve) hours for 5 days 09/25/21  Yes   valACYclovir (VALTREX) 1000 MG tablet Take 1 tablet (1,000 mg total) by mouth every 12 (twelve) hours for 5 days 09/25/22  Yes     Allergies  Allergen Reactions   Imitrex [Sumatriptan] Palpitations   Reglan [Metoclopramide] Palpitations   Rosuvastatin Other (See Comments)    Arthralgia and myalgia   Propoxyphene N-Acetaminophen    Nabumetone Rash    REACTION: GI upset   Topiramate Rash    REACTION: rash    Patient Active Problem List   Diagnosis Date Noted   Musculoskeletal neck pain 06/12/2022   Cervical radiculopathy 04/10/2022   Statin intolerance 03/08/2022   Family history of heart disease 11/15/2021   Obstructive sleep apnea 09/07/2021   Prediabetes 09/07/2021   Dyslipidemia 08/14/2021   Positive ANA (antinuclear antibody) 04/20/2021   Finger deformity 04/20/2021   Raynaud phenomenon 09/17/2019   Anxiety 07/30/2019   Chronic idiopathic  constipation 01/08/2019   Psychophysiological insomnia 07/07/2018   Seasonal allergic rhinitis due to pollen 08/08/2017   Intractable migraine without aura and without status migrainosus 07/02/2016   Pancreas divisum of native pancreas 12/27/2014   Uterine leiomyoma 09/26/2012   GERD (gastroesophageal reflux disease)    Hypothyroidism 08/12/2009    Past Medical History:  Diagnosis Date   ACID REFLUX DISEASE 01/03/2008   Anemia    ANEMIA, IRON DEFICIENCY 12/23/2008   history   Anxiety    Chronic tension type headache 01/13/2009   migraines   Complication of anesthesia    pt states she just needs a small amount or she will  sleep too long   Dysplasia of cervix, low grade (CIN 1)    Fibroid    GERD (gastroesophageal reflux disease)    HEART MURMUR, SYSTOLIC 07/07/2010   will have echo 06/18/2013   HYPOTHYROIDISM, POST-RADIATION 08/12/2009   Irritable bowel syndrome 12/23/2008   Migraine    Ovarian cyst    Ovarian cyst    THYROID NODULE, RIGHT 04/21/2009    Past Surgical History:  Procedure Laterality Date   ABDOMINAL HYSTERECTOMY     COLONOSCOPY     HYSTEROSCOPY     LAPAROSCOPY     NOVASURE ABLATION     POLYPECTOMY     TUBAL LIGATION     UPPER GI ENDOSCOPY     VAGINAL HYSTERECTOMY N/A 06/24/2013   Procedure: TOTAL HYSTERECTOMY VAGINAL;  Surgeon: Esmeralda Arthur, MD;  Location: [facility];  Service: Gynecology;  Laterality: N/A;    Social History   Socioeconomic History   Marital status: Married    Spouse name: Onalee Hua   Number of children: 3   Years of education: Not on file   Highest education level: Some college, no degree  Occupational History   Occupation: WOMENS    Employer: [employer]  Tobacco Use   Smoking status: Never    Passive exposure: Past   Smokeless tobacco: Never  Vaping Use   Vaping status: Never Used  Substance and Sexual Activity   Alcohol use: No   Drug use: No   Sexual activity: Yes    Birth control/protection: Surgical    Comment: BTL  Other Topics Concern   Not on file  Social History Narrative   Regular exercise-yes      Patient is right-handed.She lives with her husband in a 1 story home. She has recently been avoiding caffeine. Walks.   Right handed   Does work   Chemical engineer Strain: Patient Declined (12/15/2022)   Overall Financial Resource Strain (CARDIA)    Difficulty of Paying Living Expenses: Patient declined  Food Insecurity: No Food Insecurity (12/15/2022)   Hunger Vital Sign    Worried About Running Out of Food in the Last Year: Never true    Ran Out of Food in the Last Year: Never true  Transportation Needs:  No Transportation Needs (12/15/2022)   PRAPARE - Administrator, Civil Service (Medical): No    Lack of Transportation (Non-Medical): No  Physical Activity: Sufficiently Active (12/15/2022)   Exercise Vital Sign    Days of Exercise per Week: 2 days    Minutes of Exercise per Session: 90 min  Stress: No Stress Concern Present (12/15/2022)   [employer] of Occupational Health - Occupational Stress Questionnaire    Feeling of Stress : Only a little  Social Connections: Socially Integrated (12/15/2022)   Social Connection and Isolation Panel [NHANES]  Frequency of Communication with Friends and Family: More than three times a week    Frequency of Social Gatherings with Friends and Family: Once a week    Attends Religious Services: More than 4 times per year    Active Member of [employer] or Organizations: Yes    Attends Engineer, structural: More than 4 times per year    Marital Status: Married  Catering manager Violence: Not on file    Family History  Problem Relation Age of Onset   Heart disease Mother    Thyroid disease Mother    Heart attack Mother    Cancer Father    Heart disease Father    Heart attack Father    Throat cancer Father    Lung cancer Father    Alcoholism Father    Graves' disease Sister    Asthma Sister    Hepatitis Sister    Stomach cancer Sister    CVA Brother    Alzheimer's disease Brother    Goiter Brother    Hypertension Brother    Hypertension Brother    Alcoholism Brother    Hyperlipidemia Maternal Aunt    Hypertension Paternal Grandfather    Healthy Daughter    GER disease Son    GER disease Son    Goiter Other        Siblings 2   Depression Neg Hx    Alcohol abuse Neg Hx    Drug abuse Neg Hx    Early death Neg Hx    Hearing loss Neg Hx    Kidney disease Neg Hx    Stroke Neg Hx      Review of Systems  Constitutional: Negative.  Negative for chills and fever.  HENT: Negative.  Negative for congestion and sore throat.    Respiratory: Negative.  Negative for cough and shortness of breath.   Cardiovascular: Negative.  Negative for chest pain and palpitations.  Gastrointestinal:  Negative for abdominal pain, nausea and vomiting.  Musculoskeletal:  Positive for back pain and neck pain.  Skin: Negative.  Negative for rash.  Neurological:  Positive for sensory change and headaches. Negative for dizziness, focal weakness and weakness.  All other systems reviewed and are negative.   Vitals:   07/25/23 0911  BP: 130/86  Pulse: 90  Temp: 98.2 F (36.8 C)  SpO2: 95%    Physical Exam Vitals reviewed.  Constitutional:      Appearance: Normal appearance.  HENT:     Head: Normocephalic.  Eyes:     Extraocular Movements: Extraocular movements intact.     Pupils: Pupils are equal, round, and reactive to light.  Cardiovascular:     Rate and Rhythm: Normal rate and regular rhythm.     Pulses: Normal pulses.     Heart sounds: Normal heart sounds.  Pulmonary:     Effort: Pulmonary effort is normal.     Breath sounds: Normal breath sounds.  Musculoskeletal:     Cervical back: No tenderness.  Lymphadenopathy:     Cervical: No cervical adenopathy.  Skin:    General: Skin is warm and dry.  Neurological:     Mental Status: She is alert and oriented to person, place, and time.     Cranial Nerves: No cranial nerve deficit.     Sensory: No sensory deficit.     Motor: No weakness.     Coordination: Coordination normal.     Gait: Gait normal.  Psychiatric:  Mood and Affect: Mood normal.        Behavior: Behavior normal.      ASSESSMENT & PLAN: A total of 42 minutes was spent with the patient and counseling/coordination of care regarding preparing for this visit, review of most recent office visit notes, review of most recent blood work results, review of chronic medical conditions under management, review of available spine MRI reports, differential diagnosis of cervical radiculopathy and need for  spinal surgery evaluation, need for cervical spine MRI, symptom management, prognosis, documentation and need for follow-up.  Problem List Items Addressed This Visit       Nervous and Auditory   Cervical radiculopathy - Primary    Active and affecting quality of life. Very symptomatic. Recommend cervical spine MRI and spinal surgery evaluation Referral placed today. Pain management discussed.      Relevant Orders   MR Cervical Spine Wo Contrast   Ambulatory referral to Spine Surgery     Other   Raynaud phenomenon    Clinically stable but perhaps contributing to symptoms.      Patient Instructions  Cervical Radiculopathy  Cervical radiculopathy means that a nerve in the neck (a cervical nerve) is pinched or bruised. This can happen because of an injury to the cervical spine (vertebrae) in the neck, or as a normal part of getting older. This condition can cause pain or loss of feeling (numbness) that runs from your neck all the way down to your arm and fingers. Often, this condition gets better with rest. Treatment may be needed if the condition does not get better. What are the causes? A neck injury. A bulging disk in your spine. Sudden muscle tightening (muscle spasms). Tight muscles in your neck due to overuse. Arthritis. Breakdown in the bones and joints of the spine (spondylosis) due to getting older. Bone spurs that form near the nerves in the neck. What are the signs or symptoms? Pain. The pain may: Run from the neck to the arm and hand. Be very bad or irritating. Get worse when you move your neck. Loss of feeling or tingling in your arm or hand. Weakness in your arm or hand, in very bad cases. How is this treated? In many cases, treatment is not needed for this condition. With rest, the condition often gets better over time. If treatment is needed, options may include: Wearing a soft neck collar (cervical collar) for short periods of time. Doing exercises (physical  therapy) to strengthen your neck muscles. Taking medicines. Having shots (injections) in your spine, in very bad cases. Having surgery. This may be needed if other treatments do not help. The type of surgery that is used will depend on the cause of your condition. Follow these instructions at home: If you have a soft neck collar: Wear it as told by your doctor. Take it off only as told by your doctor. Ask your doctor if you can take the collar off for cleaning and bathing. If you are allowed to take the collar off for cleaning or bathing: Follow instructions from your doctor about how to take off the collar safely. Clean the collar by wiping it with mild soap and water and drying it completely. Take out any removable pads in the collar every 1-2 days. Wash them by hand with soap and water. Let them air-dry completely before you put them back in the collar. Check your skin under the collar for redness or sores. If you see any, tell your doctor. Managing pain  Take over-the-counter and prescription medicines only as told by your doctor. If told, put ice on the painful area. To do this: If you have a soft neck collar, take if off as told by your doctor. Put ice in a plastic bag. Place a towel between your skin and the bag. Leave the ice on for 20 minutes, 2-3 times a day. Take off the ice if your skin turns bright red. This is very important. If you cannot feel pain, heat, or cold, you have a greater risk of damage to the area. If using ice does not help, you can try using heat. Use the heat source that your doctor recommends, such as a moist heat pack or a heating pad. Place a towel between your skin and the heat source. Leave the heat on for 20-30 minutes. Take off the heat if your skin turns bright red. This is very important. If you cannot feel pain, heat, or cold, you have a greater risk of getting burned. You may try a gentle neck and shoulder rub (massage). Activity Rest as  needed. Return to your normal activities when your doctor says that it is safe. Do exercises as told by your doctor or physical therapist. You may have to avoid lifting. Ask your doctor how much you can safely lift. General instructions Use a flat pillow when you sleep. Do not drive while wearing a soft neck collar. If you do not have a soft neck collar, ask your doctor if it is safe to drive while your neck heals. Ask your doctor if you should avoid driving or using machines while you are taking your medicine. Do not smoke or use any products that contain nicotine or tobacco. If you need help quitting, ask your doctor. Keep all follow-up visits. Contact a doctor if: Your condition does not get better with treatment. Get help right away if: Your pain gets worse and medicine does not help. You lose feeling or feel weak in your hand, arm, face, or leg. You have a high fever. Your neck is stiff. You cannot control when you poop or pee (have incontinence). You have trouble with walking, balance, or talking. Summary Cervical radiculopathy means that a nerve in the neck is pinched or bruised. A nerve can get pinched from a bulging disk, arthritis, an injury to the neck, or other causes. Symptoms include pain, tingling, or loss of feeling that goes from the neck to the arm or hand. Weakness in your arm or hand can happen in very bad cases. Treatment may include resting, wearing a soft neck collar, and doing exercises. You might need to take medicines for pain. In very bad cases, shots or surgery may be needed. This information is not intended to replace advice given to you by your health care provider. Make sure you discuss any questions you have with your health care provider. Document Revised: 03/02/2021 Document Reviewed: 03/02/2021 Elsevier Patient Education  2024 Elsevier Inc.      Edwina Barth, MD [facility]

## 2023-07-30 ENCOUNTER — Encounter: Payer: Self-pay | Admitting: Emergency Medicine

## 2023-08-03 ENCOUNTER — Ambulatory Visit
Admission: RE | Admit: 2023-08-03 | Discharge: 2023-08-03 | Disposition: A | Discharge status: Home / Self Care | Payer: Commercial Managed Care - PPO | Source: Ambulatory Visit | Attending: Emergency Medicine | Admitting: Emergency Medicine

## 2023-08-03 DIAGNOSIS — M47812 Spondylosis without myelopathy or radiculopathy, cervical region: Secondary | ICD-10-CM | POA: Diagnosis not present

## 2023-08-03 DIAGNOSIS — M5412 Radiculopathy, cervical region: Secondary | ICD-10-CM

## 2023-08-07 ENCOUNTER — Other Ambulatory Visit: Payer: Self-pay | Admitting: Emergency Medicine

## 2023-08-07 ENCOUNTER — Encounter: Payer: Self-pay | Admitting: Emergency Medicine

## 2023-08-07 ENCOUNTER — Other Ambulatory Visit (HOSPITAL_COMMUNITY): Payer: Self-pay

## 2023-08-07 MED ORDER — TRAMADOL HCL 50 MG PO TABS
50.0000 mg | ORAL_TABLET | Freq: Three times a day (TID) | ORAL | 0 refills | Status: AC | PRN
  Filled 2023-08-07: qty 15, 5d supply, fill #0

## 2023-08-07 NOTE — Telephone Encounter (Signed)
Prescription for tramadol sent to pharmacy of record today.  Thanks.

## 2023-08-14 DIAGNOSIS — G5603 Carpal tunnel syndrome, bilateral upper limbs: Secondary | ICD-10-CM | POA: Diagnosis not present

## 2023-08-14 DIAGNOSIS — Z6826 Body mass index (BMI) 26.0-26.9, adult: Secondary | ICD-10-CM | POA: Diagnosis not present

## 2023-09-05 ENCOUNTER — Telehealth: Payer: Self-pay

## 2023-09-05 ENCOUNTER — Other Ambulatory Visit (HOSPITAL_COMMUNITY): Payer: Self-pay

## 2023-09-05 ENCOUNTER — Telehealth: Payer: Self-pay | Admitting: Pharmacy Technician

## 2023-09-05 NOTE — Telephone Encounter (Signed)
 PA needed for [employer]

## 2023-09-05 NOTE — Telephone Encounter (Signed)
PA has been submitted, and telephone encounter has been created. 

## 2023-09-05 NOTE — Telephone Encounter (Signed)
Pharmacy Patient Advocate Encounter   Received notification from Pt Calls Messages that prior authorization for UBRELVY 100MG  is required/requested.   Insurance verification completed.   The patient is insured through [facility] .   Per test claim: PA required; PA submitted to above mentioned insurance via CoverMyMeds Key/confirmation #/EOC [id number] Status is pending

## 2023-09-06 ENCOUNTER — Other Ambulatory Visit (HOSPITAL_COMMUNITY): Payer: Self-pay

## 2023-09-06 NOTE — Telephone Encounter (Signed)
Pharmacy Patient Advocate Encounter  Received notification from [facility] that Prior Authorization for Ubrelvy 100MG  tablets has been APPROVED from 09/05/2023 to 03/03/2024. Ran test claim, Copay is $0.00. This test claim was processed through [facility]- copay amounts may vary at other pharmacies due to pharmacy/plan contracts, or as the patient moves through the different stages of their insurance plan.   PA #/Case ID/Reference #: [id number]

## 2023-09-16 ENCOUNTER — Other Ambulatory Visit: Payer: Self-pay | Admitting: Obstetrics and Gynecology

## 2023-09-16 DIAGNOSIS — Z1231 Encounter for screening mammogram for malignant neoplasm of breast: Secondary | ICD-10-CM

## 2023-09-27 ENCOUNTER — Ambulatory Visit
Admission: RE | Admit: 2023-09-27 | Discharge: 2023-09-27 | Disposition: A | Discharge status: Home / Self Care | Payer: Commercial Managed Care - PPO | Source: Ambulatory Visit | Attending: Obstetrics and Gynecology | Admitting: Obstetrics and Gynecology

## 2023-09-27 DIAGNOSIS — Z1231 Encounter for screening mammogram for malignant neoplasm of breast: Secondary | ICD-10-CM

## 2023-10-01 ENCOUNTER — Other Ambulatory Visit (HOSPITAL_COMMUNITY): Payer: Self-pay

## 2023-10-01 DIAGNOSIS — Z01419 Encounter for gynecological examination (general) (routine) without abnormal findings: Secondary | ICD-10-CM | POA: Diagnosis not present

## 2023-10-01 DIAGNOSIS — Z1231 Encounter for screening mammogram for malignant neoplasm of breast: Secondary | ICD-10-CM | POA: Diagnosis not present

## 2023-10-01 DIAGNOSIS — A609 Anogenital herpesviral infection, unspecified: Secondary | ICD-10-CM | POA: Diagnosis not present

## 2023-10-01 DIAGNOSIS — Z1330 Encounter for screening examination for mental health and behavioral disorders, unspecified: Secondary | ICD-10-CM | POA: Diagnosis not present

## 2023-10-01 DIAGNOSIS — N951 Menopausal and female climacteric states: Secondary | ICD-10-CM | POA: Diagnosis not present

## 2023-10-01 DIAGNOSIS — Z1211 Encounter for screening for malignant neoplasm of colon: Secondary | ICD-10-CM | POA: Diagnosis not present

## 2023-10-01 MED ORDER — VALACYCLOVIR HCL 1 G PO TABS
1000.0000 mg | ORAL_TABLET | Freq: Two times a day (BID) | ORAL | 11 refills | Status: AC
  Filled 2023-10-01: qty 30, 15d supply, fill #0
  Filled 2023-12-21: qty 30, 15d supply, fill #1

## 2023-10-03 ENCOUNTER — Other Ambulatory Visit (HOSPITAL_COMMUNITY): Payer: Self-pay

## 2023-10-08 NOTE — Progress Notes (Unsigned)
NEUROLOGY FOLLOW UP OFFICE NOTE  Clora Ohmer [id number]  Assessment/Plan:   Migraine without aura, without status migrainosus, not intractable  Cervical spondylosis    1 She is hesitant about starting a new preventative medication.  I directed her to consider Cefaly.  I also suggested Vyepti or Qulipta for alternative preventative medications.  She will look into these recommendations. *** 2  Ubrelvy as needed  *** 3  Cyclobenzaprine for neck pain  *** 4  Advised to contact Dr. Teofilo Pod office at the sleep clinic at [facility] to follow up on treatment for her OSA. *** 5  Follow up in 6 months.   Subjective:  Zaiyah Sottile is a 58 year old right-handed woman with hypothyroidism who follows up for migraines.   UPDATE: Fluctuates based on weather.  Currently, she has 7 migraines a month (2 were severe)  Severe migraines last 2-3 hours, mild-moderate last 1-2 hours with Bernita Raisin. ***  Current NSAIDS: ASA 81mg  Current analgesics: none Current triptans: None Current ergotamine: None Current abortive anti-CGRP:  none Current anti-emetic: Promethazine Current muscle relaxants: Cyclobenzaprine 10 mg (neck pain) Current anti-anxiolytic: None Current sleep aide: None Current Antihypertensive medications: None Current Antidepressant medications: none Current Anticonvulsant medications: gabapentin 100mg  QHS (for scalp neuralgia; cannot tolerate higher doses) Current anti-CGRP: Ubrelvy 100mg  Current Vitamins/Herbal/Supplements: magnesium Current Antihistamines/Decongestants: None Other therapy: Physical therapy for neck pain; dry needling, chiropractor Hormone/birth control: None  For ongoing bilateral upper extremity tingling, she had MRI of cervical spine on 08/03/2023 personally reviewed which redemonstrated cervical spondylosis and degenerative disc disease causing mild right foraminal stenosis at C4-5.  She was referred to neurosurgery ***   Caffeine: No Diet: increased water  intake.  1/2 gallon water daily.   Exercise: She is now exercising - treadmill and bike 2 days a week.  Trying to increase frequency  Depression: No; Anxiety: Yes Other pain:  neck pain.  She has cervical spondylosis Sleep hygiene: She has OSA.  CPAP ***   HISTORY: Onset: First migraine was between 36 and 71 years old.  Migraine free until 2004 during her menstrual cycle.  Chronic for many years. Location:  Left more than right. Quality:  throbbing Initial intensity:  Severe.   Aura:  Initially no.  Later developed visual aura of "strands of hair floating" in her vision. Prodrome:  no Postdrome:  fatigue Associated symptoms:  Right facial tingling, nausea, photophobia, phonophobia, osmophobia.  She denies associated vomiting, visual disturbance or unilateral numbness or weakness. Initial duration:  3 days Initial Frequency:  daily Initial Frequency of abortive medication: Advil or Tylenol daily, Maxalt 2 days a week Triggers:  Certain smells, change in barometric pressure, sausages, certain cheese, bananas; brazilian nutes, COVID-19 (contracted in September 2020) Relieving factors: Exercise, staying hydrated Activity:  aggravates   During summer 2019, she has had throbbing occipital headache, right-sided neck pain radiating up to the occiput, a burning scalp neuralgia, associated with tinnitus, sometimes left or right sided facial numbness, facial twitching.  She started having a panic attack with palpitations.  She went to the ED on 06/26/18 for symptoms.  EKG showed sinus tachycardia.  She was treated with headache cocktail.  To evaluate worsening headaches, she had an MRI of the brain on 11/21/2018 which demonstrated nonspecific white matter hyperintensities in the cerebral white matter, likely chronic small vessel ischemic changes, but no acute abnormality.  For further evaluation of neck pain, cervical spine X-ray on 04/10/2019 showed slight refersal of the normal cervical lordosis with  minimal retrolisthesis of  C5 on C6 and cervical spondylosis with osteophytes most notable at C4-5 and C5-6.  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   Due to fatigue, her PCP checked for autoimmune condition.  ANA was positive with positive cyclic citrullin peptide antibody.  She has been referred to rheumatology.  She saw sleep medicine.  She was found to have moderate OSA and was ordered auto-PAP.      She has bilateral tinnitus.  Exam by ENT unremarkable.    Past NSAIDS: Ibuprofen, naproxen, Cambia Past analgesics:  Excedrin, Lidocaine nasal Past abortive triptans: Relpax, sumatriptan tablet, Maxalt, Tosymra NS, Zembrace.   Past abortive ergotamine: Migranal Past muscle relaxants:  none Past anti-emetic:  Zofran Past antihypertensive medications: Verapamil, propranolol Past antidepressant medications: Imipramine, nortriptyline Past anticonvulsant medications:  topiramate Past anti-CGRP:  Aimovig (aggravated migraine), Emgality (ineffective), Ajovy, Nurtec Past vitamins/Herbal/Supplements: Riboflavin, CoQ10 Past antihistamines/decongestants:  None Other past therapies:  acupuncture (triggered migraine), Reyvow   Family history of headache:  Mom (migraines), daughter (migraines)   MRI and MRA of the head from 03/09/2009 revealed nonspecific small subcortical white matter hyperintensities and 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  MRI of the brain on 11/21/2018 which demonstrated nonspecific white matter hyperintensities in the cerebral white matter.  To evaluate new visual aura with migraine (hair floating in her vision), MRI of brain with and without contrast performed on 02/10/2020 again demonstrated non-enhancing white matter T2 and FLAIR signal in the cerebral hemispheres, stable compared to prior imaging from 2020.  To further evaluate abnormal white  matter on brain MRI, she had MRI of cervical and thoracic spine without contrast on 03/26/2020 which showed no cord lesions but did show incidental 1 cm T1 and T2 dark ovoid lesion in the T6 vertebral body.  Follow up MRI thoracic spine with contrast on 05/25/2020 showed no change and no enhancement, thought to represent benign marrow focus .  Vasculitis workup, including SSA/SSB antibodies and pan-ANCA panel, was negative.  Sed rate was 14 and CRP negative.She was evaluated by ophthalmology in September 2021 which demonstrated dry eye but no concerning findings such as optic neuritis.  PAST MEDICAL HISTORY: Past Medical History:  Diagnosis Date   ACID REFLUX DISEASE 01/03/2008   Anemia    ANEMIA, IRON DEFICIENCY 12/23/2008   history   Anxiety    Chronic tension type headache 01/13/2009   migraines   Complication of anesthesia    pt states she just needs a small amount or she will sleep too long   Dysplasia of cervix, low grade (CIN 1)    Fibroid    GERD (gastroesophageal reflux disease)    HEART MURMUR, SYSTOLIC 07/07/2010   will have echo 06/18/2013   HYPOTHYROIDISM, POST-RADIATION 08/12/2009   Irritable bowel syndrome 12/23/2008   Migraine    Ovarian cyst    Ovarian cyst    THYROID NODULE, RIGHT 04/21/2009    MEDICATIONS: Current Outpatient Medications on File Prior to Visit  Medication Sig Dispense Refill   acetaminophen (TYLENOL) 325 MG tablet Take 650 mg by mouth every 6 (six) hours as needed for mild pain.     Cholecalciferol (D3 2000 PO) Take by mouth.     co-enzyme Q-10 30 MG capsule Take 200 mg by mouth 3 (three) times daily.     cyclobenzaprine (FLEXERIL) 5 MG tablet Take 1 tablet (5 mg total) by mouth 3 (three) times daily as needed for  muscle spasms. 30 tablet 5   dicyclomine (BENTYL) 10 MG capsule Take 1 capsule (10 mg total) by mouth 3 (three) times daily as needed. 90 capsule 3   dicyclomine (BENTYL) 10 MG capsule Take 1 capsule (10 mg total) by mouth 3 times daily. as  needed 90 capsule 2   ezetimibe (ZETIA) 10 MG tablet Take 1 tablet (10 mg total) by mouth every evening. 90 tablet 3   ibuprofen (ADVIL,MOTRIN) 200 MG tablet Take 200 mg by mouth every 8 (eight) hours as needed.     levothyroxine (SYNTHROID) 75 MCG tablet Take 1 tablet (75 mcg total) by mouth daily. 90 tablet 3   magnesium 30 MG tablet Take 30 mg by mouth daily.     pantoprazole (PROTONIX) 20 MG tablet Take 1 tablet (20 mg total) by mouth 2 (two) times daily before a meal. Take 30-45 minutes prior to breakfast and dinner. 180 tablet 1   promethazine (PHENERGAN) 25 MG tablet Take 1 tablet (25 mg total) by mouth every 6 (six) hours as needed for nausea or vomiting. 30 tablet 5   Ubrogepant (UBRELVY) 100 MG TABS Take 1 tablet (100 mg) by mouth as needed. May repeat dose in 2 hours.  Maximum 2 tablets in 24 hours. 16 tablet 5   valACYclovir (VALTREX) 1000 MG tablet Take 1 tablet (1,000 mg total) by mouth every 12 (twelve) hours for 5 days 30 tablet 11   valACYclovir (VALTREX) 1000 MG tablet Take 1 tablet (1,000 mg total) by mouth every 12 (twelve) hours for 5 days 30 tablet 11   valACYclovir (VALTREX) 1000 MG tablet Take 1 tablet (1,000 mg total) by mouth every 12 (twelve) hours for 5 days. 30 tablet 11   No current facility-administered medications on file prior to visit.    ALLERGIES: Allergies  Allergen Reactions   Imitrex [Sumatriptan] Palpitations   Reglan [Metoclopramide] Palpitations   Rosuvastatin Other (See Comments)    Arthralgia and myalgia   Propoxyphene N-Acetaminophen    Nabumetone Rash    REACTION: GI upset   Topiramate Rash    REACTION: rash    FAMILY HISTORY: Family History  Problem Relation Age of Onset   Heart disease Mother    Thyroid disease Mother    Heart attack Mother    Cancer Father    Heart disease Father    Heart attack Father    Throat cancer Father    Lung cancer Father    Alcoholism Father    Graves' disease Sister    Asthma Sister    Hepatitis  Sister    Stomach cancer Sister    CVA Brother    Alzheimer's disease Brother    Goiter Brother    Hypertension Brother    Hypertension Brother    Alcoholism Brother    Hyperlipidemia Maternal Aunt    Hypertension Paternal Grandfather    Healthy Daughter    GER disease Son    GER disease Son    Goiter Other        Siblings 2   Depression Neg Hx    Alcohol abuse Neg Hx    Drug abuse Neg Hx    Early death Neg Hx    Hearing loss Neg Hx    Kidney disease Neg Hx    Stroke Neg Hx       Objective:  *** General: No acute distress.  Patient appears well-groomed.   ***    Shon Millet, DO  CC: Georgina Quint, MD

## 2023-10-09 ENCOUNTER — Ambulatory Visit (INDEPENDENT_AMBULATORY_CARE_PROVIDER_SITE_OTHER): Payer: Commercial Managed Care - PPO

## 2023-10-09 ENCOUNTER — Ambulatory Visit: Payer: Commercial Managed Care - PPO | Admitting: Neurology

## 2023-10-09 ENCOUNTER — Ambulatory Visit: Payer: Commercial Managed Care - PPO | Admitting: Podiatry

## 2023-10-09 ENCOUNTER — Other Ambulatory Visit (HOSPITAL_COMMUNITY): Payer: Self-pay

## 2023-10-09 ENCOUNTER — Encounter: Payer: Self-pay | Admitting: Neurology

## 2023-10-09 ENCOUNTER — Encounter: Payer: Self-pay | Admitting: Podiatry

## 2023-10-09 VITALS — BP 131/75 | HR 95 | Ht 62.0 in | Wt 130.0 lb

## 2023-10-09 DIAGNOSIS — G4733 Obstructive sleep apnea (adult) (pediatric): Secondary | ICD-10-CM

## 2023-10-09 DIAGNOSIS — R202 Paresthesia of skin: Secondary | ICD-10-CM | POA: Diagnosis not present

## 2023-10-09 DIAGNOSIS — R200 Anesthesia of skin: Secondary | ICD-10-CM | POA: Diagnosis not present

## 2023-10-09 DIAGNOSIS — M7752 Other enthesopathy of left foot: Secondary | ICD-10-CM | POA: Diagnosis not present

## 2023-10-09 DIAGNOSIS — G43009 Migraine without aura, not intractable, without status migrainosus: Secondary | ICD-10-CM

## 2023-10-09 DIAGNOSIS — M21622 Bunionette of left foot: Secondary | ICD-10-CM

## 2023-10-09 MED ORDER — UBRELVY 100 MG PO TABS
100.0000 mg | ORAL_TABLET | ORAL | 5 refills | Status: AC | PRN
  Filled 2023-10-09 – 2024-04-08 (×2): qty 16, 30d supply, fill #0
  Filled 2024-07-03: qty 16, 30d supply, fill #1

## 2023-10-09 MED ORDER — TRIAMCINOLONE ACETONIDE 10 MG/ML IJ SUSP
10.0000 mg | Freq: Once | INTRAMUSCULAR | Status: AC
  Administered 2023-10-09: 10 mg via INTRA_ARTICULAR

## 2023-10-09 MED ORDER — NURTEC 75 MG PO TBDP
1.0000 | ORAL_TABLET | ORAL | 5 refills | Status: DC
  Filled 2023-10-09 – 2023-11-22 (×3): qty 16, 32d supply, fill #0
  Filled 2023-11-25: qty 15, 30d supply, fill #0
  Filled 2023-12-21: qty 15, 30d supply, fill #1
  Filled 2024-02-27: qty 15, 30d supply, fill #2
  Filled 2024-07-03: qty 15, 30d supply, fill #3
  Filled 2024-08-01: qty 15, 30d supply, fill #4
  Filled 2024-09-02: qty 15, 30d supply, fill #5
  Filled 2024-09-30: qty 15, 30d supply, fill #6

## 2023-10-09 NOTE — Patient Instructions (Addendum)
Nerve study of both arms Start Nurtec 1 tablet EVERY OTHER DAY Ubrelvy as needed Follow up with Dr. Frances Furbish at [facility] for sleep apnea Follow up 6 months

## 2023-10-09 NOTE — Progress Notes (Signed)
Subjective:   Patient ID: Chelsey Hunt, female   DOB: 58 y.o.   MRN: [id number]   HPI Patient states that she has started to develop a lot of pain in the left heel and states that it has been going on for several months neuro   ROS      Objective:  Physical Exam  Vascular status intact inflammation pain plantar heel left insertional point tendon calcaneus     Assessment:  Acute plantar fasciitis left     Plan:  Sterile prep injected the fascia at insertion 3 mg Kenalog 5 mg Xylocaine applied fascial brace to lift up the arch and discussed stretching exercises physical therapy and also discussed possible long-term orthotics.  Reappoint the next several weeks to reevaluate  X-rays indicate small spur no indication stress fracture arthritis

## 2023-10-10 ENCOUNTER — Telehealth: Payer: Self-pay

## 2023-10-10 NOTE — Telephone Encounter (Signed)
Nurtec needs a PA

## 2023-11-01 ENCOUNTER — Other Ambulatory Visit (HOSPITAL_COMMUNITY): Payer: Self-pay

## 2023-11-01 ENCOUNTER — Telehealth: Payer: Self-pay

## 2023-11-01 NOTE — Telephone Encounter (Signed)
PA request has been Submitted. New Encounter created for follow up. For additional info see Pharmacy Prior Auth telephone encounter from 11/01/2023.

## 2023-11-01 NOTE — Telephone Encounter (Signed)
Pharmacy Patient Advocate Encounter   Received notification from Pt Calls Messages that prior authorization for Nurtec 75MG  dispersible tablets is required/requested.   Insurance verification completed.   The patient is insured through [facility] .   Per test claim: PA required; PA submitted to above mentioned insurance via CoverMyMeds Key/confirmation #/EOC [id number] Status is pending

## 2023-11-04 ENCOUNTER — Other Ambulatory Visit (HOSPITAL_COMMUNITY): Payer: Self-pay

## 2023-11-04 NOTE — Telephone Encounter (Signed)
 Pharmacy Patient Advocate Encounter  Received notification from [employer] that Prior Authorization for Nurtec 75MG  dispersible tablets has been APPROVED from 10-31-2022 to 04-29-2024. Ran test claim, Copay is $0.00. Quantity approved 16 tablets per 30 days. This test claim was processed through [facility]- copay amounts may vary at other pharmacies due to pharmacy/plan contracts, or as the patient moves through the different stages of their insurance plan.   PA #/Case ID/Reference #: [id number]

## 2023-11-07 ENCOUNTER — Ambulatory Visit: Payer: Commercial Managed Care - PPO | Admitting: Neurology

## 2023-11-07 DIAGNOSIS — G5601 Carpal tunnel syndrome, right upper limb: Secondary | ICD-10-CM

## 2023-11-07 DIAGNOSIS — R202 Paresthesia of skin: Secondary | ICD-10-CM | POA: Diagnosis not present

## 2023-11-07 DIAGNOSIS — R200 Anesthesia of skin: Secondary | ICD-10-CM | POA: Diagnosis not present

## 2023-11-07 DIAGNOSIS — G5623 Lesion of ulnar nerve, bilateral upper limbs: Secondary | ICD-10-CM

## 2023-11-07 NOTE — Procedures (Signed)
 [facility]  [address] [place], [address]  [place], [place] [address] Tel: [phone] Fax: [phone] Test Date:  11/07/2023  Patient: Chelsey Hunt DOB: 20-Jun-1966 Physician: Nita Sickle, DO  Sex: Female Height: 5\' 2"  Ref Phys: Shon Millet, DO  ID#: [id number]   Technician:    History: This is a 58 year old female referred for evaluation of bilateral hand paresthesias.  NCV & EMG Findings: Extensive electrodiagnostic testing of the right upper extremity and additional studies of the left shows:  Right median sensory response shows prolonged latency (4.0 ms).  Left median, left mixed palmar, and bilateral ulnar sensory responses are within normal limits. Bilateral median motor responses are within normal limits.  Bilateral ulnar motor responses show slowed conduction velocity across the elbow (A Elbow-B Elbow, R40, L37 m/s).   Chronic motor axonal loss changes are seen affecting the right first dorsal interosseous and abductor digiti minimi muscles, without accompanied active denervation.  These findings are not present in the left upper extremity.  Impression: Compared to prior study on 04/19/2021, there is a new right median neuropathy at or distal to the wrist (mild), consistent with a clinical diagnosis of carpal tunnel syndrome.  Additionally, previously seen bilateral ulnar neuropathy with slowing across the elbow (moderate) is mildly progressed on the right and stable on the left.   ___________________________ Nita Sickle, DO    Nerve Conduction Studies   Stim Site NR Peak (ms) Norm Peak (ms) O-P Amp (V) Norm O-P Amp  Left Median Anti Sensory (2nd Digit)  32 C  Wrist    3.3 <3.6 53.6 >15  Right Median Anti Sensory (2nd Digit)  32 C  Wrist    *4.0 <3.6 22.5 >15  Left Ulnar Anti Sensory (5th Digit)  32 C  Wrist    2.7 <3.1 32.5 >10  Right Ulnar Anti Sensory (5th Digit)  32 C  Wrist    2.8 <3.1 18.2 >10     Stim Site NR Onset (ms) Norm Onset (ms)  O-P Amp (mV) Norm O-P Amp Site1 Site2 Delta-0 (ms) Dist (cm) Vel (m/s) Norm Vel (m/s)  Left Median Motor (Abd Poll Brev)  32 C  Wrist    3.2 <4.0 13.6 >6 Elbow Wrist 4.6 29.0 63 >50  Elbow    7.8  13.2         Right Median Motor (Abd Poll Brev)  32 C  Wrist    4.0 <4.0 13.1 >6 Elbow Wrist 4.5 30.0 67 >50  Elbow    8.5  12.3         Left Ulnar Motor (Abd Dig Minimi)  32 C  Wrist    2.4 <3.1 11.6 >7 B Elbow Wrist 4.2 22.0 52 >50  B Elbow    6.6  10.8  A Elbow B Elbow 2.7 10.0 *37 >50  A Elbow    9.3  9.9         Right Ulnar Motor (Abd Dig Minimi)  32 C  Wrist    2.3 <3.1 13.3 >7 B Elbow Wrist 3.8 24.0 63 >50  B Elbow    6.1  12.8  A Elbow B Elbow 2.5 10.0 *40 >50  A Elbow    8.6  10.8            Stim Site NR Peak (ms) Norm Peak (ms) P-T Amp (V) Site1 Site2 Delta-P (ms) Norm Delta (ms)  Left Median/Ulnar Palm Comparison (Wrist - 8cm)  32 C  Median Palm  2.0 <2.2 59.5 Median Palm Ulnar Palm 0.0   Ulnar Palm    2.0 <2.2 23.6       Electromyography   Side Muscle Ins.Act Fibs Fasc Recrt Amp Dur Poly Activation Comment  Right 1stDorInt Nml Nml Nml *1- *1+ *1+ *1+ Nml N/A  Right Abd Poll Brev Nml Nml Nml Nml Nml Nml Nml Nml N/A  Right PronatorTeres Nml Nml Nml Nml Nml Nml Nml Nml N/A  Right Biceps Nml Nml Nml Nml Nml Nml Nml Nml N/A  Right Triceps Nml Nml Nml Nml Nml Nml Nml Nml N/A  Right Deltoid Nml Nml Nml Nml Nml Nml Nml Nml N/A  Right FlexCarpiUln Nml Nml Nml Nml Nml Nml Nml Nml N/A  Right Abd Dig Min Nml Nml Nml *1- *1+ *1+ *1+ Nml N/A  Left FlexCarpiUln Nml Nml Nml Nml Nml Nml Nml Nml N/A  Left Abd Dig Min Nml Nml Nml Nml Nml Nml Nml Nml N/A  Left 1stDorInt Nml Nml Nml Nml Nml Nml Nml Nml N/A  Left PronatorTeres Nml Nml Nml Nml Nml Nml Nml Nml N/A  Left Biceps Nml Nml Nml Nml Nml Nml Nml Nml N/A  Left Triceps Nml Nml Nml Nml Nml Nml Nml Nml N/A  Left Deltoid Nml Nml Nml Nml Nml Nml Nml Nml N/A      Waveforms:

## 2023-11-08 ENCOUNTER — Encounter: Payer: Self-pay | Admitting: Emergency Medicine

## 2023-11-08 NOTE — Progress Notes (Signed)
 Patient advised. Patient will keep trying the Splints for now.

## 2023-11-19 ENCOUNTER — Other Ambulatory Visit (HOSPITAL_COMMUNITY): Payer: Self-pay

## 2023-11-22 ENCOUNTER — Other Ambulatory Visit (HOSPITAL_COMMUNITY): Payer: Self-pay

## 2023-11-22 DIAGNOSIS — Z0279 Encounter for issue of other medical certificate: Secondary | ICD-10-CM

## 2023-11-25 ENCOUNTER — Ambulatory Visit: Admitting: Emergency Medicine

## 2023-11-25 ENCOUNTER — Telehealth: Payer: Self-pay | Admitting: Neurology

## 2023-11-25 ENCOUNTER — Other Ambulatory Visit (HOSPITAL_COMMUNITY): Payer: Self-pay

## 2023-11-25 NOTE — Telephone Encounter (Signed)
 Per pharmacy they were unable to get the Nurtec to run through even tho it was approved 11/01/23.

## 2023-11-25 NOTE — Telephone Encounter (Signed)
 See my chart message

## 2023-11-25 NOTE — Telephone Encounter (Signed)
 Pt called in stating her coordinator let her know they did not get the FMLA paperwork that was faxed on Friday. She is requesting it be faxed again

## 2023-11-26 ENCOUNTER — Ambulatory Visit (INDEPENDENT_AMBULATORY_CARE_PROVIDER_SITE_OTHER)

## 2023-11-26 ENCOUNTER — Encounter: Payer: Self-pay | Admitting: Emergency Medicine

## 2023-11-26 ENCOUNTER — Ambulatory Visit: Admitting: Emergency Medicine

## 2023-11-26 VITALS — BP 122/84 | HR 75 | Temp 98.2°F | Ht 62.0 in | Wt 147.0 lb

## 2023-11-26 DIAGNOSIS — M25511 Pain in right shoulder: Secondary | ICD-10-CM | POA: Diagnosis not present

## 2023-11-26 DIAGNOSIS — G8929 Other chronic pain: Secondary | ICD-10-CM | POA: Diagnosis not present

## 2023-11-26 DIAGNOSIS — M19011 Primary osteoarthritis, right shoulder: Secondary | ICD-10-CM | POA: Diagnosis not present

## 2023-11-26 NOTE — Patient Instructions (Signed)
 Shoulder Pain  Many things can cause shoulder pain, including:  An injury.  Moving the shoulder in the same way again and again (overuse).  Joint pain (arthritis).  Pain can come from:  Swelling and irritation (inflammation) of any part of the shoulder.  An injury to:  The shoulder joint.  Tissues that connect muscle to bone (tendons).  Tissues that connect bones to each other (ligaments).  Bones.  Follow these instructions at home:  Watch for changes in your symptoms. Let your doctor know about them. Follow these instructions to help with your pain.  If you have a sling that can be taken off:  Wear the sling as told by your doctor. Take it off only as told by your doctor.  Check the skin around the sling every day. Tell your doctor if you see problems.  Loosen the sling if your fingers:  Tingle.  Become numb.  Become cold.  Keep the sling clean.  If the sling is not waterproof:  Do not let it get wet.  Take the sling off when you shower or bathe.  Managing pain, stiffness, and swelling    If told, put ice on the painful area.  Put ice in a plastic bag.  Place a towel between your skin and the bag.  Leave the ice on for 20 minutes, 2-3 times a day. Stop putting ice on if it does not help with the pain.  If your skin turns bright red, take off the ice right away to prevent skin damage. The risk of damage is higher if you cannot feel pain, heat, or cold.  Squeeze a soft ball or a foam pad as much as possible. This prevents swelling in the shoulder. It also helps to strengthen the arm.  General instructions  Take over-the-counter and prescription medicines only as told by your doctor.  Keep all follow-up visits. This will help you avoid any type of permanent shoulder problems.  Contact a doctor if:  Your pain gets worse.  Medicine does not help your pain.  You have new pain in your arm, hand, or fingers.  You loosen your sling and your arm, hand, or fingers:  Tingle.  Are numb.  Are swollen.  Get help right away  if:  Your arm, hand, or fingers turn white or blue.  This information is not intended to replace advice given to you by your health care provider. Make sure you discuss any questions you have with your health care provider.  Document Revised: 03/30/2022 Document Reviewed: 03/30/2022  Elsevier Patient Education  2024 [employer].

## 2023-11-26 NOTE — Assessment & Plan Note (Signed)
 Chronic and affecting quality of life Related to activities of daily living Recommend x-ray today Needs orthopedic evaluation Pain management discussed

## 2023-11-26 NOTE — Progress Notes (Signed)
 Chelsey Hunt 58 y.o.   Chief Complaint  Patient presents with   Shoulder Pain    [name] with her Right shoulder pain moves down her arm. Still having numbness, tingling, and tightness. Neuro said it was carpal tunnel     HISTORY OF PRESENT ILLNESS: This is a 58 y.o. 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 No other complaints or medical concerns today.  Shoulder Pain  Pertinent negatives include no fever.     Prior to Admission medications   Medication Sig Start Date End Date Taking? Authorizing Provider  acetaminophen (TYLENOL) 325 MG tablet Take 650 mg by mouth every 6 (six) hours as needed for mild pain.   Yes [provider]  Cholecalciferol (D3 2000 PO) Take by mouth.   Yes [provider]  cyclobenzaprine (FLEXERIL) 5 MG tablet Take 1 tablet (5 mg total) by mouth 3 (three) times daily as needed for muscle spasms. 01/23/22  Yes Jaffe, Adam R, DO  dicyclomine (BENTYL) 10 MG capsule Take 1 capsule (10 mg total) by mouth 3 times daily. as needed 11/07/22  Yes   ezetimibe (ZETIA) 10 MG tablet Take 1 tablet (10 mg total) by mouth every evening. 05/14/23  Yes Anusha Claus, Eilleen Kempf, MD  ibuprofen (ADVIL,MOTRIN) 200 MG tablet Take 200 mg by mouth every 8 (eight) hours as needed.   Yes [provider]  levothyroxine (SYNTHROID) 75 MCG tablet Take 1 tablet (75 mcg total) by mouth daily. 05/14/23  Yes Jacere Pangborn, Eilleen Kempf, MD  magnesium 30 MG tablet Take 30 mg by mouth daily.   Yes [provider]  pantoprazole (PROTONIX) 20 MG tablet Take 1 tablet (20 mg total) by mouth 2 (two) times daily before a meal. Take 30-45 minutes prior to breakfast and dinner. 04/08/23  Yes   promethazine (PHENERGAN) 25 MG tablet Take 1 tablet (25 mg total) by mouth every 6 (six)  hours as needed for nausea or vomiting. 10/29/22  Yes Jaffe, Adam R, DO  Rimegepant Sulfate (NURTEC) 75 MG TBDP Take 1 tablet (75 mg total) by mouth every other day. 10/09/23  Yes Jaffe, Adam R, DO  Ubrogepant (UBRELVY) 100 MG TABS Take 1 tablet (100 mg) by mouth as needed. May repeat dose in 2 hours.  Maximum 2 tablets in 24 hours. 10/09/23  Yes Jaffe, Adam R, DO  valACYclovir (VALTREX) 1000 MG tablet Take 1 tablet (1,000 mg total) by mouth every 12 (twelve) hours for 5 days. 10/01/23  Yes     Allergies  Allergen Reactions   Imitrex [Sumatriptan] Palpitations   Reglan [Metoclopramide] Palpitations   Rosuvastatin Other (See Comments)    Arthralgia and myalgia   Propoxyphene N-Acetaminophen    Nabumetone Rash    REACTION: GI upset   Topiramate Rash    REACTION: rash    Patient Active Problem List   Diagnosis Date Noted   Musculoskeletal neck pain 06/12/2022   Cervical radiculopathy 04/10/2022   Statin intolerance 03/08/2022   Family history of heart disease 11/15/2021   Obstructive sleep apnea 09/07/2021   Prediabetes 09/07/2021   Dyslipidemia 08/14/2021   Positive ANA (antinuclear antibody) 04/20/2021   Finger deformity 04/20/2021   Raynaud phenomenon 09/17/2019   Anxiety 07/30/2019   Chronic idiopathic constipation 01/08/2019   Psychophysiological insomnia 07/07/2018   Seasonal  allergic rhinitis due to pollen 08/08/2017   Intractable migraine without aura and without status migrainosus 07/02/2016   Pancreas divisum of native pancreas 12/27/2014   Uterine leiomyoma 09/26/2012   GERD (gastroesophageal reflux disease)    Hypothyroidism 08/12/2009    Past Medical History:  Diagnosis Date   ACID REFLUX DISEASE 01/03/2008   Anemia    ANEMIA, IRON DEFICIENCY 12/23/2008   history   Anxiety    Chronic tension type headache 01/13/2009   migraines   Complication of anesthesia    pt states she just needs a small amount or she will sleep too long   Dysplasia of cervix, low grade (CIN  1)    Fibroid    GERD (gastroesophageal reflux disease)    HEART MURMUR, SYSTOLIC 07/07/2010   will have echo 06/18/2013   HYPOTHYROIDISM, POST-RADIATION 08/12/2009   Irritable bowel syndrome 12/23/2008   Migraine    Ovarian cyst    Ovarian cyst    THYROID NODULE, RIGHT 04/21/2009    Past Surgical History:  Procedure Laterality Date   ABDOMINAL HYSTERECTOMY     COLONOSCOPY     HYSTEROSCOPY     LAPAROSCOPY     NOVASURE ABLATION     POLYPECTOMY     TUBAL LIGATION     UPPER GI ENDOSCOPY     VAGINAL HYSTERECTOMY N/A 06/24/2013   Procedure: TOTAL HYSTERECTOMY VAGINAL;  Surgeon: Esmeralda Arthur, MD;  Location: [facility];  Service: Gynecology;  Laterality: N/A;    Social History   Socioeconomic History   Marital status: Married    Spouse name: Onalee Hua   Number of children: 3   Years of education: Not on file   Highest education level: Some college, no degree  Occupational History   Occupation: WOMENS    Employer: [employer]  Tobacco Use   Smoking status: Never    Passive exposure: Past   Smokeless tobacco: Never  Vaping Use   Vaping status: Never Used  Substance and Sexual Activity   Alcohol use: No   Drug use: No   Sexual activity: Yes    Birth control/protection: Surgical    Comment: BTL  Other Topics Concern   Not on file  Social History Narrative   Regular exercise-yes      Patient is right-handed.She lives with her husband in a 1 story home. She has recently been avoiding caffeine. Walks.   Right handed   Does work   Social Drivers of Corporate investment banker Strain: Low Risk  (11/25/2023)   Overall Financial Resource Strain (CARDIA)    Difficulty of Paying Living Expenses: Not very hard  Food Insecurity: No Food Insecurity (11/25/2023)   Hunger Vital Sign    Worried About Running Out of Food in the Last Year: Never true    Ran Out of Food in the Last Year: Never true  Transportation Needs: No Transportation Needs (11/25/2023)   PRAPARE - Therapist, art (Medical): No    Lack of Transportation (Non-Medical): No  Physical Activity: Insufficiently Active (11/25/2023)   Exercise Vital Sign    Days of Exercise per Week: 1 day    Minutes of Exercise per Session: 20 min  Stress: No Stress Concern Present (11/25/2023)   [employer] of Occupational Health - Occupational Stress Questionnaire    Feeling of Stress : Only a little  Social Connections: Socially Integrated (11/25/2023)   Social Connection and Isolation Panel [NHANES]    Frequency of Communication with Friends  and Family: More than three times a week    Frequency of Social Gatherings with Friends and Family: Once a week    Attends Religious Services: 1 to 4 times per year    Active Member of [employer] or Organizations: Yes    Attends Banker Meetings: 1 to 4 times per year    Marital Status: Married  Catering manager Violence: Not on file    Family History  Problem Relation Age of Onset   Heart disease Mother    Thyroid disease Mother    Heart attack Mother    Cancer Father    Heart disease Father    Heart attack Father    Throat cancer Father    Lung cancer Father    Alcoholism Father    Graves' disease Sister    Asthma Sister    Hepatitis Sister    Stomach cancer Sister    CVA Brother    Alzheimer's disease Brother    Goiter Brother    Hypertension Brother    Hypertension Brother    Alcoholism Brother    Hyperlipidemia Maternal Aunt    Hypertension Paternal Grandfather    Healthy Daughter    GER disease Son    GER disease Son    Goiter Other        Siblings 2   Depression Neg Hx    Alcohol abuse Neg Hx    Drug abuse Neg Hx    Early death Neg Hx    Hearing loss Neg Hx    Kidney disease Neg Hx    Stroke Neg Hx      Review of Systems  Constitutional: Negative.  Negative for chills and fever.  HENT: Negative.  Negative for congestion and sore throat.   Respiratory: Negative.  Negative for cough and shortness of breath.    Cardiovascular: Negative.  Negative for chest pain and palpitations.  Gastrointestinal:  Negative for abdominal pain, diarrhea, nausea and vomiting.  Genitourinary: Negative.  Negative for dysuria and hematuria.  Musculoskeletal:  Positive for joint pain (Right shoulder pain).  Skin: Negative.  Negative for rash.  Neurological: Negative.  Negative for dizziness and headaches.  All other systems reviewed and are negative.   Vitals:   11/26/23 0914  BP: 122/84  Pulse: 75  Temp: 98.2 F (36.8 C)  SpO2: 95%    Physical Exam Vitals reviewed.  Constitutional:      Appearance: Normal appearance.  HENT:     Head: Normocephalic.  Eyes:     Extraocular Movements: Extraocular movements intact.  Cardiovascular:     Rate and Rhythm: Normal rate.  Pulmonary:     Effort: Pulmonary effort is normal.  Musculoskeletal:     Comments: Right shoulder: Full range of motion but complaining of pain Right upper extremity: Neurovascularly intact  Skin:    General: Skin is warm and dry.     Capillary Refill: Capillary refill takes less than 2 seconds.  Neurological:     General: No focal deficit present.     Mental Status: She is alert and oriented to person, place, and time.  Psychiatric:        Mood and Affect: Mood normal.        Behavior: Behavior normal.    DG Shoulder Right Result Date: 11/26/2023 CLINICAL DATA:  Right shoulder pain after pushing heavy dorsally apparently at work. EXAM: RIGHT SHOULDER - 2+ VIEW COMPARISON:  None Available. FINDINGS: Normal bone mineralization. Mild acromioclavicular joint space narrowing and peripheral  spurring. The glenohumeral joint space is maintained. No acute fracture or dislocation. Question of a normal variant persistent os acromiale seen on transscapular Y-view. IMPRESSION: Mild acromioclavicular osteoarthritis. Question of a normal variant persistent os acromiale. Electronically Signed   By: Neita Garnet M.D.   On: 11/26/2023 11:28      ASSESSMENT & PLAN: A total of 33 minutes was spent with the patient and counseling/coordination of care regarding preparing for this visit, review of most recent office visit notes, review of chronic medical conditions, review of all medications, differential diagnosis of chronic right shoulder pain, pain management, review of x-ray images done today, need for orthopedic evaluation, prognosis, documentation, and need for follow-up  Problem List Items Addressed This Visit       Other   Chronic right shoulder pain - Primary   Chronic and affecting quality of life Related to activities of daily living Recommend x-ray today Needs orthopedic evaluation Pain management discussed      Relevant Orders   Ambulatory referral to Orthopedic Surgery   DG Shoulder Right   Patient Instructions  Shoulder Pain Many things can cause shoulder pain, including: An injury. Moving the shoulder in the same way again and again (overuse). Joint pain (arthritis). Pain can come from: Swelling and irritation (inflammation) of any part of the shoulder. An injury to: The shoulder joint. Tissues that connect muscle to bone (tendons). Tissues that connect bones to each other (ligaments). Bones. Follow these instructions at home: Watch for changes in your symptoms. Let your doctor know about them. Follow these instructions to help with your pain. If you have a sling that can be taken off: Wear the sling as told by your doctor. Take it off only as told by your doctor. Check the skin around the sling every day. Tell your doctor if you see problems. Loosen the sling if your fingers: Tingle. Become numb. Become cold. Keep the sling clean. If the sling is not waterproof: Do not let it get wet. Take the sling off when you shower or bathe. Managing pain, stiffness, and swelling  If told, put ice on the painful area. Put ice in a plastic bag. Place a towel between your skin and the bag. Leave the ice  on for 20 minutes, 2-3 times a day. Stop putting ice on if it does not help with the pain. If your skin turns bright red, take off the ice right away to prevent skin damage. The risk of damage is higher if you cannot feel pain, heat, or cold. Squeeze a soft ball or a foam pad as much as possible. This prevents swelling in the shoulder. It also helps to strengthen the arm. General instructions Take over-the-counter and prescription medicines only as told by your doctor. Keep all follow-up visits. This will help you avoid any type of permanent shoulder problems. Contact a doctor if: Your pain gets worse. Medicine does not help your pain. You have new pain in your arm, hand, or fingers. You loosen your sling and your arm, hand, or fingers: Tingle. Are numb. Are swollen. Get help right away if: Your arm, hand, or fingers turn white or blue. This information is not intended to replace advice given to you by your health care provider. Make sure you discuss any questions you have with your health care provider. Document Revised: 03/30/2022 Document Reviewed: 03/30/2022 Elsevier Patient Education  2024 Elsevier Inc.    Edwina Barth, MD [facility]

## 2023-11-27 ENCOUNTER — Other Ambulatory Visit (HOSPITAL_COMMUNITY): Payer: Self-pay

## 2023-11-27 NOTE — Telephone Encounter (Signed)
 Was filled on 11/26/2023 at [facility] at [facility]

## 2023-12-11 ENCOUNTER — Ambulatory Visit: Admitting: Orthopedic Surgery

## 2023-12-11 DIAGNOSIS — M25511 Pain in right shoulder: Secondary | ICD-10-CM

## 2023-12-14 ENCOUNTER — Encounter: Payer: Self-pay | Admitting: Orthopedic Surgery

## 2023-12-14 NOTE — Progress Notes (Signed)
 Office Visit Note   Patient: Chelsey Hunt           Date of Birth: 1966-07-24           MRN: [id number] Visit Date: 12/11/2023 Requested by: Georgina Quint, MD [address] [place],  [place] [address] PCP: Georgina Quint, MD  Subjective: Chief Complaint  Patient presents with   Right Shoulder - Pain   Neck - Pain    HPI: Chelsey Hunt is a 58 y.o. female who presents to the office reporting right shoulder pain on and off for a year.  She is right-hand dominant.  Denies any history of injury.  Describes radicular type pain along with right upper extremity weakness.  She also describes numbness and tingling with neck pain and the arm feeling heavy.  Also describes having scapular pain.  Arm overhead gives relief.  Reports increased pain at night.  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  Using splints with some relief.  That EMG nerve study is reviewed.  She also has had an MRI scan of her cervical spine which is reviewed which shows cervical spondylosis and degenerative disc disease causing mild right foraminal stenosis at C4-5 similar to prior.  Neurosurgical referral has been made.  She has also had plain radiographs of the right shoulder which shows questionable atypical os acromiale.  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                 ROS: All systems reviewed are negative as they relate to the chief complaint within the history of present illness.  Patient denies fevers or chills.  Assessment & Plan: Visit Diagnoses:  1. Right shoulder pain, unspecified chronicity     Plan: Impression is right shoulder pain.  Ongoing for a year.  Failure of conservative management.  This could have a radicular component with the scapular pain and improvement in symptoms with the arm up overhead.  She has tried a physical therapy type  program of stretching and strengthening for longer than 6 weeks without relief.  MRI arthrogram right shoulder indicated to rule out rotator cuff pathology.  If no distinct pathology is visible then this really does look like a radicular problem with nerve compression either at the wrist elbow or neck.  Follow-Up Instructions: No follow-ups on file.   Orders:  Orders Placed This Encounter  Procedures   MR SHOULDER RIGHT W CONTRAST   Arthrogram   No orders of the defined types were placed in this encounter.     Procedures: No procedures performed   Clinical Data: No additional findings.  Objective: Vital Signs: LMP 04/19/2013   Physical Exam:  Constitutional: Patient appears well-developed HEENT:  Head: Normocephalic Eyes:EOM are normal Neck: Normal range of motion Cardiovascular: Normal rate Pulmonary/chest: Effort normal Neurologic: Patient is alert Skin: Skin is warm Psychiatric: Patient has normal mood and affect  Ortho Exam: Ortho exam demonstrates pretty reasonable cervical spine range of motion.  5 out of 5 grip EPL FPL interosseous wrist lection extension bicep tricep abductor strength.  Radial pulse intact.  No subluxation ulnar nerve at either elbow.  No abductor pollicis brevis wasting.  Bilateral shoulder range of motion is 60/95/160.  Little bit of crepitus on the right compared to the left at 90 degrees of abduction with internal and  external rotation.  No discrete AC joint tenderness right versus left.  Specialty Comments:  No specialty comments available.  Imaging: No results found.   PMFS History: Patient Active Problem List   Diagnosis Date Noted   Chronic right shoulder pain 11/26/2023   Musculoskeletal neck pain 06/12/2022   Cervical radiculopathy 04/10/2022   Statin intolerance 03/08/2022   Family history of heart disease 11/15/2021   Obstructive sleep apnea 09/07/2021   Prediabetes 09/07/2021   Dyslipidemia 08/14/2021   Positive ANA  (antinuclear antibody) 04/20/2021   Finger deformity 04/20/2021   Raynaud phenomenon 09/17/2019   Anxiety 07/30/2019   Chronic idiopathic constipation 01/08/2019   Psychophysiological insomnia 07/07/2018   Seasonal allergic rhinitis due to pollen 08/08/2017   Intractable migraine without aura and without status migrainosus 07/02/2016   Pancreas divisum of native pancreas 12/27/2014   Uterine leiomyoma 09/26/2012   GERD (gastroesophageal reflux disease)    Hypothyroidism 08/12/2009   Past Medical History:  Diagnosis Date   ACID REFLUX DISEASE 01/03/2008   Anemia    ANEMIA, IRON DEFICIENCY 12/23/2008   history   Anxiety    Chronic tension type headache 01/13/2009   migraines   Complication of anesthesia    pt states she just needs a small amount or she will sleep too long   Dysplasia of cervix, low grade (CIN 1)    Fibroid    GERD (gastroesophageal reflux disease)    HEART MURMUR, SYSTOLIC 07/07/2010   will have echo 06/18/2013   HYPOTHYROIDISM, POST-RADIATION 08/12/2009   Irritable bowel syndrome 12/23/2008   Migraine    Ovarian cyst    Ovarian cyst    THYROID NODULE, RIGHT 04/21/2009    Family History  Problem Relation Age of Onset   Heart disease Mother    Thyroid disease Mother    Heart attack Mother    Cancer Father    Heart disease Father    Heart attack Father    Throat cancer Father    Lung cancer Father    Alcoholism Father    Graves' disease Sister    Asthma Sister    Hepatitis Sister    Stomach cancer Sister    CVA Brother    Alzheimer's disease Brother    Goiter Brother    Hypertension Brother    Hypertension Brother    Alcoholism Brother    Hyperlipidemia Maternal Aunt    Hypertension Paternal Grandfather    Healthy Daughter    GER disease Son    GER disease Son    Goiter Other        Siblings 2   Depression Neg Hx    Alcohol abuse Neg Hx    Drug abuse Neg Hx    Early death Neg Hx    Hearing loss Neg Hx    Kidney disease Neg Hx    Stroke Neg  Hx     Past Surgical History:  Procedure Laterality Date   ABDOMINAL HYSTERECTOMY     COLONOSCOPY     HYSTEROSCOPY     LAPAROSCOPY     NOVASURE ABLATION     POLYPECTOMY     TUBAL LIGATION     UPPER GI ENDOSCOPY     VAGINAL HYSTERECTOMY N/A 06/24/2013   Procedure: TOTAL HYSTERECTOMY VAGINAL;  Surgeon: Esmeralda Arthur, MD;  Location: [facility];  Service: Gynecology;  Laterality: N/A;   Social History   Occupational History   Occupation: WOMENS    Employer: [employer]  Tobacco Use   Smoking status:  Never    Passive exposure: Past   Smokeless tobacco: Never  Vaping Use   Vaping status: Never Used  Substance and Sexual Activity   Alcohol use: No   Drug use: No   Sexual activity: Yes    Birth control/protection: Surgical    Comment: BTL

## 2023-12-17 DIAGNOSIS — H524 Presbyopia: Secondary | ICD-10-CM | POA: Diagnosis not present

## 2023-12-21 ENCOUNTER — Other Ambulatory Visit: Payer: Self-pay | Admitting: Neurology

## 2023-12-21 DIAGNOSIS — T148XXA Other injury of unspecified body region, initial encounter: Secondary | ICD-10-CM

## 2023-12-23 ENCOUNTER — Other Ambulatory Visit (HOSPITAL_COMMUNITY): Payer: Self-pay

## 2023-12-23 ENCOUNTER — Other Ambulatory Visit: Payer: Self-pay

## 2023-12-23 MED ORDER — CYCLOBENZAPRINE HCL 5 MG PO TABS
5.0000 mg | ORAL_TABLET | Freq: Three times a day (TID) | ORAL | 2 refills | Status: AC | PRN
  Filled 2023-12-23: qty 30, 10d supply, fill #0
  Filled 2024-08-12: qty 30, 10d supply, fill #1

## 2024-01-01 ENCOUNTER — Encounter: Payer: Self-pay | Admitting: Orthopedic Surgery

## 2024-01-06 ENCOUNTER — Ambulatory Visit: Admitting: Orthopedic Surgery

## 2024-01-07 ENCOUNTER — Ambulatory Visit
Admission: RE | Admit: 2024-01-07 | Discharge: 2024-01-07 | Disposition: A | Discharge status: Home / Self Care | Source: Ambulatory Visit | Attending: Orthopedic Surgery | Admitting: Orthopedic Surgery

## 2024-01-07 DIAGNOSIS — M7581 Other shoulder lesions, right shoulder: Secondary | ICD-10-CM | POA: Diagnosis not present

## 2024-01-07 DIAGNOSIS — M25511 Pain in right shoulder: Secondary | ICD-10-CM | POA: Diagnosis not present

## 2024-01-07 DIAGNOSIS — M19011 Primary osteoarthritis, right shoulder: Secondary | ICD-10-CM | POA: Diagnosis not present

## 2024-01-07 MED ORDER — IOPAMIDOL (ISOVUE-M 200) INJECTION 41%
10.0000 mL | Freq: Once | INTRAMUSCULAR | Status: AC
  Administered 2024-01-07: 10 mL via INTRA_ARTICULAR

## 2024-01-15 ENCOUNTER — Ambulatory Visit: Admitting: Orthopedic Surgery

## 2024-01-15 ENCOUNTER — Encounter: Payer: Self-pay | Admitting: Orthopedic Surgery

## 2024-01-15 DIAGNOSIS — M25511 Pain in right shoulder: Secondary | ICD-10-CM | POA: Diagnosis not present

## 2024-01-15 NOTE — Progress Notes (Signed)
 Office Visit Note   Patient: Chelsey Hunt           Date of Birth: [birth year]           MRN: [id number] Visit Date: 01/15/2024 Requested by: Elvira Hammersmith, MD [address] [place],  [place] [address] PCP: Elvira Hammersmith, MD  Subjective: Chief Complaint  Patient presents with   Other    Review scan    HPI: Chelsey Hunt is a 58 y.o. female who presents to the office reporting right shoulder pain.  She reports some numbness and tingling going down the arm but not all the time.  It is from the elbow to the wrist.  She has seen neurosurgery.  Uses Tiger balm patch over her right deltoid.  Hard for her to pull things.  She has good and bad days.  Shoulder MRI scan is reviewed.  Rotator cuff is intact.  Biceps tendon intact.  No arthritis.  No discrete AC joint tenderness.  Little bit of degenerative changes at the biceps anchor..                ROS: All systems reviewed are negative as they relate to the chief complaint within the history of present illness.  Patient denies fevers or chills.  Assessment & Plan: Visit Diagnoses:  1. Right shoulder pain, unspecified chronicity     Plan: Impression is right shoulder pain.  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  In general nothing definitively operative is visible on my reading of the MRI scan today.  Neck step would be glenohumeral joint injection.  Instead we will try physical therapy here 1-2 times a week for 6 weeks for primarily strengthening and range of motion and modality exercises.  Follow-up with us  as needed.  Follow-Up Instructions: No follow-ups on file.   Orders:  Orders Placed This Encounter  Procedures   Ambulatory referral to Physical Therapy   No orders of the defined types were placed in this encounter.     Procedures: No procedures performed   Clinical Data: No additional findings.  Objective: Vital Signs: LMP  04/19/2013   Physical Exam:  Constitutional: Patient appears well-developed HEENT:  Head: Normocephalic Eyes:EOM are normal Neck: Normal range of motion Cardiovascular: Normal rate Pulmonary/chest: Effort normal Neurologic: Patient is alert Skin: Skin is warm Psychiatric: Patient has normal mood and affect  Ortho Exam: Ortho exam demonstrates good cervical spine range of motion.  5 out of 5 grip EPL FPL interosseous wrist flexion extension bicep triceps and deltoid strength.  Shoulder range of motion is 70/100/170.  Good rotator cuff strength infraspinatus supraspinatus and subscap muscle testing with no AC joint tenderness and negative O'Brien's testing today.  Specialty Comments:  No specialty comments available.  Imaging: No results found.   PMFS History: Patient Active Problem List   Diagnosis Date Noted   Chronic right shoulder pain 11/26/2023   Musculoskeletal neck pain 06/12/2022   Cervical radiculopathy 04/10/2022   Statin intolerance 03/08/2022   Family history of heart disease 11/15/2021   Obstructive sleep apnea 09/07/2021   Prediabetes 09/07/2021   Dyslipidemia 08/14/2021   Positive ANA (antinuclear antibody) 04/20/2021   Finger deformity 04/20/2021   Raynaud phenomenon 09/17/2019   Anxiety 07/30/2019   Chronic idiopathic constipation 01/08/2019   Psychophysiological insomnia 07/07/2018   Seasonal allergic rhinitis due to pollen 08/08/2017   Intractable migraine without aura and without status migrainosus 07/02/2016  Pancreas divisum of native pancreas 12/27/2014   Uterine leiomyoma 09/26/2012   GERD (gastroesophageal reflux disease)    Hypothyroidism 08/12/2009   Past Medical History:  Diagnosis Date   ACID REFLUX DISEASE 01/03/2008   Anemia    ANEMIA, IRON DEFICIENCY 12/23/2008   history   Anxiety    Chronic tension type headache 01/13/2009   migraines   Complication of anesthesia    pt states she just needs a small amount or she will sleep too long    Dysplasia of cervix, low grade (CIN 1)    Fibroid    GERD (gastroesophageal reflux disease)    HEART MURMUR, SYSTOLIC 07/07/2010   will have echo 06/18/2013   HYPOTHYROIDISM, POST-RADIATION 08/12/2009   Irritable bowel syndrome 12/23/2008   Migraine    Ovarian cyst    Ovarian cyst    THYROID  NODULE, RIGHT 04/21/2009    Family History  Problem Relation Age of Onset   Heart disease Mother    Thyroid  disease Mother    Heart attack Mother    Cancer Father    Heart disease Father    Heart attack Father    Throat cancer Father    Lung cancer Father    Alcoholism Father    Graves' disease Sister    Asthma Sister    Hepatitis Sister    Stomach cancer Sister    CVA Brother    Alzheimer's disease Brother    Goiter Brother    Hypertension Brother    Hypertension Brother    Alcoholism Brother    Hyperlipidemia Maternal Aunt    Hypertension Paternal Grandfather    Healthy Daughter    GER disease Son    GER disease Son    Goiter Other        Siblings 2   Depression Neg Hx    Alcohol abuse Neg Hx    Drug abuse Neg Hx    Early death Neg Hx    Hearing loss Neg Hx    Kidney disease Neg Hx    Stroke Neg Hx     Past Surgical History:  Procedure Laterality Date   ABDOMINAL HYSTERECTOMY     COLONOSCOPY     HYSTEROSCOPY     LAPAROSCOPY     NOVASURE ABLATION     POLYPECTOMY     TUBAL LIGATION     UPPER GI ENDOSCOPY     VAGINAL HYSTERECTOMY N/A 06/24/2013   Procedure: TOTAL HYSTERECTOMY VAGINAL;  Surgeon: Mckinley Spells, MD;  Location: [facility];  Service: Gynecology;  Laterality: N/A;   Social History   Occupational History   Occupation: WOMENS    Employer: [employer]  Tobacco Use   Smoking status: Never    Passive exposure: Past   Smokeless tobacco: Never  Vaping Use   Vaping status: Never Used  Substance and Sexual Activity   Alcohol use: No   Drug use: No   Sexual activity: Yes    Birth control/protection: Surgical    Comment: BTL

## 2024-01-23 ENCOUNTER — Encounter: Payer: Self-pay | Admitting: Rehabilitative and Restorative Service Providers"

## 2024-01-23 ENCOUNTER — Ambulatory Visit: Admitting: Rehabilitative and Restorative Service Providers"

## 2024-01-23 DIAGNOSIS — M25611 Stiffness of right shoulder, not elsewhere classified: Secondary | ICD-10-CM | POA: Diagnosis not present

## 2024-01-23 DIAGNOSIS — M6281 Muscle weakness (generalized): Secondary | ICD-10-CM | POA: Diagnosis not present

## 2024-01-23 DIAGNOSIS — G8929 Other chronic pain: Secondary | ICD-10-CM

## 2024-01-23 DIAGNOSIS — R600 Localized edema: Secondary | ICD-10-CM | POA: Diagnosis not present

## 2024-01-23 DIAGNOSIS — M25511 Pain in right shoulder: Secondary | ICD-10-CM

## 2024-01-23 NOTE — Therapy (Signed)
 OUTPATIENT PHYSICAL THERAPY SHOULDER EVALUATION   Patient Name: Chelsey Hunt MRN: [id number] DOB:1966/02/20, 58 y.o., female Today's Date: 01/23/2024  END OF SESSION:  PT End of Session - 01/23/24 1804     Visit Number 1    Number of Visits 16    Date for PT Re-Evaluation 03/19/24    Authorization Type [place] [employer]    Progress Note Due on Visit 16    PT Start Time 0935    PT Stop Time 1017    PT Time Calculation (min) 42 min    Activity Tolerance Patient tolerated treatment well;No increased pain;Patient limited by pain    Behavior During Therapy [facility] for tasks assessed/performed             Past Medical History:  Diagnosis Date   ACID REFLUX DISEASE 01/03/2008   Anemia    ANEMIA, IRON DEFICIENCY 12/23/2008   history   Anxiety    Chronic tension type headache 01/13/2009   migraines   Complication of anesthesia    pt states she just needs a small amount or she will sleep too long   Dysplasia of cervix, low grade (CIN 1)    Fibroid    GERD (gastroesophageal reflux disease)    HEART MURMUR, SYSTOLIC 07/07/2010   will have echo 06/18/2013   HYPOTHYROIDISM, POST-RADIATION 08/12/2009   Irritable bowel syndrome 12/23/2008   Migraine    Ovarian cyst    Ovarian cyst    THYROID  NODULE, RIGHT 04/21/2009   Past Surgical History:  Procedure Laterality Date   ABDOMINAL HYSTERECTOMY     COLONOSCOPY     HYSTEROSCOPY     LAPAROSCOPY     NOVASURE ABLATION     POLYPECTOMY     TUBAL LIGATION     UPPER GI ENDOSCOPY     VAGINAL HYSTERECTOMY N/A 06/24/2013   Procedure: TOTAL HYSTERECTOMY VAGINAL;  Surgeon: Mckinley Spells, MD;  Location: [facility];  Service: Gynecology;  Laterality: N/A;   Patient Active Problem List   Diagnosis Date Noted   Chronic right shoulder pain 11/26/2023   Musculoskeletal neck pain 06/12/2022   Cervical radiculopathy 04/10/2022   Statin intolerance 03/08/2022   Family history of heart disease 11/15/2021   Obstructive sleep apnea 09/07/2021    Prediabetes 09/07/2021   Dyslipidemia 08/14/2021   Positive ANA (antinuclear antibody) 04/20/2021   Finger deformity 04/20/2021   Raynaud phenomenon 09/17/2019   Anxiety 07/30/2019   Chronic idiopathic constipation 01/08/2019   Psychophysiological insomnia 07/07/2018   Seasonal allergic rhinitis due to pollen 08/08/2017   Intractable migraine without aura and without status migrainosus 07/02/2016   Pancreas divisum of native pancreas 12/27/2014   Uterine leiomyoma 09/26/2012   GERD (gastroesophageal reflux disease)    Hypothyroidism 08/12/2009    PCP: Elvira Hammersmith, MD  REFERRING PROVIDER: Jasmine Mesi, PT, MPT  REFERRING DIAG: M25.511 (ICD-10-CM) - Right shoulder pain, unspecified chronicity  THERAPY DIAG:  Muscle weakness (generalized)  Localized edema  Stiffness of right shoulder, not elsewhere classified  Chronic right shoulder pain  Rationale for Evaluation and Treatment: Rehabilitation  ONSET DATE: Chronic, worse over the last year  SUBJECTIVE:  SUBJECTIVE STATEMENT: Chelsey Hunt notes chronic shoulder pain.  Recent MRI shows small degenerative changes at the biceps anchor.  She is "very right handed" and thinks pushing and navigating heavy doors at work is the main cause.  Hand dominance: Right  PERTINENT HISTORY: Migraines, hypothyroid, hysterectomy, cervical radiculopathy, pre-diabetes  PAIN:  Are you having pain? Yes: NPRS scale: 0-6/10 this week Pain location: Rt shoulder Pain description: Ache Aggravating factors: Lie on the right side, doors at work, Chiropractor, lifting Relieving factors: Tiger balm, ice, heat, rest, muscle relaxer (1/2 of a 5 mg)  PRECAUTIONS: Shoulder  RED FLAGS: None   WEIGHT BEARING RESTRICTIONS: No  FALLS:  Has patient fallen in  last 6 months? 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  DIAGNOSTIC FINDINGS:  See Dr. Eliberto Grosser note  PATIENT SURVEYS:  Patient-Specific Activity Scoring Scheme  "0" represents "unable to perform." "10" represents "able to perform at prior level. 0 1 2 3 4 5 6 7 8 9  10 (Date and Score)   Activity Eval     1.  Throw Corn hole  5/10    2.  Lift greater than 10 pounds 4/10    3.  Exercise 5/10   4.    5.    Score 4.67    Total score = sum of the activity scores/number of activities Minimum detectable change (90%CI) for average score = 2 points Minimum detectable change (90%CI) for single activity score = 3 points     COGNITION: Overall cognitive status: Within functional limits for tasks assessed     SENSATION: Occasional Rt > Lt hand tingling and numbness, no particular pattern noted (median/ulnar/cervical spine)  POSTURE: Mild forward head, internally rotated and protracted shoulders  UPPER EXTREMITY ROM:   Passive ROM Left/Right 01/23/2024   Shoulder flexion 165/160   Shoulder extension    Shoulder abduction    Shoulder horizontal adduction 35/35   Shoulder internal rotation 65/30   Shoulder external rotation 90/90   Elbow flexion    Elbow extension    Wrist flexion    Wrist extension    Wrist ulnar deviation    Wrist radial deviation    Wrist pronation    Wrist supination    (Blank rows = not tested)  UPPER EXTREMITY STRENGTH:  In pounds assessed with hand-held dynamometer Left/Right 01/23/2024   Shoulder flexion    Shoulder extension    Shoulder  abduction    Shoulder adduction    Shoulder internal rotation 30.5/22.7   Shoulder external rotation 18.2/5.3   Middle trapezius    Lower trapezius    Elbow flexion    Elbow extension    Wrist flexion    Wrist extension    Wrist ulnar deviation    Wrist radial deviation    Wrist pronation    Wrist supination    Grip strength (lbs)    (Blank rows = not tested)  TREATMENT DATE: 01/23/2024 Supine arm raises/scapular protraction 10 x 3 seconds Shoulder blade pinches/scapular retraction 10 x 5 seconds Supine IR stretch 10 x 10 seconds Thera-Band external rotation red 10 x 3 seconds  04540: Reviewed imaging and shoulder model to discuss mechanisms of impingement, examination findings, goals of therapeutic interventions and day 1 home exercises  PATIENT EDUCATION: Education details: See above Person educated: Patient Education method: Explanation, Demonstration, Tactile cues, Verbal cues, and Handouts Education comprehension: verbalized understanding, returned demonstration, verbal cues required, tactile cues required, and needs further education  HOME EXERCISE PROGRAM: Access Code: [email or web address] URL: [email or web address] Date: 01/23/2024 Prepared by: Terral Ferrari  Exercises - Supine Scapular Protraction in Flexion with Dumbbells  - 2 x daily - 7 x weekly - 1 sets - 30 reps - 3 seconds hold - Supine Shoulder Internal Rotation Stretch  - 2 x daily - 7 x weekly - 1 sets - 10-20 reps - 10 seconds hold - Standing Scapular Retraction  - 5 x daily - 7 x weekly - 1 sets - 5 reps - 5 second hold - Shoulder External Rotation with Anchored Resistance  - 2 x daily - 7 x weekly - 1 sets - 10 reps - 3 hold  ASSESSMENT:  CLINICAL IMPRESSION: Patient is a 58 y.o. female who was seen today for physical therapy evaluation and treatment for M25.511 (ICD-10-CM) - Right shoulder pain, unspecified  chronicity.  Chelsey Hunt has a greater than 1 year history of right shoulder pain with symptoms worsening over the last several months.  She has very limited right shoulder internal rotation active range of motion and very weak right shoulder external rotation.  These were addressed with her day 1 home exercise program along with some education.  Her prognosis to meet the below listed goals is good with the recommended plan of care.  OBJECTIVE IMPAIRMENTS: decreased activity tolerance, decreased endurance, decreased knowledge of condition, decreased ROM, decreased strength, decreased safety awareness, increased edema, impaired perceived functional ability, impaired flexibility, impaired UE functional use, and pain.   ACTIVITY LIMITATIONS: carrying, lifting, sleeping, and reach over head  PARTICIPATION LIMITATIONS: driving, community activity, and occupation  PERSONAL FACTORS: Migraines, hypothyroid, hysterectomy, cervical radiculopathy, pre-diabetes are also affecting patient's functional outcome.   REHAB POTENTIAL: Good  CLINICAL DECISION MAKING: Stable/uncomplicated  EVALUATION COMPLEXITY: Low   GOALS: Goals reviewed with patient? Yes  SHORT TERM GOALS: Target date: 02/20/2024  Sylena will be independent with her day 1 home exercise program Baseline: Started 01/23/2024 Goal status: INITIAL  2.  Improve right shoulder internal rotation active range of motion to at least 50 degrees Baseline: 30 degrees Goal status: INITIAL  3.  Improve right shoulder external rotation strength to at least 10 pounds Baseline: 5.3 pounds Goal status: INITIAL   LONG TERM GOALS: Target date: 03/19/2024  Improve Chelsey Hunt's score on the patient's specific functional scale to at least 7/10 Baseline: 4.67/10 Goal status: INITIAL  2.  Chelsey Hunt will report right shoulder pain no greater than 3/10 on the numeric pain rating scale Baseline: Can be 6/10 Goal status: INITIAL  3.  Improve bilateral  shoulder active range of motion for flexion to 170, horizontal adduction to 40 and internal rotation to at least 60 degrees Baseline: 165/160; 35/35 and 65/30 respectively Goal status: INITIAL  4.  Improve right shoulder strength for internal rotation to at least 30 pounds and external rotation to at least 20 pounds Baseline: 22.7 and 5.3 respectively Goal status: INITIAL  5.  Chelsey Hunt will be independent with her  long-term home maintenance exercise program at discharge Baseline: Started 01/23/2024 Goal status: INITIAL  PLAN:  PT FREQUENCY: 1-2x/week  PT DURATION: 8 weeks  PLANNED INTERVENTIONS: 97110-Therapeutic exercises, 97530- Therapeutic activity, 97112- Neuromuscular re-education, 97535- Self Care, 16109- Manual therapy, 97016- Vasopneumatic device, Patient/Family education, Dry Needling, Cryotherapy, and Moist heat  PLAN FOR NEXT SESSION: Review day 1 home exercises, particular emphasis on internal rotation stretching, scapular and rotator cuff strengthening.  Consider thumb up the back stretch along with additional scapular and rotator cuff strength progressions.   Joli Neas, PT, MPT 01/23/2024, 6:16 PM

## 2024-01-24 ENCOUNTER — Other Ambulatory Visit (HOSPITAL_COMMUNITY): Payer: Self-pay

## 2024-01-24 DIAGNOSIS — R140 Abdominal distension (gaseous): Secondary | ICD-10-CM | POA: Diagnosis not present

## 2024-01-24 DIAGNOSIS — K581 Irritable bowel syndrome with constipation: Secondary | ICD-10-CM | POA: Diagnosis not present

## 2024-01-24 DIAGNOSIS — K219 Gastro-esophageal reflux disease without esophagitis: Secondary | ICD-10-CM | POA: Diagnosis not present

## 2024-01-24 MED ORDER — DICYCLOMINE HCL 10 MG PO CAPS
10.0000 mg | ORAL_CAPSULE | Freq: Three times a day (TID) | ORAL | 3 refills | Status: AC | PRN
  Filled 2024-01-24: qty 90, 30d supply, fill #0

## 2024-01-24 MED ORDER — PANTOPRAZOLE SODIUM 20 MG PO TBEC
20.0000 mg | DELAYED_RELEASE_TABLET | Freq: Two times a day (BID) | ORAL | 3 refills | Status: AC
  Filled 2024-01-24 – 2024-02-27 (×2): qty 180, 90d supply, fill #0
  Filled 2024-07-03: qty 180, 90d supply, fill #1
  Filled 2024-09-30: qty 180, 90d supply, fill #2
  Filled 2024-09-30: qty 180, 90d supply, fill #0

## 2024-01-28 ENCOUNTER — Other Ambulatory Visit: Payer: Self-pay

## 2024-01-29 ENCOUNTER — Encounter: Payer: Self-pay | Admitting: Rehabilitative and Restorative Service Providers"

## 2024-01-29 ENCOUNTER — Ambulatory Visit: Admitting: Rehabilitative and Restorative Service Providers"

## 2024-01-29 DIAGNOSIS — M25511 Pain in right shoulder: Secondary | ICD-10-CM

## 2024-01-29 DIAGNOSIS — M6281 Muscle weakness (generalized): Secondary | ICD-10-CM | POA: Diagnosis not present

## 2024-01-29 DIAGNOSIS — G8929 Other chronic pain: Secondary | ICD-10-CM

## 2024-01-29 DIAGNOSIS — R600 Localized edema: Secondary | ICD-10-CM

## 2024-01-29 DIAGNOSIS — M25611 Stiffness of right shoulder, not elsewhere classified: Secondary | ICD-10-CM

## 2024-01-29 NOTE — Therapy (Signed)
 OUTPATIENT PHYSICAL THERAPY SHOULDER TREATMENT   Patient Name: Chelsey Hunt MRN: [id number] DOB:Jan 29, 1966, 58 y.o., female Today's Date: 01/29/2024  END OF SESSION:  PT End of Session - 01/29/24 0848     Visit Number 2    Number of Visits 16    Date for PT Re-Evaluation 03/19/24    Authorization Type [place] [employer]    Progress Note Due on Visit 16    PT Start Time 0847    PT Stop Time 0927    PT Time Calculation (min) 40 min    Activity Tolerance Patient tolerated treatment well;No increased pain    Behavior During Therapy WFL for tasks assessed/performed              Past Medical History:  Diagnosis Date   ACID REFLUX DISEASE 01/03/2008   Anemia    ANEMIA, IRON DEFICIENCY 12/23/2008   history   Anxiety    Chronic tension type headache 01/13/2009   migraines   Complication of anesthesia    pt states she just needs a small amount or she will sleep too long   Dysplasia of cervix, low grade (CIN 1)    Fibroid    GERD (gastroesophageal reflux disease)    HEART MURMUR, SYSTOLIC 07/07/2010   will have echo 06/18/2013   HYPOTHYROIDISM, POST-RADIATION 08/12/2009   Irritable bowel syndrome 12/23/2008   Migraine    Ovarian cyst    Ovarian cyst    THYROID  NODULE, RIGHT 04/21/2009   Past Surgical History:  Procedure Laterality Date   ABDOMINAL HYSTERECTOMY     COLONOSCOPY     HYSTEROSCOPY     LAPAROSCOPY     NOVASURE ABLATION     POLYPECTOMY     TUBAL LIGATION     UPPER GI ENDOSCOPY     VAGINAL HYSTERECTOMY N/A 06/24/2013   Procedure: TOTAL HYSTERECTOMY VAGINAL;  Surgeon: Mckinley Spells, MD;  Location: [facility];  Service: Gynecology;  Laterality: N/A;   Patient Active Problem List   Diagnosis Date Noted   Chronic right shoulder pain 11/26/2023   Musculoskeletal neck pain 06/12/2022   Cervical radiculopathy 04/10/2022   Statin intolerance 03/08/2022   Family history of heart disease 11/15/2021   Obstructive sleep apnea 09/07/2021   Prediabetes 09/07/2021    Dyslipidemia 08/14/2021   Positive ANA (antinuclear antibody) 04/20/2021   Finger deformity 04/20/2021   Raynaud phenomenon 09/17/2019   Anxiety 07/30/2019   Chronic idiopathic constipation 01/08/2019   Psychophysiological insomnia 07/07/2018   Seasonal allergic rhinitis due to pollen 08/08/2017   Intractable migraine without aura and without status migrainosus 07/02/2016   Pancreas divisum of native pancreas 12/27/2014   Uterine leiomyoma 09/26/2012   GERD (gastroesophageal reflux disease)    Hypothyroidism 08/12/2009    PCP: Elvira Hammersmith, MD  REFERRING PROVIDER: Jasmine Mesi, PT, MPT  REFERRING DIAG: M25.511 (ICD-10-CM) - Right shoulder pain, unspecified chronicity  THERAPY DIAG:  Muscle weakness (generalized)  Localized edema  Stiffness of right shoulder, not elsewhere classified  Chronic right shoulder pain  Rationale for Evaluation and Treatment: Rehabilitation  ONSET DATE: Chronic, worse over the last year  SUBJECTIVE:  SUBJECTIVE STATEMENT: Chelsey Hunt notes good HEP compliance.  She has questions with her day 1 HEP that we will address today.  Chelsey Hunt notes chronic shoulder pain.  Recent MRI shows small degenerative changes at the biceps anchor.  She is "very right handed" and thinks pushing and navigating heavy doors at work is the main cause.  Hand dominance: Right  PERTINENT HISTORY: Migraines, hypothyroid, hysterectomy, cervical radiculopathy, pre-diabetes  PAIN:  Are you having pain? Yes: NPRS scale: 0-6/10 this week Pain location: Rt shoulder Pain description: Ache Aggravating factors: Lie on the right side, doors at work, Chiropractor, lifting Relieving factors: Tiger balm, ice, heat, rest, muscle relaxer (1/2 of a 5 mg)  PRECAUTIONS: Shoulder  RED  FLAGS: None   WEIGHT BEARING RESTRICTIONS: No  FALLS:  Has patient fallen in last 6 months? 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  DIAGNOSTIC FINDINGS:  See Dr. Eliberto Grosser note  PATIENT SURVEYS:  Patient-Specific Activity Scoring Scheme  "0" represents "unable to perform." "10" represents "able to perform at prior level. 0 1 2 3 4 5 6 7 8 9  10 (Date and Score)   Activity Eval     1.  Throw Corn hole  5/10    2.  Lift greater than 10 pounds 4/10    3.  Exercise 5/10   4.    5.    Score 4.67    Total score = sum of the activity scores/number of activities Minimum detectable change (90%CI) for average score = 2 points Minimum detectable change (90%CI) for single activity score = 3 points     COGNITION: Overall cognitive status: Within functional limits for tasks assessed     SENSATION: Occasional Rt > Lt hand tingling and numbness, no particular pattern noted (median/ulnar/cervical spine)  POSTURE: Mild forward head, internally rotated and protracted shoulders  UPPER EXTREMITY ROM:   Passive ROM Left/Right 01/23/2024   Shoulder flexion 165/160   Shoulder extension    Shoulder abduction    Shoulder horizontal adduction 35/35   Shoulder internal rotation 65/30   Shoulder external rotation 90/90   Elbow flexion    Elbow extension    Wrist flexion    Wrist extension    Wrist ulnar deviation    Wrist radial deviation    Wrist pronation    Wrist supination    (Blank rows = not tested)  UPPER EXTREMITY STRENGTH:  In pounds assessed with hand-held  dynamometer Left/Right 01/23/2024   Shoulder flexion    Shoulder extension    Shoulder abduction    Shoulder adduction    Shoulder internal rotation 30.5/22.7   Shoulder external rotation 18.2/5.3   Middle trapezius    Lower trapezius    Elbow flexion    Elbow extension    Wrist flexion    Wrist extension    Wrist ulnar deviation    Wrist radial deviation    Wrist pronation    Wrist supination    Grip strength (lbs)    (Blank rows = not tested)  TREATMENT DATE:  01/29/2024 Shoulder blade pinches/scapular retraction 10 x 5 seconds Supine IR stretch 15 x 10 seconds Thera-Band external rotation red 2 sets of 10 x 3 seconds Thera-Band internal rotation 10 x 3 seconds  Functional Activities: For reaching at work: Supine arm raises 20 x 3 seconds with 2# Posterior capsule stretch 10 x 10 seconds Thumb up the back stretch (IR) 10 x 10 seconds  16109: Reviewed examination findings, goals of therapeutic interventions and all home exercises   01/23/2024 Supine arm raises/scapular protraction 10 x 3 seconds Shoulder blade pinches/scapular retraction 10 x 5 seconds Supine IR stretch 10 x 10 seconds Thera-Band external rotation red 10 x 3 seconds  60454: Reviewed imaging and shoulder model to discuss mechanisms of impingement, examination findings, goals of therapeutic interventions and day 1 home exercises  PATIENT EDUCATION: Education details: See above Person educated: Patient Education method: Explanation, Demonstration, Tactile cues, Verbal cues, and Handouts Education comprehension: verbalized understanding, returned demonstration, verbal cues required, tactile cues required, and needs further education  HOME EXERCISE PROGRAM: Access Code: [email or web address] URL: [email or web address] Date: 01/29/2024 Prepared by: Terral Ferrari  Exercises - Supine Scapular Protraction in  Flexion with Dumbbells  - 2 x daily - 7 x weekly - 1 sets - 30 reps - 3 seconds hold - Supine Shoulder Internal Rotation Stretch  - 2 x daily - 7 x weekly - 1 sets - 10-20 reps - 10 seconds hold - Standing Scapular Retraction  - 5 x daily - 7 x weekly - 1 sets - 5 reps - 5 second hold - Shoulder External Rotation with Anchored Resistance  - 2 x daily - 7 x weekly - 2 sets - 10 reps - 3 hold - Standing Shoulder Posterior Capsule Stretch  - 2-3 x daily - 7 x weekly - 1 sets - 10 reps - 10 seconds hold - Standing Shoulder Internal Rotation Stretch with Hands Behind Back  - 2-3 x daily - 7 x weekly - 1 sets - 10 reps - 10 seconds hold - Shoulder Internal Rotation with Resistance  - 2 x daily - 7 x weekly - 1 sets - 10 reps - 3 seconds hold  ASSESSMENT:  CLINICAL IMPRESSION: Chelsey Hunt did a good job with her early HEP recall and demonstration.  I progressed capsular flexibility and rotator cuff strength challenges to address impairments noted at evaluation.  Her prognosis remains good to meet long-term goals.  Patient is a 58 y.o. female who was seen today for physical therapy evaluation and treatment for M25.511 (ICD-10-CM) - Right shoulder pain, unspecified chronicity.  Chelsey Hunt has a greater than 1 year history of right shoulder pain with symptoms worsening over the last several months.  She has very limited right shoulder internal rotation active range of motion and very weak right shoulder external rotation.  These were addressed with her day 1 home exercise program along with some education.  Her prognosis to meet the below listed goals is good with the recommended plan of care.  OBJECTIVE IMPAIRMENTS: decreased activity tolerance, decreased endurance, decreased knowledge of condition, decreased ROM, decreased strength, decreased safety awareness, increased edema, impaired perceived functional ability, impaired flexibility, impaired UE functional use, and pain.   ACTIVITY LIMITATIONS: carrying,  lifting, sleeping, and reach over head  PARTICIPATION LIMITATIONS: driving, community activity, and occupation  PERSONAL FACTORS: Migraines, hypothyroid, hysterectomy, cervical radiculopathy, pre-diabetes are also affecting patient's functional outcome.   REHAB POTENTIAL: Good  CLINICAL DECISION MAKING: Stable/uncomplicated  EVALUATION COMPLEXITY: Low   GOALS: Goals  reviewed with patient? Yes  SHORT TERM GOALS: Target date: 02/20/2024  Chelsey Hunt will be independent with her day 1 home exercise program Baseline: Started 01/23/2024 Goal status: Met 01/29/2024  2.  Improve right shoulder internal rotation active range of motion to at least 50 degrees Baseline: 30 degrees Goal status: On Going 01/29/2024  3.  Improve right shoulder external rotation strength to at least 10 pounds Baseline: 5.3 pounds Goal status: INITIAL   LONG TERM GOALS: Target date: 03/19/2024  Improve Chelsey Hunt's score on the patient's specific functional scale to at least 7/10 Baseline: 4.67/10 Goal status: INITIAL  2.  Chelsey Hunt will report right shoulder pain no greater than 3/10 on the numeric pain rating scale Baseline: Can be 6/10 Goal status: INITIAL  3.  Improve bilateral shoulder active range of motion for flexion to 170, horizontal adduction to 40 and internal rotation to at least 60 degrees Baseline: 165/160; 35/35 and 65/30 respectively Goal status: INITIAL  4.  Improve right shoulder strength for internal rotation to at least 30 pounds and external rotation to at least 20 pounds Baseline: 22.7 and 5.3 respectively Goal status: INITIAL  5.  Chelsey Hunt will be independent with her long-term home maintenance exercise program at discharge Baseline: Started 01/23/2024 Goal status: INITIAL  PLAN:  PT FREQUENCY: 1-2x/week  PT DURATION: 8 weeks  PLANNED INTERVENTIONS: 97110-Therapeutic exercises, 97530- Therapeutic activity, 97112- Neuromuscular re-education, 97535- Self Care, 16109- Manual  therapy, 97016- Vasopneumatic device, Patient/Family education, Dry Needling, Cryotherapy, and Moist heat  PLAN FOR NEXT SESSION: Review current home exercises, particular emphasis on internal rotation stretching, scapular and rotator cuff strengthening.  Consider additional scapular and rotator cuff strength progressions as appropriate.  Be conservative with HEP additions.   Joli Neas, PT, MPT 01/29/2024, 5:23 PM 2

## 2024-02-05 ENCOUNTER — Other Ambulatory Visit (HOSPITAL_COMMUNITY): Payer: Self-pay

## 2024-02-14 ENCOUNTER — Ambulatory Visit: Admitting: Rehabilitative and Restorative Service Providers"

## 2024-02-14 ENCOUNTER — Encounter: Payer: Self-pay | Admitting: Rehabilitative and Restorative Service Providers"

## 2024-02-14 DIAGNOSIS — M6281 Muscle weakness (generalized): Secondary | ICD-10-CM | POA: Diagnosis not present

## 2024-02-14 DIAGNOSIS — M25611 Stiffness of right shoulder, not elsewhere classified: Secondary | ICD-10-CM | POA: Diagnosis not present

## 2024-02-14 DIAGNOSIS — M25511 Pain in right shoulder: Secondary | ICD-10-CM | POA: Diagnosis not present

## 2024-02-14 DIAGNOSIS — R600 Localized edema: Secondary | ICD-10-CM

## 2024-02-14 DIAGNOSIS — G8929 Other chronic pain: Secondary | ICD-10-CM | POA: Diagnosis not present

## 2024-02-14 NOTE — Therapy (Signed)
 OUTPATIENT PHYSICAL THERAPY SHOULDER TREATMENT   Patient Name: Chelsey Hunt MRN: [id number] DOB:09/15/1965, 58 y.o., female Today's Date: 02/14/2024  END OF SESSION:  PT End of Session - 02/14/24 1020     Visit Number 3    Number of Visits 16    Date for PT Re-Evaluation 03/19/24    Authorization Type [place] [employer]    Progress Note Due on Visit 16    PT Start Time 1020    PT Stop Time 1100    PT Time Calculation (min) 40 min    Activity Tolerance Patient tolerated treatment well;No increased pain    Behavior During Therapy WFL for tasks assessed/performed               Past Medical History:  Diagnosis Date   ACID REFLUX DISEASE 01/03/2008   Anemia    ANEMIA, IRON DEFICIENCY 12/23/2008   history   Anxiety    Chronic tension type headache 01/13/2009   migraines   Complication of anesthesia    pt states she just needs a small amount or she will sleep too long   Dysplasia of cervix, low grade (CIN 1)    Fibroid    GERD (gastroesophageal reflux disease)    HEART MURMUR, SYSTOLIC 07/07/2010   will have echo 06/18/2013   HYPOTHYROIDISM, POST-RADIATION 08/12/2009   Irritable bowel syndrome 12/23/2008   Migraine    Ovarian cyst    Ovarian cyst    THYROID  NODULE, RIGHT 04/21/2009   Past Surgical History:  Procedure Laterality Date   ABDOMINAL HYSTERECTOMY     COLONOSCOPY     HYSTEROSCOPY     LAPAROSCOPY     NOVASURE ABLATION     POLYPECTOMY     TUBAL LIGATION     UPPER GI ENDOSCOPY     VAGINAL HYSTERECTOMY N/A 06/24/2013   Procedure: TOTAL HYSTERECTOMY VAGINAL;  Surgeon: Mckinley Spells, MD;  Location: [facility];  Service: Gynecology;  Laterality: N/A;   Patient Active Problem List   Diagnosis Date Noted   Chronic right shoulder pain 11/26/2023   Musculoskeletal neck pain 06/12/2022   Cervical radiculopathy 04/10/2022   Statin intolerance 03/08/2022   Family history of heart disease 11/15/2021   Obstructive sleep apnea 09/07/2021   Prediabetes 09/07/2021    Dyslipidemia 08/14/2021   Positive ANA (antinuclear antibody) 04/20/2021   Finger deformity 04/20/2021   Raynaud phenomenon 09/17/2019   Anxiety 07/30/2019   Chronic idiopathic constipation 01/08/2019   Psychophysiological insomnia 07/07/2018   Seasonal allergic rhinitis due to pollen 08/08/2017   Intractable migraine without aura and without status migrainosus 07/02/2016   Pancreas divisum of native pancreas 12/27/2014   Uterine leiomyoma 09/26/2012   GERD (gastroesophageal reflux disease)    Hypothyroidism 08/12/2009    PCP: Elvira Hammersmith, MD  REFERRING PROVIDER: Jasmine Mesi, PT, MPT  REFERRING DIAG: M25.511 (ICD-10-CM) - Right shoulder pain, unspecified chronicity  THERAPY DIAG:  Muscle weakness (generalized)  Localized edema  Stiffness of right shoulder, not elsewhere classified  Chronic right shoulder pain  Rationale for Evaluation and Treatment: Rehabilitation  ONSET DATE: Chronic, worse over the last year  SUBJECTIVE:  SUBJECTIVE STATEMENT: Chelsey Hunt notes improved AROM, particularly reaching behind her back.  She has been good with her home exercise, client's overall, although she admits migraines at this week have made it more difficult to get in her home exercises.  Chelsey Hunt notes chronic shoulder pain.  Recent MRI shows small degenerative changes at the biceps anchor.  She is "very right handed" and thinks pushing and navigating heavy doors at work is the main cause.  Hand dominance: Right  PERTINENT HISTORY: Migraines, hypothyroid, hysterectomy, cervical radiculopathy, pre-diabetes  PAIN:  Are you having pain? Yes: NPRS scale: 0-6/10 this week Pain location: Rt shoulder Pain description: Ache Aggravating factors: Lie on the right side, doors at work, Community education officer, lifting Relieving factors: Tiger balm, ice, heat, rest, muscle relaxer (1/2 of a 5 mg)  PRECAUTIONS: Shoulder  RED FLAGS: None   WEIGHT BEARING RESTRICTIONS: No  FALLS:  Has patient fallen in last 6 months? 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  DIAGNOSTIC FINDINGS:  See Dr. Eliberto Grosser note  PATIENT SURVEYS:  Patient-Specific Activity Scoring Scheme  "0" represents "unable to perform." "10" represents "able to perform at prior level. 0 1 2 3 4 5 6 7 8 9  10 (Date and Score)   Activity Eval   02/14/2024  1.  Throw Corn hole  5/10 5/10   2.  Lift greater than 10 pounds 4/10  4/10  3.  Exercise 5/10 5/10  4.    5.    Score 4.67 4.67   Total score = sum of the activity scores/number of activities Minimum detectable change (90%CI) for average score = 2 points Minimum detectable change (90%CI) for single activity score = 3 points     COGNITION: Overall cognitive status: Within functional limits for tasks assessed     SENSATION: Occasional Rt > Lt hand tingling and numbness, no particular pattern noted (median/ulnar/cervical spine)  POSTURE: Mild forward head, internally rotated and protracted shoulders  UPPER EXTREMITY ROM:   Passive ROM Left/Right 01/23/2024 Right 02/14/2024  Shoulder flexion 165/160 160  Shoulder extension    Shoulder abduction    Shoulder horizontal adduction 35/35 45  Shoulder internal rotation 65/30 55  Shoulder external rotation 90/90 100  Elbow flexion    Elbow extension    Wrist flexion    Wrist extension    Wrist ulnar  deviation    Wrist radial deviation    Wrist pronation    Wrist supination    (Blank rows = not tested)  UPPER EXTREMITY STRENGTH:  In pounds assessed with hand-held dynamometer Left/Right 01/23/2024 Right 02/14/2024  Shoulder flexion    Shoulder extension    Shoulder abduction    Shoulder adduction    Shoulder internal rotation 30.5/22.7 28.1  Shoulder external rotation 18.2/5.3 9.9  Middle trapezius    Lower trapezius    Elbow flexion    Elbow extension    Wrist flexion    Wrist extension    Wrist ulnar deviation    Wrist radial deviation    Wrist pronation    Wrist supination    Grip strength (lbs)    (  Blank rows = not tested)                                                                                             TREATMENT DATE:  02/14/2024 Shoulder blade pinches/scapular retraction 10 x 5 seconds Supine IR stretch 20 x 10 seconds Thera-Band external rotation red 2 sets of 10 x 3 seconds Thera-Band internal rotation 10 x 3 seconds  Functional Activities: For reaching at work: Supine arm raises 20 x 3 seconds with 3# Posterior capsule stretch 5 x 10 seconds Thumb up the back stretch (IR) 10 x 10 seconds Supine shoulder flexion (protract 1st, palm facing in) 10 x 10 seconds (overhead function)  [id number]: Reviewed objective findings, minor corrections and changes with her HEP   01/29/2024 Shoulder blade pinches/scapular retraction 10 x 5 seconds Supine IR stretch 15 x 10 seconds Thera-Band external rotation red 2 sets of 10 x 3 seconds Thera-Band internal rotation 10 x 3 seconds  Functional Activities: For reaching at work: Supine arm raises 20 x 3 seconds with 2# Posterior capsule stretch 10 x 10 seconds Thumb up the back stretch (IR) 10 x 10 seconds  [id number]: Reviewed examination findings, goals of therapeutic interventions and all home exercises   01/23/2024 Supine arm raises/scapular protraction 10 x 3 seconds Shoulder blade pinches/scapular retraction 10  x 5 seconds Supine IR stretch 10 x 10 seconds Thera-Band external rotation red 10 x 3 seconds  [id number]: Reviewed imaging and shoulder model to discuss mechanisms of impingement, examination findings, goals of therapeutic interventions and day 1 home exercises  PATIENT EDUCATION: Education details: See above Person educated: Patient Education method: Explanation, Demonstration, Tactile cues, Verbal cues, and Handouts Education comprehension: verbalized understanding, returned demonstration, verbal cues required, tactile cues required, and needs further education  HOME EXERCISE PROGRAM: Access Code: [email or web address] URL: [email or web address] Date: 02/14/2024 Prepared by: Terral Ferrari  Exercises - Supine Scapular Protraction in Flexion with Dumbbells  - 1-2 x daily - 7 x weekly - 1 sets - 30 reps - 3 seconds hold - Supine Shoulder Internal Rotation Stretch  - 1-2 x daily - 7 x weekly - 1 sets - 10-20 reps - 10 seconds hold - Standing Scapular Retraction  - 5 x daily - 7 x weekly - 1 sets - 5 reps - 5 second hold - Shoulder External Rotation with Anchored Resistance  - 1-2 x daily - 7 x weekly - 2 sets - 10 reps - 3 hold - Standing Shoulder Posterior Capsule Stretch  - 1 x daily - 7 x weekly - 1 sets - 10 reps - 10 seconds hold - Standing Shoulder Internal Rotation Stretch with Hands Behind Back  - 2 x daily - 7 x weekly - 1 sets - 10 reps - 10 seconds hold - Shoulder Internal Rotation with Resistance  - 1-2 x daily - 7 x weekly - 1 sets - 10 reps - 3 seconds hold - Supine Shoulder Flexion Extension Full Range AROM  - 1 x daily - 7 x weekly - 1 sets - 10 reps - 10 seconds hold  Access Code: [email or web address] URL: [email or web address] 10 x 5 seconds Supine IR stretch 15 x 10 seconds Thera-Band external rotation red 2 sets of 10 x 3 seconds Thera-Band internal rotation 10 x 3 seconds  Functional Activities: For reaching at work: Supine arm raises 20 x 3  seconds with 2# Posterior capsule stretch 10 x 10 seconds Thumb up the back stretch (IR) 10 x 10 seconds  [id number]: Reviewed examination findings, goals of therapeutic interventions and all home [email or web address] Date: 01/29/2024 Prepared by: Terral Ferrari  Exercises - Supine Scapular Protraction in Flexion with Dumbbells  - 2 x daily - 7 x weekly - 1 sets - 30 reps - 3 seconds hold - Supine Shoulder Internal Rotation Stretch  - 2 x daily - 7 x weekly - 1 sets - 10-20 reps - 10 seconds hold - Standing Scapular Retraction  - 5 x daily - 7 x weekly - 1 sets - 5 reps - 5 second hold - Shoulder External Rotation with Anchored Resistance  - 2 x daily - 7 x weekly - 2 sets - 10 reps - 3 hold - Standing Shoulder Posterior Capsule Stretch  - 2-3 x daily - 7 x weekly - 1 sets - 10 reps - 10 seconds hold - Standing Shoulder Internal Rotation Stretch with Hands Behind Back  - 2-3 x daily - 7 x weekly - 1 sets - 10 reps - 10 seconds hold - Shoulder Internal Rotation with Resistance  - 2 x daily - 7 x weekly - 1 sets - 10 reps - 3 seconds hold  ASSESSMENT:  CLINICAL IMPRESSION: Chelsey Hunt has significantly improved active range of motion and improved strength as compared to evaluation.  As of today, she is not noticing major functional progress, although this may be affected by her migraines this week.  With continued work on scapular and rotator cuff strength, I anticipate she will meet long-term goals within the current plan of care.  Patient is a 58 y.o. female who was seen today for physical therapy evaluation and treatment for M25.511 (ICD-10-CM) - Right shoulder pain, unspecified chronicity.  Chelsey Hunt has a greater than 1 year history of right shoulder pain with symptoms worsening over the last several months.  She has very limited right shoulder internal rotation active range of motion and very weak right shoulder external rotation.  These were addressed with her day 1 home exercise  program along with some education.  Her prognosis to meet the below listed goals is good with the recommended plan of care.  OBJECTIVE IMPAIRMENTS: decreased activity tolerance, decreased endurance, decreased knowledge of condition, decreased ROM, decreased strength, decreased safety awareness, increased edema, impaired perceived functional ability, impaired flexibility, impaired UE functional use, and pain.   ACTIVITY LIMITATIONS: carrying, lifting, sleeping, and reach over head  PARTICIPATION LIMITATIONS: driving, community activity, and occupation  PERSONAL FACTORS: Migraines, hypothyroid, hysterectomy, cervical radiculopathy, pre-diabetes are also affecting patient's functional outcome.   REHAB POTENTIAL: Good  CLINICAL DECISION MAKING: Stable/uncomplicated  EVALUATION COMPLEXITY: Low   GOALS: Goals reviewed with patient? Yes  SHORT TERM GOALS: Target date: 02/20/2024   Chelsey Hunt will be independent with her day 1 home exercise program Baseline: Started 01/23/2024 Goal status: Met 01/29/2024  2.  Improve right shoulder internal rotation active range of motion to at least 50 degrees Baseline: 30 degrees Goal status: Met 02/14/2024   3.  Improve right shoulder external rotation strength to at least 10 pounds Baseline: 5.3 pounds Goal status: Ongoing 02/14/2024   LONG TERM GOALS: Target date: 03/19/2024  Improve  Chelsey Hunt's score on the patient's specific functional scale to at least 7/10 Baseline: 4.67/10 Goal status: Ongoing 02/14/2024  2.  Chelsey Hunt will report right shoulder pain no greater than 3/10 on the numeric pain rating scale Baseline: Can be 6/10 Goal status: Ongoing 02/14/2024  3.  Improve bilateral shoulder active range of motion for flexion to 170, horizontal adduction to 40 and internal rotation to at least 60 degrees Baseline: 165/160; 35/35 and 65/30 respectively Goal status: Partially met 02/14/2024  4.  Improve right shoulder strength for internal rotation to  at least 30 pounds and external rotation to at least 20 pounds Baseline: 22.7 and 5.3 respectively Goal status: Ongoing 02/14/2024  5.  Chelsey Hunt will be independent with her long-term home maintenance exercise program at discharge Baseline: Started 01/23/2024 Goal status: INITIAL  PLAN:  PT FREQUENCY: 1-2x/week  PT DURATION: 8 weeks  PLANNED INTERVENTIONS: 97110-Therapeutic exercises, 97530- Therapeutic activity, 97112- Neuromuscular re-education, 97535- Self Care, 16109- Manual therapy, [id number]- Vasopneumatic device, Patient/Family education, Dry Needling, Cryotherapy, and Moist heat  PLAN FOR NEXT SESSION: Emphasis on scapular and rotator cuff strengthening.  Consider additional scapular and rotator cuff strength progressions to her HEP as appropriate.  For compliance, be conservative with HEP additions.   Joli Neas, PT, MPT 02/14/2024, 1:29 PM 2

## 2024-02-17 ENCOUNTER — Other Ambulatory Visit (HOSPITAL_COMMUNITY): Payer: Self-pay

## 2024-02-17 ENCOUNTER — Ambulatory Visit: Admitting: Emergency Medicine

## 2024-02-17 ENCOUNTER — Encounter: Payer: Self-pay | Admitting: Emergency Medicine

## 2024-02-17 VITALS — BP 122/82 | HR 86 | Temp 98.3°F | Ht 62.0 in | Wt 150.0 lb

## 2024-02-17 DIAGNOSIS — M79605 Pain in left leg: Secondary | ICD-10-CM

## 2024-02-17 DIAGNOSIS — E663 Overweight: Secondary | ICD-10-CM | POA: Diagnosis not present

## 2024-02-17 MED ORDER — TIRZEPATIDE-WEIGHT MANAGEMENT 2.5 MG/0.5ML ~~LOC~~ SOAJ
2.5000 mg | SUBCUTANEOUS | 1 refills | Status: DC
  Filled 2024-02-17 – 2024-02-27 (×2): qty 2, 28d supply, fill #0

## 2024-02-17 NOTE — Assessment & Plan Note (Signed)
 Musculoskeletal pain related to activities of daily living Clinically stable.  No red flag signs or symptoms Unremarkable physical findings.  Normal leg examination. Pain management discussed No signs of DVT.

## 2024-02-17 NOTE — Progress Notes (Signed)
 Zakyla Tonche 58 y.o.   Chief Complaint  Patient presents with   Leg Pain    Patient states her Left leg has been having pains at the top of shin area and upper thigh to her groin for about a week now. She states hurts when standing and walking, does having some tingling. She is only taking tylenol  now to help it only helps for a little. She also mentions wanting to go over weight     HISTORY OF PRESENT ILLNESS: This is a 58 y.o. female complaining of pain to left leg on and off for the last week Also complaining of weight gain.  Interested in weight loss medication No other complaint or medical concerns today.  Leg Pain      Prior to Admission medications   Medication Sig Start Date End Date Taking? Authorizing Provider  acetaminophen  (TYLENOL ) 325 MG tablet Take 650 mg by mouth every 6 (six) hours as needed for mild pain.   Yes [provider]  Cholecalciferol (D3 2000 PO) Take by mouth.   Yes [provider]  cyclobenzaprine  (FLEXERIL ) 5 MG tablet Take 1 tablet (5 mg total) by mouth 3 (three) times daily as needed for muscle spasms. 12/23/23  Yes Patel, Donika K, DO  dicyclomine  (BENTYL ) 10 MG capsule Take 1 capsule (10 mg total) by mouth 3 (three) times daily as needed. 01/24/24  Yes   ezetimibe  (ZETIA ) 10 MG tablet Take 1 tablet (10 mg total) by mouth every evening. 05/14/23  Yes Jackye Dever, Isidro Margo, MD  ibuprofen  (ADVIL ,MOTRIN ) 200 MG tablet Take 200 mg by mouth every 8 (eight) hours as needed.   Yes [provider]  levothyroxine  (SYNTHROID ) 75 MCG tablet Take 1 tablet (75 mcg total) by mouth daily. 05/14/23  Yes Hipolito Martinezlopez, Isidro Margo, MD  magnesium 30 MG tablet Take 30 mg by mouth daily.   Yes [provider]  pantoprazole  (PROTONIX ) 20 MG tablet Take 1 tablet (20 mg total) by mouth before breakfast and before evening meal. 01/24/24  Yes   promethazine  (PHENERGAN ) 25 MG tablet Take 1 tablet (25 mg total) by mouth every 6 (six) hours as needed  for nausea or vomiting. 10/29/22  Yes Jaffe, Adam R, DO  Rimegepant Sulfate  (NURTEC) 75 MG TBDP Take 1 tablet (75 mg total) by mouth every other day. 10/09/23  Yes Jaffe, Adam R, DO  Ubrogepant  (UBRELVY ) 100 MG TABS Take 1 tablet (100 mg) by mouth as needed. May repeat dose in 2 hours.  Maximum 2 tablets in 24 hours. 10/09/23  Yes Jaffe, Adam R, DO  valACYclovir  (VALTREX ) 1000 MG tablet Take 1 tablet (1,000 mg total) by mouth every 12 (twelve) hours for 5 days. 10/01/23  Yes   dicyclomine  (BENTYL ) 10 MG capsule Take 1 capsule (10 mg total) by mouth 3 times daily. as needed Patient not taking: Reported on 02/17/2024 11/07/22     pantoprazole  (PROTONIX ) 20 MG tablet Take 1 tablet (20 mg total) by mouth 2 (two) times daily before a meal. Take 30-45 minutes prior to breakfast and dinner. Patient not taking: Reported on 02/17/2024 04/08/23       Allergies  Allergen Reactions   Imitrex  [Sumatriptan ] Palpitations   Reglan  [Metoclopramide ] Palpitations   Rosuvastatin  Other (See Comments)    Arthralgia and myalgia   Propoxyphene N-Acetaminophen     Nabumetone Rash    REACTION: GI upset   Topiramate Rash    REACTION: rash    Patient Active Problem List   Diagnosis Date Noted  Chronic right shoulder pain 11/26/2023   Musculoskeletal neck pain 06/12/2022   Cervical radiculopathy 04/10/2022   Statin intolerance 03/08/2022   Family history of heart disease 11/15/2021   Obstructive sleep apnea 09/07/2021   Prediabetes 09/07/2021   Dyslipidemia 08/14/2021   Positive ANA (antinuclear antibody) 04/20/2021   Finger deformity 04/20/2021   Raynaud phenomenon 09/17/2019   Anxiety 07/30/2019   Chronic idiopathic constipation 01/08/2019   Psychophysiological insomnia 07/07/2018   Seasonal allergic rhinitis due to pollen 08/08/2017   Intractable migraine without aura and without status migrainosus 07/02/2016   Pancreas divisum of native pancreas 12/27/2014   Uterine leiomyoma 09/26/2012   GERD  (gastroesophageal reflux disease)    Hypothyroidism 08/12/2009    Past Medical History:  Diagnosis Date   ACID REFLUX DISEASE 01/03/2008   Anemia    ANEMIA, IRON DEFICIENCY 12/23/2008   history   Anxiety    Chronic tension type headache 01/13/2009   migraines   Complication of anesthesia    pt states she just needs a small amount or she will sleep too long   Dysplasia of cervix, low grade (CIN 1)    Fibroid    GERD (gastroesophageal reflux disease)    HEART MURMUR, SYSTOLIC 07/07/2010   will have echo 06/18/2013   HYPOTHYROIDISM, POST-RADIATION 08/12/2009   Irritable bowel syndrome 12/23/2008   Migraine    Ovarian cyst    Ovarian cyst    THYROID  NODULE, RIGHT 04/21/2009    Past Surgical History:  Procedure Laterality Date   ABDOMINAL HYSTERECTOMY     COLONOSCOPY     HYSTEROSCOPY     LAPAROSCOPY     NOVASURE ABLATION     POLYPECTOMY     TUBAL LIGATION     UPPER GI ENDOSCOPY     VAGINAL HYSTERECTOMY N/A 06/24/2013   Procedure: TOTAL HYSTERECTOMY VAGINAL;  Surgeon: Mckinley Spells, MD;  Location: [facility];  Service: Gynecology;  Laterality: N/A;    Social History   Socioeconomic History   Marital status: Married    Spouse name: Myrtie Atkinson   Number of children: 3   Years of education: Not on file   Highest education level: Some college, no degree  Occupational History   Occupation: WOMENS    Employer: [employer]  Tobacco Use   Smoking status: Never    Passive exposure: Past   Smokeless tobacco: Never  Vaping Use   Vaping status: Never Used  Substance and Sexual Activity   Alcohol use: No   Drug use: No   Sexual activity: Yes    Birth control/protection: Surgical    Comment: BTL  Other Topics Concern   Not on file  Social History Narrative   Regular exercise-yes      Patient is right-handed.She lives with her husband in a 1 story home. She has recently been avoiding caffeine. Walks.   Right handed   Does work   Social Drivers of Corporate investment banker  Strain: Low Risk  (11/25/2023)   Overall Financial Resource Strain (CARDIA)    Difficulty of Paying Living Expenses: Not very hard  Food Insecurity: No Food Insecurity (11/25/2023)   Hunger Vital Sign    Worried About Running Out of Food in the Last Year: Never true    Ran Out of Food in the Last Year: Never true  Transportation Needs: No Transportation Needs (11/25/2023)   PRAPARE - Administrator, Civil Service (Medical): No    Lack of Transportation (Non-Medical): No  Physical Activity:  Insufficiently Active (11/25/2023)   Exercise Vital Sign    Days of Exercise per Week: 1 day    Minutes of Exercise per Session: 20 min  Stress: No Stress Concern Present (11/25/2023)   [employer] of Occupational Health - Occupational Stress Questionnaire    Feeling of Stress : Only a little  Social Connections: Socially Integrated (11/25/2023)   Social Connection and Isolation Panel [NHANES]    Frequency of Communication with Friends and Family: More than three times a week    Frequency of Social Gatherings with Friends and Family: Once a week    Attends Religious Services: 1 to 4 times per year    Active Member of [employer] or Organizations: Yes    Attends Banker Meetings: 1 to 4 times per year    Marital Status: Married  Catering manager Violence: Not on file    Family History  Problem Relation Age of Onset   Heart disease Mother    Thyroid  disease Mother    Heart attack Mother    Cancer Father    Heart disease Father    Heart attack Father    Throat cancer Father    Lung cancer Father    Alcoholism Father    Graves' disease Sister    Asthma Sister    Hepatitis Sister    Stomach cancer Sister    CVA Brother    Alzheimer's disease Brother    Goiter Brother    Hypertension Brother    Hypertension Brother    Alcoholism Brother    Hyperlipidemia Maternal Aunt    Hypertension Paternal Grandfather    Healthy Daughter    GER disease Son    GER disease Son     Goiter Other        Siblings 2   Depression Neg Hx    Alcohol abuse Neg Hx    Drug abuse Neg Hx    Early death Neg Hx    Hearing loss Neg Hx    Kidney disease Neg Hx    Stroke Neg Hx      ROS  Vitals:   02/17/24 1528  BP: 122/82  Pulse: 86  Temp: 98.3 F (36.8 C)  SpO2: 97%    Physical Exam Vitals reviewed.  Constitutional:      Appearance: Normal appearance.  HENT:     Head: Normocephalic.  Eyes:     Extraocular Movements: Extraocular movements intact.  Cardiovascular:     Rate and Rhythm: Normal rate.  Pulmonary:     Effort: Pulmonary effort is normal.  Musculoskeletal:     Comments: Left lower extremity: No swelling or tenderness.  No signs of DVT.  Good peripheral pulses.  Neurovascularly intact.  Full range of motion.  No abnormal findings.  Skin:    General: Skin is warm and dry.  Neurological:     Mental Status: She is alert and oriented to person, place, and time.  Psychiatric:        Mood and Affect: Mood normal.        Behavior: Behavior normal.      ASSESSMENT & PLAN: A total of 34 minutes was spent with the patient and counseling/coordination of care regarding preparing for this visit, review of most recent office visit notes, review of any chronic medical condition under management, review of all medications, education on nutrition, weight management options including use of GLP-1 agonists, differential diagnosis of leg pain, pain management, prognosis, documentation.  Problem List Items Addressed This Visit  Other   Left leg pain - Primary   Musculoskeletal pain related to activities of daily living Clinically stable.  No red flag signs or symptoms Unremarkable physical findings.  Normal leg examination. Pain management discussed No signs of DVT.       Overweight   Diet and nutrition discussed Advised to decrease amount of daily carbohydrate intake and daily calories and increase amount of plant-based protein in her diet Benefits  of exercise discussed Will benefit from GLP-1 agonist.  Recommend weekly Zepbound New prescription sent to pharmacy of record today      Relevant Medications   tirzepatide (ZEPBOUND) 2.5 MG/0.5ML Pen   Patient Instructions  Healthy Eating, Adult Healthy eating may help you get and keep a healthy body weight, reduce the risk of chronic disease, and live a long and productive life. It is important to follow a healthy eating pattern. Your nutritional and calorie needs should be met mainly by different nutrient-rich foods. What are tips for following this plan? Reading food labels Read labels and choose the following: Reduced or low sodium products. Juices with 100% fruit juice. Foods with low saturated fats (<3 g per serving) and high polyunsaturated and monounsaturated fats. Foods with whole grains, such as whole wheat, cracked wheat, brown rice, and wild rice. Whole grains that are fortified with folic acid . This is recommended for females who are pregnant or who want to become pregnant. Read labels and do not eat or drink the following: Foods or drinks with added sugars. These include foods that contain brown sugar, corn sweetener, corn syrup, dextrose , fructose, glucose, high-fructose corn syrup, honey, invert sugar, lactose, malt syrup, maltose, molasses, raw sugar, sucrose, trehalose, or turbinado sugar. Limit your intake of added sugars to less than 10% of your total daily calories. Do not eat more than the following amounts of added sugar per day: 6 teaspoons (25 g) for females. 9 teaspoons (38 g) for males. Foods that contain processed or refined starches and grains. Refined grain products, such as white flour, degermed cornmeal, white bread, and white rice. Shopping Choose nutrient-rich snacks, such as vegetables, whole fruits, and nuts. Avoid high-calorie and high-sugar snacks, such as potato chips, fruit snacks, and candy. Use oil-based dressings and spreads on foods instead of  solid fats such as butter, margarine, sour cream, or cream cheese. Limit pre-made sauces, mixes, and "instant" products such as flavored rice, instant noodles, and ready-made pasta. Try more plant-protein sources, such as tofu, tempeh, black beans, edamame, lentils, nuts, and seeds. Explore eating plans such as the Mediterranean diet or vegetarian diet. Try heart-healthy dips made with beans and healthy fats like hummus and guacamole. Vegetables go great with these. Cooking Use oil to saut or stir-fry foods instead of solid fats such as butter, margarine, or lard. Try baking, boiling, grilling, or broiling instead of frying. Remove the fatty part of meats before cooking. Steam vegetables in water or broth. Meal planning  At meals, imagine dividing your plate into fourths: One-half of your plate is fruits and vegetables. One-fourth of your plate is whole grains. One-fourth of your plate is protein, especially lean meats, poultry, eggs, tofu, beans, or nuts. Include low-fat dairy as part of your daily diet. Lifestyle Choose healthy options in all settings, including home, work, school, restaurants, or stores. Prepare your food safely: Wash your hands after handling raw meats. Where you prepare food, keep surfaces clean by regularly washing with hot, soapy water. Keep raw meats separate from ready-to-eat foods, such as fruits and  vegetables. Cook seafood, meat, poultry, and eggs to the recommended temperature. Get a food thermometer. Store foods at safe temperatures. In general: Keep cold foods at 84F (4.4C) or below. Keep hot foods at 184F (60C) or above. Keep your freezer at [facility] (-17.8C) or below. Foods are not safe to eat if they have been between the temperatures of 40-184F (4.4-60C) for more than 2 hours. What foods should I eat? Fruits Aim to eat 1-2 cups of fresh, canned (in natural juice), or frozen fruits each day. One cup of fruit equals 1 small apple, 1 large banana,  8 large strawberries, 1 cup (237 g) canned fruit,  cup (82 g) dried fruit, or 1 cup (240 mL) 100% juice. Vegetables Aim to eat 2-4 cups of fresh and frozen vegetables each day, including different varieties and colors. One cup of vegetables equals 1 cup (91 g) broccoli or cauliflower florets, 2 medium carrots, 2 cups (150 g) raw, leafy greens, 1 large tomato, 1 large bell pepper, 1 large sweet potato, or 1 medium white potato. Grains Aim to eat 5-10 ounce-equivalents of whole grains each day. Examples of 1 ounce-equivalent of grains include 1 slice of bread, 1 cup (40 g) ready-to-eat cereal, 3 cups (24 g) popcorn, or  cup (93 g) cooked rice. Meats and other proteins Try to eat 5-7 ounce-equivalents of protein each day. Examples of 1 ounce-equivalent of protein include 1 egg,  oz nuts (12 almonds, 24 pistachios, or 7 walnut halves), 1/4 cup (90 g) cooked beans, 6 tablespoons (90 g) hummus or 1 tablespoon (16 g) peanut butter. A cut of meat or fish that is the size of a deck of cards is about 3-4 ounce-equivalents (85 g). Of the protein you eat each week, try to have at least 8 sounce (227 g) of seafood. This is about 2 servings per week. This includes salmon, trout, herring, sardines, and anchovies. Dairy Aim to eat 3 cup-equivalents of fat-free or low-fat dairy each day. Examples of 1 cup-equivalent of dairy include 1 cup (240 mL) milk, 8 ounces (250 g) yogurt, 1 ounces (44 g) natural cheese, or 1 cup (240 mL) fortified soy milk. Fats and oils Aim for about 5 teaspoons (21 g) of fats and oils per day. Choose monounsaturated fats, such as canola and olive oils, mayonnaise made with olive oil or avocado oil, avocados, peanut butter, and most nuts, or polyunsaturated fats, such as sunflower, corn, and soybean oils, walnuts, pine nuts, sesame seeds, sunflower seeds, and flaxseed. Beverages Aim for 6 eight-ounce glasses of water per day. Limit coffee to 3-5 eight-ounce cups per day. Limit caffeinated  beverages that have added calories, such as soda and energy drinks. If you drink alcohol: Limit how much you have to: 0-1 drink a day if you are female. 0-2 drinks a day if you are female. Know how much alcohol is in your drink. In the [place] one drink is one 12 oz bottle of beer (355 mL), one 5 oz glass of wine (148 mL), or one 1 oz glass of hard liquor (44 mL). Seasoning and other foods Try not to add too much salt to your food. Try using herbs and spices instead of salt. Try not to add sugar to food. This information is based on [place] nutrition guidelines. To learn more, visit DisposableNylon.be. Exact amounts may vary. You may need different amounts. This information is not intended to replace advice given to you by your health care provider. Make sure you discuss any questions you have  with your health care provider. Document Revised: 05/28/2022 Document Reviewed: 05/28/2022 Elsevier Patient Education  2024 Elsevier Inc.   Maryagnes Small, MD [facility]

## 2024-02-17 NOTE — Assessment & Plan Note (Signed)
 Diet and nutrition discussed Advised to decrease amount of daily carbohydrate intake and daily calories and increase amount of plant-based protein in her diet Benefits of exercise discussed Will benefit from GLP-1 agonist.  Recommend weekly Zepbound New prescription sent to pharmacy of record today

## 2024-02-17 NOTE — Patient Instructions (Signed)

## 2024-02-18 ENCOUNTER — Other Ambulatory Visit: Payer: Self-pay | Admitting: Radiology

## 2024-02-18 DIAGNOSIS — E663 Overweight: Secondary | ICD-10-CM

## 2024-02-19 ENCOUNTER — Encounter: Payer: Self-pay | Admitting: Rehabilitative and Restorative Service Providers"

## 2024-02-19 ENCOUNTER — Ambulatory Visit: Admitting: Rehabilitative and Restorative Service Providers"

## 2024-02-19 DIAGNOSIS — M25511 Pain in right shoulder: Secondary | ICD-10-CM

## 2024-02-19 DIAGNOSIS — M25611 Stiffness of right shoulder, not elsewhere classified: Secondary | ICD-10-CM

## 2024-02-19 DIAGNOSIS — M6281 Muscle weakness (generalized): Secondary | ICD-10-CM | POA: Diagnosis not present

## 2024-02-19 DIAGNOSIS — R600 Localized edema: Secondary | ICD-10-CM

## 2024-02-19 DIAGNOSIS — G8929 Other chronic pain: Secondary | ICD-10-CM

## 2024-02-19 NOTE — Therapy (Signed)
 OUTPATIENT PHYSICAL THERAPY SHOULDER TREATMENT   Patient Name: Atiya Yera MRN: [id number] DOB:1966/02/02, 58 y.o., female Today's Date: 02/19/2024  END OF SESSION:  PT End of Session - 02/19/24 1433     Visit Number 4    Number of Visits 16    Date for PT Re-Evaluation 03/19/24    Authorization Type [place] [employer]    Progress Note Due on Visit 16    PT Start Time 1430    PT Stop Time 1511    PT Time Calculation (min) 41 min    Activity Tolerance Patient tolerated treatment well;No increased pain    Behavior During Therapy WFL for tasks assessed/performed                Past Medical History:  Diagnosis Date   ACID REFLUX DISEASE 01/03/2008   Anemia    ANEMIA, IRON DEFICIENCY 12/23/2008   history   Anxiety    Chronic tension type headache 01/13/2009   migraines   Complication of anesthesia    pt states she just needs a small amount or she will sleep too long   Dysplasia of cervix, low grade (CIN 1)    Fibroid    GERD (gastroesophageal reflux disease)    HEART MURMUR, SYSTOLIC 07/07/2010   will have echo 06/18/2013   HYPOTHYROIDISM, POST-RADIATION 08/12/2009   Irritable bowel syndrome 12/23/2008   Migraine    Ovarian cyst    Ovarian cyst    THYROID  NODULE, RIGHT 04/21/2009   Past Surgical History:  Procedure Laterality Date   ABDOMINAL HYSTERECTOMY     COLONOSCOPY     HYSTEROSCOPY     LAPAROSCOPY     NOVASURE ABLATION     POLYPECTOMY     TUBAL LIGATION     UPPER GI ENDOSCOPY     VAGINAL HYSTERECTOMY N/A 06/24/2013   Procedure: TOTAL HYSTERECTOMY VAGINAL;  Surgeon: Mckinley Spells, MD;  Location: [facility];  Service: Gynecology;  Laterality: N/A;   Patient Active Problem List   Diagnosis Date Noted   Overweight 02/17/2024   Chronic right shoulder pain 11/26/2023   Musculoskeletal neck pain 06/12/2022   Cervical radiculopathy 04/10/2022   Statin intolerance 03/08/2022   Family history of heart disease 11/15/2021   Left leg pain 09/07/2021   Obstructive  sleep apnea 09/07/2021   Prediabetes 09/07/2021   Dyslipidemia 08/14/2021   Positive ANA (antinuclear antibody) 04/20/2021   Finger deformity 04/20/2021   Raynaud phenomenon 09/17/2019   Anxiety 07/30/2019   Chronic idiopathic constipation 01/08/2019   Psychophysiological insomnia 07/07/2018   Seasonal allergic rhinitis due to pollen 08/08/2017   Intractable migraine without aura and without status migrainosus 07/02/2016   Pancreas divisum of native pancreas 12/27/2014   Uterine leiomyoma 09/26/2012   GERD (gastroesophageal reflux disease)    Hypothyroidism 08/12/2009    PCP: Elvira Hammersmith, MD  REFERRING PROVIDER: Jasmine Mesi, PT, MPT  REFERRING DIAG: M25.511 (ICD-10-CM) - Right shoulder pain, unspecified chronicity  THERAPY DIAG:  Muscle weakness (generalized)  Localized edema  Stiffness of right shoulder, not elsewhere classified  Chronic right shoulder pain  Rationale for Evaluation and Treatment: Rehabilitation  ONSET DATE: Chronic, worse over the last year  SUBJECTIVE:  SUBJECTIVE STATEMENT: [place] notes improved AROM, pain and strength over the past few days and since starting physical therapy.  She continues to have good compliance with her home exercises.  Ritisha notes chronic shoulder pain.  Recent MRI shows small degenerative changes at the biceps anchor.  She is very right handed and thinks pushing and navigating heavy doors at work is the main cause.  Hand dominance: Right  PERTINENT HISTORY: Migraines, hypothyroid, hysterectomy, cervical radiculopathy, pre-diabetes  PAIN:  Are you having pain? Yes: NPRS scale: 0-4/10 this week Pain location: Rt shoulder Pain description: Ache Aggravating factors: Lie on the right side, doors at work, Chiropractor,  lifting Relieving factors: Tiger balm, ice, heat, rest, muscle relaxer (1/2 of a 5 mg)  PRECAUTIONS: Shoulder  RED FLAGS: None   WEIGHT BEARING RESTRICTIONS: No  FALLS:  Has patient fallen in last 6 months? 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  DIAGNOSTIC FINDINGS:  See Dr. Eliberto Grosser note  PATIENT SURVEYS:  Patient-Specific Activity Scoring Scheme  0 represents "unable to perform." 10 represents "able to perform at prior level. 0 1 2 3 4 5 6 7 8 9  10 (Date and Score)   Activity Eval   02/14/2024  1.  Throw Corn hole  5/10 5/10   2.  Lift greater than 10 pounds 4/10  4/10  3.  Exercise 5/10 5/10  4.    5.    Score 4.67 4.67   Total score = sum of the activity scores/number of activities Minimum detectable change (90%CI) for average score = 2 points Minimum detectable change (90%CI) for single activity score = 3 points     COGNITION: Overall cognitive status: Within functional limits for tasks assessed     SENSATION: Occasional Rt > Lt hand tingling and numbness, no particular pattern noted (median/ulnar/cervical spine)  POSTURE: Mild forward head, internally rotated and protracted shoulders  UPPER EXTREMITY ROM:   Passive ROM Left/Right 01/23/2024 Right 02/14/2024  Shoulder flexion 165/160 160  Shoulder extension    Shoulder abduction    Shoulder horizontal adduction 35/35 45  Shoulder internal rotation 65/30 55  Shoulder external rotation 90/90 100  Elbow flexion    Elbow extension    Wrist flexion    Wrist extension    Wrist ulnar deviation     Wrist radial deviation    Wrist pronation    Wrist supination    (Blank rows = not tested)  UPPER EXTREMITY STRENGTH:  In pounds assessed with hand-held dynamometer Left/Right 01/23/2024 Right 02/14/2024  Shoulder flexion    Shoulder extension    Shoulder abduction    Shoulder adduction    Shoulder internal rotation 30.5/22.7 28.1  Shoulder external rotation 18.2/5.3 9.9  Middle trapezius    Lower trapezius    Elbow flexion    Elbow extension    Wrist flexion    Wrist extension    Wrist ulnar deviation    Wrist radial deviation    Wrist pronation    Wrist supination    Grip strength (lbs)    (Blank rows = not tested)  TREATMENT DATE:  02/19/2024 UBE Push/Pull/Rest :20 intervals for 5 minutes Level 5 50 RPM Goal Shoulder blade pinches/scapular retraction 10 x 5 seconds Supine IR stretch 20 x 10 seconds Thera-Band external rotation red 2 sets of 10 x 3 seconds Thera-Band internal rotation 10 x 3 seconds  Functional Activities: For reaching at work: Supine arm raises 20 x 3 seconds with 3# Posterior capsule stretch 5 x 10 seconds Thumb up the back stretch (IR) 10 x 10 seconds Supine shoulder flexion (protract 1st, palm facing in) 10 x 10 seconds (overhead function) with 1# bar   02/14/2024 Shoulder blade pinches/scapular retraction 10 x 5 seconds Supine IR stretch 20 x 10 seconds Thera-Band external rotation red 2 sets of 10 x 3 seconds Thera-Band internal rotation 10 x 3 seconds  Functional Activities: For reaching at work: Supine arm raises 20 x 3 seconds with 3# Posterior capsule stretch 5 x 10 seconds Thumb up the back stretch (IR) 10 x 10 seconds Supine shoulder flexion (protract 1st, palm facing in) 10 x 10 seconds (overhead function)  [id number]: Reviewed objective findings, minor corrections and changes with her HEP   01/29/2024 Shoulder blade pinches/scapular retraction  10 x 5 seconds Supine IR stretch 15 x 10 seconds Thera-Band external rotation red 2 sets of 10 x 3 seconds Thera-Band internal rotation 10 x 3 seconds  Functional Activities: For reaching at work: Supine arm raises 20 x 3 seconds with 2# Posterior capsule stretch 10 x 10 seconds Thumb up the back stretch (IR) 10 x 10 seconds  [id number]: Reviewed examination findings, goals of therapeutic interventions and all home exercises   PATIENT EDUCATION: Education details: See above Person educated: Patient Education method: Explanation, Demonstration, Tactile cues, Verbal cues, and Handouts Education comprehension: verbalized understanding, returned demonstration, verbal cues required, tactile cues required, and needs further education  HOME EXERCISE PROGRAM: Access Code: [email or web address] URL: [email or web address] Date: 02/14/2024 Prepared by: Terral Ferrari  Exercises - Supine Scapular Protraction in Flexion with Dumbbells  - 1-2 x daily - 7 x weekly - 1 sets - 30 reps - 3 seconds hold - Supine Shoulder Internal Rotation Stretch  - 1-2 x daily - 7 x weekly - 1 sets - 10-20 reps - 10 seconds hold - Standing Scapular Retraction  - 5 x daily - 7 x weekly - 1 sets - 5 reps - 5 second hold - Shoulder External Rotation with Anchored Resistance  - 1-2 x daily - 7 x weekly - 2 sets - 10 reps - 3 hold - Standing Shoulder Posterior Capsule Stretch  - 1 x daily - 7 x weekly - 1 sets - 10 reps - 10 seconds hold - Standing Shoulder Internal Rotation Stretch with Hands Behind Back  - 2 x daily - 7 x weekly - 1 sets - 10 reps - 10 seconds hold - Shoulder Internal Rotation with Resistance  - 1-2 x daily - 7 x weekly - 1 sets - 10 reps - 3 seconds hold - Supine Shoulder Flexion Extension Full Range AROM  - 1 x daily - 7 x weekly - 1 sets - 10 reps - 10 seconds hold  Access Code: [email or web address] URL: [email or web address] 10 x 5 seconds Supine IR stretch 15 x 10  seconds Thera-Band external rotation red 2 sets of 10 x 3 seconds Thera-Band internal rotation 10 x 3 seconds  Functional Activities: For reaching at work: Supine arm raises 20 x 3 seconds with 2# Posterior capsule stretch 10 x 10 seconds Thumb  up the back stretch (IR) 10 x 10 seconds  [id number]: Reviewed examination findings, goals of therapeutic interventions and all home [email or web address] Date: 01/29/2024 Prepared by: Terral Ferrari  Exercises - Supine Scapular Protraction in Flexion with Dumbbells  - 2 x daily - 7 x weekly - 1 sets - 30 reps - 3 seconds hold - Supine Shoulder Internal Rotation Stretch  - 2 x daily - 7 x weekly - 1 sets - 10-20 reps - 10 seconds hold - Standing Scapular Retraction  - 5 x daily - 7 x weekly - 1 sets - 5 reps - 5 second hold - Shoulder External Rotation with Anchored Resistance  - 2 x daily - 7 x weekly - 2 sets - 10 reps - 3 hold - Standing Shoulder Posterior Capsule Stretch  - 2-3 x daily - 7 x weekly - 1 sets - 10 reps - 10 seconds hold - Standing Shoulder Internal Rotation Stretch with Hands Behind Back  - 2-3 x daily - 7 x weekly - 1 sets - 10 reps - 10 seconds hold - Shoulder Internal Rotation with Resistance  - 2 x daily - 7 x weekly - 1 sets - 10 reps - 3 seconds hold  ASSESSMENT:  CLINICAL IMPRESSION: Aiyonna notes that her active range of motion and strength continue to improve.  We reviewed and made appropriate progressions to her scapular and rotator cuff strength.  We discussed rechecking her active range of motion and strength next visit along with some functional testing as it appears her function has improved over the past week.  Patient is a 58 y.o. female who was seen today for physical therapy evaluation and treatment for M25.511 (ICD-10-CM) - Right shoulder pain, unspecified chronicity.  Judie has a greater than 1 year history of right shoulder pain with symptoms worsening over the last several months.  She has very  limited right shoulder internal rotation active range of motion and very weak right shoulder external rotation.  These were addressed with her day 1 home exercise program along with some education.  Her prognosis to meet the below listed goals is good with the recommended plan of care.  OBJECTIVE IMPAIRMENTS: decreased activity tolerance, decreased endurance, decreased knowledge of condition, decreased ROM, decreased strength, decreased safety awareness, increased edema, impaired perceived functional ability, impaired flexibility, impaired UE functional use, and pain.   ACTIVITY LIMITATIONS: carrying, lifting, sleeping, and reach over head  PARTICIPATION LIMITATIONS: driving, community activity, and occupation  PERSONAL FACTORS: Migraines, hypothyroid, hysterectomy, cervical radiculopathy, pre-diabetes are also affecting patient's functional outcome.   REHAB POTENTIAL: Good  CLINICAL DECISION MAKING: Stable/uncomplicated  EVALUATION COMPLEXITY: Low   GOALS: Goals reviewed with patient? Yes  SHORT TERM GOALS: Target date: 02/20/2024   Paizley will be independent with her day 1 home exercise program Baseline: Started 01/23/2024 Goal status: Met 01/29/2024  2.  Improve right shoulder internal rotation active range of motion to at least 50 degrees Baseline: 30 degrees Goal status: Met 02/14/2024   3.  Improve right shoulder external rotation strength to at least 10 pounds Baseline: 5.3 pounds Goal status: Ongoing 02/14/2024   LONG TERM GOALS: Target date: 03/19/2024  Improve Teleah's score on the patient's specific functional scale to at least 7/10 Baseline: 4.67/10 Goal status: Ongoing 02/14/2024  2.  Nene will report right shoulder pain no greater than 3/10 on the numeric pain rating scale Baseline: Can be 6/10 Goal status: Ongoing 02/19/2024  3.  Improve bilateral shoulder active range of motion for flexion to  170, horizontal adduction to 40 and internal rotation to at  least 60 degrees Baseline: 165/160; 35/35 and 65/30 respectively Goal status: Partially met 02/14/2024  4.  Improve right shoulder strength for internal rotation to at least 30 pounds and external rotation to at least 20 pounds Baseline: 22.7 and 5.3 respectively Goal status: Ongoing 02/14/2024  5.  Lachae will be independent with her long-term home maintenance exercise program at discharge Baseline: Started 01/23/2024 Goal status: INITIAL  PLAN:  PT FREQUENCY: 1-2x/week  PT DURATION: 8 weeks  PLANNED INTERVENTIONS: 97110-Therapeutic exercises, 97530- Therapeutic activity, 97112- Neuromuscular re-education, 97535- Self Care, [id number]- Manual therapy, 97016- Vasopneumatic device, Patient/Family education, Dry Needling, Cryotherapy, and Moist heat  PLAN FOR NEXT SESSION: Emphasis on scapular and rotator cuff strengthening.  Check AROM, strength and patient specific functional score.   Joli Neas, PT, MPT 02/19/2024, 3:19 PM

## 2024-02-21 ENCOUNTER — Telehealth: Payer: Self-pay | Admitting: Pharmacist

## 2024-02-21 NOTE — Telephone Encounter (Signed)
 Pharmacy Patient Advocate Encounter  Received notification from [facility] that Prior Authorization for Ubrelvy  100MG  tablets has been APPROVED from 02/21/2024 to 02/20/2025   PA #/Case ID/Reference #: [id number]

## 2024-02-27 ENCOUNTER — Ambulatory Visit: Admitting: Rehabilitative and Restorative Service Providers"

## 2024-02-27 ENCOUNTER — Encounter: Payer: Self-pay | Admitting: Rehabilitative and Restorative Service Providers"

## 2024-02-27 ENCOUNTER — Other Ambulatory Visit: Payer: Self-pay

## 2024-02-27 ENCOUNTER — Other Ambulatory Visit (HOSPITAL_COMMUNITY): Payer: Self-pay

## 2024-02-27 ENCOUNTER — Encounter (HOSPITAL_COMMUNITY): Payer: Self-pay

## 2024-02-27 DIAGNOSIS — M25511 Pain in right shoulder: Secondary | ICD-10-CM | POA: Diagnosis not present

## 2024-02-27 DIAGNOSIS — M25611 Stiffness of right shoulder, not elsewhere classified: Secondary | ICD-10-CM

## 2024-02-27 DIAGNOSIS — R600 Localized edema: Secondary | ICD-10-CM

## 2024-02-27 DIAGNOSIS — G8929 Other chronic pain: Secondary | ICD-10-CM

## 2024-02-27 DIAGNOSIS — M6281 Muscle weakness (generalized): Secondary | ICD-10-CM | POA: Diagnosis not present

## 2024-02-27 NOTE — Therapy (Addendum)
 OUTPATIENT PHYSICAL THERAPY SHOULDER TREATMENT/DISCHARGE  PHYSICAL THERAPY DISCHARGE SUMMARY  Visits from Start of Care: 5  Current functional level related to goals / functional outcomes: See note   Remaining deficits: See note   Education / Equipment: Updated home exercise program  Patient agrees to discharge. Patient goals were mostly met. Patient is being discharged due to being pleased with the current functional level.   Patient Name: Chelsey Hunt MRN: [id number] DOB:1966-03-13, 58 y.o., female Today's Date: 08/05/2024  END OF SESSION:    Past Medical History:  Diagnosis Date   ACID REFLUX DISEASE 01/03/2008   Anemia    ANEMIA, IRON DEFICIENCY 12/23/2008   history   Anxiety    Chronic tension type headache 01/13/2009   migraines   Complication of anesthesia    pt states she just needs a small amount or she will sleep too long   Dysplasia of cervix, low grade (CIN 1)    Fibroid    GERD (gastroesophageal reflux disease)    HEART MURMUR, SYSTOLIC 07/07/2010   will have echo 06/18/2013   HYPOTHYROIDISM, POST-RADIATION 08/12/2009   Irritable bowel syndrome 12/23/2008   Migraine    Ovarian cyst    Ovarian cyst    THYROID  NODULE, RIGHT 04/21/2009   Past Surgical History:  Procedure Laterality Date   ABDOMINAL HYSTERECTOMY     COLONOSCOPY     HYSTEROSCOPY     LAPAROSCOPY     NOVASURE ABLATION     POLYPECTOMY     TUBAL LIGATION     UPPER GI ENDOSCOPY     VAGINAL HYSTERECTOMY N/A 06/24/2013   Procedure: TOTAL HYSTERECTOMY VAGINAL;  Surgeon: Nena DELENA App, MD;  Location: [facility];  Service: Gynecology;  Laterality: N/A;   Patient Active Problem List   Diagnosis Date Noted   Dyslipidemia associated with type 2 diabetes mellitus (HCC) 05/19/2024   Overweight 02/17/2024   Chronic right shoulder pain 11/26/2023   Cervical radiculopathy 04/10/2022   Statin intolerance 03/08/2022   Family history of heart disease 11/15/2021   Obstructive sleep apnea  09/07/2021   Prediabetes 09/07/2021   Dyslipidemia 08/14/2021   Positive ANA (antinuclear antibody) 04/20/2021   Finger deformity 04/20/2021   Raynaud phenomenon 09/17/2019   Anxiety 07/30/2019   Chronic idiopathic constipation 01/08/2019   Psychophysiological insomnia 07/07/2018   Palpitations 04/16/2017   Intractable migraine without aura and without status migrainosus 07/02/2016   Pancreas divisum of native pancreas 12/27/2014   Right upper quadrant abdominal pain 12/21/2014   Uterine leiomyoma 09/26/2012   GERD (gastroesophageal reflux disease)    Hypothyroidism 08/12/2009    PCP: Emil Aloysius Schaumann, MD  REFERRING PROVIDER: Cordella Glendia Hutchinson, PT, MPT  REFERRING DIAG: M25.511 (ICD-10-CM) - Right shoulder pain, unspecified chronicity  THERAPY DIAG:  Muscle weakness (generalized)  Localized edema  Stiffness of right shoulder, not elsewhere classified  Chronic right shoulder pain  Rationale for Evaluation and Treatment: Rehabilitation  ONSET DATE: Chronic, worse over the last year  SUBJECTIVE:  SUBJECTIVE STATEMENT: Chelsey Hunt notes she has not needed any pain meds since her last visit.  She continues to have good compliance with her home exercises.  Chelsey Hunt notes chronic shoulder pain.  Recent MRI shows small degenerative changes at the biceps anchor.  She is very right handed and thinks pushing and navigating heavy doors at work is the main cause.  Hand dominance: Right  PERTINENT HISTORY: Migraines, hypothyroid, hysterectomy, cervical radiculopathy, pre-diabetes  PAIN:  Are you having pain? Yes: NPRS scale: 0-3/10 this week Pain location: Rt shoulder Pain description: Ache Aggravating factors: Lie on the right side, doors at work, chiropractor, lifting Relieving factors:  Chelsey Hunt, ice, heat, rest, muscle relaxer (1/2 of a 5 mg)  PRECAUTIONS: Shoulder  RED FLAGS: None   WEIGHT BEARING RESTRICTIONS: No  FALLS:  Has patient fallen in last 6 months? 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  DIAGNOSTIC FINDINGS:  See Dr. Brion note  PATIENT SURVEYS:  Patient-Specific Activity Scoring Scheme  0 represents "unable to perform." 10 represents "able to perform at prior level. 0 1 2 3 4 5 6 7 8 9  10 (Date and Score)   Activity Eval   02/14/2024 02/27/2024  1.  Throw Corn hole  5/10 5/10  5/10  2.  Lift greater than 10 pounds 4/10  4/10 7/10  3.  Exercise 5/10 5/10 7/10  4.     5.     Score 4.67 4.67 6.33   Total score = sum of the activity scores/number of activities Minimum detectable change (90%CI) for average score = 2 points Minimum detectable change (90%CI) for single activity score = 3 points     COGNITION: Overall cognitive status: Within functional limits for tasks assessed     SENSATION: Occasional Rt > Lt hand tingling and numbness, no particular pattern noted (median/ulnar/cervical spine)  POSTURE: Mild forward head, internally rotated and protracted shoulders  UPPER EXTREMITY ROM:   Passive ROM Left/Right 01/23/2024 Right 02/14/2024 Right 02/27/2024  Shoulder flexion 165/160 160 170  Shoulder extension     Shoulder abduction     Shoulder horizontal adduction 35/35 45 40  Shoulder internal rotation 65/30 55 60  Shoulder external rotation 90/90 100 95  Elbow flexion     Elbow extension     Wrist flexion     Wrist  extension     Wrist ulnar deviation     Wrist radial deviation     Wrist pronation     Wrist supination     (Blank rows = not tested)  UPPER EXTREMITY STRENGTH:  In pounds assessed with hand-held dynamometer Left/Right 01/23/2024 Right 02/14/2024 Right 02/27/2024  Shoulder flexion     Shoulder extension     Shoulder abduction     Shoulder adduction     Shoulder internal rotation 30.5/22.7 28.1 24.8  Shoulder external rotation 18.2/5.3 9.9 17.1  Middle trapezius     Lower trapezius     Elbow flexion     Elbow extension     Wrist flexion     Wrist extension     Wrist ulnar deviation  Wrist radial deviation     Wrist pronation     Wrist supination     Grip strength (lbs)     (Blank rows = not tested)                                                                                             TREATMENT DATE:  02/27/2024 UBE Push/Pull/Rest :10 interval sprint followed by :20 interval rest, clockwise/counter clockwise for 5 minutes Level 6 60 RPM Goal Shoulder blade pinches/scapular retraction 10 x 5 seconds Supine IR stretch 20 x 10 seconds Thera-Band external rotation red 2 sets of 10 x 3 seconds Thera-Band internal rotation 10 x 3 seconds  Functional Activities: For reaching at work: Supine arm raises 20 x 3 seconds with 5# Posterior capsule stretch 5 x 10 seconds Thumb up the back stretch (IR) 10 x 10 seconds Supine shoulder flexion (protract 1st, palm facing in) 10 x 10 seconds (overhead function) with 1# bar  [id number]: Reviewed reassessment findings and changes to HEP for long-term maintenance   02/19/2024 UBE Push/Pull/Rest :20 intervals for 5 minutes Level 5 50 RPM Goal Shoulder blade pinches/scapular retraction 10 x 5 seconds Supine IR stretch 20 x 10 seconds Thera-Band external rotation red 2 sets of 10 x 3 seconds Thera-Band internal rotation 10 x 3 seconds  Functional Activities: For reaching at work: Supine arm raises 20 x 3 seconds with 3# Posterior capsule  stretch 5 x 10 seconds Thumb up the back stretch (IR) 10 x 10 seconds Supine shoulder flexion (protract 1st, palm facing in) 10 x 10 seconds (overhead function) with 1# bar   02/14/2024 Shoulder blade pinches/scapular retraction 10 x 5 seconds Supine IR stretch 20 x 10 seconds Thera-Band external rotation red 2 sets of 10 x 3 seconds Thera-Band internal rotation 10 x 3 seconds  Functional Activities: For reaching at work: Supine arm raises 20 x 3 seconds with 3# Posterior capsule stretch 5 x 10 seconds Thumb up the back stretch (IR) 10 x 10 seconds Supine shoulder flexion (protract 1st, palm facing in) 10 x 10 seconds (overhead function)  [id number]: Reviewed objective findings, minor corrections and changes with her HEP   PATIENT EDUCATION: Education details: See above Person educated: Patient Education method: Explanation, Demonstration, Tactile cues, Verbal cues, and Handouts Education comprehension: verbalized understanding, returned demonstration, verbal cues required, tactile cues required, and needs further education  HOME EXERCISE PROGRAM: Access Code: [email or web address] URL: [email or web address] Date: 02/27/2024 Prepared by: Chelsey Hunt  Exercises - Supine Scapular Protraction in Flexion with Dumbbells  - 1 x daily - 3 x weekly - 1 sets - 30 reps - 3 seconds hold - Supine Shoulder Internal Rotation Stretch  - 1 x daily - 7 x weekly - 1 sets - 15 reps - 10 seconds hold - Standing Scapular Retraction  - 3 x daily - 7 x weekly - 1 sets - 5 reps - 5 second hold - Shoulder External Rotation with Anchored Resistance  - 1 x daily - 3 x weekly - 2 sets - 10 reps - 3 hold - Standing Shoulder Posterior Capsule Stretch  - 1 x  daily - 1 x weekly - 1 sets - 10 reps - 10 seconds hold - Standing Shoulder Internal Rotation Stretch with Hands Behind Back  - 1 x daily - 7 x weekly - 1 sets - 5-10 reps - 10 seconds hold - Shoulder Internal Rotation with Resistance  - 1 x daily - 3 x  weekly - 1 sets - 10 reps - 3 seconds hold - Supine Shoulder Flexion Extension Full Range AROM  - 1 x daily - 1 x weekly - 1 sets - 10 reps - 10 seconds hold  ASSESSMENT:  CLINICAL IMPRESSION: Chelsey Hunt has made excellent subjective, objective and functional progress since starting supervised physical therapy.  Her active range of motion, strength and function are significantly improved and Chelsey Hunt has demonstrated independence with her comprehensive home maintenance exercise program.  Her prognosis to continue to do well with consistent compliance with this program is very good and Chelsey Hunt appears ready to transfer into independent rehabilitation.   Patient is a 58 y.o. female who was seen today for physical therapy evaluation and treatment for M25.511 (ICD-10-CM) - Right shoulder pain, unspecified chronicity.  Chelsey Hunt has a greater than 1 year history of right shoulder pain with symptoms worsening over the last several months.  She has very limited right shoulder internal rotation active range of motion and very weak right shoulder external rotation.  These were addressed with her day 1 home exercise program along with some education.  Her prognosis to meet the below listed goals is good with the recommended plan of care.  OBJECTIVE IMPAIRMENTS: decreased activity tolerance, decreased endurance, decreased knowledge of condition, decreased ROM, decreased strength, decreased safety awareness, increased edema, impaired perceived functional ability, impaired flexibility, impaired UE functional use, and pain.   ACTIVITY LIMITATIONS: carrying, lifting, sleeping, and reach over head  PARTICIPATION LIMITATIONS: driving, community activity, and occupation  PERSONAL FACTORS: Migraines, hypothyroid, hysterectomy, cervical radiculopathy, pre-diabetes are also affecting patient's functional outcome.   REHAB POTENTIAL: Good  CLINICAL DECISION MAKING: Stable/uncomplicated  EVALUATION COMPLEXITY:  Low   GOALS: Goals reviewed with patient? Yes  SHORT TERM GOALS: Target date: 02/20/2024   Chelsey Hunt will be independent with her day 1 home exercise program Baseline: Started 01/23/2024 Goal status: Met 01/29/2024  2.  Improve right shoulder internal rotation active range of motion to at least 50 degrees Baseline: 30 degrees Goal status: Met 02/14/2024   3.  Improve right shoulder external rotation strength to at least 10 pounds Baseline: 5.3 pounds Goal status: Met 02/27/2024   LONG TERM GOALS: Target date: 03/19/2024  Improve Chelsey Hunt's score on the patient's specific functional scale to at least 7/10 Baseline: 4.67/10 Goal status: Ongoing 02/27/2024  2.  Chelsey Hunt will report right shoulder pain no greater than 3/10 on the numeric pain rating scale Baseline: Can be 6/10 Goal status: Met 02/27/2024  3.  Improve bilateral shoulder active range of motion for flexion to 170, horizontal adduction to 40 and internal rotation to at least 60 degrees Baseline: 165/160; 35/35 and 65/30 respectively Goal status: Met 02/27/2024  4.  Improve right shoulder strength for internal rotation to at least 30 pounds and external rotation to at least 20 pounds Baseline: 22.7 and 5.3 respectively Goal status: Ongoing 02/27/2024  5.  Chelsey Hunt will be independent with her long-term home maintenance exercise program at discharge Baseline: Started 01/23/2024 Goal status: Met 02/27/2024  PLAN:  PT FREQUENCY: DC  PT DURATION: DC  PLANNED INTERVENTIONS: 97110-Therapeutic exercises, 97530- Therapeutic activity, 97112- Neuromuscular re-education, 97535- Self Care, [id number]-  Manual therapy, 97016- Vasopneumatic device, Patient/Family education, Dry Needling, Cryotherapy, and Moist heat  PLAN FOR NEXT SESSION: DC today   Myer LELON Hunt, PT, MPT 08/05/2024, 2:02 PM

## 2024-03-06 ENCOUNTER — Encounter: Admitting: Physical Therapy

## 2024-03-11 ENCOUNTER — Telehealth: Payer: Self-pay | Admitting: Neurology

## 2024-03-11 DIAGNOSIS — Z0279 Encounter for issue of other medical certificate: Secondary | ICD-10-CM

## 2024-03-11 NOTE — Telephone Encounter (Signed)
 [employer] paper work paid and inbox, fax and copy up front needed

## 2024-03-16 NOTE — Telephone Encounter (Signed)
 On your desk. Please reveiw

## 2024-03-17 NOTE — Telephone Encounter (Signed)
 LMOVM for patient, FMLA paperwork for her husband is ready at the front desk.    Fax paperwork to job.

## 2024-03-27 ENCOUNTER — Encounter: Attending: Emergency Medicine | Admitting: Dietician

## 2024-03-27 ENCOUNTER — Encounter: Payer: Self-pay | Admitting: Dietician

## 2024-03-27 VITALS — Ht 60.0 in | Wt 148.8 lb

## 2024-03-27 DIAGNOSIS — E663 Overweight: Secondary | ICD-10-CM | POA: Insufficient documentation

## 2024-03-27 NOTE — Patient Instructions (Signed)
 Goals Established by Pt  Goal 1: go to the gym 2 days a week for at least 1 hour.   Goal 2: during snacks aim to incorporate a carb + protein (examples: tuna and triscuits, apple and peanut butter, fruit and nuts, etc).   Goal 3: cook at home at least 2 nights per week (aim to include 1/2 plate non-starchy vegetables, 1/4 plate protein, and 1/4 plate complex carbs).   [email or web address]  [email or web address]

## 2024-03-27 NOTE — Progress Notes (Signed)
 Medical Nutrition Therapy  Appointment Start time:  1040  Appointment End time:  1125  Primary concerns today: eat healthier, curb cravings   Referral diagnosis: E66.3; Employee Visit 1 Preferred learning style: no preference indicated Learning readiness: ready   NUTRITION ASSESSMENT   Anthropometrics  Ht: 60 in Wt: 148.8 lb  Clinical Medical Hx: anemia, anxiety, GERD, heart murmur, thyroid  disease, prediabetes, HLD, acid reflux, IBS-C Medications: levothyroxine  Labs: 05/14/23: A1c 6.1%, chol 213, LDL 124 Notable Signs/Symptoms: constipation Food Allergies: none reported; lactose intolerant  Lifestyle & Dietary Hx  Pt reports she wants to work toward eating healthier and curbing her cravings.   Pt states she works 3rd shift, 7pm-7am 3 days per week non-consecutive. Pt reports on days off she flips her schedule and sleeps at night. Pt reports on work days typically getting up around 3pm, eating a meal at 4pm, and then during the shift having popcorn, fruit, and chips, and may eat oatmeal when she gets off. Pt reports on days off she typically has breakfast and dinner.   Pt reports she is not cooking at home often right now. Pt states she feels she and her husband eat out over half the time. Pt reports her husband prefers eating out.   Pt reports work has been more stressful recently. Pt states she notices she stress eats.   Pt states when she gets mad she likes to walk. Pt reports she has a [employer] membership and last year was going 3 days a week but hasn't been going.   Pt states she has IBS with constipation.  Estimated daily fluid intake: 48-64 oz Supplements: magnesium, vitamin D ,  Sleep: if not working 10 hours, if working 2-5 hours Stress / self-care: 'very high' pt report; reports stress eating Current average weekly physical activity: ADLs  24-Hr Dietary Recall First Meal: 4pm: chicken tenders/salmon, mashed potatoes, green beans OR 3-4 slices pizza Snack:  chips OR fruit OR popcorn Second Meal: 3am: fruit OR popcorn Snack: none Third Meal: oatmeal with raisins, butter and honey OR none Snack: none Beverages: decaf coffee, regular coffee occasionally, water, occasional ginger ale   NUTRITION DIAGNOSIS  NB-1.1 Food and nutrition-related knowledge deficit As related to lack of prior education by a registered dietitian.  As evidenced by pt report.   NUTRITION INTERVENTION  Nutrition education (E-1) on the following topics:   Food Groups Fruits & Vegetables: Aim to fill half your plate with a variety of fruits and vegetables. They are rich in vitamins, minerals, and fiber, and can help reduce the risk of chronic diseases. Choose a colorful assortment of fruits and vegetables to ensure you get a wide range of nutrients. Grains and Starches: Make at least half of your grain choices whole grains, such as brown rice, whole wheat bread, and oats. Whole grains provide fiber, which aids in digestion and healthy cholesterol levels. Aim for whole forms of starchy vegetables such as potatoes, sweet potatoes, beans, peas, and corn, which are fiber rich and provide many vitamins and minerals.  Protein: Incorporate lean sources of protein, such as poultry, fish, beans, nuts, and seeds, into your meals. Protein is essential for building and repairing tissues, staying full, balancing blood sugar, as well as supporting immune function. Dairy: Include low-fat or fat-free dairy products like milk, yogurt, and cheese in your diet. Dairy foods are excellent sources of calcium  and vitamin D , which are crucial for bone health.   Stress and GI Impact on Nutrition:    - Stress and  anxiety can disrupt eating habits, leading to emotional eating (overeating comfort foods) or a loss of appetite (skipping meals or under-eating). Both can negatively impact nutrition, causing weight gain or nutrient deficiencies.    - Anxiety often triggers cravings for unhealthy foods like  sweets and salty snacks, which provide quick energy but lack essential nutrients, affecting overall health and nutrition.    - Stress can also cause changes in eating patterns, such as eating too quickly or overeating, which can result in bloating, indigestion, or other GI discomforts.  Exercise Finding an exercise you enjoy is crucial for maintaining long-term fitness and overall health. Enjoyable activities are more likely to become regular habits, making it easier to stay consistent with physical activity. When you look forward to your workouts, exercise becomes a positive experience rather than a chore, reducing the likelihood of burnout or quitting. Enjoyable exercise also enhances mental well-being, as engaging in activities you love can boost mood, reduce stress, and provide a sense of accomplishment. Aim for 150 minutes of physical activity weekly. Make physical activity a part of your week. Try to include at least 30 minutes of physical activity 5 days each week or at least 150 minutes per week.  Handouts Provided Include  Plate Method Snack Sheet  Learning Style & Readiness for Change Teaching method utilized: Visual & Auditory  Demonstrated degree of understanding via: Teach Back  Barriers to learning/adherence to lifestyle change: none  Goals Established by Pt  Goal 1: go to the gym 2 days a week for at least 1 hour.   Goal 2: during snacks aim to incorporate a carb + protein (examples: tuna and triscuits, apple and peanut butter, fruit and nuts, etc).   Goal 3: cook at home at least 2 nights per week (aim to include 1/2 plate non-starchy vegetables, 1/4 plate protein, and 1/4 plate complex carbs).   [email or web address]  [email or web address]   MONITORING & EVALUATION Dietary intake, weekly physical activity, and follow up in 6 weeks.  Next Steps  Patient is to call for questions.

## 2024-04-07 NOTE — Progress Notes (Unsigned)
 NEUROLOGY FOLLOW UP OFFICE NOTE  Chelsey Hunt [id number]  Assessment/Plan:   Migraine without aura, without status migrainosus, not intractable  Bilateral upper extremity numbness - she does have cervical spondylosis but symptoms still suspicious for carpal tunnel syndrome.   Migraine prevention:  She is agreeable to trying Nurtec as a preventative (1 tablet every other day). Migraine rescue:  Ubrelvy  Cyclobenzaprine  for neck pain   CTS *** Advised to contact Dr. Obie office at the sleep clinic at [facility] to follow up on treatment for her OSA.  Follow up in ***   Subjective:  Chelsey Hunt is a 58 year old right-handed woman with hypothyroidism who follows up for migraines.   UPDATE: Started Nurtec as preventative. Intensity:  *** Duration:  *** with Ubrelvy  Frequency:  ***  Repeat NCV-EMG of upper extremities on 11/07/2023 showed new mild right median neuropathy at or distal to the wrist and also demonstrated mildly progressed moderate bilateral ulnar neuropathy with slowing across the elbows.  ***  Current analgesics: Tylenol , ibuprofen  Current triptans: None Current ergotamine: None Current abortive anti-CGRP:  none Current anti-emetic: Promethazine  25mg  Current muscle relaxants: Cyclobenzaprine  10 mg (neck pain) Current anti-anxiolytic: None Current sleep aide: None Current Antihypertensive medications: None Current Antidepressant medications: none Current Anticonvulsant medications: none Current anti-CGRP: Ubrelvy  100mg , Nurtec 75mg  every other day *** Current Vitamins/Herbal/Supplements: magnesium Current Antihistamines/Decongestants: None Other therapy: Physical therapy for neck pain; dry needling, chiropractor Hormone/birth control: None  For ongoing bilateral upper extremity tingling and right arm pain, she had MRI of cervical spine on 08/03/2023 personally reviewed which redemonstrated cervical spondylosis and degenerative disc disease causing mild  right foraminal stenosis at C4-5.  She was referred to neurosurgery but she never received a call.  She wakes up with the numbness and needs to shake her hands out.  Involves entire hand but primarily the thumb and index finger.     Caffeine: No Diet: increased water intake.  1/2 gallon water daily.   Exercise: She is now exercising - treadmill and bike 2 days a week.  Trying to increase frequency  Depression: No; Anxiety: Yes Other pain:  neck pain.  She has cervical spondylosis Sleep hygiene: She has OSA.  She is not using a CPAP.   HISTORY: Onset: First migraine was between 79 and 12 years old.  Migraine free until 2004 during her menstrual cycle.  Chronic for many years. Location:  Left more than right. Quality:  throbbing Initial intensity:  Severe.   Aura:  Initially no.  Later developed visual aura of strands of hair floating in her vision. Prodrome:  no Postdrome:  fatigue Associated symptoms:  Right facial tingling, nausea, photophobia, phonophobia, osmophobia.  She denies associated vomiting, visual disturbance or unilateral numbness or weakness. Initial duration:  3 days Initial Frequency:  daily Initial Frequency of abortive medication: Advil  or Tylenol  daily, Maxalt  2 days a week Triggers:  Certain smells, change in barometric pressure, sausages, certain cheese, bananas; brazilian nutes, COVID-19 (contracted in September 2020) Relieving factors: Exercise, staying hydrated Activity:  aggravates   During summer 2019, she has had throbbing occipital headache, right-sided neck pain radiating up to the occiput, a burning scalp neuralgia, associated with tinnitus, sometimes left or right sided facial numbness, facial twitching.  She started having a panic attack with palpitations.  She went to the ED on 06/26/18 for symptoms.  EKG showed sinus tachycardia.  She was treated with headache cocktail.  To evaluate worsening headaches, she had an MRI of the brain on 11/21/2018  which  demonstrated nonspecific white matter hyperintensities in the cerebral white matter, likely chronic small vessel ischemic changes, but no acute abnormality.  For further evaluation of neck pain, cervical spine X-ray on 04/10/2019 showed slight refersal of the normal cervical lordosis with minimal retrolisthesis of C5 on C6 and cervical spondylosis with osteophytes most notable at C4-5 and C5-6.  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   Due to fatigue, her PCP checked for autoimmune condition.  ANA was positive with positive cyclic citrullin peptide antibody.  She has been referred to rheumatology.  She saw sleep medicine.  She was found to have moderate OSA and was ordered auto-PAP.      She has bilateral tinnitus.  Exam by ENT unremarkable.    Past NSAIDS: Ibuprofen , naproxen , Cambia Past analgesics:  Excedrin, Lidocaine  nasal Past abortive triptans: Relpax, sumatriptan  tablet, Maxalt , Tosymra  NS, Zembrace.   Past abortive ergotamine: Migranal  Past muscle relaxants:  none Past anti-emetic:  Zofran  Past antihypertensive medications: Verapamil, propranolol Past antidepressant medications: Imipramine, nortriptyline  Past anticonvulsant medications:  topiramate, gabapentin  100mg  QHS (for scalp neuralgia; cannot tolerate higher doses) Past anti-CGRP:  Aimovig  (aggravated migraine), Emgality  (ineffective), Ajovy , Nurtec PRN Past vitamins/Herbal/Supplements: Riboflavin, CoQ10 Past antihistamines/decongestants:  None Other past therapies:  acupuncture (triggered migraine), Reyvow    Family history of headache:  Mom (migraines), daughter (migraines)   MRI and MRA of the head from 03/09/2009 revealed nonspecific small subcortical white matter hyperintensities and 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  MRI of the brain on 11/21/2018 which demonstrated nonspecific white  matter hyperintensities in the cerebral white matter.  To evaluate new visual aura with migraine (hair floating in her vision), MRI of brain with and without contrast performed on 02/10/2020 again demonstrated non-enhancing white matter T2 and FLAIR signal in the cerebral hemispheres, stable compared to prior imaging from 2020.  To further evaluate abnormal white matter on brain MRI, she had MRI of cervical and thoracic spine without contrast on 03/26/2020 which showed no cord lesions but did show incidental 1 cm T1 and T2 dark ovoid lesion in the T6 vertebral body.  Follow up MRI thoracic spine with contrast on 05/25/2020 showed no change and no enhancement, thought to represent benign marrow focus .  Vasculitis workup, including SSA/SSB antibodies and pan-ANCA panel, was negative.  Sed rate was 14 and CRP negative.She was evaluated by ophthalmology in September 2021 which demonstrated dry eye but no concerning findings such as optic neuritis.  PAST MEDICAL HISTORY: Past Medical History:  Diagnosis Date   ACID REFLUX DISEASE 01/03/2008   Anemia    ANEMIA, IRON DEFICIENCY 12/23/2008   history   Anxiety    Chronic tension type headache 01/13/2009   migraines   Complication of anesthesia    pt states she just needs a small amount or she will sleep too long   Dysplasia of cervix, low grade (CIN 1)    Fibroid    GERD (gastroesophageal reflux disease)    HEART MURMUR, SYSTOLIC 07/07/2010   will have echo 06/18/2013   HYPOTHYROIDISM, POST-RADIATION 08/12/2009   Irritable bowel syndrome 12/23/2008   Migraine    Ovarian cyst    Ovarian cyst    THYROID  NODULE, RIGHT 04/21/2009    MEDICATIONS: Current Outpatient Medications on File Prior to Visit  Medication Sig Dispense Refill   acetaminophen  (TYLENOL ) 325 MG tablet Take 650 mg by mouth every 6 (six) hours as  needed for mild pain.     Cholecalciferol (D3 2000 PO) Take by mouth.     cyclobenzaprine  (FLEXERIL ) 5 MG tablet Take 1 tablet (5 mg total) by  mouth 3 (three) times daily as needed for muscle spasms. 30 tablet 2   dicyclomine  (BENTYL ) 10 MG capsule Take 1 capsule (10 mg total) by mouth 3 times daily. as needed (Patient not taking: Reported on 02/17/2024) 90 capsule 2   dicyclomine  (BENTYL ) 10 MG capsule Take 1 capsule (10 mg total) by mouth 3 (three) times daily as needed. 90 capsule 3   ezetimibe  (ZETIA ) 10 MG tablet Take 1 tablet (10 mg total) by mouth every evening. 90 tablet 3   ibuprofen  (ADVIL ,MOTRIN ) 200 MG tablet Take 200 mg by mouth every 8 (eight) hours as needed.     levothyroxine  (SYNTHROID ) 75 MCG tablet Take 1 tablet (75 mcg total) by mouth daily. 90 tablet 3   magnesium 30 MG tablet Take 30 mg by mouth daily.     pantoprazole  (PROTONIX ) 20 MG tablet Take 1 tablet (20 mg total) by mouth 2 (two) times daily before a meal. Take 30-45 minutes prior to breakfast and dinner. (Patient not taking: Reported on 02/17/2024) 180 tablet 1   pantoprazole  (PROTONIX ) 20 MG tablet Take 1 tablet (20 mg total) by mouth before breakfast and before evening meal. 180 tablet 3   promethazine  (PHENERGAN ) 25 MG tablet Take 1 tablet (25 mg total) by mouth every 6 (six) hours as needed for nausea or vomiting. 30 tablet 5   Rimegepant Sulfate  (NURTEC) 75 MG TBDP Take 1 tablet (75 mg total) by mouth every other day. 16 tablet 5   tirzepatide  (ZEPBOUND ) 2.5 MG/0.5ML Pen Inject 2.5 mg into the skin once a week. Will increase dose to 5 mg weekly after 3 to 4 weeks if side effects tolerated. 2 mL 1   Ubrogepant  (UBRELVY ) 100 MG TABS Take 1 tablet (100 mg) by mouth as needed. May repeat dose in 2 hours.  Maximum 2 tablets in 24 hours. 16 tablet 5   valACYclovir  (VALTREX ) 1000 MG tablet Take 1 tablet (1,000 mg total) by mouth every 12 (twelve) hours for 5 days. 30 tablet 11   No current facility-administered medications on file prior to visit.    ALLERGIES: Allergies  Allergen Reactions   Imitrex  [Sumatriptan ] Palpitations   Reglan  [Metoclopramide ]  Palpitations   Rosuvastatin  Other (See Comments)    Arthralgia and myalgia   Propoxyphene N-Acetaminophen     Nabumetone Rash    REACTION: GI upset   Topiramate Rash    REACTION: rash    FAMILY HISTORY: Family History  Problem Relation Age of Onset   Heart disease Mother    Thyroid  disease Mother    Heart attack Mother    Cancer Father    Heart disease Father    Heart attack Father    Throat cancer Father    Lung cancer Father    Alcoholism Father    Graves' disease Sister    Asthma Sister    Hepatitis Sister    Stomach cancer Sister    CVA Brother    Alzheimer's disease Brother    Goiter Brother    Hypertension Brother    Hypertension Brother    Alcoholism Brother    Hyperlipidemia Maternal Aunt    Hypertension Paternal Grandfather    Healthy Daughter    GER disease Son    GER disease Son    Goiter Other  Siblings 2   Depression Neg Hx    Alcohol abuse Neg Hx    Drug abuse Neg Hx    Early death Neg Hx    Hearing loss Neg Hx    Kidney disease Neg Hx    Stroke Neg Hx       Objective:  *** General: No acute distress.  Patient appears well-groomed.   Sensation in hands/fingers intact to light touch.  Tinel's sign negative. Head:  Normocephalic/atraumatic Neck:  Supple.  No paraspinal tenderness.  Full range of motion. Heart:  Regular rate and rhythm. Neuro:  Alert and oriented.  Speech fluent and not dysarthric.  Language intact.  CN II-XII intact.  Bulk and tone normal.  Muscle strength 5/5 throughout.  Sensation to light touch intact.  Deep tendon reflexes 2+ throughout, toes downgoing.  Gait normal.  Romberg negative.    Juliene Dunnings, DO  CC: Emil Aloysius Schaumann, MD

## 2024-04-08 ENCOUNTER — Ambulatory Visit: Payer: Commercial Managed Care - PPO | Admitting: Neurology

## 2024-04-08 ENCOUNTER — Encounter: Payer: Self-pay | Admitting: Neurology

## 2024-04-08 ENCOUNTER — Other Ambulatory Visit (HOSPITAL_COMMUNITY): Payer: Self-pay

## 2024-04-08 VITALS — BP 137/66 | HR 91 | Ht 60.0 in | Wt 150.0 lb

## 2024-04-08 DIAGNOSIS — G4733 Obstructive sleep apnea (adult) (pediatric): Secondary | ICD-10-CM | POA: Diagnosis not present

## 2024-04-08 DIAGNOSIS — G43009 Migraine without aura, not intractable, without status migrainosus: Secondary | ICD-10-CM

## 2024-04-08 DIAGNOSIS — G5623 Lesion of ulnar nerve, bilateral upper limbs: Secondary | ICD-10-CM

## 2024-04-08 DIAGNOSIS — G5601 Carpal tunnel syndrome, right upper limb: Secondary | ICD-10-CM

## 2024-04-08 MED ORDER — PROMETHAZINE HCL 25 MG PO TABS
25.0000 mg | ORAL_TABLET | Freq: Four times a day (QID) | ORAL | 5 refills | Status: AC | PRN
  Filled 2024-04-08: qty 30, 8d supply, fill #0

## 2024-04-20 ENCOUNTER — Telehealth: Payer: Self-pay | Admitting: Pharmacy Technician

## 2024-04-20 ENCOUNTER — Other Ambulatory Visit (HOSPITAL_COMMUNITY): Payer: Self-pay

## 2024-04-20 NOTE — Telephone Encounter (Signed)
 Pharmacy Patient Advocate Encounter  Received notification from [employer] that Prior Authorization for NURTEC 75MG  has been APPROVED from 8.11.25 to 8.11.26. Ran test claim, Copay is $0. This test claim was processed through [facility]- copay amounts may vary at other pharmacies due to pharmacy/plan contracts, or as the patient moves through the different stages of their insurance plan.   PA #/Case ID/Reference #: [id number]

## 2024-04-20 NOTE — Telephone Encounter (Signed)
 Pharmacy Patient Advocate Encounter   Received notification from CoverMyMeds that prior authorization for NURTEC 75MG  is required/requested.   Insurance verification completed.   The patient is insured through [facility] .   Per test claim: PA required; PA submitted to above mentioned insurance via CoverMyMeds Key/confirmation #/EOC [id number] Status is pending

## 2024-05-04 ENCOUNTER — Encounter: Payer: Self-pay | Admitting: Emergency Medicine

## 2024-05-04 ENCOUNTER — Ambulatory Visit: Admitting: Emergency Medicine

## 2024-05-04 VITALS — BP 128/72 | HR 89 | Temp 98.0°F | Ht 60.0 in | Wt 149.0 lb

## 2024-05-04 DIAGNOSIS — R062 Wheezing: Secondary | ICD-10-CM | POA: Insufficient documentation

## 2024-05-04 DIAGNOSIS — R6889 Other general symptoms and signs: Secondary | ICD-10-CM | POA: Insufficient documentation

## 2024-05-04 DIAGNOSIS — U071 COVID-19: Secondary | ICD-10-CM | POA: Insufficient documentation

## 2024-05-04 LAB — POC COVID19 BINAXNOW: SARS Coronavirus 2 Ag: POSITIVE — AB

## 2024-05-04 NOTE — Assessment & Plan Note (Signed)
 Wheezing much improved. No wheezing on physical examination No need for corticosteroids at this time No history of asthma

## 2024-05-04 NOTE — Progress Notes (Signed)
 Chelsey Hunt 58 y.o.   Chief Complaint  Patient presents with   Wheezing    Wheezing and cough, itch throat and some pain in the neck and shoulder area     HISTORY OF PRESENT ILLNESS: This is a 58 y.o. female complaining of flulike symptoms including wheezing that started 3 to 4 days ago Feeling better today. No other complaints or medical concerns today.  Wheezing  Associated symptoms include coughing, headaches and a sore throat. Pertinent negatives include no abdominal pain, chest pain, chills, diarrhea, fever or vomiting.     Prior to Admission medications   Medication Sig Start Date End Date Taking? Authorizing Provider  acetaminophen  (TYLENOL ) 325 MG tablet Take 650 mg by mouth every 6 (six) hours as needed for mild pain.   Yes [provider]  Cholecalciferol (D3 2000 PO) Take by mouth.   Yes [provider]  cyclobenzaprine  (FLEXERIL ) 5 MG tablet Take 1 tablet (5 mg total) by mouth 3 (three) times daily as needed for muscle spasms. 12/23/23  Yes Patel, Donika K, DO  dicyclomine  (BENTYL ) 10 MG capsule Take 1 capsule (10 mg total) by mouth 3 (three) times daily as needed. 01/24/24  Yes   ezetimibe  (ZETIA ) 10 MG tablet Take 1 tablet (10 mg total) by mouth every evening. 05/14/23  Yes Azelia Reiger, Emil Schanz, MD  ibuprofen  (ADVIL ,MOTRIN ) 200 MG tablet Take 200 mg by mouth every 8 (eight) hours as needed.   Yes [provider]  levothyroxine  (SYNTHROID ) 75 MCG tablet Take 1 tablet (75 mcg total) by mouth daily. 05/14/23  Yes Tanishi Nault, Emil Schanz, MD  magnesium 30 MG tablet Take 30 mg by mouth daily.   Yes [provider]  pantoprazole  (PROTONIX ) 20 MG tablet Take 1 tablet (20 mg total) by mouth before breakfast and before evening meal. 01/24/24  Yes   promethazine  (PHENERGAN ) 25 MG tablet Take 1 tablet (25 mg total) by mouth every 6 (six) hours as needed for nausea or vomiting. 04/08/24  Yes Jaffe, Adam R, DO  Rimegepant Sulfate  (NURTEC) 75 MG TBDP  Take 1 tablet (75 mg total) by mouth every other day. 10/09/23  Yes Jaffe, Adam R, DO  Ubrogepant  (UBRELVY ) 100 MG TABS Take 1 tablet (100 mg) by mouth as needed. May repeat dose in 2 hours.  Maximum 2 tablets in 24 hours. 10/09/23  Yes Jaffe, Adam R, DO  valACYclovir  (VALTREX ) 1000 MG tablet Take 1 tablet (1,000 mg total) by mouth every 12 (twelve) hours for 5 days. 10/01/23  Yes     Allergies  Allergen Reactions   Imitrex  [Sumatriptan ] Palpitations   Reglan  [Metoclopramide ] Palpitations   Rosuvastatin  Other (See Comments)    Arthralgia and myalgia   Propoxyphene N-Acetaminophen     Nabumetone Rash    REACTION: GI upset   Topiramate Rash    REACTION: rash    Patient Active Problem List   Diagnosis Date Noted   Overweight 02/17/2024   Chronic right shoulder pain 11/26/2023   Musculoskeletal neck pain 06/12/2022   Cervical radiculopathy 04/10/2022   Statin intolerance 03/08/2022   Family history of heart disease 11/15/2021   Left leg pain 09/07/2021   Obstructive sleep apnea 09/07/2021   Prediabetes 09/07/2021   Dyslipidemia 08/14/2021   Positive ANA (antinuclear antibody) 04/20/2021   Finger deformity 04/20/2021   Raynaud phenomenon 09/17/2019   Anxiety 07/30/2019   Chronic idiopathic constipation 01/08/2019   Psychophysiological insomnia 07/07/2018   Seasonal allergic rhinitis due to pollen 08/08/2017   Intractable migraine without aura  and without status migrainosus 07/02/2016   Pancreas divisum of native pancreas 12/27/2014   Uterine leiomyoma 09/26/2012   GERD (gastroesophageal reflux disease)    Hypothyroidism 08/12/2009    Past Medical History:  Diagnosis Date   ACID REFLUX DISEASE 01/03/2008   Anemia    ANEMIA, IRON DEFICIENCY 12/23/2008   history   Anxiety    Chronic tension type headache 01/13/2009   migraines   Complication of anesthesia    pt states she just needs a small amount or she will sleep too long   Dysplasia of cervix, low grade (CIN 1)    Fibroid     GERD (gastroesophageal reflux disease)    HEART MURMUR, SYSTOLIC 07/07/2010   will have echo 06/18/2013   HYPOTHYROIDISM, POST-RADIATION 08/12/2009   Irritable bowel syndrome 12/23/2008   Migraine    Ovarian cyst    Ovarian cyst    THYROID  NODULE, RIGHT 04/21/2009    Past Surgical History:  Procedure Laterality Date   ABDOMINAL HYSTERECTOMY     COLONOSCOPY     HYSTEROSCOPY     LAPAROSCOPY     NOVASURE ABLATION     POLYPECTOMY     TUBAL LIGATION     UPPER GI ENDOSCOPY     VAGINAL HYSTERECTOMY N/A 06/24/2013   Procedure: TOTAL HYSTERECTOMY VAGINAL;  Surgeon: Nena DELENA App, MD;  Location: [facility];  Service: Gynecology;  Laterality: N/A;    Social History   Socioeconomic History   Marital status: Married    Spouse name: Alm   Number of children: 3   Years of education: Not on file   Highest education level: Some college, no degree  Occupational History   Occupation: WOMENS    Employer: [employer]  Tobacco Use   Smoking status: Never    Passive exposure: Past   Smokeless tobacco: Never  Vaping Use   Vaping status: Never Used  Substance and Sexual Activity   Alcohol use: No   Drug use: No   Sexual activity: Yes    Birth control/protection: Surgical    Comment: BTL  Other Topics Concern   Not on file  Social History Narrative   Regular exercise-yes      Patient is right-handed.She lives with her husband in a 1 story home. She has recently been avoiding caffeine. Walks.   Right handed   Does work   Social Drivers of Corporate investment banker Strain: Low Risk  (11/25/2023)   Overall Financial Resource Strain (CARDIA)    Difficulty of Paying Living Expenses: Not very hard  Food Insecurity: No Food Insecurity (03/27/2024)   Hunger Vital Sign    Worried About Running Out of Food in the Last Year: Never true    Ran Out of Food in the Last Year: Never true  Transportation Needs: No Transportation Needs (11/25/2023)   PRAPARE - Scientist, research (physical sciences) (Medical): No    Lack of Transportation (Non-Medical): No  Physical Activity: Insufficiently Active (11/25/2023)   Exercise Vital Sign    Days of Exercise per Week: 1 day    Minutes of Exercise per Session: 20 min  Stress: No Stress Concern Present (11/25/2023)   [employer] of Occupational Health - Occupational Stress Questionnaire    Feeling of Stress : Only a little  Social Connections: Socially Integrated (11/25/2023)   Social Connection and Isolation Panel    Frequency of Communication with Friends and Family: More than three times a week    Frequency of  Social Gatherings with Friends and Family: Once a week    Attends Religious Services: 1 to 4 times per year    Active Member of [employer] or Organizations: Yes    Attends Engineer, structural: 1 to 4 times per year    Marital Status: Married  Catering manager Violence: Not on file    Family History  Problem Relation Age of Onset   Heart disease Mother    Thyroid  disease Mother    Heart attack Mother    Cancer Father    Heart disease Father    Heart attack Father    Throat cancer Father    Lung cancer Father    Alcoholism Father    Graves' disease Sister    Asthma Sister    Hepatitis Sister    Stomach cancer Sister    CVA Brother    Alzheimer's disease Brother    Goiter Brother    Hypertension Brother    Hypertension Brother    Alcoholism Brother    Hyperlipidemia Maternal Aunt    Hypertension Paternal Grandfather    Healthy Daughter    GER disease Son    GER disease Son    Goiter Other        Siblings 2   Depression Neg Hx    Alcohol abuse Neg Hx    Drug abuse Neg Hx    Early death Neg Hx    Hearing loss Neg Hx    Kidney disease Neg Hx    Stroke Neg Hx      Review of Systems  Constitutional:  Positive for malaise/fatigue. Negative for chills and fever.  HENT:  Positive for congestion and sore throat.   Respiratory:  Positive for cough and wheezing.   Cardiovascular: Negative.   Negative for chest pain and palpitations.  Gastrointestinal:  Negative for abdominal pain, diarrhea, nausea and vomiting.  Genitourinary: Negative.  Negative for dysuria and hematuria.  Neurological:  Positive for headaches.  All other systems reviewed and are negative.   Vitals:   05/04/24 1316  BP: 128/72  Pulse: 89  Temp: 98 F (36.7 C)  SpO2: 97%    Physical Exam Vitals reviewed.  Constitutional:      Appearance: Normal appearance.  HENT:     Head: Normocephalic.  Eyes:     Extraocular Movements: Extraocular movements intact.     Pupils: Pupils are equal, round, and reactive to light.  Cardiovascular:     Rate and Rhythm: Normal rate and regular rhythm.     Pulses: Normal pulses.     Heart sounds: Normal heart sounds.  Pulmonary:     Effort: Pulmonary effort is normal.     Breath sounds: Normal breath sounds.  Musculoskeletal:     Cervical back: No tenderness.  Lymphadenopathy:     Cervical: No cervical adenopathy.  Skin:    General: Skin is warm and dry.  Neurological:     Mental Status: She is alert.  Psychiatric:        Mood and Affect: Mood normal.        Behavior: Behavior normal.    Results for orders placed or performed in visit on 05/04/24 (from the past 24 hours)  POC COVID-19     Status: Abnormal   Collection Time: 05/04/24  1:39 PM  Result Value Ref Range   SARS Coronavirus 2 Ag Positive (A) Negative     ASSESSMENT & PLAN: Problem List Items Addressed This Visit  Other   COVID-19 virus infection - Primary   Clinically stable.  No red flag signs or symptoms Running its course without complications No signs of pneumonia Symptom management discussed Advised to rest and stay well-hydrated Off work for 5 days Advised to contact the office if no better or worse during the next several days      Wheezing   Wheezing much improved. No wheezing on physical examination No need for corticosteroids at this time No history of asthma       Relevant Orders   POC COVID-19 (Completed)   Flu-like symptoms   Symptom management discussed Advised to rest and stay well-hydrated Advised to take Mucinex DM over-the-counter as needed Tylenol  and/or Advil  for headaches headaches ED precautions given      Patient Instructions  COVID-19: What to Know COVID-19 is an infection caused by a virus called SARS-CoV-2. This type of virus is called a coronavirus. People with COVID-19 may: Have few to no symptoms. Have mild to moderate symptoms that affect their lungs and breathing. Get very sick. What are the causes?  COVID-19 is caused by a virus. This virus may be in the air as droplets or on surfaces. It can spread from an infected person when they cough, sneeze, speak, sing, or breathe. You may become infected if: You breathe in the infected droplets in the air. You touch an object that has the virus on it. What increases the risk? You are at risk of getting COVID-19 if you have been around someone with the infection. You may be more likely to get very sick if: You are 72 years old or older. You have certain medical conditions, such as: Heart disease. Diabetes. Long-term respiratory disease. Cancer. Pregnancy. You are immunocompromised. This means your body can't fight infections easily. You have a disability that makes it hard for you to move around, you have trouble moving, or you can't move at all. What are the signs or symptoms? People may have different symptoms from COVID-19. The symptoms can also be mild to very bad. They often show up in 5-6 days after being infected. But, they can take up to 14 days to appear. Common symptoms are: Cough. Feeling tired. New loss of taste or smell. Fever. Less common symptoms are: Sore throat. Headache. Body or muscle aches. Diarrhea. A skin rash or fingers or toes that are a different color than usual. Red or irritated eyes. Sometimes, COVID-19 does not cause symptoms. How is  this diagnosed? COVID-19 can be diagnosed with tests done in the lab or at home. Fluid from your nose, mouth, or lungs will be used to check for the virus. How is this treated? Treatment for COVID-19 depends on how sick you are. Mild symptoms can be treated at home with rest, fluids, and over-the-counter medicines. very bad symptoms may be treated in a hospital intensive care unit (ICU). If you have symptoms and are at risk of getting very sick, you may be given a medicine that fights viruses. This medicine is called an antiviral. How is this prevented? To protect yourself from COVID-19: Know your risk factors. Get vaccinated. If your body can't fight infections easily, talk to your provider about treatment to help prevent COVID-19. Stay at least about 3 feet (1 meter) away from other people. Wear mask that fits well when: You can't stay at a distance from people. You're in a place with not a lot of air flow. Try to be in open spaces with good air flow when  you are in public. Wash your hands often or use an alcohol-based hand sanitizer. Cover your nose and mouth when you cough or sneeze. If you think you have COVID-19 or have been around someone who has it, stay home and away from other people as told by your provider or health officials. Where to find more information To learn more: Go to [employer]erProm[employer].no Click Health Topics. Type COVID-19 in the search box. Go to VisitDestination.com.br Click Health Topics. Then click All Topics. Type COVID-19 in the search box. Get help right away if: You have trouble breathing or get short of breath. You have pain or pressure in your chest. You're feeling confused. These symptoms may be an emergency. Get help right away. Call 911. Do not wait to see if the symptoms will go away. Do not drive yourself to the hospital. This information is not intended to replace advice given to you by your health care provider. Make sure you discuss any questions you have with  your health care provider. Document Revised: 05/30/2023 Document Reviewed: 05/22/2023 Elsevier Patient Education  2025 Elsevier Inc.     Emil Schaumann, MD [facility]

## 2024-05-04 NOTE — Assessment & Plan Note (Signed)
 Clinically stable.  No red flag signs or symptoms Running its course without complications No signs of pneumonia Symptom management discussed Advised to rest and stay well-hydrated Off work for 5 days Advised to contact the office if no better or worse during the next several days

## 2024-05-04 NOTE — Assessment & Plan Note (Signed)
 Symptom management discussed Advised to rest and stay well-hydrated Advised to take Mucinex DM over-the-counter as needed Tylenol  and/or Advil  for headaches headaches ED precautions given

## 2024-05-04 NOTE — Patient Instructions (Signed)
 COVID-19: What to Know COVID-19 is an infection caused by a virus called SARS-CoV-2. This type of virus is called a coronavirus. People with COVID-19 may: Have few to no symptoms. Have mild to moderate symptoms that affect their lungs and breathing. Get very sick. What are the causes?  COVID-19 is caused by a virus. This virus may be in the air as droplets or on surfaces. It can spread from an infected person when they cough, sneeze, speak, sing, or breathe. You may become infected if: You breathe in the infected droplets in the air. You touch an object that has the virus on it. What increases the risk? You are at risk of getting COVID-19 if you have been around someone with the infection. You may be more likely to get very sick if: You are 57 years old or older. You have certain medical conditions, such as: Heart disease. Diabetes. Long-term respiratory disease. Cancer. Pregnancy. You are immunocompromised. This means your body can't fight infections easily. You have a disability that makes it hard for you to move around, you have trouble moving, or you can't move at all. What are the signs or symptoms? People may have different symptoms from COVID-19. The symptoms can also be mild to very bad. They often show up in 5-6 days after being infected. But, they can take up to 14 days to appear. Common symptoms are: Cough. Feeling tired. New loss of taste or smell. Fever. Less common symptoms are: Sore throat. Headache. Body or muscle aches. Diarrhea. A skin rash or fingers or toes that are a different color than usual. Red or irritated eyes. Sometimes, COVID-19 does not cause symptoms. How is this diagnosed? COVID-19 can be diagnosed with tests done in the lab or at home. Fluid from your nose, mouth, or lungs will be used to check for the virus. How is this treated? Treatment for COVID-19 depends on how sick you are. Mild symptoms can be treated at home with rest, fluids, and  over-the-counter medicines. very bad symptoms may be treated in a hospital intensive care unit (ICU). If you have symptoms and are at risk of getting very sick, you may be given a medicine that fights viruses. This medicine is called an antiviral. How is this prevented? To protect yourself from COVID-19: Know your risk factors. Get vaccinated. If your body can't fight infections easily, talk to your provider about treatment to help prevent COVID-19. Stay at least about 3 feet (1 meter) away from other people. Wear mask that fits well when: You can't stay at a distance from people. You're in a place with not a lot of air flow. Try to be in open spaces with good air flow when you are in public. Wash your hands often or use an alcohol-based hand sanitizer. Cover your nose and mouth when you cough or sneeze. If you think you have COVID-19 or have been around someone who has it, stay home and away from other people as told by your provider or health officials. Where to find more information To learn more: Go to TonerPromos.no Click Health Topics. Type COVID-19 in the search box. Go to VisitDestination.com.br Click Health Topics. Then click All Topics. Type COVID-19 in the search box. Get help right away if: You have trouble breathing or get short of breath. You have pain or pressure in your chest. You're feeling confused. These symptoms may be an emergency. Get help right away. Call 911. Do not wait to see if the symptoms will go away.  Do not drive yourself to the hospital. This information is not intended to replace advice given to you by your health care provider. Make sure you discuss any questions you have with your health care provider. Document Revised: 05/30/2023 Document Reviewed: 05/22/2023 Elsevier Patient Education  2025 [employer].

## 2024-05-08 ENCOUNTER — Ambulatory Visit: Admitting: Dietician

## 2024-05-18 ENCOUNTER — Encounter: Payer: Self-pay | Admitting: Emergency Medicine

## 2024-05-18 ENCOUNTER — Ambulatory Visit (INDEPENDENT_AMBULATORY_CARE_PROVIDER_SITE_OTHER): Payer: Commercial Managed Care - PPO | Admitting: Emergency Medicine

## 2024-05-18 VITALS — BP 124/68 | HR 82 | Temp 98.4°F | Ht 60.0 in | Wt 146.0 lb

## 2024-05-18 DIAGNOSIS — E039 Hypothyroidism, unspecified: Secondary | ICD-10-CM

## 2024-05-18 DIAGNOSIS — Z1329 Encounter for screening for other suspected endocrine disorder: Secondary | ICD-10-CM | POA: Diagnosis not present

## 2024-05-18 DIAGNOSIS — Z0000 Encounter for general adult medical examination without abnormal findings: Secondary | ICD-10-CM

## 2024-05-18 DIAGNOSIS — Z13228 Encounter for screening for other metabolic disorders: Secondary | ICD-10-CM | POA: Diagnosis not present

## 2024-05-18 DIAGNOSIS — Z130 Encounter for screening for diseases of the blood and blood-forming organs and certain disorders involving the immune mechanism: Secondary | ICD-10-CM | POA: Diagnosis not present

## 2024-05-18 DIAGNOSIS — E785 Hyperlipidemia, unspecified: Secondary | ICD-10-CM | POA: Diagnosis not present

## 2024-05-18 DIAGNOSIS — R7303 Prediabetes: Secondary | ICD-10-CM | POA: Diagnosis not present

## 2024-05-18 DIAGNOSIS — K219 Gastro-esophageal reflux disease without esophagitis: Secondary | ICD-10-CM | POA: Diagnosis not present

## 2024-05-18 DIAGNOSIS — R1011 Right upper quadrant pain: Secondary | ICD-10-CM

## 2024-05-18 DIAGNOSIS — Z0001 Encounter for general adult medical examination with abnormal findings: Secondary | ICD-10-CM

## 2024-05-18 LAB — COMPREHENSIVE METABOLIC PANEL WITH GFR
ALT: 19 U/L (ref 0–35)
AST: 18 U/L (ref 0–37)
Albumin: 4.3 g/dL (ref 3.5–5.2)
Alkaline Phosphatase: 93 U/L (ref 39–117)
BUN: 10 mg/dL (ref 6–23)
CO2: 28 meq/L (ref 19–32)
Calcium: 10.1 mg/dL (ref 8.4–10.5)
Chloride: 103 meq/L (ref 96–112)
Creatinine, Ser: 0.94 mg/dL (ref 0.40–1.20)
GFR: 66.67 mL/min (ref >60.00)
Glucose, Bld: 76 mg/dL (ref 70–99)
Potassium: 3.5 meq/L (ref 3.5–5.1)
Sodium: 139 meq/L (ref 135–145)
Total Bilirubin: 0.3 mg/dL (ref 0.2–1.2)
Total Protein: 7.8 g/dL (ref 6.0–8.3)

## 2024-05-18 LAB — LIPID PANEL
Cholesterol: 238 mg/dL — ABNORMAL HIGH (ref 0–200)
HDL: 65.4 mg/dL (ref >39.00)
LDL Cholesterol: 155 mg/dL — ABNORMAL HIGH (ref 0–99)
NonHDL: 172.63
Total CHOL/HDL Ratio: 4
Triglycerides: 86 mg/dL (ref 0.0–149.0)
VLDL: 17.2 mg/dL (ref 0.0–40.0)

## 2024-05-18 LAB — CBC WITH DIFFERENTIAL/PLATELET
Basophils Absolute: 0 K/uL (ref 0.0–0.1)
Basophils Relative: 0.5 % (ref 0.0–3.0)
Eosinophils Absolute: 0.2 K/uL (ref 0.0–0.7)
Eosinophils Relative: 4.7 % (ref 0.0–5.0)
HCT: 39.1 % (ref 36.0–46.0)
Hemoglobin: 13 g/dL (ref 12.0–15.0)
Lymphocytes Relative: 44.3 % (ref 12.0–46.0)
Lymphs Abs: 2.3 K/uL (ref 0.7–4.0)
MCHC: 33.2 g/dL (ref 30.0–36.0)
MCV: 91.1 fl (ref 78.0–100.0)
Monocytes Absolute: 0.3 K/uL (ref 0.1–1.0)
Monocytes Relative: 6 % (ref 3.0–12.0)
Neutro Abs: 2.3 K/uL (ref 1.4–7.7)
Neutrophils Relative %: 44.5 % (ref 43.0–77.0)
Platelets: 393 K/uL (ref 150.0–400.0)
RBC: 4.29 Mil/uL (ref 3.87–5.11)
RDW: 13.8 % (ref 11.5–15.5)
WBC: 5.1 K/uL (ref 4.0–10.5)

## 2024-05-18 LAB — HEMOGLOBIN A1C: Hgb A1c MFr Bld: 6.6 % — ABNORMAL HIGH (ref 4.6–6.5)

## 2024-05-18 LAB — TSH: TSH: 6.28 u[IU]/mL — ABNORMAL HIGH (ref 0.35–5.50)

## 2024-05-18 NOTE — Assessment & Plan Note (Signed)
Diet and nutrition discussed.  Hemoglobin A1c done today. 

## 2024-05-18 NOTE — Progress Notes (Signed)
 Chelsey Hunt 58 y.o.   Chief Complaint  Patient presents with   Annual Exam    Patient here for physical.     HISTORY OF PRESENT ILLNESS: This is a 58 y.o. female A1A here for annual exam. Complaining of intermittent pains to right upper quadrant for the past couple of months No other associated symptoms Other complaints or medical concerns today.  HPI   Prior to Admission medications   Medication Sig Start Date End Date Taking? Authorizing Provider  acetaminophen  (TYLENOL ) 325 MG tablet Take 650 mg by mouth every 6 (six) hours as needed for mild pain.   Yes [provider]  Cholecalciferol (D3 2000 PO) Take by mouth.   Yes [provider]  cyclobenzaprine  (FLEXERIL ) 5 MG tablet Take 1 tablet (5 mg total) by mouth 3 (three) times daily as needed for muscle spasms. 12/23/23  Yes Patel, Donika K, DO  dicyclomine  (BENTYL ) 10 MG capsule Take 1 capsule (10 mg total) by mouth 3 (three) times daily as needed. 01/24/24  Yes   ezetimibe  (ZETIA ) 10 MG tablet Take 1 tablet (10 mg total) by mouth every evening. 05/14/23  Yes Aswad Wandrey, Emil Schanz, MD  ibuprofen  (ADVIL ,MOTRIN ) 200 MG tablet Take 200 mg by mouth every 8 (eight) hours as needed.   Yes [provider]  levothyroxine  (SYNTHROID ) 75 MCG tablet Take 1 tablet (75 mcg total) by mouth daily. 05/14/23  Yes Josiane Labine, Emil Schanz, MD  magnesium 30 MG tablet Take 30 mg by mouth daily.   Yes [provider]  pantoprazole  (PROTONIX ) 20 MG tablet Take 1 tablet (20 mg total) by mouth before breakfast and before evening meal. 01/24/24  Yes   promethazine  (PHENERGAN ) 25 MG tablet Take 1 tablet (25 mg total) by mouth every 6 (six) hours as needed for nausea or vomiting. 04/08/24  Yes Jaffe, Adam R, DO  Rimegepant Sulfate  (NURTEC) 75 MG TBDP Take 1 tablet (75 mg total) by mouth every other day. 10/09/23  Yes Jaffe, Adam R, DO  Ubrogepant  (UBRELVY ) 100 MG TABS Take 1 tablet (100 mg) by mouth as needed. May repeat dose  in 2 hours.  Maximum 2 tablets in 24 hours. 10/09/23  Yes Jaffe, Adam R, DO  valACYclovir  (VALTREX ) 1000 MG tablet Take 1 tablet (1,000 mg total) by mouth every 12 (twelve) hours for 5 days. 10/01/23  Yes     Allergies  Allergen Reactions   Imitrex  [Sumatriptan ] Palpitations   Reglan  [Metoclopramide ] Palpitations   Rosuvastatin  Other (See Comments)    Arthralgia and myalgia   Propoxyphene N-Acetaminophen     Nabumetone Rash    REACTION: GI upset   Topiramate Rash    REACTION: rash    Patient Active Problem List   Diagnosis Date Noted   Overweight 02/17/2024   Chronic right shoulder pain 11/26/2023   Cervical radiculopathy 04/10/2022   Statin intolerance 03/08/2022   Family history of heart disease 11/15/2021   Obstructive sleep apnea 09/07/2021   Prediabetes 09/07/2021   Dyslipidemia 08/14/2021   Positive ANA (antinuclear antibody) 04/20/2021   Finger deformity 04/20/2021   Raynaud phenomenon 09/17/2019   Anxiety 07/30/2019   Chronic idiopathic constipation 01/08/2019   Psychophysiological insomnia 07/07/2018   Intractable migraine without aura and without status migrainosus 07/02/2016   Pancreas divisum of native pancreas 12/27/2014   Uterine leiomyoma 09/26/2012   GERD (gastroesophageal reflux disease)    Hypothyroidism 08/12/2009    Past Medical History:  Diagnosis Date   ACID REFLUX DISEASE 01/03/2008   Anemia  ANEMIA, IRON DEFICIENCY 12/23/2008   history   Anxiety    Chronic tension type headache 01/13/2009   migraines   Complication of anesthesia    pt states she just needs a small amount or she will sleep too long   Dysplasia of cervix, low grade (CIN 1)    Fibroid    GERD (gastroesophageal reflux disease)    HEART MURMUR, SYSTOLIC 07/07/2010   will have echo 06/18/2013   HYPOTHYROIDISM, POST-RADIATION 08/12/2009   Irritable bowel syndrome 12/23/2008   Migraine    Ovarian cyst    Ovarian cyst    THYROID  NODULE, RIGHT 04/21/2009    Past Surgical History:   Procedure Laterality Date   ABDOMINAL HYSTERECTOMY     COLONOSCOPY     HYSTEROSCOPY     LAPAROSCOPY     NOVASURE ABLATION     POLYPECTOMY     TUBAL LIGATION     UPPER GI ENDOSCOPY     VAGINAL HYSTERECTOMY N/A 06/24/2013   Procedure: TOTAL HYSTERECTOMY VAGINAL;  Surgeon: Nena DELENA App, MD;  Location: [facility];  Service: Gynecology;  Laterality: N/A;    Social History   Socioeconomic History   Marital status: Married    Spouse name: Alm   Number of children: 3   Years of education: Not on file   Highest education level: Associate degree: occupational, Scientist, product/process development, or vocational program  Occupational History   Occupation: WOMENS    Employer: [employer]  Tobacco Use   Smoking status: Never    Passive exposure: Past   Smokeless tobacco: Never  Vaping Use   Vaping status: Never Used  Substance and Sexual Activity   Alcohol use: No   Drug use: No   Sexual activity: Yes    Birth control/protection: Surgical    Comment: BTL  Other Topics Concern   Not on file  Social History Narrative   Regular exercise-yes      Patient is right-handed.She lives with her husband in a 1 story home. She has recently been avoiding caffeine. Walks.   Right handed   Does work   Social Drivers of Corporate investment banker Strain: Low Risk  (05/18/2024)   Overall Financial Resource Strain (CARDIA)    Difficulty of Paying Living Expenses: Not very hard  Food Insecurity: No Food Insecurity (05/18/2024)   Hunger Vital Sign    Worried About Running Out of Food in the Last Year: Never true    Ran Out of Food in the Last Year: Never true  Transportation Needs: No Transportation Needs (05/18/2024)   PRAPARE - Administrator, Civil Service (Medical): No    Lack of Transportation (Non-Medical): No  Physical Activity: Sufficiently Active (05/18/2024)   Exercise Vital Sign    Days of Exercise per Week: 2 days    Minutes of Exercise per Session: 90 min  Stress: Stress Concern Present  (05/18/2024)   [employer] of Occupational Health - Occupational Stress Questionnaire    Feeling of Stress: To some extent  Social Connections: Socially Integrated (05/18/2024)   Social Connection and Isolation Panel    Frequency of Communication with Friends and Family: More than three times a week    Frequency of Social Gatherings with Friends and Family: Once a week    Attends Religious Services: More than 4 times per year    Active Member of [employer] or Organizations: Yes    Attends Banker Meetings: 1 to 4 times per year    Marital  Status: Married  Catering manager Violence: Not on file    Family History  Problem Relation Age of Onset   Heart disease Mother    Thyroid  disease Mother    Heart attack Mother    Cancer Father    Heart disease Father    Heart attack Father    Throat cancer Father    Lung cancer Father    Alcoholism Father    Graves' disease Sister    Asthma Sister    Hepatitis Sister    Stomach cancer Sister    CVA Brother    Alzheimer's disease Brother    Goiter Brother    Hypertension Brother    Hypertension Brother    Alcoholism Brother    Hyperlipidemia Maternal Aunt    Hypertension Paternal Grandfather    Healthy Daughter    GER disease Son    GER disease Son    Goiter Other        Siblings 2   Depression Neg Hx    Alcohol abuse Neg Hx    Drug abuse Neg Hx    Early death Neg Hx    Hearing loss Neg Hx    Kidney disease Neg Hx    Stroke Neg Hx      Review of Systems  Constitutional: Negative.  Negative for chills and fever.  HENT: Negative.  Negative for congestion and sore throat.   Respiratory: Negative.  Negative for cough and shortness of breath.   Cardiovascular: Negative.  Negative for chest pain and palpitations.  Gastrointestinal:  Positive for abdominal pain. Negative for nausea and vomiting.  Genitourinary: Negative.  Negative for dysuria and hematuria.  Skin: Negative.  Negative for rash.  Neurological: Negative.   Negative for dizziness and headaches.  All other systems reviewed and are negative.   Today's Vitals   05/18/24 1343  BP: 124/68  Pulse: 82  Temp: 98.4 F (36.9 C)  TempSrc: Oral  SpO2: 99%  Weight: 146 lb (66.2 kg)  Height: 5' (1.524 m)   Body mass index is 28.51 kg/m.   Physical Exam Vitals reviewed.  Constitutional:      Appearance: Normal appearance.  HENT:     Head: Normocephalic.     Right Ear: Tympanic membrane, ear canal and external ear normal.     Left Ear: Tympanic membrane, ear canal and external ear normal.     Mouth/Throat:     Mouth: Mucous membranes are moist.     Pharynx: Oropharynx is clear.  Eyes:     Extraocular Movements: Extraocular movements intact.     Pupils: Pupils are equal, round, and reactive to light.  Cardiovascular:     Rate and Rhythm: Normal rate and regular rhythm.     Pulses: Normal pulses.     Heart sounds: Normal heart sounds.  Pulmonary:     Effort: Pulmonary effort is normal.     Breath sounds: Normal breath sounds.  Abdominal:     Palpations: Abdomen is soft.     Tenderness: There is no abdominal tenderness.  Musculoskeletal:     Cervical back: No tenderness.  Lymphadenopathy:     Cervical: No cervical adenopathy.  Skin:    General: Skin is warm and dry.     Capillary Refill: Capillary refill takes less than 2 seconds.  Neurological:     General: No focal deficit present.     Mental Status: She is alert and oriented to person, place, and time.  Psychiatric:  Mood and Affect: Mood normal.        Behavior: Behavior normal.      ASSESSMENT & PLAN: Problem List Items Addressed This Visit       Digestive   GERD (gastroesophageal reflux disease)   Stable.  Takes Protonix  20 mg daily as needed        Endocrine   Hypothyroidism   Clinically euthyroid TSH done today Continue Synthroid  75 mcg daily Will adjust dose accordingly depending on level      Relevant Orders   TSH     Other   Right upper  quadrant abdominal pain   Clinically stable.  Unremarkable examination today Differential diagnosis including possibility of gallstones discussed Recommend gallbladder ultrasound and blood work Diet and nutrition discussed      Relevant Orders   US  Abdomen Limited RUQ (LIVER/GB)   Comprehensive metabolic panel with GFR   CBC with Differential/Platelet   Dyslipidemia   Chronic stable condition. Intolerant to statins Continue Zetia  10 mg daily Lipid profile done today      Relevant Orders   Lipid panel   Prediabetes   Diet and nutrition discussed Hemoglobin A1c done today      Relevant Orders   Hemoglobin A1c   Other Visit Diagnoses       Encounter for general adult medical examination with abnormal findings    -  Primary   Relevant Orders   TSH   Lipid panel   Hemoglobin A1c   Comprehensive metabolic panel with GFR   CBC with Differential/Platelet     Screening for deficiency anemia       Relevant Orders   CBC with Differential/Platelet     Screening for endocrine, metabolic and immunity disorder       Relevant Orders   Comprehensive metabolic panel with GFR      Modifiable risk factors discussed with patient. Anticipatory guidance according to age provided. The following topics were also discussed: Social Determinants of Health Smoking.  Non-smoker Diet and nutrition Benefits of exercise Cancer screening and review of most recent mammogram report and colonoscopy report from 2017 Vaccinations review and recommendations Cardiovascular risk assessment and need for blood work The 10-year ASCVD risk score (Arnett DK, et al., 2019) is: 4%   Values used to calculate the score:     Age: 62 years     Clincally relevant sex: Female     Is Non-Hispanic African American: Yes     Diabetic: No     Tobacco smoker: No     Systolic Blood Pressure: 124 mmHg     Is BP treated: No     HDL Cholesterol: 65.4 mg/dL     Total Cholesterol: 238 mg/dL Review of chronic medical  conditions and their management Review of all medications Mental health including depression and anxiety Fall and accident prevention  Patient Instructions  Health Maintenance, Female Adopting a healthy lifestyle and getting preventive care are important in promoting health and wellness. Ask your health care provider about: The right schedule for you to have regular tests and exams. Things you can do on your own to prevent diseases and keep yourself healthy. What should I know about diet, weight, and exercise? Eat a healthy diet  Eat a diet that includes plenty of vegetables, fruits, low-fat dairy products, and lean protein. Do not eat a lot of foods that are high in solid fats, added sugars, or sodium. Maintain a healthy weight Body mass index (BMI) is used to identify weight problems.  It estimates body fat based on height and weight. Your health care provider can help determine your BMI and help you achieve or maintain a healthy weight. Get regular exercise Get regular exercise. This is one of the most important things you can do for your health. Most adults should: Exercise for at least 150 minutes each week. The exercise should increase your heart rate and make you sweat (moderate-intensity exercise). Do strengthening exercises at least twice a week. This is in addition to the moderate-intensity exercise. Spend less time sitting. Even light physical activity can be beneficial. Watch cholesterol and blood lipids Have your blood tested for lipids and cholesterol at 59 years of age, then have this test every 5 years. Have your cholesterol levels checked more often if: Your lipid or cholesterol levels are high. You are older than 59 years of age. You are at high risk for heart disease. What should I know about cancer screening? Depending on your health history and family history, you may need to have cancer screening at various ages. This may include screening for: Breast  cancer. Cervical cancer. Colorectal cancer. Skin cancer. Lung cancer. What should I know about heart disease, diabetes, and high blood pressure? Blood pressure and heart disease High blood pressure causes heart disease and increases the risk of stroke. This is more likely to develop in people who have high blood pressure readings or are overweight. Have your blood pressure checked: Every 3-5 years if you are 5-14 years of age. Every year if you are 26 years old or older. Diabetes Have regular diabetes screenings. This checks your fasting blood sugar level. Have the screening done: Once every three years after age 6 if you are at a normal weight and have a low risk for diabetes. More often and at a younger age if you are overweight or have a high risk for diabetes. What should I know about preventing infection? Hepatitis B If you have a higher risk for hepatitis B, you should be screened for this virus. Talk with your health care provider to find out if you are at risk for hepatitis B infection. Hepatitis C Testing is recommended for: Everyone born from [birth year] through 1966. Anyone with known risk factors for hepatitis C. Sexually transmitted infections (STIs) Get screened for STIs, including gonorrhea and chlamydia, if: You are sexually active and are younger than 59 years of age. You are older than 59 years of age and your health care provider tells you that you are at risk for this type of infection. Your sexual activity has changed since you were last screened, and you are at increased risk for chlamydia or gonorrhea. Ask your health care provider if you are at risk. Ask your health care provider about whether you are at high risk for HIV. Your health care provider may recommend a prescription medicine to help prevent HIV infection. If you choose to take medicine to prevent HIV, you should first get tested for HIV. You should then be tested every 3 months for as long as you are taking  the medicine. Pregnancy If you are about to stop having your period (premenopausal) and you may become pregnant, seek counseling before you get pregnant. Take 400 to 800 micrograms (mcg) of folic acid  every day if you become pregnant. Ask for birth control (contraception) if you want to prevent pregnancy. Osteoporosis and menopause Osteoporosis is a disease in which the bones lose minerals and strength with aging. This can result in bone fractures. If you are  50 years old or older, or if you are at risk for osteoporosis and fractures, ask your health care provider if you should: Be screened for bone loss. Take a calcium  or vitamin D  supplement to lower your risk of fractures. Be given hormone replacement therapy (HRT) to treat symptoms of menopause. Follow these instructions at home: Alcohol use Do not drink alcohol if: Your health care provider tells you not to drink. You are pregnant, may be pregnant, or are planning to become pregnant. If you drink alcohol: Limit how much you have to: 0-1 drink a day. Know how much alcohol is in your drink. In the [place] one drink equals one 12 oz bottle of beer (355 mL), one 5 oz glass of wine (148 mL), or one 1 oz glass of hard liquor (44 mL). Lifestyle Do not use any products that contain nicotine or tobacco. These products include cigarettes, chewing tobacco, and vaping devices, such as e-cigarettes. If you need help quitting, ask your health care provider. Do not use street drugs. Do not share needles. Ask your health care provider for help if you need support or information about quitting drugs. General instructions Schedule regular health, dental, and eye exams. Stay current with your vaccines. Tell your health care provider if: You often feel depressed. You have ever been abused or do not feel safe at home. Summary Adopting a healthy lifestyle and getting preventive care are important in promoting health and wellness. Follow your health  care provider's instructions about healthy diet, exercising, and getting tested or screened for diseases. Follow your health care provider's instructions on monitoring your cholesterol and blood pressure. This information is not intended to replace advice given to you by your health care provider. Make sure you discuss any questions you have with your health care provider. Document Revised: 01/16/2021 Document Reviewed: 01/16/2021 Elsevier Patient Education  2024 Elsevier Inc.     Emil Schaumann, MD [facility]

## 2024-05-18 NOTE — Assessment & Plan Note (Signed)
Stable.  Takes Protonix 20 mg daily as needed

## 2024-05-18 NOTE — Assessment & Plan Note (Signed)
Chronic stable condition. Intolerant to statins Continue Zetia 10 mg daily Lipid profile done today

## 2024-05-18 NOTE — Assessment & Plan Note (Signed)
Clinically euthyroid TSH done today Continue Synthroid 75 mcg daily Will adjust dose accordingly depending on level

## 2024-05-18 NOTE — Assessment & Plan Note (Signed)
 Clinically stable.  Unremarkable examination today Differential diagnosis including possibility of gallstones discussed Recommend gallbladder ultrasound and blood work Diet and nutrition discussed

## 2024-05-18 NOTE — Patient Instructions (Signed)

## 2024-05-19 ENCOUNTER — Other Ambulatory Visit (HOSPITAL_COMMUNITY): Payer: Self-pay

## 2024-05-19 ENCOUNTER — Ambulatory Visit: Payer: Self-pay | Admitting: Emergency Medicine

## 2024-05-19 DIAGNOSIS — E1169 Type 2 diabetes mellitus with other specified complication: Secondary | ICD-10-CM | POA: Insufficient documentation

## 2024-05-19 MED ORDER — EMPAGLIFLOZIN 10 MG PO TABS
10.0000 mg | ORAL_TABLET | Freq: Every day | ORAL | 3 refills | Status: AC
  Filled 2024-05-19: qty 90, 90d supply, fill #0
  Filled 2024-08-12: qty 90, 90d supply, fill #1
  Filled 2024-08-12: qty 90, 90d supply, fill #0

## 2024-05-19 NOTE — Telephone Encounter (Signed)
 Will respond after I look at the results

## 2024-05-20 ENCOUNTER — Other Ambulatory Visit: Payer: Self-pay | Admitting: Emergency Medicine

## 2024-05-20 ENCOUNTER — Ambulatory Visit
Admission: RE | Admit: 2024-05-20 | Discharge: 2024-05-20 | Disposition: A | Discharge status: Home / Self Care | Source: Ambulatory Visit | Attending: Emergency Medicine | Admitting: Emergency Medicine

## 2024-05-20 ENCOUNTER — Other Ambulatory Visit (HOSPITAL_COMMUNITY): Payer: Self-pay

## 2024-05-20 DIAGNOSIS — R1011 Right upper quadrant pain: Secondary | ICD-10-CM

## 2024-05-20 DIAGNOSIS — E039 Hypothyroidism, unspecified: Secondary | ICD-10-CM

## 2024-05-20 MED ORDER — LEVOTHYROXINE SODIUM 100 MCG PO TABS
100.0000 ug | ORAL_TABLET | Freq: Every day | ORAL | 3 refills | Status: DC
  Filled 2024-05-20: qty 90, 90d supply, fill #0

## 2024-05-20 NOTE — Telephone Encounter (Signed)
 Recommend to increase to 100 mcg daily.  New prescription sent to pharmacy of record today.

## 2024-05-21 ENCOUNTER — Other Ambulatory Visit (HOSPITAL_COMMUNITY): Payer: Self-pay

## 2024-05-27 ENCOUNTER — Telehealth: Payer: Self-pay | Admitting: *Deleted

## 2024-05-27 NOTE — Progress Notes (Unsigned)
 Care Guide Pharmacy Note  05/27/2024 Name: Chelsey Hunt MRN: [id number] DOB: 1966/02/19  Referred By: Purcell Emil Schanz, MD Reason for referral: Call Attempt #1 and Complex Care Management (Outreach to schedule referral with pharmacist )   Chelsey Hunt is a 58 y.o. year old female who is a primary care patient of Purcell, Emil Schanz, MD.  [name] was referred to the pharmacist for assistance related to: HLD  An unsuccessful telephone outreach was attempted today to contact the patient who was referred to the pharmacy team for assistance with medication management. Additional attempts will be made to contact the patient.  Thedford Franks, CMA [facility]  [facility] Guide Direct Dial: [phone]  Fax: [phone] Website: [email or web address]

## 2024-05-28 ENCOUNTER — Encounter: Attending: Emergency Medicine | Admitting: Dietician

## 2024-05-28 ENCOUNTER — Encounter: Payer: Self-pay | Admitting: Dietician

## 2024-05-28 VITALS — Wt 148.5 lb

## 2024-05-28 DIAGNOSIS — E663 Overweight: Secondary | ICD-10-CM | POA: Insufficient documentation

## 2024-05-28 NOTE — Progress Notes (Signed)
 Medical Nutrition Therapy  Appointment Start time:  1530  Appointment End time:  1600  Primary concerns today: eat healthier, curb cravings   Referral diagnosis: E66.3; Employee Visit 2 Preferred learning style: no preference indicated Learning readiness: ready   NUTRITION ASSESSMENT   Anthropometrics  Ht: 60 in Wt 05/28/24: 148.5 lb Wt 03/27/24: 148.8 lb  Clinical Medical Hx: anemia, anxiety, GERD, heart murmur, thyroid  disease, prediabetes, HLD, acid reflux, IBS-C Medications: levothyroxine , jardiance  Labs: 05/18/24: A1c 6.6%, chol 238, LDL 155 Notable Signs/Symptoms: constipation Food Allergies: none reported; lactose intolerant  Lifestyle & Dietary Hx  Pt reports she recently had an A1c of 6.6% and felt distraught about it. Pt states she has not been diagnosed with diabetes. Pt reports she wants to work to bring A1c back into prediabetes range.   Pt reports she has continued to have life stressors. Pt reports she is working less, but work schedule still flips day/night, making sleep routine poor.   Pt reports she was having issues with her thyroid  recently.   Pt states she has been staying away from fried foods. Pt reports she is now drinking 64 oz water daily.   Pt states she started going to [employer] 2 days per week for an hour. Pt reports she does 5 miles on the stationary bike followed by weights.   Estimated daily fluid intake: 64 oz Supplements: magnesium, vitamin D ,  Sleep: if not working 10 hours, if working 2-5 hours Stress / self-care: 'very high' pt report Current average weekly physical activity: working out 2 days per week for 1 hour.   24-Hr Dietary Recall First Meal: banana and 2 boiled eggs OR oatmeal with blueberries, cranberries, butter, honey Snack: lentil chips Second Meal: apple and nuts Snack: none Third Meal: salmon with green beans and sweet potato Snack: none Beverages: decaf coffee, regular coffee occasionally, water, ginger tea with  2-3 tsp honey   NUTRITION DIAGNOSIS  NB-1.1 Food and nutrition-related knowledge deficit As related to lack of prior education by a registered dietitian.  As evidenced by pt report.   NUTRITION INTERVENTION  Nutrition education (E-1) on the following topics:   Prediabetes: Prediabetes is a condition where blood sugar levels are higher than normal but not yet high enough to be diagnosed as type 2 diabetes. A1C, or hemoglobin A1c, is a blood test that provides an average of a person's blood sugar levels over the past two to three months. It is commonly used to diagnose and monitor diabetes. For prediabetes, an A1C level between 5.7% and 6.4% typically is used to diagnose this. Here is how the A1C levels are generally categorized: Normal:  A1C below 5.7% Prediabetes:  A1C between 5.7% and 6.4% Diabetes:  A1C of 6.5% or higher When diagnosed with prediabetes, there are several lifestyle changes you can make to manage the condition: Healthy Eating:  Follow a well-balanced diet that includes a variety of fruits, vegetables, whole grains, lean proteins, and healthy fats. Monitor portion sizes and reduce intake of sugary and processed foods. Regular Physical Activity:  Engage in regular physical activity, such as brisk walking, cycling, or other aerobic exercises, for at least 150 minutes per week. Include strength training exercises at least twice a week. Weight Management: Achieve and maintain a healthy weight. Losing even a small amount of weight (3-5%) can significantly improve insulin sensitivity.  Food Groups Fruits & Vegetables: Aim to fill half your plate with a variety of fruits and vegetables. They are rich in vitamins, minerals, and fiber,  and can help reduce the risk of chronic diseases. Choose a colorful assortment of fruits and vegetables to ensure you get a wide range of nutrients. Grains and Starches: Make at least half of your grain choices whole grains, such as brown rice, whole  wheat bread, and oats. Whole grains provide fiber, which aids in digestion and healthy cholesterol levels. Aim for whole forms of starchy vegetables such as potatoes, sweet potatoes, beans, peas, and corn, which are fiber rich and provide many vitamins and minerals.  Protein: Incorporate lean sources of protein, such as poultry, fish, beans, nuts, and seeds, into your meals. Protein is essential for building and repairing tissues, staying full, balancing blood sugar, as well as supporting immune function. Dairy: Include low-fat or fat-free dairy products like milk, yogurt, and cheese in your diet. Dairy foods are excellent sources of calcium  and vitamin D , which are crucial for bone health.   Stress and GI Impact on Nutrition:    - Stress and anxiety can disrupt eating habits, leading to emotional eating (overeating comfort foods) or a loss of appetite (skipping meals or under-eating). Both can negatively impact nutrition, causing weight gain or nutrient deficiencies.    - Anxiety often triggers cravings for unhealthy foods like sweets and salty snacks, which provide quick energy but lack essential nutrients, affecting overall health and nutrition.    - Stress can also cause changes in eating patterns, such as eating too quickly or overeating, which can result in bloating, indigestion, or other GI discomforts.  Exercise Finding an exercise you enjoy is crucial for maintaining long-term fitness and overall health. Enjoyable activities are more likely to become regular habits, making it easier to stay consistent with physical activity. When you look forward to your workouts, exercise becomes a positive experience rather than a chore, reducing the likelihood of burnout or quitting. Enjoyable exercise also enhances mental well-being, as engaging in activities you love can boost mood, reduce stress, and provide a sense of accomplishment. Aim for 150 minutes of physical activity weekly. Make physical activity a  part of your week. Try to include at least 30 minutes of physical activity 5 days each week or at least 150 minutes per week.  Handouts Provided Include (initial assessment) Plate Method Snack Sheet  Learning Style & Readiness for Change Teaching method utilized: Visual & Auditory  Demonstrated degree of understanding via: Teach Back  Barriers to learning/adherence to lifestyle change: none  Assessment/Continuation of Previous Goals Established by Pt  Goal 1: go to the gym 2 days a week for at least 1 hour. - goal met, continue!  Goal 2: during snacks aim to incorporate a carb + protein (examples: tuna and triscuits, apple and peanut butter, fruit and nuts, etc). - goal in progress, continue!  Goal 3: cook at home at least 2 nights per week (aim to include 1/2 plate non-starchy vegetables, 1/4 plate protein, and 1/4 plate complex carbs). - been doing 1 day per week, continue working toward goal.   New Goal:    Goal 1: keep going to the gym 2x/wk, add a 15-30 minute walk 3 times a week.    MONITORING & EVALUATION Dietary intake, weekly physical activity, and follow up in 3 months.  Next Steps  Patient is to call for questions.

## 2024-05-28 NOTE — Progress Notes (Signed)
 Care Guide Pharmacy Note  05/28/2024 Name: Gladis Soley MRN: [id number] DOB: 1966-07-24  Referred By: Purcell Emil Schanz, MD Reason for referral: Call Attempt #1 and Complex Care Management (Outreach to schedule referral with pharmacist )   Yazmen Briones is a 58 y.o. year old female who is a primary care patient of Purcell Emil Schanz, MD.  Almyra Birman was referred to the pharmacist for assistance related to: HLD  Successful contact was made with the patient to discuss pharmacy services including being ready for the pharmacist to call at least 5 minutes before the scheduled appointment time and to have medication bottles and any blood pressure readings ready for review. The patient agreed to meet with the pharmacist via telephone visit on 06/08/2024  Thedford Franks, CMA [place]  [facility], [facility] Guide Direct Dial: [phone]  Fax: [phone] Website: [email or web address]

## 2024-05-28 NOTE — Progress Notes (Signed)
 Care Guide Pharmacy Note  05/28/2024 Name: Chelsey Hunt MRN: [id number] DOB: 30-Apr-1966  Referred By: Purcell Emil Schanz, MD Reason for referral: Call Attempt #1 and Complex Care Management (Outreach to schedule referral with pharmacist )   Chelsey Hunt is a 58 y.o. year old female who is a primary care patient of Purcell, Emil Schanz, MD.  Chelsey Hunt was referred to the pharmacist for assistance related to: HLD  A second unsuccessful telephone outreach was attempted today to contact the patient who was referred to the pharmacy team for assistance with medication management. Additional attempts will be made to contact the patient.  Thedford Franks, CMA [place]  [facility], [facility] Guide Direct Dial: [phone]  Fax: [phone] Website: [email or web address]

## 2024-06-08 ENCOUNTER — Other Ambulatory Visit (HOSPITAL_COMMUNITY): Payer: Self-pay

## 2024-06-08 ENCOUNTER — Other Ambulatory Visit (INDEPENDENT_AMBULATORY_CARE_PROVIDER_SITE_OTHER): Admitting: Pharmacist

## 2024-06-08 DIAGNOSIS — E785 Hyperlipidemia, unspecified: Secondary | ICD-10-CM

## 2024-06-08 DIAGNOSIS — E039 Hypothyroidism, unspecified: Secondary | ICD-10-CM

## 2024-06-08 DIAGNOSIS — E1169 Type 2 diabetes mellitus with other specified complication: Secondary | ICD-10-CM

## 2024-06-08 MED ORDER — ATORVASTATIN CALCIUM 10 MG PO TABS
10.0000 mg | ORAL_TABLET | ORAL | 0 refills | Status: AC
  Filled 2024-06-08: qty 36, 84d supply, fill #0

## 2024-06-08 NOTE — Patient Instructions (Signed)
 It was a pleasure speaking with you today!  Statr atorvastatin 10 mg three times weekly. Contact me if you are having side effects or other concerns.  Next phone call is scheduled for 06/29/24 at 9 AM.  Feel free to call with any questions or concerns!  Darrelyn Drum, PharmD, BCPS, CPP Clinical Pharmacist Practitioner [place] [facility] at [facility] [phone]

## 2024-06-08 NOTE — Progress Notes (Signed)
 06/08/2024 Name: Chelsey Hunt MRN: [id number] DOB: 07/06/1966  Chief Complaint  Patient presents with   Diabetes   Hyperlipidemia   Medication Management    Chelsey Hunt is a 58 y.o. year old female who presented for a telephone visit.   They were referred to the pharmacist by their PCP for assistance in managing diabetes and hyperlipidemia/cardiovascular risk reduction.   Subjective:  Care Team: Primary Care Provider: Purcell Emil Schanz, MD ; Next Scheduled Visit: 05/19/25   Medication Access/Adherence  Current Pharmacy:  JOLYNN PACK - [facility] [address], [address] [place] [address] Phone: [phone] Fax: [phone]   Patient reports affordability concerns with their medications: No  Patient reports access/transportation concerns to their pharmacy: No  Patient reports adherence concerns with their medications:  No    Pt is fearful of medications and their side effects, prefers nonpharmacologic methods. Has had trouble tolerating medications in the past.  Pt did start increased levothyroxine  100 mcg - she notes fatigue has improved but still present.  Works third shift  Diabetes:  Current medications: Jardiance  10 mg daily (recently started) Medications tried in the past: none  Pt notes she has noticed she feels more hungry on Jardiance .  Current meal patterns: has been seeing a dietician and working on diet  Current physical activity: Gym 2x per week   Macrovascular and Microvascular Risk Reduction:  Statin? no; patient previously intolerant to statin therapy; ACEi/ARB? No  Last urinary albumin/creatinine ratio:   Last eye exam:   Last foot exam: No foot exam found Tobacco Use:  Tobacco Use: Low Risk  (05/28/2024)   Patient History    Smoking Tobacco Use: Never    Smokeless Tobacco Use: Never    Passive Exposure: Past   Hyperlipidemia/ASCVD Risk Reduction  Current lipid lowering medications:  none Medications tried in the past: Rosuavstatin (myalgias_  Antiplatelet regimen: none  The 10-year ASCVD risk score (Arnett DK, et al., 2019) is: 10.5%   Values used to calculate the score:     Age: 37 years     Clincally relevant sex: Female     Is Non-Hispanic African American: Yes     Diabetic: Yes     Tobacco smoker: No     Systolic Blood Pressure: 124 mmHg     Is BP treated: No     HDL Cholesterol: 65.4 mg/dL     Total Cholesterol: 238 mg/dL    Objective:  Lab Results  Component Value Date   HGBA1C 6.6 (H) 05/18/2024    Lab Results  Component Value Date   CREATININE 0.94 05/18/2024   BUN 10 05/18/2024   NA 139 05/18/2024   K 3.5 05/18/2024   CL 103 05/18/2024   CO2 28 05/18/2024    Lab Results  Component Value Date   CHOL 238 (H) 05/18/2024   HDL 65.40 05/18/2024   LDLCALC 155 (H) 05/18/2024   LDLDIRECT 133.5 10/23/2013   TRIG 86.0 05/18/2024   CHOLHDL 4 05/18/2024    Medications Reviewed Today     Reviewed by Merceda Lela SAUNDERS, RPH (Pharmacist) on 06/08/24 at 0945  Med List Status: <None>   Medication Order Taking? Sig Documenting Provider Last Dose Status Informant  acetaminophen  (TYLENOL ) 325 MG tablet [id number]  Take 650 mg by mouth every 6 (six) hours as needed for mild pain. [provider]  Active Self  Cholecalciferol (D3 2000 PO) [id number]  Take by mouth. [provider]  Active   cyclobenzaprine  (FLEXERIL ) 5 MG  tablet [id number]  Take 1 tablet (5 mg total) by mouth 3 (three) times daily as needed for muscle spasms. Patel, Donika K, DO  Active   dicyclomine  (BENTYL ) 10 MG capsule [id number]  Take 1 capsule (10 mg total) by mouth 3 (three) times daily as needed.   Active   empagliflozin  (JARDIANCE ) 10 MG TABS tablet [id number] Yes Take 1 tablet (10 mg total) by mouth daily before breakfast. Purcell Emil Schanz, MD  Active   ezetimibe  (ZETIA ) 10 MG tablet [id number] Yes Take 1 tablet (10 mg total) by mouth every evening. Sagardia,  Miguel Jose, MD  Active   ibuprofen  (ADVIL ,MOTRIN ) 200 MG tablet [id number]  Take 200 mg by mouth every 8 (eight) hours as needed. [provider]  Active   levothyroxine  (UNITHROID ) 100 MCG tablet [id number] Yes Take 1 tablet (100 mcg total) by mouth daily before breakfast. Sagardia, Miguel Jose, MD  Active   magnesium 30 MG tablet [id number]  Take 30 mg by mouth daily. [provider]  Active   pantoprazole  (PROTONIX ) 20 MG tablet [id number]  Take 1 tablet (20 mg total) by mouth before breakfast and before evening meal.   Active   promethazine  (PHENERGAN ) 25 MG tablet [id number]  Take 1 tablet (25 mg total) by mouth every 6 (six) hours as needed for nausea or vomiting. Skeet Juliene SAUNDERS, DO  Active   Rimegepant Sulfate  (NURTEC) 75 MG TBDP [id number]  Take 1 tablet (75 mg total) by mouth every other day. Skeet Juliene SAUNDERS, DO  Active   Ubrogepant  (UBRELVY ) 100 MG TABS [id number]  Take 1 tablet (100 mg) by mouth as needed. May repeat dose in 2 hours.  Maximum 2 tablets in 24 hours. Skeet Juliene SAUNDERS, DO  Active   valACYclovir  (VALTREX ) 1000 MG tablet [id number]  Take 1 tablet (1,000 mg total) by mouth every 12 (twelve) hours for 5 days.   Active               Assessment/Plan:   Diabetes: - Currently controlled; goal A1c <7%. Cardiorenal risk reduction is opportunities for improvement.. Blood pressure is at goal <130/80. LDL is not at goal.  - Reviewed long term cardiovascular and renal outcomes of uncontrolled blood sugar., Reviewed goal A1c. Reviewed dietary modifications including increased fiber. Encouraged increased hydration with Jardiance . - Recommend to continue jardiance  10 mg daily. - A1c in December   Hyperlipidemia/ASCVD Risk Reduction: - Currently uncontrolled. LDL goal <155 - Reviewed long term complications of uncontrolled cholesterol - Reviewed dietary recommendations including increased fiber - Recommend to start atorvastatin 10 mg three times weekly  Hypothyroidism   Recheck TSH in 4-6 weeks. Pt to come last week in October for fasting TSH    Follow Up Plan: 10/20  Darrelyn Drum, PharmD, BCPS, CPP Clinical Pharmacist Practitioner [place] [facility] at [facility] [phone]

## 2024-06-29 ENCOUNTER — Other Ambulatory Visit: Admitting: Pharmacist

## 2024-06-29 DIAGNOSIS — E1169 Type 2 diabetes mellitus with other specified complication: Secondary | ICD-10-CM

## 2024-06-29 DIAGNOSIS — E785 Hyperlipidemia, unspecified: Secondary | ICD-10-CM

## 2024-06-29 DIAGNOSIS — E039 Hypothyroidism, unspecified: Secondary | ICD-10-CM

## 2024-06-29 NOTE — Progress Notes (Signed)
 06/29/2024 Name: Chelsey Hunt MRN: [id number] DOB: 01-29-1966  Chief Complaint  Patient presents with   Hyperlipidemia   Medication Management    Chelsey Hunt is a 58 y.o. year old female who presented for a telephone visit.   They were referred to the pharmacist by their PCP for assistance in managing diabetes and hyperlipidemia/cardiovascular risk reduction.   Subjective:  Care Team: Primary Care Provider: Purcell Emil Schanz, MD ; Next Scheduled Visit: 05/19/25   Medication Access/Adherence  Current Pharmacy:  JOLYNN PACK - [facility] [address], [address] [place] [address] Phone: [phone] Fax: [phone]   Patient reports affordability concerns with their medications: No  Patient reports access/transportation concerns to their pharmacy: No  Patient reports adherence concerns with their medications:  No    Pt is fearful of medications and their side effects, prefers nonpharmacologic methods. Has had trouble tolerating medications in the past.  Works third shift  Diabetes:  Current medications: Jardiance  10 mg daily (recently started) Medications tried in the past: none  Current meal patterns: has been seeing a dietician and working on diet  Current physical activity: Gym 2x per week   Macrovascular and Microvascular Risk Reduction:  Statin? no; patient previously intolerant to statin therapy; ACEi/ARB? No  Last urinary albumin/creatinine ratio:   Last eye exam:   Last foot exam: No foot exam found Tobacco Use:  Tobacco Use: Low Risk  (05/28/2024)   Patient History    Smoking Tobacco Use: Never    Smokeless Tobacco Use: Never    Passive Exposure: Past   Hyperlipidemia/ASCVD Risk Reduction  Current lipid lowering medications: none (has not started atorvastatin 3x/week, does not want to take more medications/concerned about side effects) Medications tried in the past: Rosuavstatin  (myalgias)  Antiplatelet regimen: none  The 10-year ASCVD risk score (Arnett DK, et al., 2019) is: 10.5%   Values used to calculate the score:     Age: 41 years     Clincally relevant sex: Female     Is Non-Hispanic African American: Yes     Diabetic: Yes     Tobacco smoker: No     Systolic Blood Pressure: 124 mmHg     Is BP treated: No     HDL Cholesterol: 65.4 mg/dL     Total Cholesterol: 238 mg/dL    Objective:  Lab Results  Component Value Date   HGBA1C 6.6 (H) 05/18/2024    Lab Results  Component Value Date   CREATININE 0.94 05/18/2024   BUN 10 05/18/2024   NA 139 05/18/2024   K 3.5 05/18/2024   CL 103 05/18/2024   CO2 28 05/18/2024    Lab Results  Component Value Date   CHOL 238 (H) 05/18/2024   HDL 65.40 05/18/2024   LDLCALC 155 (H) 05/18/2024   LDLDIRECT 133.5 10/23/2013   TRIG 86.0 05/18/2024   CHOLHDL 4 05/18/2024    Medications Reviewed Today     Reviewed by Merceda Lela SAUNDERS, RPH (Pharmacist) on 06/29/24 at 1000  Med List Status: <None>   Medication Order Taking? Sig Documenting Provider Last Dose Status Informant  acetaminophen  (TYLENOL ) 325 MG tablet [id number]  Take 650 mg by mouth every 6 (six) hours as needed for mild pain. [provider]  Active Self  atorvastatin (LIPITOR) 10 MG tablet [id number]  Take 1 tablet (10 mg total) by mouth 3 (three) times a week.  Patient not taking: Reported on 06/29/2024   Sagardia, Miguel Jose, MD  Active  Cholecalciferol (D3 2000 PO) [id number]  Take by mouth. [provider]  Active   cyclobenzaprine  (FLEXERIL ) 5 MG tablet [id number]  Take 1 tablet (5 mg total) by mouth 3 (three) times daily as needed for muscle spasms. Patel, Donika K, DO  Active   dicyclomine  (BENTYL ) 10 MG capsule [id number]  Take 1 capsule (10 mg total) by mouth 3 (three) times daily as needed.   Active   empagliflozin  (JARDIANCE ) 10 MG TABS tablet [id number] Yes Take 1 tablet (10 mg total) by mouth daily before breakfast.  Sagardia, Miguel Jose, MD  Active   ezetimibe  (ZETIA ) 10 MG tablet [id number] Yes Take 1 tablet (10 mg total) by mouth every evening. Sagardia, Miguel Jose, MD  Active   ibuprofen  (ADVIL ,MOTRIN ) 200 MG tablet [id number]  Take 200 mg by mouth every 8 (eight) hours as needed. [provider]  Active   levothyroxine  (UNITHROID ) 100 MCG tablet [id number] Yes Take 1 tablet (100 mcg total) by mouth daily before breakfast. Sagardia, Miguel Jose, MD  Active   magnesium 30 MG tablet [id number]  Take 30 mg by mouth daily. [provider]  Active   pantoprazole  (PROTONIX ) 20 MG tablet [id number]  Take 1 tablet (20 mg total) by mouth before breakfast and before evening meal.   Active   promethazine  (PHENERGAN ) 25 MG tablet [id number]  Take 1 tablet (25 mg total) by mouth every 6 (six) hours as needed for nausea or vomiting. Skeet Juliene SAUNDERS, DO  Active   Rimegepant Sulfate  (NURTEC) 75 MG TBDP [id number]  Take 1 tablet (75 mg total) by mouth every other day. Skeet Juliene SAUNDERS, DO  Active   Ubrogepant  (UBRELVY ) 100 MG TABS [id number]  Take 1 tablet (100 mg) by mouth as needed. May repeat dose in 2 hours.  Maximum 2 tablets in 24 hours. Skeet Juliene SAUNDERS, DO  Active   valACYclovir  (VALTREX ) 1000 MG tablet [id number]  Take 1 tablet (1,000 mg total) by mouth every 12 (twelve) hours for 5 days.   Active             Assessment/Plan:   Diabetes: - Currently controlled; goal A1c <7%. Cardiorenal risk reduction is opportunities for improvement.. Blood pressure is at goal <130/80. LDL is not at goal.  - Reviewed long term cardiovascular and renal outcomes of uncontrolled blood sugar., Reviewed goal A1c. Reviewed dietary modifications including increased fiber. Encouraged increased hydration with Jardiance . - Recommend to continue jardiance  10 mg daily. - A1c in December   Hyperlipidemia/ASCVD Risk Reduction: - Currently uncontrolled. LDL goal <70 - Reviewed long term complications of uncontrolled cholesterol,  purpose of statin for heart protection - Reviewed dietary recommendations including increased fiber - Recommend to start atorvastatin 10 mg three times weekly. Stop ezetimibe  when starting atorvastatin.  Hypothyroidism  Recheck TSH in 4-6 weeks. Pt to come last week in October for fasting TSH    Follow Up Plan: Lab next week  Darrelyn Drum, PharmD, BCPS, CPP Clinical Pharmacist Practitioner [place] [facility] at [facility] [phone]

## 2024-06-29 NOTE — Patient Instructions (Signed)
 It was a pleasure speaking with you today!  Start atorvastatin three times weekly. Stop ezetimibe .  Come next week for fasting thyroid  level check.  Feel free to call with any questions or concerns!  Darrelyn Drum, PharmD, BCPS, CPP Clinical Pharmacist Practitioner [place] [facility] at [facility] [phone]

## 2024-07-03 ENCOUNTER — Other Ambulatory Visit (HOSPITAL_COMMUNITY): Payer: Self-pay

## 2024-07-03 ENCOUNTER — Other Ambulatory Visit: Payer: Self-pay | Admitting: Emergency Medicine

## 2024-07-03 DIAGNOSIS — E7800 Pure hypercholesterolemia, unspecified: Secondary | ICD-10-CM

## 2024-07-04 ENCOUNTER — Other Ambulatory Visit (HOSPITAL_COMMUNITY): Payer: Self-pay

## 2024-07-04 MED ORDER — EZETIMIBE 10 MG PO TABS
10.0000 mg | ORAL_TABLET | Freq: Every evening | ORAL | 3 refills | Status: AC
  Filled 2024-07-04 – 2024-09-30 (×2): qty 90, 90d supply, fill #0
  Filled 2024-09-30: qty 90, 90d supply, fill #1

## 2024-07-06 ENCOUNTER — Other Ambulatory Visit (HOSPITAL_COMMUNITY): Payer: Self-pay

## 2024-07-06 ENCOUNTER — Ambulatory Visit: Payer: Self-pay

## 2024-07-06 NOTE — Telephone Encounter (Signed)
 FYI Only or Action Required?: FYI only for provider.  Patient was last seen in primary care on 05/18/2024 by Purcell Emil Schanz, MD.  Called Nurse Triage reporting Palpitations.  Symptoms began several days ago.  Interventions attempted: Nothing.  Symptoms are: gradually worsening.  Triage Disposition: See PCP When Office is Open (Within 3 Days)  Patient/caregiver understands and will follow disposition?: Yes     Message from Montie POUR sent at 07/06/2024  8:10 AM EDT  Reason for Triage: Chelsey Hunt states she is having heart palpitations. Please call her back at [phone].   Reason for Disposition  [1] Palpitations AND [2] no improvement after using Care Advice  Answer Assessment - Initial Assessment Questions 1. DESCRIPTION: Please describe your heart rate or heartbeat that you are having (e.g., fast/slow, regular/irregular, skipped or extra beats, palpitations)     Irregular and occasionally skips a beat  2. ONSET: When did it start? (e.g., minutes, hours, days)      3-4 days  3. PATTERN Does it come and go, or has it been constant since it started?  Does it get worse with exertion?   Are you feeling it now?     Constant when sitting or lying down.  4. HEART RATE: Can you tell me your heart rate? How many beats in 15 seconds?  Note: Not all patients can do this.       Feels a little slower now and skips a beat  5. RECURRENT SYMPTOM: Have you ever had this before? If Yes, ask: When was the last time? and What happened that time?      She states she had another issue that was similar in the past  6. CAUSE: What do you think is causing the palpitations?     Unsure if a result of a change in her medication regimen 7. CARDIAC HISTORY: Do you have any history of heart disease? (e.g., heart attack, angina, bypass surgery, angioplasty, arrhythmia)      [place]  8. OTHER SYMPTOMS: Do you have any other symptoms? (e.g., dizziness, chest pain, sweating,  difficulty breathing)       Headache  Protocols used: Heart Rate and Heartbeat Questions-A-AH

## 2024-07-07 ENCOUNTER — Ambulatory Visit: Admitting: Emergency Medicine

## 2024-07-07 ENCOUNTER — Ambulatory Visit: Attending: Emergency Medicine

## 2024-07-07 ENCOUNTER — Encounter: Payer: Self-pay | Admitting: Emergency Medicine

## 2024-07-07 VITALS — BP 126/80 | HR 80 | Temp 97.8°F | Ht 60.0 in | Wt 144.0 lb

## 2024-07-07 DIAGNOSIS — R002 Palpitations: Secondary | ICD-10-CM | POA: Diagnosis not present

## 2024-07-07 DIAGNOSIS — E1169 Type 2 diabetes mellitus with other specified complication: Secondary | ICD-10-CM | POA: Diagnosis not present

## 2024-07-07 DIAGNOSIS — E785 Hyperlipidemia, unspecified: Secondary | ICD-10-CM | POA: Diagnosis not present

## 2024-07-07 DIAGNOSIS — E039 Hypothyroidism, unspecified: Secondary | ICD-10-CM

## 2024-07-07 DIAGNOSIS — Z7984 Long term (current) use of oral hypoglycemic drugs: Secondary | ICD-10-CM | POA: Diagnosis not present

## 2024-07-07 LAB — TSH: TSH: 0.09 u[IU]/mL — ABNORMAL LOW (ref 0.35–5.50)

## 2024-07-07 NOTE — Progress Notes (Signed)
 Chelsey Hunt 58 y.o.   Chief Complaint  Patient presents with   Palpitations    Has been going on since last Wednesday says it starts out of nowhere comes and goes     HISTORY OF PRESENT ILLNESS: This is a 58 y.o. female complaining of intermittent palpitations for the last week Denies dizziness, lightheadedness, or syncope. Denies chest pain or difficulty breathing. No other complaints or medical concerns today.  Palpitations  Pertinent negatives include no chest pain, coughing, dizziness, fever, nausea, shortness of breath or vomiting.     Prior to Admission medications   Medication Sig Start Date End Date Taking? Authorizing Provider  acetaminophen  (TYLENOL ) 325 MG tablet Take 650 mg by mouth every 6 (six) hours as needed for mild pain.   Yes [provider]  atorvastatin (LIPITOR) 10 MG tablet Take 1 tablet (10 mg total) by mouth 3 (three) times a week. 06/08/24  Yes Raymondo Garcialopez Jose, MD  Cholecalciferol (D3 2000 PO) Take by mouth.   Yes [provider]  cyclobenzaprine  (FLEXERIL ) 5 MG tablet Take 1 tablet (5 mg total) by mouth 3 (three) times daily as needed for muscle spasms. 12/23/23  Yes Patel, Donika K, DO  dicyclomine  (BENTYL ) 10 MG capsule Take 1 capsule (10 mg total) by mouth 3 (three) times daily as needed. 01/24/24  Yes   empagliflozin  (JARDIANCE ) 10 MG TABS tablet Take 1 tablet (10 mg total) by mouth daily before breakfast. 05/19/24  Yes Rita Vialpando, Emil Schanz, MD  ibuprofen  (ADVIL ,MOTRIN ) 200 MG tablet Take 200 mg by mouth every 8 (eight) hours as needed.   Yes [provider]  levothyroxine  (UNITHROID ) 100 MCG tablet Take 1 tablet (100 mcg total) by mouth daily before breakfast. 05/20/24  Yes Akaya Proffit, Emil Schanz, MD  magnesium 30 MG tablet Take 30 mg by mouth daily.   Yes [provider]  pantoprazole  (PROTONIX ) 20 MG tablet Take 1 tablet (20 mg total) by mouth before breakfast and before evening meal. 01/24/24  Yes    promethazine  (PHENERGAN ) 25 MG tablet Take 1 tablet (25 mg total) by mouth every 6 (six) hours as needed for nausea or vomiting. 04/08/24  Yes Jaffe, Adam R, DO  Rimegepant Sulfate  (NURTEC) 75 MG TBDP Take 1 tablet (75 mg total) by mouth every other day. 10/09/23  Yes Jaffe, Adam R, DO  Ubrogepant  (UBRELVY ) 100 MG TABS Take 1 tablet (100 mg) by mouth as needed. May repeat dose in 2 hours.  Maximum 2 tablets in 24 hours. 10/09/23  Yes Jaffe, Adam R, DO  valACYclovir  (VALTREX ) 1000 MG tablet Take 1 tablet (1,000 mg total) by mouth every 12 (twelve) hours for 5 days. 10/01/23  Yes   ezetimibe  (ZETIA ) 10 MG tablet Take 1 tablet (10 mg total) by mouth every evening. Patient not taking: Reported on 07/07/2024 07/04/24   Purcell Emil Schanz, MD    Allergies  Allergen Reactions   Imitrex  [Sumatriptan ] Palpitations   Reglan  [Metoclopramide ] Palpitations   Rosuvastatin  Other (See Comments)    Arthralgia and myalgia   Propoxyphene N-Acetaminophen     Nabumetone Rash    REACTION: GI upset   Topiramate Rash    REACTION: rash    Patient Active Problem List   Diagnosis Date Noted   Dyslipidemia associated with type 2 diabetes mellitus (HCC) 05/19/2024   Overweight 02/17/2024   Chronic right shoulder pain 11/26/2023   Cervical radiculopathy 04/10/2022   Statin intolerance 03/08/2022   Family history of heart disease 11/15/2021   Obstructive sleep apnea  09/07/2021   Prediabetes 09/07/2021   Dyslipidemia 08/14/2021   Positive ANA (antinuclear antibody) 04/20/2021   Finger deformity 04/20/2021   Raynaud phenomenon 09/17/2019   Anxiety 07/30/2019   Chronic idiopathic constipation 01/08/2019   Psychophysiological insomnia 07/07/2018   Intractable migraine without aura and without status migrainosus 07/02/2016   Pancreas divisum of native pancreas 12/27/2014   Right upper quadrant abdominal pain 12/21/2014   Uterine leiomyoma 09/26/2012   GERD (gastroesophageal reflux disease)    Hypothyroidism  08/12/2009    Past Medical History:  Diagnosis Date   ACID REFLUX DISEASE 01/03/2008   Anemia    ANEMIA, IRON DEFICIENCY 12/23/2008   history   Anxiety    Chronic tension type headache 01/13/2009   migraines   Complication of anesthesia    pt states she just needs a small amount or she will sleep too long   Dysplasia of cervix, low grade (CIN 1)    Fibroid    GERD (gastroesophageal reflux disease)    HEART MURMUR, SYSTOLIC 07/07/2010   will have echo 06/18/2013   HYPOTHYROIDISM, POST-RADIATION 08/12/2009   Irritable bowel syndrome 12/23/2008   Migraine    Ovarian cyst    Ovarian cyst    THYROID  NODULE, RIGHT 04/21/2009    Past Surgical History:  Procedure Laterality Date   ABDOMINAL HYSTERECTOMY     COLONOSCOPY     HYSTEROSCOPY     LAPAROSCOPY     NOVASURE ABLATION     POLYPECTOMY     TUBAL LIGATION     UPPER GI ENDOSCOPY     VAGINAL HYSTERECTOMY N/A 06/24/2013   Procedure: TOTAL HYSTERECTOMY VAGINAL;  Surgeon: Nena DELENA App, MD;  Location: [facility];  Service: Gynecology;  Laterality: N/A;    Social History   Socioeconomic History   Marital status: Married    Spouse name: Alm   Number of children: 3   Years of education: Not on file   Highest education level: Associate degree: occupational, scientist, product/process development, or vocational program  Occupational History   Occupation: WOMENS    Employer: [employer]  Tobacco Use   Smoking status: Never    Passive exposure: Past   Smokeless tobacco: Never  Vaping Use   Vaping status: Never Used  Substance and Sexual Activity   Alcohol use: No   Drug use: No   Sexual activity: Yes    Birth control/protection: Surgical    Comment: BTL  Other Topics Concern   Not on file  Social History Narrative   Regular exercise-yes      Patient is right-handed.She lives with her husband in a 1 story home. She has recently been avoiding caffeine. Walks.   Right handed   Does work   Social Drivers of Corporate Investment Banker Strain: Low  Risk  (05/18/2024)   Overall Financial Resource Strain (CARDIA)    Difficulty of Paying Living Expenses: Not very hard  Food Insecurity: No Food Insecurity (05/18/2024)   Hunger Vital Sign    Worried About Running Out of Food in the Last Year: Never true    Ran Out of Food in the Last Year: Never true  Transportation Needs: No Transportation Needs (05/18/2024)   PRAPARE - Administrator, Civil Service (Medical): No    Lack of Transportation (Non-Medical): No  Physical Activity: Sufficiently Active (05/18/2024)   Exercise Vital Sign    Days of Exercise per Week: 2 days    Minutes of Exercise per Session: 90 min  Stress: Stress Concern  Present (05/18/2024)   [employer] of Occupational Health - Occupational Stress Questionnaire    Feeling of Stress: To some extent  Social Connections: Socially Integrated (05/18/2024)   Social Connection and Isolation Panel    Frequency of Communication with Friends and Family: More than three times a week    Frequency of Social Gatherings with Friends and Family: Once a week    Attends Religious Services: More than 4 times per year    Active Member of [employer] or Organizations: Yes    Attends Banker Meetings: 1 to 4 times per year    Marital Status: Married  Catering Manager Violence: Not on file    Family History  Problem Relation Age of Onset   Heart disease Mother    Thyroid  disease Mother    Heart attack Mother    Cancer Father    Heart disease Father    Heart attack Father    Throat cancer Father    Lung cancer Father    Alcoholism Father    Graves' disease Sister    Asthma Sister    Hepatitis Sister    Stomach cancer Sister    CVA Brother    Alzheimer's disease Brother    Goiter Brother    Hypertension Brother    Hypertension Brother    Alcoholism Brother    Hyperlipidemia Maternal Aunt    Hypertension Paternal Grandfather    Healthy Daughter    GER disease Son    GER disease Son    Goiter Other         Siblings 2   Depression Neg Hx    Alcohol abuse Neg Hx    Drug abuse Neg Hx    Early death Neg Hx    Hearing loss Neg Hx    Kidney disease Neg Hx    Stroke Neg Hx      Review of Systems  Constitutional: Negative.  Negative for chills and fever.  HENT: Negative.  Negative for congestion and sore throat.   Respiratory: Negative.  Negative for cough and shortness of breath.   Cardiovascular:  Positive for palpitations. Negative for chest pain.  Gastrointestinal:  Negative for abdominal pain, diarrhea, nausea and vomiting.  Genitourinary: Negative.  Negative for dysuria and hematuria.  Skin: Negative.  Negative for rash.  Neurological: Negative.  Negative for dizziness and headaches.  All other systems reviewed and are negative.   Today's Vitals   07/07/24 1606  BP: 126/80  Pulse: 80  Temp: 97.8 F (36.6 C)  TempSrc: Oral  SpO2: 98%  Weight: 144 lb (65.3 kg)  Height: 5' (1.524 m)   Body mass index is 28.12 kg/m.   Physical Exam Vitals reviewed.  Constitutional:      Appearance: Normal appearance.  HENT:     Head: Normocephalic.     Mouth/Throat:     Mouth: Mucous membranes are moist.     Pharynx: Oropharynx is clear.  Eyes:     Extraocular Movements: Extraocular movements intact.     Pupils: Pupils are equal, round, and reactive to light.  Cardiovascular:     Rate and Rhythm: Normal rate and regular rhythm.     Pulses: Normal pulses.     Heart sounds: Murmur heard.  Pulmonary:     Effort: Pulmonary effort is normal.     Breath sounds: Normal breath sounds.  Abdominal:     Palpations: Abdomen is soft.     Tenderness: There is no abdominal tenderness.  Musculoskeletal:  Cervical back: No tenderness.  Lymphadenopathy:     Cervical: No cervical adenopathy.  Skin:    General: Skin is warm and dry.     Capillary Refill: Capillary refill takes less than 2 seconds.  Neurological:     General: No focal deficit present.     Mental Status: She is alert and  oriented to person, place, and time.  Psychiatric:        Mood and Affect: Mood normal.        Behavior: Behavior normal.    EKG: Normal sinus rhythm with ventricular rate of 73.  No acute ischemic changes.  ASSESSMENT & PLAN: A total of 42 minutes was spent with the patient and counseling/coordination of care regarding preparing for this visit, review of most recent office visit notes, review of multiple chronic medical conditions and their management, review of all medications, review of most recent bloodwork results, review of health maintenance items, education on nutrition, prognosis, documentation, and need for follow up.   Problem List Items Addressed This Visit       Endocrine   Hypothyroidism   TSH level repeated today Presently on levothyroxine  100 mcg daily      Dyslipidemia associated with type 2 diabetes mellitus (HCC)   Lab Results  Component Value Date   HGBA1C 6.6 (H) 05/18/2024  Started Jardiance  10 mg daily Advised to start Zetia  10 mg daily but has not started yet Cardiovascular risks associated with diabetes and dyslipidemia discussed Diet and nutrition discussed         Other   Palpitations - Primary   Clinically stable.  No red flag signs or symptoms Differential diagnosis discussed.  Patient has thyroid  condition. Normal EKG done today Recommend Zio patch for 14 days May need follow-up with cardiology depending on results      Relevant Orders   LONG TERM MONITOR XT (3-14 DAYS)   Patient Instructions  Palpitations Palpitations are feelings that your heartbeat is not normal. Your heartbeat may feel like it is: Uneven (irregular). Faster than normal. Fluttering. Skipping a beat. This is usually not a serious problem. However, a doctor will do tests and check your medical history to make sure that you do not have a serious heart problem. Follow these instructions at home: Watch for any changes in your condition. Tell your doctor about any  changes. Take these actions to help manage your symptoms: Eating and drinking Follow instructions from your doctor about things to eat and drink. You may be told to avoid these things: Drinks that have caffeine in them, such as coffee, tea, soft drinks, and energy drinks. Chocolate. Alcohol. Diet pills. Lifestyle     Try to lower your stress. These things can help you relax: Yoga. Deep breathing and meditation. Guided imagery. This is using words and images to create positive thoughts. Exercise, including swimming, jogging, and walking. Tell your doctor if you have more abnormal heartbeats when you are active. If you have chest pain or feel short of breath with exercise, do not keep doing the exercise until you are seen by your doctor. Biofeedback. This is using your mind to control things in your body, such as your heartbeat. Get plenty of rest and sleep. Keep a regular bed time. Do not use drugs, such as cocaine or ecstasy. Do not use marijuana. Do not smoke or use any products that contain nicotine or tobacco. If you need help quitting, ask your doctor. General instructions Take over-the-counter and prescription medicines only as told  by your doctor. Keep all follow-up visits. You may need more tests if palpitations do not go away or get worse. Contact a doctor if: You keep having fast or uneven heartbeats for a long time. Your symptoms happen more often. Get help right away if: You have chest pain. You feel short of breath. You have a very bad headache. You feel dizzy. You faint. These symptoms may be an emergency. Get help right away. Call your local emergency services (911 in the [place] Do not wait to see if the symptoms will go away. Do not drive yourself to the hospital. Summary Palpitations are feelings that your heartbeat is uneven or faster than normal. It may feel like your heart is fluttering or skipping a beat. Avoid food and drinks that may cause this condition.  These include caffeine, chocolate, and alcohol. Try to lower your stress. Do not smoke or use drugs. Get help right away if you faint, feel dizzy, feel short of breath, have chest pain, or have a very bad headache. This information is not intended to replace advice given to you by your health care provider. Make sure you discuss any questions you have with your health care provider. Document Revised: 01/18/2021 Document Reviewed: 01/18/2021 Elsevier Patient Education  2024 Elsevier Inc.     Emil Schaumann, MD [facility]

## 2024-07-07 NOTE — Patient Instructions (Signed)
 Palpitations Palpitations are feelings that your heartbeat is not normal. Your heartbeat may feel like it is: Uneven (irregular). Faster than normal. Fluttering. Skipping a beat. This is usually not a serious problem. However, a doctor will do tests and check your medical history to make sure that you do not have a serious heart problem. Follow these instructions at home: Watch for any changes in your condition. Tell your doctor about any changes. Take these actions to help manage your symptoms: Eating and drinking Follow instructions from your doctor about things to eat and drink. You may be told to avoid these things: Drinks that have caffeine in them, such as coffee, tea, soft drinks, and energy drinks. Chocolate. Alcohol. Diet pills. Lifestyle     Try to lower your stress. These things can help you relax: Yoga. Deep breathing and meditation. Guided imagery. This is using words and images to create positive thoughts. Exercise, including swimming, jogging, and walking. Tell your doctor if you have more abnormal heartbeats when you are active. If you have chest pain or feel short of breath with exercise, do not keep doing the exercise until you are seen by your doctor. Biofeedback. This is using your mind to control things in your body, such as your heartbeat. Get plenty of rest and sleep. Keep a regular bed time. Do not use drugs, such as cocaine or ecstasy. Do not use marijuana. Do not smoke or use any products that contain nicotine or tobacco. If you need help quitting, ask your doctor. General instructions Take over-the-counter and prescription medicines only as told by your doctor. Keep all follow-up visits. You may need more tests if palpitations do not go away or get worse. Contact a doctor if: You keep having fast or uneven heartbeats for a long time. Your symptoms happen more often. Get help right away if: You have chest pain. You feel short of breath. You have a very  bad headache. You feel dizzy. You faint. These symptoms may be an emergency. Get help right away. Call your local emergency services (911 in the [place] Do not wait to see if the symptoms will go away. Do not drive yourself to the hospital. Summary Palpitations are feelings that your heartbeat is uneven or faster than normal. It may feel like your heart is fluttering or skipping a beat. Avoid food and drinks that may cause this condition. These include caffeine, chocolate, and alcohol. Try to lower your stress. Do not smoke or use drugs. Get help right away if you faint, feel dizzy, feel short of breath, have chest pain, or have a very bad headache. This information is not intended to replace advice given to you by your health care provider. Make sure you discuss any questions you have with your health care provider. Document Revised: 01/18/2021 Document Reviewed: 01/18/2021 Elsevier Patient Education  2024 [employer].

## 2024-07-07 NOTE — Assessment & Plan Note (Signed)
 Clinically stable.  No red flag signs or symptoms Differential diagnosis discussed.  Patient has thyroid  condition. Normal EKG done today Recommend Zio patch for 14 days May need follow-up with cardiology depending on results

## 2024-07-07 NOTE — Assessment & Plan Note (Signed)
 TSH level repeated today Presently on levothyroxine  100 mcg daily

## 2024-07-07 NOTE — Progress Notes (Unsigned)
 EP to read.

## 2024-07-07 NOTE — Assessment & Plan Note (Signed)
 Lab Results  Component Value Date   HGBA1C 6.6 (H) 05/18/2024  Started Jardiance  10 mg daily Advised to start Zetia  10 mg daily but has not started yet Cardiovascular risks associated with diabetes and dyslipidemia discussed Diet and nutrition discussed

## 2024-07-08 ENCOUNTER — Other Ambulatory Visit (HOSPITAL_COMMUNITY): Payer: Self-pay

## 2024-07-08 ENCOUNTER — Ambulatory Visit: Payer: Self-pay | Admitting: Emergency Medicine

## 2024-07-08 DIAGNOSIS — E039 Hypothyroidism, unspecified: Secondary | ICD-10-CM

## 2024-07-08 MED ORDER — LEVOTHYROXINE SODIUM 75 MCG PO TABS
75.0000 ug | ORAL_TABLET | Freq: Every day | ORAL | 5 refills | Status: AC
  Filled 2024-07-08: qty 30, 30d supply, fill #0
  Filled 2024-08-01: qty 30, 30d supply, fill #1
  Filled 2024-09-02 (×2): qty 30, 30d supply, fill #2
  Filled 2024-09-30: qty 90, 90d supply, fill #0

## 2024-07-13 ENCOUNTER — Encounter: Payer: Self-pay | Admitting: Emergency Medicine

## 2024-07-13 ENCOUNTER — Encounter: Payer: Self-pay | Admitting: Radiology

## 2024-07-13 NOTE — Addendum Note (Signed)
 Addended by: ROSALVA LEX RAMAN on: 07/13/2024 04:38 PM   Modules accepted: Orders

## 2024-07-14 ENCOUNTER — Other Ambulatory Visit: Payer: Self-pay | Admitting: Emergency Medicine

## 2024-07-14 DIAGNOSIS — E1169 Type 2 diabetes mellitus with other specified complication: Secondary | ICD-10-CM

## 2024-07-14 NOTE — Telephone Encounter (Signed)
**Note De-identified  [place] Obfuscation** Please advise 

## 2024-07-14 NOTE — Telephone Encounter (Signed)
 I will have our clinical pharmacist reach out to her to discuss options for cholesterol management.  Referral was placed today.  Thanks.

## 2024-07-14 NOTE — Telephone Encounter (Signed)
 Recommend to switch to rosuvastatin 

## 2024-07-15 NOTE — Telephone Encounter (Signed)
 Please advise if patient is needing to start back on ezetimibe 

## 2024-07-15 NOTE — Telephone Encounter (Signed)
 yes

## 2024-07-16 ENCOUNTER — Telehealth: Payer: Self-pay | Admitting: *Deleted

## 2024-07-16 NOTE — Telephone Encounter (Signed)
**Note De-identified  [place] Obfuscation** Please advise 

## 2024-07-16 NOTE — Telephone Encounter (Signed)
 Follow up with cardiologist

## 2024-07-16 NOTE — Progress Notes (Signed)
 Care Guide Pharmacy Note  07/16/2024 Name: Chelsey Hunt MRN: [id number] DOB: December 11, 1965  Referred By: Purcell Emil Schanz, MD Reason for referral: Call Attempt #1 and Complex Care Management (Outreach to schedule referral f/u with pharmacist )   Chelsey Hunt is a 58 y.o. year old female who is a primary care patient of Purcell, Emil Schanz, MD.  Chelsey Hunt was referred to the pharmacist for assistance related to: HLD  An unsuccessful telephone outreach was attempted today to contact the patient who was referred to the pharmacy team for assistance with medication management. Additional attempts will be made to contact the patient.  Thedford Franks, CMA [place]  [facility], [facility] Guide Direct Dial: [phone]  Fax: [phone] Website: [email or web address]

## 2024-07-20 NOTE — Progress Notes (Unsigned)
 Care Guide Pharmacy Note  07/20/2024 Name: [name] MRN: [id number] DOB: 12-08-1965  Referred By: Purcell Emil Schanz, MD Reason for referral: Call Attempt #1 and Complex Care Management (Outreach to schedule referral f/u with pharmacist )   Chelsey Hunt is a 58 y.o. year old female who is a primary care patient of Purcell, Emil Schanz, MD.  Devlin Mcveigh was referred to the pharmacist for assistance related to: HLD  A second unsuccessful telephone outreach was attempted today to contact the patient who was referred to the pharmacy team for assistance with medication management. Additional attempts will be made to contact the patient.  Thedford Franks, CMA [place]  [facility], [facility] Guide Direct Dial: [phone]  Fax: [phone] Website: [email or web address]

## 2024-07-21 NOTE — Progress Notes (Signed)
 Care Guide Pharmacy Note  07/21/2024 Name: Emaly Boschert MRN: [id number] DOB: [birth year]  Referred By: Purcell Emil Schanz, MD Reason for referral: Call Attempt #1 and Complex Care Management (Outreach to schedule referral f/u with pharmacist )   Lasharn Bufkin is a 58 y.o. year old female who is a primary care patient of Purcell, Emil Schanz, MD.  Kailee Essman was referred to the pharmacist for assistance related to: HLD  A third unsuccessful telephone outreach was attempted today to contact the patient who was referred to the pharmacy team for assistance with medication management. The Population Health team is pleased to engage with this patient at any time in the future upon receipt of referral and should he/she be interested in assistance from the Population Health team.  Thedford Franks, CMA [facility]  [facility], [facility] Guide Direct Dial: [phone]  Fax: [phone] Website: [email or web address]

## 2024-08-04 ENCOUNTER — Other Ambulatory Visit (HOSPITAL_COMMUNITY): Payer: Self-pay

## 2024-08-06 DIAGNOSIS — R002 Palpitations: Secondary | ICD-10-CM

## 2024-08-09 ENCOUNTER — Ambulatory Visit: Payer: Self-pay | Admitting: Emergency Medicine

## 2024-08-09 DIAGNOSIS — R9431 Abnormal electrocardiogram [ECG] [EKG]: Secondary | ICD-10-CM

## 2024-08-09 DIAGNOSIS — R002 Palpitations: Secondary | ICD-10-CM

## 2024-08-12 ENCOUNTER — Other Ambulatory Visit: Payer: Self-pay

## 2024-08-12 ENCOUNTER — Other Ambulatory Visit (HOSPITAL_COMMUNITY): Payer: Self-pay

## 2024-08-13 ENCOUNTER — Other Ambulatory Visit: Payer: Self-pay

## 2024-08-14 ENCOUNTER — Other Ambulatory Visit: Payer: Self-pay

## 2024-08-27 ENCOUNTER — Encounter: Payer: Self-pay | Admitting: Dietician

## 2024-08-27 ENCOUNTER — Ambulatory Visit: Admitting: Dietician

## 2024-08-27 VITALS — Wt 142.6 lb

## 2024-08-27 DIAGNOSIS — E785 Hyperlipidemia, unspecified: Secondary | ICD-10-CM | POA: Insufficient documentation

## 2024-08-27 NOTE — Progress Notes (Signed)
 Medical Nutrition Therapy  Appointment Start time:  70  Appointment End time:  1535  Primary concerns today: eat healthier, curb cravings   Referral diagnosis: E66.3; Employee Visit 3 Preferred learning style: no preference indicated Learning readiness: ready   NUTRITION ASSESSMENT   Anthropometrics  Ht: 60 in Wt 08/27/24: 142.6 lb Wt 05/28/24: 148.5 lb Wt 03/27/24: 148.8 lb  Clinical Medical Hx: anemia, anxiety, GERD, heart murmur, thyroid  disease, prediabetes, HLD, acid reflux, IBS-C Medications: levothyroxine , jardiance  Labs: 05/18/24: A1c 6.6%, chol 238, LDL 155 Notable Signs/Symptoms: constipation Food Allergies: none reported; lactose intolerant  Lifestyle & Dietary Hx  Pt reports she feels she has had 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 Pt reports she has continued working at nights, and is working every other day. Pt states her niece was in the hospital.   Pt states she has not been going to the gym consistently. Pt reports she was walking with her sister a while back but they fell out of the habit.   Pt reports she got a massage recently and found it to be helpful for self care and pain relief.   Pt reports she has been trying to walk more at work.   Estimated daily fluid intake: 64 oz Supplements: magnesium, vitamin D ,  Sleep: if not working 10 hours, if working 5 hours Stress / self-care: 'very high' pt report Current average weekly physical activity: working out 2 days per week for 1 hour.   24-Hr Dietary Recall First Meal: oatmeal with blueberries, cranberries, butter, honey Snack: none Second Meal: apple chips Snack: none Third Meal: bbq pork chop, green beans, yellow rice Snack: none Beverages: decaf coffee, regular coffee occasionally, water, ginger tea with 2-3 tsp honey   NUTRITION DIAGNOSIS  NB-1.1 Food and nutrition-related knowledge deficit As related to lack of prior education by a registered dietitian.  As evidenced by pt  report.   NUTRITION INTERVENTION  Nutrition education (E-1) on the following topics:   Prediabetes: Prediabetes is a condition where blood sugar levels are higher than normal but not yet high enough to be diagnosed as type 2 diabetes. A1C, or hemoglobin A1c, is a blood test that provides an average of a person's blood sugar levels over the past two to three months. It is commonly used to diagnose and monitor diabetes. For prediabetes, an A1C level between 5.7% and 6.4% typically is used to diagnose this. Here is how the A1C levels are generally categorized: Normal:  A1C below 5.7% Prediabetes:  A1C between 5.7% and 6.4% Diabetes:  A1C of 6.5% or higher When diagnosed with prediabetes, there are several lifestyle changes you can make to manage the condition: Healthy Eating:  Follow a well-balanced diet that includes a variety of fruits, vegetables, whole grains, lean proteins, and healthy fats. Monitor portion sizes and reduce intake of sugary and processed foods. Regular Physical Activity:  Engage in regular physical activity, such as brisk walking, cycling, or other aerobic exercises, for at least 150 minutes per week. Include strength training exercises at least twice a week. Weight Management: Achieve and maintain a healthy weight. Losing even a small amount of weight (3-5%) can significantly improve insulin sensitivity.  Food Groups Fruits & Vegetables: Aim to fill half your plate with a variety of fruits and vegetables. They are rich in vitamins, minerals, and fiber, and can help reduce the risk of chronic diseases. Choose a colorful assortment of fruits and vegetables to ensure you get a wide range of nutrients. Grains  and Starches: Make at least half of your grain choices whole grains, such as brown rice, whole wheat bread, and oats. Whole grains provide fiber, which aids in digestion and healthy cholesterol levels. Aim for whole forms of starchy vegetables such as potatoes, sweet  potatoes, beans, peas, and corn, which are fiber rich and provide many vitamins and minerals.  Protein: Incorporate lean sources of protein, such as poultry, fish, beans, nuts, and seeds, into your meals. Protein is essential for building and repairing tissues, staying full, balancing blood sugar, as well as supporting immune function. Dairy: Include low-fat or fat-free dairy products like milk, yogurt, and cheese in your diet. Dairy foods are excellent sources of calcium  and vitamin D , which are crucial for bone health.   Stress and GI Impact on Nutrition:    - Stress and anxiety can disrupt eating habits, leading to emotional eating (overeating comfort foods) or a loss of appetite (skipping meals or under-eating). Both can negatively impact nutrition, causing weight gain or nutrient deficiencies.    - Anxiety often triggers cravings for unhealthy foods like sweets and salty snacks, which provide quick energy but lack essential nutrients, affecting overall health and nutrition.    - Stress can also cause changes in eating patterns, such as eating too quickly or overeating, which can result in bloating, indigestion, or other GI discomforts.  Exercise Finding an exercise you enjoy is crucial for maintaining long-term fitness and overall health. Enjoyable activities are more likely to become regular habits, making it easier to stay consistent with physical activity. When you look forward to your workouts, exercise becomes a positive experience rather than a chore, reducing the likelihood of burnout or quitting. Enjoyable exercise also enhances mental well-being, as engaging in activities you love can boost mood, reduce stress, and provide a sense of accomplishment. Aim for 150 minutes of physical activity weekly. Make physical activity a part of your week. Try to include at least 30 minutes of physical activity 5 days each week or at least 150 minutes per week.  Handouts Provided Include Mindful  Plate Plate Method  Handouts Provided Include (initial assessment) Plate Method Snack Sheet  Learning Style & Readiness for Change Teaching method utilized: Visual & Auditory  Demonstrated degree of understanding via: Teach Back  Barriers to learning/adherence to lifestyle change: none  Assessment/Continuation of Previous Goals Established by Pt  Goal 1: during snacks aim to incorporate a carb + protein (examples: tuna and triscuits, apple and peanut butter, fruit and nuts, etc). - goal in progress, continue!  Goal 2: cook at home at least 2 nights per week (aim to include 1/2 plate non-starchy vegetables, 1/4 plate protein, and 1/4 plate complex carbs). - goal met, continue!!  Goal 3: keep going to the gym 2x/wk, add a 15-30 minute walk 3 times a week. - goal not met, continue to work towards!   MONITORING & EVALUATION Dietary intake, weekly physical activity, and follow up in 3 months.  Next Steps  Patient is to call for questions.

## 2024-09-01 ENCOUNTER — Other Ambulatory Visit: Payer: Self-pay | Admitting: Obstetrics and Gynecology

## 2024-09-01 DIAGNOSIS — Z1231 Encounter for screening mammogram for malignant neoplasm of breast: Secondary | ICD-10-CM

## 2024-09-02 ENCOUNTER — Other Ambulatory Visit (HOSPITAL_COMMUNITY): Payer: Self-pay

## 2024-09-02 ENCOUNTER — Other Ambulatory Visit: Payer: Self-pay

## 2024-09-02 ENCOUNTER — Telehealth: Payer: Self-pay

## 2024-09-02 NOTE — Telephone Encounter (Signed)
 Please advise upon return in office

## 2024-09-02 NOTE — Telephone Encounter (Signed)
 Copied from CRM #[id number]. Topic: Clinical - Prescription Issue >> Sep 02, 2024 10:29 AM Nessti S wrote: Reason for CRM: levothyroxine  (UNITHROID ) 75 MCG tablet is personnel officer, old brand has been discontinued

## 2024-09-29 ENCOUNTER — Ambulatory Visit
Admission: RE | Admit: 2024-09-29 | Discharge: 2024-09-29 | Disposition: A | Source: Ambulatory Visit | Attending: Obstetrics and Gynecology | Admitting: Obstetrics and Gynecology

## 2024-09-29 DIAGNOSIS — Z1231 Encounter for screening mammogram for malignant neoplasm of breast: Secondary | ICD-10-CM

## 2024-09-30 ENCOUNTER — Other Ambulatory Visit: Payer: Self-pay | Admitting: Neurology

## 2024-09-30 ENCOUNTER — Ambulatory Visit: Admitting: Emergency Medicine

## 2024-09-30 ENCOUNTER — Other Ambulatory Visit (HOSPITAL_COMMUNITY): Payer: Self-pay

## 2024-09-30 ENCOUNTER — Encounter: Payer: Self-pay | Admitting: Emergency Medicine

## 2024-09-30 ENCOUNTER — Other Ambulatory Visit: Payer: Self-pay

## 2024-09-30 VITALS — BP 134/72 | HR 92 | Temp 97.9°F | Ht 60.0 in | Wt 137.0 lb

## 2024-09-30 DIAGNOSIS — H6122 Impacted cerumen, left ear: Secondary | ICD-10-CM | POA: Insufficient documentation

## 2024-09-30 NOTE — Progress Notes (Signed)
 Chelsey Hunt 59 y.o.   Chief Complaint  Patient presents with   Ear Problem    Ear problem (left). Notes hearing pulsating and whooshing sound. No ear pain but some migraine. Symptoms for 1 day    HISTORY OF PRESENT ILLNESS: This is a 59 y.o. female complaining of whooshing sound in left ear since yesterday Denies pain.  Sometimes can hear pulsating sound in her left ear No other complaints or medical concerns today.  HPI   Prior to Admission medications  Medication Sig Start Date End Date Taking? Authorizing Provider  acetaminophen  (TYLENOL ) 325 MG tablet Take 650 mg by mouth every 6 (six) hours as needed for mild pain.   Yes [provider]  atorvastatin  (LIPITOR) 10 MG tablet Take 1 tablet (10 mg total) by mouth 3 (three) times a week. 06/08/24  Yes Roshell Brigham Jose, MD  Cholecalciferol (D3 2000 PO) Take by mouth.   Yes [provider]  cyclobenzaprine  (FLEXERIL ) 5 MG tablet Take 1 tablet (5 mg total) by mouth 3 (three) times daily as needed for muscle spasms. 12/23/23  Yes Patel, Donika K, DO  dicyclomine  (BENTYL ) 10 MG capsule Take 1 capsule (10 mg total) by mouth 3 (three) times daily as needed. 01/24/24  Yes   empagliflozin  (JARDIANCE ) 10 MG TABS tablet Take 1 tablet (10 mg total) by mouth daily before breakfast. 05/19/24  Yes Cayman Brogden, Emil Schanz, MD  ibuprofen  (ADVIL ,MOTRIN ) 200 MG tablet Take 200 mg by mouth every 8 (eight) hours as needed.   Yes [provider]  levothyroxine  (UNITHROID ) 75 MCG tablet Take 1 tablet (75 mcg total) by mouth daily before breakfast. 07/08/24  Yes Murielle Stang, Emil Schanz, MD  magnesium 30 MG tablet Take 30 mg by mouth daily.   Yes [provider]  pantoprazole  (PROTONIX ) 20 MG tablet Take 1 tablet (20 mg total) by mouth before breakfast and before evening meal. 01/24/24  Yes   promethazine  (PHENERGAN ) 25 MG tablet Take 1 tablet (25 mg total) by mouth every 6 (six) hours as needed for nausea or vomiting.  04/08/24  Yes Jaffe, Adam R, DO  Rimegepant Sulfate  (NURTEC) 75 MG TBDP Take 1 tablet (75 mg total) by mouth every other day. 10/09/23  Yes Jaffe, Adam R, DO  Ubrogepant  (UBRELVY ) 100 MG TABS Take 1 tablet (100 mg) by mouth as needed. May repeat dose in 2 hours.  Maximum 2 tablets in 24 hours. 10/09/23  Yes Jaffe, Adam R, DO  valACYclovir  (VALTREX ) 1000 MG tablet Take 1 tablet (1,000 mg total) by mouth every 12 (twelve) hours for 5 days. 10/01/23  Yes   ezetimibe  (ZETIA ) 10 MG tablet Take 1 tablet (10 mg total) by mouth every evening. Patient not taking: Reported on 09/30/2024 07/04/24   Purcell Emil Schanz, MD    Allergies[1]  Patient Active Problem List   Diagnosis Date Noted   Dyslipidemia associated with type 2 diabetes mellitus (HCC) 05/19/2024   Overweight 02/17/2024   Chronic right shoulder pain 11/26/2023   Cervical radiculopathy 04/10/2022   Statin intolerance 03/08/2022   Family history of heart disease 11/15/2021   Obstructive sleep apnea 09/07/2021   Prediabetes 09/07/2021   Dyslipidemia 08/14/2021   Positive ANA (antinuclear antibody) 04/20/2021   Finger deformity 04/20/2021   Raynaud phenomenon 09/17/2019   Anxiety 07/30/2019   Chronic idiopathic constipation 01/08/2019   Psychophysiological insomnia 07/07/2018   Palpitations 04/16/2017   Intractable migraine without aura and without status migrainosus 07/02/2016   Pancreas divisum of native pancreas 12/27/2014  Right upper quadrant abdominal pain 12/21/2014   Uterine leiomyoma 09/26/2012   GERD (gastroesophageal reflux disease)    Hypothyroidism 08/12/2009    Past Medical History:  Diagnosis Date   ACID REFLUX DISEASE 01/03/2008   Anemia    ANEMIA, IRON DEFICIENCY 12/23/2008   history   Anxiety    Chronic tension type headache 01/13/2009   migraines   Complication of anesthesia    pt states she just needs a small amount or she will sleep too long   Dysplasia of cervix, low grade (CIN 1)    Fibroid    GERD  (gastroesophageal reflux disease)    HEART MURMUR, SYSTOLIC 07/07/2010   will have echo 06/18/2013   HYPOTHYROIDISM, POST-RADIATION 08/12/2009   Irritable bowel syndrome 12/23/2008   Migraine    Ovarian cyst    Ovarian cyst    THYROID  NODULE, RIGHT 04/21/2009    Past Surgical History:  Procedure Laterality Date   ABDOMINAL HYSTERECTOMY     COLONOSCOPY     HYSTEROSCOPY     LAPAROSCOPY     NOVASURE ABLATION     POLYPECTOMY     TUBAL LIGATION     UPPER GI ENDOSCOPY     VAGINAL HYSTERECTOMY N/A 06/24/2013   Procedure: TOTAL HYSTERECTOMY VAGINAL;  Surgeon: Nena DELENA App, MD;  Location: [facility];  Service: Gynecology;  Laterality: N/A;    Social History   Socioeconomic History   Marital status: Married    Spouse name: Alm   Number of children: 3   Years of education: Not on file   Highest education level: Associate degree: occupational, scientist, product/process development, or vocational program  Occupational History   Occupation: WOMENS    Employer: [employer]  Tobacco Use   Smoking status: Never    Passive exposure: Past   Smokeless tobacco: Never  Vaping Use   Vaping status: Never Used  Substance and Sexual Activity   Alcohol use: No   Drug use: No   Sexual activity: Yes    Birth control/protection: Surgical    Comment: BTL  Other Topics Concern   Not on file  Social History Narrative   Regular exercise-yes      Patient is right-handed.She lives with her husband in a 1 story home. She has recently been avoiding caffeine. Walks.   Right handed   Does work   Social Drivers of Health   Tobacco Use: Low Risk (09/30/2024)   Patient History    Smoking Tobacco Use: Never    Smokeless Tobacco Use: Never    Passive Exposure: Past  Financial Resource Strain: Low Risk (05/18/2024)   Overall Financial Resource Strain (CARDIA)    Difficulty of Paying Living Expenses: Not very hard  Food Insecurity: No Food Insecurity (05/18/2024)   Epic    Worried About Programme Researcher, Broadcasting/film/video in the Last Year:  Never true    Ran Out of Food in the Last Year: Never true  Transportation Needs: No Transportation Needs (05/18/2024)   Epic    Lack of Transportation (Medical): No    Lack of Transportation (Non-Medical): No  Physical Activity: Sufficiently Active (05/18/2024)   Exercise Vital Sign    Days of Exercise per Week: 2 days    Minutes of Exercise per Session: 90 min  Stress: Stress Concern Present (05/18/2024)   [employer] of Occupational Health - Occupational Stress Questionnaire    Feeling of Stress: To some extent  Social Connections: Socially Integrated (05/18/2024)   Social Connection and Isolation Panel  Frequency of Communication with Friends and Family: More than three times a week    Frequency of Social Gatherings with Friends and Family: Once a week    Attends Religious Services: More than 4 times per year    Active Member of [employer] or Organizations: Yes    Attends Banker Meetings: 1 to 4 times per year    Marital Status: Married  Catering Manager Violence: Not on file  Depression (PHQ2-9): Low Risk (09/30/2024)   Depression (PHQ2-9)    PHQ-2 Score: 3  Alcohol Screen: Not on file  Housing: Low Risk (05/18/2024)   Epic    Unable to Pay for Housing in the Last Year: No    Number of Times Moved in the Last Year: 0    Homeless in the Last Year: No  Utilities: Not on file  Health Literacy: Not on file    Family History  Problem Relation Age of Onset   Heart disease Mother    Thyroid  disease Mother    Heart attack Mother    Cancer Father    Heart disease Father    Heart attack Father    Throat cancer Father    Lung cancer Father    Alcoholism Father    Yvone' disease Sister    Asthma Sister    Hepatitis Sister    Stomach cancer Sister    CVA Brother    Alzheimer's disease Brother    Goiter Brother    Hypertension Brother    Hypertension Brother    Alcoholism Brother    Hyperlipidemia Maternal Aunt    Hypertension Paternal Grandfather    Healthy  Daughter    GER disease Son    GER disease Son    Goiter Other        Siblings 2   Depression Neg Hx    Alcohol abuse Neg Hx    Drug abuse Neg Hx    Early death Neg Hx    Hearing loss Neg Hx    Kidney disease Neg Hx    Stroke Neg Hx      Review of Systems  Constitutional:  Negative for chills and fever.  HENT: Negative.  Negative for congestion and ear pain.   Respiratory: Negative.  Negative for cough and shortness of breath.   Cardiovascular: Negative.  Negative for chest pain and palpitations.  Gastrointestinal:  Negative for abdominal pain, nausea and vomiting.  Genitourinary: Negative.  Negative for dysuria and hematuria.  Skin: Negative.  Negative for rash.  Neurological: Negative.  Negative for dizziness and headaches.  All other systems reviewed and are negative.   Vitals:   09/30/24 1554  BP: 134/72  Pulse: 92  Temp: 97.9 F (36.6 C)  SpO2: 99%    Physical Exam Vitals reviewed.  Constitutional:      Appearance: Normal appearance.  HENT:     Head: Normocephalic.     Right Ear: Tympanic membrane, ear canal and external ear normal.     Left Ear: There is impacted cerumen.  Eyes:     Extraocular Movements: Extraocular movements intact.  Neck:     Vascular: No carotid bruit.  Cardiovascular:     Rate and Rhythm: Normal rate and regular rhythm.     Pulses: Normal pulses.     Heart sounds: Normal heart sounds.  Pulmonary:     Effort: Pulmonary effort is normal.     Breath sounds: Normal breath sounds.  Skin:    General: Skin is warm and  dry.  Neurological:     Mental Status: She is alert and oriented to person, place, and time.  Psychiatric:        Mood and Affect: Mood normal.        Behavior: Behavior normal.      ASSESSMENT & PLAN: I personally spent a total of 30 minutes minutes in the care of the patient today including preparing to see the patient, getting/reviewing separately obtained history, performing a medically appropriate  exam/evaluation, counseling and educating, referring and communicating with other health care professionals, coordinating care, and need for ear irrigation, prognosis and need for follow-up if not better or worse during the next several days..  Problem List Items Addressed This Visit       Nervous and Auditory   Hearing loss of left ear due to cerumen impaction - Primary   Earwax removal management discussed Recommended to pick up earwax removal kit at a local pharmacy and irrigate ear as per instructions Advised to contact the office if no better or worse during the next several days May need referral to ENT      Patient Instructions  Earwax Buildup, Adult Your ears make something called earwax. It helps keep germs called bacteria away and protects the skin in your ears. Sometimes, too much earwax can build up. This can cause discomfort or make it harder to hear. What are the causes? Earwax buildup can happen when you have too much earwax in your ears. Earwax is made in the outer part of your ear canal. It's supposed to fall out in small amounts over time. But if your ears aren't able to clean themselves like they should, earwax can build up. What increases the risk? You're more likely to get earwax buildup if: You clean your ears with cotton swabs. You pick at your ears. You use earplugs or in-ear headphones a lot. You wear hearing aids. You may also be more likely to get it if: You're female. You're older. Your ears naturally make more earwax. You have narrow ear canals or extra hair in your ears. Your earwax is too thick or sticky. You have eczema. You're dehydrated. This means there's not enough fluid in your body. What are the signs or symptoms? Symptoms of earwax buildup include: Not being able to hear as well. A feeling of fullness in your ear. Feeling like your ear is plugged. Fluid coming from your ear. Ear pain or an itchy ear. Ringing in your ear. Coughing or  problems with balance. How is this diagnosed? Earwax buildup may be diagnosed based on your symptoms, medical history, and an ear exam. During the exam, your health care provider will look into your ear with a tool called an otoscope. You may also have tests, such as a hearing test. How is this treated? Earwax buildup may be treated by: Using ear drops. Having the earwax removed by a provider. The provider may: Flush the ear with water. Use a tool called a curette that has a loop on the end. Use a suction device. Having surgery. This may be done in severe cases. Follow these instructions at home:  Cleaning your ears Clean your ears as told by your provider. You can clean the outside of your ears with a washcloth or tissue. Do not overclean your ears. Do not put anything into your ear unless told. This includes cotton swabs. General instructions Take over-the-counter and prescription medicines only as told by your provider. Drink enough fluid to keep your pee (urine)  pale yellow. This helps thin the earwax. If you have hearing aids, clean them as told. Keep all follow-up visits. If earwax builds up in your ears often or if you use hearing aids, ask your provider how often you should have your ears cleaned. Contact a health care provider if: Your ear pain gets worse. You have a fever. You have pus, blood, or other fluid coming from your ear. You have hearing loss. You have ringing in your ears that won't go away. You feel like the room is spinning. This is called vertigo. Your symptoms don't get better with treatment. This information is not intended to replace advice given to you by your health care provider. Make sure you discuss any questions you have with your health care provider. Document Revised: 11/08/2022 Document Reviewed: 11/08/2022 Elsevier Patient Education  2024 Elsevier Inc.   Emil Schaumann, MD [facility]    [1]  Allergies Allergen  Reactions   Imitrex  [Sumatriptan ] Palpitations   Reglan  [Metoclopramide ] Palpitations   Rosuvastatin  Other (See Comments)    Arthralgia and myalgia   Propoxyphene N-Acetaminophen     Nabumetone Rash    REACTION: GI upset   Topiramate Rash    REACTION: rash

## 2024-09-30 NOTE — Assessment & Plan Note (Signed)
 Earwax removal management discussed Recommended to pick up earwax removal kit at a local pharmacy and irrigate ear as per instructions Advised to contact the office if no better or worse during the next several days May need referral to ENT

## 2024-09-30 NOTE — Patient Instructions (Signed)
 Earwax Buildup, Adult Your ears make something called earwax. It helps keep germs called bacteria away and protects the skin in your ears. Sometimes, too much earwax can build up. This can cause discomfort or make it harder to hear. What are the causes? Earwax buildup can happen when you have too much earwax in your ears. Earwax is made in the outer part of your ear canal. It's supposed to fall out in small amounts over time. But if your ears aren't able to clean themselves like they should, earwax can build up. What increases the risk? You're more likely to get earwax buildup if: You clean your ears with cotton swabs. You pick at your ears. You use earplugs or in-ear headphones a lot. You wear hearing aids. You may also be more likely to get it if: You're female. You're older. Your ears naturally make more earwax. You have narrow ear canals or extra hair in your ears. Your earwax is too thick or sticky. You have eczema. You're dehydrated. This means there's not enough fluid in your body. What are the signs or symptoms? Symptoms of earwax buildup include: Not being able to hear as well. A feeling of fullness in your ear. Feeling like your ear is plugged. Fluid coming from your ear. Ear pain or an itchy ear. Ringing in your ear. Coughing or problems with balance. How is this diagnosed? Earwax buildup may be diagnosed based on your symptoms, medical history, and an ear exam. During the exam, your health care provider will look into your ear with a tool called an otoscope. You may also have tests, such as a hearing test. How is this treated? Earwax buildup may be treated by: Using ear drops. Having the earwax removed by a provider. The provider may: Flush the ear with water. Use a tool called a curette that has a loop on the end. Use a suction device. Having surgery. This may be done in severe cases. Follow these instructions at home:  Cleaning your ears Clean your ears as told  by your provider. You can clean the outside of your ears with a washcloth or tissue. Do not overclean your ears. Do not put anything into your ear unless told. This includes cotton swabs. General instructions Take over-the-counter and prescription medicines only as told by your provider. Drink enough fluid to keep your pee (urine) pale yellow. This helps thin the earwax. If you have hearing aids, clean them as told. Keep all follow-up visits. If earwax builds up in your ears often or if you use hearing aids, ask your provider how often you should have your ears cleaned. Contact a health care provider if: Your ear pain gets worse. You have a fever. You have pus, blood, or other fluid coming from your ear. You have hearing loss. You have ringing in your ears that won't go away. You feel like the room is spinning. This is called vertigo. Your symptoms don't get better with treatment. This information is not intended to replace advice given to you by your health care provider. Make sure you discuss any questions you have with your health care provider. Document Revised: 11/08/2022 Document Reviewed: 11/08/2022 Elsevier Patient Education  2024 [employer].

## 2024-10-01 ENCOUNTER — Other Ambulatory Visit (HOSPITAL_COMMUNITY): Payer: Self-pay

## 2024-10-01 ENCOUNTER — Other Ambulatory Visit: Payer: Self-pay

## 2024-10-01 MED ORDER — NURTEC 75 MG PO TBDP
1.0000 | ORAL_TABLET | ORAL | 2 refills | Status: AC
  Filled 2024-10-01: qty 16, 32d supply, fill #0

## 2024-10-02 ENCOUNTER — Other Ambulatory Visit (HOSPITAL_COMMUNITY): Payer: Self-pay

## 2024-10-06 ENCOUNTER — Ambulatory Visit: Admitting: Cardiology

## 2024-10-06 NOTE — Progress Notes (Unsigned)
 " Cardiology Office Note   Date:  10/07/2024  ID:  Chelsey Hunt, Chelsey Hunt October 28, 1965, MRN [id number] PCP: Purcell Emil Schanz, MD  [facility] Providers Cardiologist:  None   History of Present Illness Chelsey Hunt is a 59 y.o. female with a past medical history significant for chest pain suggestive of musculoskeletal chest pain and also palpitations, PVCs and PACs here for follow-up appointment.  She was last seen over a year ago by Dr. Ladona and at that time she was having some left-sided chest pain which was lasting anywhere from 10 to 15 minutes and sometimes she felt the discomfort in her left arm as well.  She took Tylenol  and felt slightly better but ended up coming in for evaluation.  The 2 days leading up to her last appointment she had not had any recurrent chest pain.  She was asymptomatic.  Today, she presents for cardiovascular follow-up. She recently had a monitor done with her PCP back in November  She notes lower than usual blood pressure readings, at times in the 90s/60s compared with her usual 120s/60s, without lightheadedness or dizziness. She has been fasting and thinks this may be affecting her readings.  She has atypical chest pain that was felt to be non-cardiac and largely resolved after her thyroid  levels were regulated. She had palpitations that have markedly improved after her thyroid  medication was adjusted to 75 mcg, with only two brief episodes since.  Her LDL was 160 previously and 155 in the fall. She takes Zetia  10 mg daily. She stopped atorvastatin  due to a recall and could not tolerate rosuvastatin .  Her potassium was low-normal at 3.5 in September, and she has increased dietary potassium intake.  She works night shifts, which affects the timing of her cholesterol medication.  A heart monitor in November showed no dangerous arrhythmias, only rare PVCs and one brief episode of SVT. Her last echocardiogram in 2023 was stable, and she has not  had symptoms that would prompt a repeat study  Reports no shortness of breath nor dyspnea on exertion. Reports no chest pain, pressure, or tightness. No edema, orthopnea, PND.   Discussed the use of AI scribe software for clinical note transcription with the patient, who gave verbal consent to proceed.   ROS: pertinent ROS in HPI  Studies Reviewed      CORONARY CALCIUM  SCORES 01/12/2022:  Left Main: 0  LAD: 0  LCx: 0  RCA: 0  Total Agatston Score: 0  MESA database percentile: 0    Ascending Aorta: 24 mm  Descending Aorta: 19 mm   OTHER FINDINGS: Heart is normal size. Aorta normal caliber. Single punctate calcification seen in the distal aortic arch. No adenopathy. No confluent opacities or effusions. No acute findings in the upper abdomen. Chest wall soft tissues are unremarkable. No acute bony abnormality.   IMPRESSION: No visible coronary artery calcifications. Total coronary calcium  score of 0. Single calcification seen in the distal aortic arch. No acute extra cardiac abnormality.   Echocardiogram 12/21/2021:  Normal LV systolic function with visual EF 60-65%. Left ventricle cavity is normal in size. Normal left ventricular wall thickness. Normal global wall motion. Normal diastolic filling pattern, normal LAP.  No significant valvular abnormalities.  Echo density noted within the right atrium in the RV inflow and subcostal view (DDx prominent eustachian valve, artifact due to echogenic tricuspid annulus, etc). Images attached.  Consider either transesophageal echocardiogram, cardiac MRI, or limited echo with Definity for further evaluation. Clinical correlation needed.  No prior  study for comparison.   Exercise treadmill stress test 01/19/2022: Exercise treadmill stress test performed using Bruce protocol.  Patient reached 10.3 METS, and 90% of age predicted maximum heart rate.  Exercise capacity was excellent. No chest pain reported.  Normal heart rate and hemodynamic  response. Stress EKG revealed no ischemic changes. Low risk study.       Physical Exam VS:  BP 91/65   Pulse 81   Ht 5' (1.524 m)   Wt 135 lb (61.2 kg)   LMP 04/19/2013   SpO2 99%   BMI 26.37 kg/m        Wt Readings from Last 3 Encounters:  10/07/24 135 lb (61.2 kg)  09/30/24 137 lb (62.1 kg)  08/27/24 142 lb 9.6 oz (64.7 kg)    GEN: Well nourished, well developed in no acute distress NECK: No JVD; No carotid bruits CARDIAC: RRR, + soft systolic murmurs, rubs, gallops RESPIRATORY:  Clear to auscultation without rales, wheezing or rhonchi  ABDOMEN: Soft, non-tender, non-distended EXTREMITIES:  No edema; No deformity   ASSESSMENT AND PLAN  Palpitations with rare premature ventricular contractions (PVCs) Palpitations resolved with thyroid  regulation. Recent monitor showed rare PVCs, no atrial fibrillation. Symptoms sporadic, not concerning unless frequent. - Monitor symptoms, report if frequent or concerning. - Consider repeating echocardiogram if symptoms increase.  Hypercholesterolemia LDL remains elevated at 155 mg/dL. Zetia  10 mg daily ongoing. Discussed Nexlizet as non-statin option pending authorization. Injectable options discussed but less preferred. - Recheck lipid panel to assess control on Zetia . - Consider Nexlizet pending authorization and patient decision. - Provided literature on Nexlizet.  Hypothyroidism Thyroid  levels stabilized on 75 mcg dose, resolving palpitations. - Continue thyroid  medication at 75 mcg. -dosing adjustments per PCP     Dispo: She can return in a year to see Dr. Ladona  Signed, Orren LOISE Fabry, PA-C   "

## 2024-10-07 ENCOUNTER — Encounter: Payer: Self-pay | Admitting: Physician Assistant

## 2024-10-07 ENCOUNTER — Ambulatory Visit: Admitting: Physician Assistant

## 2024-10-07 VITALS — BP 91/65 | HR 81 | Ht 60.0 in | Wt 135.0 lb

## 2024-10-07 DIAGNOSIS — E7800 Pure hypercholesterolemia, unspecified: Secondary | ICD-10-CM

## 2024-10-07 DIAGNOSIS — R002 Palpitations: Secondary | ICD-10-CM

## 2024-10-07 DIAGNOSIS — R0789 Other chest pain: Secondary | ICD-10-CM

## 2024-10-07 DIAGNOSIS — E559 Vitamin D deficiency, unspecified: Secondary | ICD-10-CM

## 2024-10-07 NOTE — Patient Instructions (Signed)
 Medication Instructions:  Your physician recommends that you continue on your current medications as directed. Please refer to the Current Medication list given to you today. *If you need a refill on your cardiac medications before your next appointment, please call your pharmacy*  Lab Work: None ordered If you have labs (blood work) drawn today and your tests are completely normal, you will receive your results only by: MyChart Message (if you have MyChart) OR A paper copy in the mail If you have any lab test that is abnormal or we need to change your treatment, we will call you to review the results.  Testing/Procedures: None ordered  Follow-Up: At [facility], you and your health needs are our priority.  As part of our continuing mission to provide you with exceptional heart care, our providers are all part of one team.  This team includes your primary Cardiologist (physician) and Advanced Practice Providers or APPs (Physician Assistants and Nurse Practitioners) who all work together to provide you with the care you need, when you need it.  Your next appointment:   1 year(s)  Provider:   Gordy Bergamo, MD   We recommend signing up for the patient portal called MyChart.  Sign up information is provided on this After Visit Summary.  MyChart is used to connect with patients for Virtual Visits (Telemedicine).  Patients are able to view lab/test results, encounter notes, upcoming appointments, etc.  Non-urgent messages can be sent to your provider as well.   To learn more about what you can do with MyChart, go to [email or web address].   Other Instructions  Please check your blood pressure daily for two weeks, then contact the office with your readings  Please contact the office with your readings either by phone, by dropping it off in person, or by sending it through MyChart.   Be sure to check your blood pressure one to two hours after taking your medications.  Avoid the  following for 30 minutes before checking your blood pressure: No caffeine No alcohol No eating No smoking  No exercise  Five minutes before checking your blood pressure: Use the restroom Sit up straight in a chair with your back supported and feet flat on the floor Remain quiet and do not talk

## 2024-10-08 ENCOUNTER — Other Ambulatory Visit (HOSPITAL_COMMUNITY): Payer: Self-pay

## 2024-10-08 MED ORDER — VALACYCLOVIR HCL 1 G PO TABS
1000.0000 mg | ORAL_TABLET | Freq: Two times a day (BID) | ORAL | 0 refills | Status: AC
  Filled 2024-10-08: qty 30, 15d supply, fill #0

## 2024-11-25 ENCOUNTER — Encounter: Admitting: Dietician

## 2024-12-07 ENCOUNTER — Ambulatory Visit: Admitting: Neurology

## 2024-12-17 ENCOUNTER — Ambulatory Visit: Admitting: Cardiology

## 2025-05-19 ENCOUNTER — Encounter: Admitting: Emergency Medicine
